# Patient Record
Sex: Female | Born: 1940 | Race: White | Hispanic: No | Marital: Married | Smoking: Never smoker
Health system: Southern US, Community
[De-identification: ages and names within clinical notes are randomized; demographics above are authoritative.]

## PROBLEM LIST (undated history)

## (undated) DIAGNOSIS — J189 Pneumonia, unspecified organism: Secondary | ICD-10-CM

## (undated) DIAGNOSIS — H9190 Unspecified hearing loss, unspecified ear: Secondary | ICD-10-CM

## (undated) DIAGNOSIS — E785 Hyperlipidemia, unspecified: Secondary | ICD-10-CM

## (undated) DIAGNOSIS — T7840XA Allergy, unspecified, initial encounter: Secondary | ICD-10-CM

## (undated) DIAGNOSIS — H3530 Unspecified macular degeneration: Secondary | ICD-10-CM

## (undated) DIAGNOSIS — R109 Unspecified abdominal pain: Secondary | ICD-10-CM

## (undated) DIAGNOSIS — C50919 Malignant neoplasm of unspecified site of unspecified female breast: Secondary | ICD-10-CM

## (undated) DIAGNOSIS — M8580 Other specified disorders of bone density and structure, unspecified site: Secondary | ICD-10-CM

## (undated) DIAGNOSIS — J45909 Unspecified asthma, uncomplicated: Secondary | ICD-10-CM

## (undated) DIAGNOSIS — M1990 Unspecified osteoarthritis, unspecified site: Secondary | ICD-10-CM

## (undated) HISTORY — PX: BREAST SURGERY: SHX581

## (undated) HISTORY — DX: Unspecified macular degeneration: H35.30

## (undated) HISTORY — DX: Unspecified hearing loss, unspecified ear: H91.90

## (undated) HISTORY — PX: MASTECTOMY: SHX3

## (undated) HISTORY — DX: Unspecified abdominal pain: R10.9

## (undated) HISTORY — PX: BREAST ENHANCEMENT SURGERY: SHX7

## (undated) HISTORY — DX: Pneumonia, unspecified organism: J18.9

## (undated) HISTORY — DX: Hyperlipidemia, unspecified: E78.5

## (undated) HISTORY — PX: ABDOMINAL HYSTERECTOMY: SHX81

## (undated) HISTORY — DX: Other specified disorders of bone density and structure, unspecified site: M85.80

## (undated) HISTORY — DX: Malignant neoplasm of unspecified site of unspecified female breast: C50.919

## (undated) HISTORY — DX: Allergy, unspecified, initial encounter: T78.40XA

## (undated) HISTORY — PX: EYE SURGERY: SHX253

## (undated) HISTORY — DX: Unspecified asthma, uncomplicated: J45.909

---

## 1999-01-22 ENCOUNTER — Encounter: Admission: RE | Admit: 1999-01-22 | Discharge: 1999-01-22 | Payer: Self-pay | Admitting: Family Medicine

## 1999-01-22 ENCOUNTER — Encounter: Payer: Self-pay | Admitting: Family Medicine

## 2002-03-12 ENCOUNTER — Encounter: Admission: RE | Admit: 2002-03-12 | Discharge: 2002-03-12 | Payer: Self-pay | Admitting: Family Medicine

## 2002-03-12 ENCOUNTER — Encounter: Payer: Self-pay | Admitting: Family Medicine

## 2003-05-29 ENCOUNTER — Ambulatory Visit (HOSPITAL_COMMUNITY): Admission: RE | Admit: 2003-05-29 | Discharge: 2003-05-29 | Payer: Self-pay | Admitting: Gastroenterology

## 2003-09-01 ENCOUNTER — Encounter: Admission: RE | Admit: 2003-09-01 | Discharge: 2003-09-01 | Payer: Self-pay | Admitting: Family Medicine

## 2004-04-09 ENCOUNTER — Encounter: Admission: RE | Admit: 2004-04-09 | Discharge: 2004-04-09 | Payer: Self-pay | Admitting: Family Medicine

## 2006-05-05 ENCOUNTER — Encounter: Admission: RE | Admit: 2006-05-05 | Discharge: 2006-05-05 | Payer: Self-pay | Admitting: Family Medicine

## 2006-05-26 ENCOUNTER — Encounter: Admission: RE | Admit: 2006-05-26 | Discharge: 2006-05-26 | Payer: Self-pay | Admitting: Family Medicine

## 2007-05-02 ENCOUNTER — Encounter: Admission: RE | Admit: 2007-05-02 | Discharge: 2007-05-02 | Payer: Self-pay | Admitting: Family Medicine

## 2010-02-05 ENCOUNTER — Ambulatory Visit (HOSPITAL_COMMUNITY): Admission: RE | Admit: 2010-02-05 | Discharge: 2010-02-05 | Payer: Self-pay | Admitting: Family Medicine

## 2010-02-22 ENCOUNTER — Encounter: Payer: Self-pay | Admitting: Pulmonary Disease

## 2010-03-10 DIAGNOSIS — G609 Hereditary and idiopathic neuropathy, unspecified: Secondary | ICD-10-CM | POA: Insufficient documentation

## 2010-03-10 DIAGNOSIS — M949 Disorder of cartilage, unspecified: Secondary | ICD-10-CM

## 2010-03-10 DIAGNOSIS — J309 Allergic rhinitis, unspecified: Secondary | ICD-10-CM | POA: Insufficient documentation

## 2010-03-10 DIAGNOSIS — M899 Disorder of bone, unspecified: Secondary | ICD-10-CM | POA: Insufficient documentation

## 2010-03-10 DIAGNOSIS — J4540 Moderate persistent asthma, uncomplicated: Secondary | ICD-10-CM

## 2010-03-10 DIAGNOSIS — H3530 Unspecified macular degeneration: Secondary | ICD-10-CM | POA: Insufficient documentation

## 2010-03-10 DIAGNOSIS — E785 Hyperlipidemia, unspecified: Secondary | ICD-10-CM

## 2010-03-10 DIAGNOSIS — C50919 Malignant neoplasm of unspecified site of unspecified female breast: Secondary | ICD-10-CM | POA: Insufficient documentation

## 2010-03-11 ENCOUNTER — Ambulatory Visit: Payer: Self-pay | Admitting: Pulmonary Disease

## 2010-03-11 DIAGNOSIS — J189 Pneumonia, unspecified organism: Secondary | ICD-10-CM | POA: Insufficient documentation

## 2010-04-02 ENCOUNTER — Encounter: Payer: Self-pay | Admitting: Pulmonary Disease

## 2010-04-02 ENCOUNTER — Encounter
Admission: RE | Admit: 2010-04-02 | Discharge: 2010-04-02 | Payer: Self-pay | Source: Home / Self Care | Attending: Family Medicine | Admitting: Family Medicine

## 2010-04-17 ENCOUNTER — Encounter: Payer: Self-pay | Admitting: Family Medicine

## 2010-04-29 NOTE — Assessment & Plan Note (Signed)
Summary: ASTHMA//TD   Visit Type:  Initial Consult Copy to:  Dr. [name] Primary Provider/Referring Provider:  [name]  CC:  Pulmonary consult....  History of Present Illness: 70 yo female for evaluation of asthma.  She has noticed a problem with a cough since the time she was married.  She was previously told she had the lungs of an old woman.  She had some type of testing and therapy (she does not recall what exactly), and was better for a while.  She was also seen by Dr. Stevphen Rochester for allergies, and was getting allergy shots, but these didn't seem to help.  She has always had trouble with allergies.  She went on a trip to [place], and used nasonex for her sinuses.  This helped quite a bit, and she subsequently stopped allergy shots.  She is no longer seeing Dr. Corinda Gubler.  Her typical pattern is that her allergy symptoms are worse in the Fall.  For the past 10 years she has developed a respiratory infection in the fall.  She has been told she had pneumonia several times.  She was on a trip to the [place] in 2007 and developed a respiratory infection.  She was treated with prednisone and antibiotics, and improved.  She was treated for pneumonia in November 2011.  She was treated with prednisone and antibiotics, and has since improved.  She still has some sore throat, and hasn't fully recovered her energy level.  She will occasional get chest tightness and cough with milky colored sputum.  She denies fever or hemoptysis.  She has not been wheezing much since she took prednisone.  She has been using proair several times per day since she developed pneumonia in November.  She is only using her Qvar once per day.  She uses singulair, astepro, and flonase on a regular basis.  She uses loratadine and nasal irrigation intermittently.  She denies any history of cigarette smoking.  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  She has allergies to cats.  She does not have any  recent animal exposures, or sick exposures.  She has lived in [place] her whole life, and denies recent travel.  No prior history of TB.  She had  pneumonia shot about 5 years ago.  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  Preventive Screening-Counseling & Management  Alcohol-Tobacco     Alcohol drinks/day: 0     Smoking Status: never  Current Medications (verified): 1)  Multivitamins  Tabs (Multiple Vitamin) .Marland Kitchen.. 1 By Mouth Daily 2)  Vitamin D 1000 Unit Tabs (Cholecalciferol) .Marland Kitchen.. 1 By Mouth Daily 3)  Tums E-X 750 750 Mg Chew (Calcium Carbonate Antacid) .Marland Kitchen.. 1 By Mouth Rpn 4)  Fish Oil 1000 Mg Caps (Omega-3 Fatty Acids) .Marland Kitchen.. 1 By Mouth Daily 5)  Ocuvite-Lutein  Caps (Multiple Vitamins-Minerals) .... Daily As Directed 6)  Qvar 80 Mcg/act Aers (Beclomethasone Dipropionate) .Marland Kitchen.. 1 Puff At Bedtime 7)  Singulair 10 Mg Tabs (Montelukast Sodium) .Marland Kitchen.. 1 By Mouth At Bedtime 8)  Fluticasone Propionate 50 Mcg/act Susp (Fluticasone Propionate) .Marland Kitchen.. 1 Spray in Each Nostril At Bedtime 9)  Loratadine 10 Mg Tabs (Loratadine) .Marland Kitchen.. 1 By Mouth Daily 10)  Astepro 0.15 % Soln (Azelastine Hcl) .Marland Kitchen.. 1 Spray in Each Nostril At Bedtime 11)  Mucinex 600 Mg Xr12h-Tab (Guaifenesin) .Marland Kitchen.. 1 By Mouth Two Times A Day 12)  Proair Hfa 108 (90 Base) Mcg/act Aers (Albuterol Sulfate) .Marland Kitchen.. 1 Puff Two Times A Day  Allergies (verified): 1)  ! Sulfa  Past History:  Past Medical History: Asthma Allergic rhinits Osteopenia Breast cancer 1982 Macular degeneration Hyperlipidemia  Past Surgical History: Mastectomy, bilateral C-section x two  TAH with BSO  Family History: Father - CAD Paternal Aunt - Breast Cancer  Social History: Married, two children.  Retired Runner, broadcasting/film/video.  Never smoked.Alcohol drinks/day:  0 Smoking Status:  never  Review of Systems       The patient complains of shortness of breath with activity, shortness of breath at rest, productive cough, indigestion, and sore throat.  The  patient denies non-productive cough, coughing up blood, chest pain, irregular heartbeats, acid heartburn, loss of appetite, weight change, abdominal pain, difficulty swallowing, tooth/dental problems, headaches, nasal congestion/difficulty breathing through nose, sneezing, itching, ear ache, anxiety, depression, hand/feet swelling, joint stiffness or pain, rash, change in color of mucus, and fever.    Vital Signs:  Patient profile:   70 year old female Height:      60 inches (152.40 cm) Weight:      139 pounds (63.18 kg) BMI:     27.24 O2 Sat:      95 % on Room air Temp:     98.0 degrees F (36.67 degrees C) oral Pulse rate:   96 / minute BP sitting:   124 / 74  (left arm) Cuff size:   regular  Vitals Entered By: Michel Bickers CMA (March 11, 2010 9:57 AM)  O2 Flow:  Room air CC: Pulmonary consult... Is Patient Diabetic? No Comments Medications reviewed with patient Michel Bickers [facility]  March 11, 2010 9:58 AM   Physical Exam  General:  normal appearance and healthy appearing.   Eyes:  PERRLA and EOMI, wears glasses Ears:  TMs intact and clear with normal canals Nose:  clear nasal discharge.   Mouth:  MP 3, scalloped tongue Neck:  no JVD.   Chest Wall:  b/l mastectomy Lungs:  clear bilaterally to auscultation and percussion Heart:  regular rate and rhythm, S1, S2 without murmurs, rubs, gallops, or clicks Abdomen:  bowel sounds positive; abdomen soft and non-tender without masses, or organomegaly Msk:  no deformity or scoliosis noted with normal posture Pulses:  pulses normal Extremities:  no clubbing, cyanosis, edema, or deformity noted Neurologic:  normal CN II-XII and strength normal.   Skin:  intact without lesions or rashes Cervical Nodes:  no significant adenopathy Psych:  alert and cooperative; normal mood and affect; normal attention span and concentration   Impression & Recommendations:  Problem # 1:  ASTHMA (ICD-493.90) She has a long standing history of asthma  related to allergies.  She was treated for a recent exacerbation, and has improved.    I have advised her to use Qvar one puff two times a day.  She is to continue her other asthma regimen.  I don't think she needs additional prednisone or antibiotics at this time.  I reviewed for her the difference between her inhalers, and the proper role for each.  Also, now that she has recovered from her acute illness, I have administered flu vaccine today.  Will also need to have pulmonary function testing done at her next visit to establish baseline lung function.  Problem # 2:  ALLERGIC RHINITIS (ICD-477.9) She is stable on her current regimen.  Problem # 3:  PNEUMONIA, ORGANISM UNSPECIFIED (ICD-486)  She was recently treated for pneumonia with antibiotics and prednisone.  She has improved clinically.  She is due to have f/u chest xray with primary care.  I have asked that a copy of her xray for my review.  Medications Added to Medication List This Visit: 1)  Qvar 80 Mcg/act Aers (Beclomethasone dipropionate) .... One puff two times a day 2)  Proair Hfa 108 (90 Base) Mcg/act Aers (Albuterol sulfate) .Marland Kitchen.. 1 puff two times a day 3)  Fluticasone Propionate 50 Mcg/act Susp (Fluticasone propionate) .Marland Kitchen.. 1 spray in each nostril at bedtime 4)  Astepro 0.15 % Soln (Azelastine hcl) .Marland Kitchen.. 1 spray in each nostril at bedtime 5)  Mucinex 600 Mg Xr12h-tab (Guaifenesin) .Marland Kitchen.. 1 by mouth two times a day  Complete Medication List: 1)  Qvar 80 Mcg/act Aers (Beclomethasone dipropionate) .... One puff two times a day 2)  Singulair 10 Mg Tabs (Montelukast sodium) .Marland Kitchen.. 1 by mouth at bedtime 3)  Proair Hfa 108 (90 Base) Mcg/act Aers (Albuterol sulfate) .Marland Kitchen.. 1 puff two times a day 4)  Fluticasone Propionate 50 Mcg/act Susp (Fluticasone propionate) .Marland Kitchen.. 1 spray in each nostril at bedtime 5)  Astepro 0.15 % Soln (Azelastine hcl) .Marland Kitchen.. 1 spray in each nostril at bedtime 6)  Loratadine 10 Mg Tabs (Loratadine) .Marland Kitchen.. 1 by mouth  daily 7)  Multivitamins Tabs (Multiple vitamin) .Marland Kitchen.. 1 by mouth daily 8)  Vitamin D 1000 Unit Tabs (Cholecalciferol) .Marland Kitchen.. 1 by mouth daily 9)  Tums E-x 750 750 Mg Chew (Calcium carbonate antacid) .Marland Kitchen.. 1 by mouth rpn 10)  Fish Oil 1000 Mg Caps (Omega-3 fatty acids) .Marland Kitchen.. 1 by mouth daily 11)  Ocuvite-lutein Caps (Multiple vitamins-minerals) .... Daily as directed 12)  Mucinex 600 Mg Xr12h-tab (Guaifenesin) .Marland Kitchen.. 1 by mouth two times a day  Other Orders: Consultation Level IV (16109) Influenza Vaccine MCR (60454)  Patient Instructions: 1)  Qvar one puff two times a day, and rinse mouth after using 2)  Ask Dr. Clelia Croft to send copy of follow up chest xray 3)  Follow up in 4 months   Immunization History:  Pneumovax Immunization History:    Pneumovax:  historical (03/28/2004)  Immunizations Administered:  Influenza Vaccine # 1:    Vaccine Type: Fluvax MCR    Site: right deltoid    Mfr: GlaxoSmithKline    Dose: 0.5 ml    Route: IM    Given by: Michel Bickers CMA    Exp. Date: 09/25/2010    Lot #: [id number]  Flu Vaccine Consent Questions:    Do you have a history of severe allergic reactions to this vaccine? no    Any prior history of allergic reactions to egg and/or gelatin? no    Do you have a sensitivity to the preservative Thimersol? no    Do you have a past history of Guillan-Barre Syndrome? no    Do you currently have an acute febrile illness? no    Have you ever had a severe reaction to latex? no    Vaccine information given and explained to patient? yes    Are you currently pregnant? no

## 2010-04-29 NOTE — Letter (Signed)
Summary: [name] at [facility] at [facility]   Imported By: Lester [place] 03/25/2010 12:22:01  _____________________________________________________________________  External Attachment:    Type:   Image     Comment:   External Document

## 2010-08-13 NOTE — Op Note (Signed)
NAMEAMAYRANY, CAFARO ANN                             ACCOUNT NO.:  [id number]   MEDICAL RECORD NO.:  [id number]                   PATIENT TYPE:  AMB   LOCATION:  ENDO                                 FACILITY:  [facility]   PHYSICIAN:  Danise Edge, M.D.                DATE OF BIRTH:  01-24-1941   DATE OF PROCEDURE:  05/29/2003  DATE OF DISCHARGE:                                 OPERATIVE REPORT   PROCEDURE:  Screening colonoscopy.   INDICATIONS:  Mrs. Mickayla Trouten is a 70 year old female, born March 10, 1941.  Mrs. Mccaffery is scheduled to undergo her first screening colonoscopy  with polypectomy to prevent colon cancer.   ENDOSCOPIST:  Danise Edge, M.D.   PREMEDICATION:  Versed 6 mg, Demerol 60 mg.   DESCRIPTION OF PROCEDURE:  After obtaining informed consent, Mrs. Landess was  placed in the left lateral decubitus position.  I administered intravenous  Demerol and intravenous Versed to achieve conscious sedation for the  procedure.  The patient's blood pressure, oxygen saturation and cardiac  rhythm were monitored throughout the procedure and documented in the medical  record.   Anal inspection was normal.  Digital rectal exam was normal.  The Olympus  adjustable pediatric  colonoscope was introduced into the rectum and  advanced to the cecum.  Colonic preparation for the exam today was  excellent.  Rectum:  Normal.  Sigmoid colon and descending colon:  Normal.  Splenic flexure:  Normal.  Transverse colon:  Normal.  Hepatic flexure:  Normal.  Ascending colon:  Normal.  Cecum and ileocecal valve:  Normal.   ASSESSMENT:  Normal screening proctocolonoscopy to the cecum.  No endoscopic  evidence for the presence of colorectal neoplasia.                                               Danise Edge, M.D.    MJ/MEDQ  D:  05/29/2003  T:  05/29/2003  Job:  [id number]   cc:   Donia Guiles, M.D.  [address] [place]  [place] [address]  Fax: [phone]

## 2010-10-24 ENCOUNTER — Emergency Department (HOSPITAL_COMMUNITY): Payer: Medicare HMO

## 2010-10-24 ENCOUNTER — Emergency Department (HOSPITAL_COMMUNITY)
Admission: EM | Admit: 2010-10-24 | Discharge: 2010-10-24 | Disposition: A | Discharge status: Home / Self Care | Payer: Medicare HMO | Attending: Emergency Medicine | Admitting: Emergency Medicine

## 2010-10-24 DIAGNOSIS — W172XXA Fall into hole, initial encounter: Secondary | ICD-10-CM | POA: Insufficient documentation

## 2010-10-24 DIAGNOSIS — S82899A Other fracture of unspecified lower leg, initial encounter for closed fracture: Secondary | ICD-10-CM | POA: Insufficient documentation

## 2010-10-24 DIAGNOSIS — S82409A Unspecified fracture of shaft of unspecified fibula, initial encounter for closed fracture: Secondary | ICD-10-CM | POA: Insufficient documentation

## 2011-10-18 ENCOUNTER — Other Ambulatory Visit: Payer: Self-pay | Admitting: Family Medicine

## 2011-10-18 ENCOUNTER — Ambulatory Visit
Admission: RE | Admit: 2011-10-18 | Discharge: 2011-10-18 | Disposition: A | Discharge status: Home / Self Care | Payer: Medicare HMO | Source: Ambulatory Visit | Attending: Family Medicine | Admitting: Family Medicine

## 2011-10-18 DIAGNOSIS — R042 Hemoptysis: Secondary | ICD-10-CM

## 2011-11-14 ENCOUNTER — Other Ambulatory Visit: Payer: Self-pay | Admitting: Family Medicine

## 2011-11-14 DIAGNOSIS — Z1231 Encounter for screening mammogram for malignant neoplasm of breast: Secondary | ICD-10-CM

## 2011-12-06 ENCOUNTER — Ambulatory Visit
Admission: RE | Admit: 2011-12-06 | Discharge: 2011-12-06 | Disposition: A | Discharge status: Home / Self Care | Payer: Medicare HMO | Source: Ambulatory Visit | Attending: Family Medicine | Admitting: Family Medicine

## 2011-12-06 DIAGNOSIS — Z1231 Encounter for screening mammogram for malignant neoplasm of breast: Secondary | ICD-10-CM

## 2011-12-07 ENCOUNTER — Ambulatory Visit (INDEPENDENT_AMBULATORY_CARE_PROVIDER_SITE_OTHER): Payer: Medicare HMO | Admitting: Pulmonary Disease

## 2011-12-07 ENCOUNTER — Encounter: Payer: Self-pay | Admitting: Pulmonary Disease

## 2011-12-07 VITALS — BP 112/70 | HR 95 | Temp 98.0°F | Ht 60.0 in | Wt 140.8 lb

## 2011-12-07 DIAGNOSIS — J45909 Unspecified asthma, uncomplicated: Secondary | ICD-10-CM

## 2011-12-07 DIAGNOSIS — R0600 Dyspnea, unspecified: Secondary | ICD-10-CM

## 2011-12-07 DIAGNOSIS — R0609 Other forms of dyspnea: Secondary | ICD-10-CM

## 2011-12-07 DIAGNOSIS — Z23 Encounter for immunization: Secondary | ICD-10-CM

## 2011-12-07 NOTE — Progress Notes (Signed)
Chief Complaint  Patient presents with  . Follow-up    Pt c/o SOB with exertion and bending over x 2weeks. Pt states that she has gained about 5lbs in the last 3 months. Wants to know if this could have anything to do with her symptoms.   CC: [name]  History of Present Illness: [name] is a 71 y.o. female with asthma.  I last saw her in December 2011.  She has noticed more trouble with her breathing over the past several weeks.  This only happens with exertion.  She also notices a rubber band like feeling in her chest when she gets short of breath.  She has been using proair twice per day, and tried a course of prednisone.  Neither of these made much difference with her breathing.  She continues to use Qvar one puff twice per day, and singulair at night.  She is not having cough, wheeze, sputum, sinus congestion, or sore throat.  She does not feel like her current symptoms fit with her usual pattern for asthma flare.   Past Medical History  Diagnosis Date  . Cancer of breast     33 years ago  . Asthma   . Macular degeneration   . Hearing loss     Past Surgical History  Procedure Date  . Mastectomy     bilateral  . Abdominal hysterectomy   . Cesarean section     x 2  . Breast enhancement surgery     Outpatient Encounter Prescriptions as of 12/07/2011  Medication Sig Dispense Refill  . albuterol (PROVENTIL HFA;VENTOLIN HFA) 108 (90 BASE) MCG/ACT inhaler Inhale 2 puffs into the lungs every 6 (six) hours as needed.      Marland Kitchen azelastine (ASTELIN) 137 MCG/SPRAY nasal spray Place 1 spray into the nose 2 (two) times daily. Use in each nostril as directed      . beclomethasone (QVAR) 80 MCG/ACT inhaler Inhale 2 puffs into the lungs as needed.      . calcium carbonate (TUMS) 500 MG chewable tablet Chew 1 tablet by mouth daily.      . Cholecalciferol (VITAMIN D) 1000 UNITS capsule Take 1,000 Units by mouth daily.      . fexofenadine (ALLEGRA) 180 MG tablet Take 180 mg by mouth  daily.      . fish oil-omega-3 fatty acids 1000 MG capsule Take 1,000 mg by mouth daily.      Marland Kitchen FLUTICASONE PROPIONATE NA Place 2 sprays into the nose daily.      . montelukast (SINGULAIR) 10 MG tablet Take 10 mg by mouth at bedtime.      . Multiple Vitamin (MULTIVITAMIN WITH MINERALS) TABS Take 1 tablet by mouth daily.      . raloxifene (EVISTA) 60 MG tablet Take 60 mg by mouth daily.        Allergies  Allergen Reactions  . Sulfonamide Derivatives     REACTION: rash    Physical Exam:  Filed Vitals:   12/07/11 1512  BP: 112/70  Pulse: 95  Temp: 98 F (36.7 C)  TempSrc: Oral  Height: 5' (1.524 m)  Weight: 140 lb 12.8 oz (63.866 kg)  SpO2: 95%    Current Encounter SPO2  12/07/11 1512 95%  03/11/10 0925 95%     Body mass index is 27.50 kg/(m^2). Wt Readings from Last 2 Encounters:  12/07/11 140 lb 12.8 oz (63.866 kg)  03/11/10 139 lb (63.05 kg)    General - No distress ENT -  TM clear, no sinus tenderness, no oral exudate, no LAN, no thyromegaly Cardiac - s1s2 regular, no murmur, pulses symmetric, no edema Chest - normal respiratory excursion, good air entry, no wheeze/rales/dullness Back - no focal tenderness Abd - soft, non-tender, no organomegaly, + bowel sounds Ext - normal motor strength Neuro - Cranial nerves are normal. PERLA. EOM's intact. Skin - no discernible active dermatitis, erythema, urticaria or inflammatory process. Psych - normal mood, and behavior.  CHEST - 2 VIEW 10/18/11 Comparison: Chest x-ray 04/02/2010.  Findings: Areas of architectural distortion are again noted in the lingula and right middle lobe (similar to prior) with some evidence of chronic volume loss in the right middle lobe and lingula; these findings partially obscure portions of the right the left cardiac border. No acute consolidative airspace disease. No pleural effusions. Pulmonary vasculature and the cardiomediastinal silhouette are within normal limits. Atherosclerotic  calcifications are noted within the arch of the aorta.  IMPRESSION:  1. Chronic scarring and volume loss in the right middle lobe and lingula similar to prior examinations. No definite radiographic evidence of acute cardiopulmonary disease.  2. Atherosclerosis  Original Report Authenticated By: Florencia Reasons, M.D.  Spirometry 12/07/11>>1.31 (71%), FEV1% 66  Exhaled nitric oxide 12/07/11>>8 ppb (normal)  Assessment/Plan:  Kendra Helling, MD [facility] Pager:  [phone] 12/07/2011, 3:31 PM

## 2011-12-07 NOTE — Assessment & Plan Note (Signed)
She has recent onset of exertional dyspnea with chest discomfort.  This is not her stereotypical pattern for asthma exacerbation.  She has not noticed improvement with prednisone or inhaler therapy.  Normal exhaled nitric oxide also speaks against asthma as a cause of her symptoms.  I have advised her to discuss with her PCP about whether she needs additional cardiac evaluation.  She does have family history of CAD.

## 2011-12-07 NOTE — Patient Instructions (Signed)
Flu shot today Speak with Dr. Clelia Croft about whether you need an evaluation of your heart function Follow up in 6 weeks

## 2011-12-07 NOTE — Assessment & Plan Note (Signed)
Continue her current asthma regimen for now.

## 2011-12-16 ENCOUNTER — Encounter: Payer: Self-pay | Admitting: Pulmonary Disease

## 2012-01-19 ENCOUNTER — Encounter (HOSPITAL_COMMUNITY): Payer: Self-pay | Admitting: Pharmacy Technician

## 2012-01-20 ENCOUNTER — Encounter: Payer: Self-pay | Admitting: Cardiology

## 2012-01-20 ENCOUNTER — Other Ambulatory Visit: Payer: Self-pay | Admitting: Cardiology

## 2012-01-20 NOTE — H&P (Signed)
Office Visit     Patient: [name], [name] Provider: Armanda Magic, MD  DOB: Jul 02, 1940   Age: 71 Y   Sex: Female Date: 01/19/2012  Phone: [phone]  Address: [address], [place], [id number]  Pcp: Andria Meuse SHAW    --------------------------------------------------------------------------------  Subjective:    CC:      1. TT/per Dr. Kandee Keen chest tightness and SOB.      HPI:     General:           The patient presents today for evaluation of Chest pain and SOB. She has a family history of CAD. The Chest pain has been occurring for about 2 months and she first noticed it when she was out walking. She also noticed SOB at the same time. She noticed than several times when she would go out to walk. She describes the chest discomfort as a tightness that occurs even at rest. She says that she will sometimes feel weakness and sometimes feel like she is going to pass out. The chest discomfort occurs daily to some degree. She says that last Saturday she was watching TV and around 5PM felt jittery and had problems breathing. She got up and walked around and got dizzy. The jitteriness she describes as an uncomfortable sensation in her chest associated with SOB and occasionally diaphoresis. Her nuclear stress test showed no ischemia with normal LVF. She says that as soon as she gets up in the am she immediately is SOB when she starts to move around. Even taking a shower is difficult due to SOB. Her symptoms improve when she rests..      ROS:      See HPI, A twelve system review was perfomed at today's visit. For pertinent positives and negatives see HPI.     Medical History: Asthma, Osteopenia, Breast CA- R- 1982, allergies - Dr. Corinda Gubler, Macular degeneration, HLD, Hearing loss with aids.      Surgical History: double mastectomy for breast cancer , TAH with BSO , c/s x 2 .      Family History:  Father: deceased 59 yrs first MI at 35 Mother: alive 90s yrs mac degeneration, hearing  Brother 1: alive prostate cancer 1 brother(s) .      Social History:      General:          History of smoking  cigarettes: Never smoked.           no Alcohol.          no Recreational drug use.          no Exercise.          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          Marital Status: married, Renae Fickle.          Children: 2 kids, 2 grandkids.      Medications: Multi For Her 50+ Tablet 1 tablet once a day, Lutein Vision Blend Capsule , Loratadine 10 MG Tablet 1 tablet Once a day, Fluticasone Propionate 50 MCG/ACT Suspension 2 sprays Once a day, Fish Oil 1000 MG Capsule 1 tablet once a day, Vitamin D 1000 UNIT Tablet 1 tablet once a day, Qvar 80 MCG/ACT Aerosol Solution 1 puff Twice a day, ProAir HFA 108 (90 Base) MCG/ACT Aerosol Solution 2 puffs as needed every 4 hrs as needed, Astelin 137 MCG/SPRAY Solution 1 puff in each nostril Twice a day, Tums E-X 750 750 MG Tablet Chewable 1 tablet as needed once a day,  Evista 60 MG Tablet 1 tablet Once a day, Singulair 10 MG Tablet 1 tablet in the evening Once a day, Medication List reviewed and reconciled with the patient     Allergies: Sulfa drugs (for allergy): rash: Allergy, fosamax: GI side effects.      Objective:    Vitals: Wt 141.6, Wt change .4 lb, Ht 61 1/2, BMI 26.32, Pulse sitting 110, BP sitting 130/82.     Examination:     Cardiology, General:         GENERAL APPEARANCE: pleasant, NAD.          HEENT: unremarkable.          CAROTID UPSTROKE: normal, no bruit.          JVD: flat.          HEART SOUNDS: regular, normal S1, S2, no S3 or S4.          MURMUR: absent.          LUNGS: no rales or wheezes.          ABDOMEN: soft, non tender, positive bowel sounds, no masses felt.          EXTREMITIES: no leg edema.          PERIPHERAL PULSES: 2 plus bilateral.            Assessment:    Assessment:  1. Chest pain - 786.50 (Primary)   2. SOB (shortness of breath) - 786.05     Plan:    1. Chest pain        LAB: CBC with  Diff      WBC 11.2 4.0-11.0 - K/ul H       RBC 4.21 4.20-5.40 - M/uL        HGB 12.4 12.0-16.0 - g/dL        HCT 40.9 81.1-91.4 - %        MCH 29.4 27.0-33.0 - pg        MPV 9.6 7.5-10.7 - fL        MCV 93.2 81.0-99.0 - fL         MCHC 31.5 32.0-36.0 - g/dL L       RDW 78.2 95.6-21.3 - %        NRBC# 0.05 -        PLT 316 150-400 - K/uL        NEUT % 54.0 43.3-71.9 - %        NRBC% 0.40 - %        LYMPH% 32.9 16.8-43.5 - %        MONO % 8.8 4.6-12.4 - %        EOS % 3.0 0.0-7.8 - %         BASO % 1.3 0.0-1.0 - % H       NEUT # 6.1 1.9-7.2 - K/uL         LYMPH# 3.70 1.10-2.70 - K/uL H        MONO # 1.0 0.3-0.8 - K/uL H       EOS # 0.3 0.0-0.6 - K/uL         BASO # 0.2 0.0-0.1 - K/uL H              Chrisanna Mishra M 01/19/2012 03:28:37 PM > elevated WBC please forward to primary MD fpr review. Corson,Danielle 01/19/2012 04:52:05 PM > Pt aware VIa VM per DPR. Sending to PCP. SHAW,KIMBERLEE D 01/19/2012 04:57:01 PM > noted  LAB: Basic Metabolic  Stable      GLUCOSE 295 70-99 - mg/dL H       BUN 12 1-88 - mg/dL        CREATININE 4.16 0.60-1.30 - mg/dl        eGFR (NON-AFRICAN AMERICAN) 72 >60 - calc        eGFR (AFRICAN AMERICAN) 87 >60 - calc        SODIUM 140 136-145 - mmol/L        POTASSIUM 3.7 3.5-5.5 - mmol/L        CHLORIDE 104 98-107 - mmol/L        C02 30 22-32 - mmol/L        ANION GAP 10.2 6.0-20.0 - mmol/L        CALCIUM 9.6 8.6-10.3 - mg/dL               Oziah Vitanza M 01/19/2012 04:15:14 PM > nonfasting elevated BS otherwise normal Corson,Danielle 01/19/2012 04:52:05 PM > Pt aware VIa VM per DPR. Sending to PCP.        LAB: PT (Prothrombin Time) (606301)  Normal     Prothrombin Time 10.6 9.1-12.0 - SEC        INR 1.0 0.8-1.2 -               Dorothymae Maciver M 01/20/2012 08:42:09 AM >   Diagnostic Imaging:EKG NSR, Corson,Danielle 01/19/2012 01:15:56 PM > Yovany Clock M 01/19/2012 01:45:24 PM >     Given her ongoing symptoms of chest discomfort and SOB with exertion  despite a normal nuclear stress test - I have recommended that we proceed with cardiac cath , Risks and benefits of cardiac catheterization have been reviewed including risk of stroke, heart attack, death, bleeding, renal impariment and arterial damage. There was ample oppurtuny to answer questions. Alternatives were discussed. Patient understands and wishes to proceed.          Immunizations:       Labs:      Procedure Codes: 60109 EKG I AND R, 85025 ECL CBC PLATELET DIFF, 80048 ECL BMP, 32355 BLOOD COLLECTION ROUTINE VENIPUNCTURE     Preventive:           Follow Up: Cath        Provider: Armanda Magic, MD  Patient: [name], [name]  DOB: [birth year]  Date: 01/19/2012

## 2012-01-23 ENCOUNTER — Ambulatory Visit (INDEPENDENT_AMBULATORY_CARE_PROVIDER_SITE_OTHER): Payer: Medicare HMO | Admitting: Pulmonary Disease

## 2012-01-23 ENCOUNTER — Encounter: Payer: Self-pay | Admitting: Pulmonary Disease

## 2012-01-23 VITALS — BP 138/78 | HR 93 | Temp 97.5°F | Ht 60.0 in | Wt 142.0 lb

## 2012-01-23 DIAGNOSIS — R0609 Other forms of dyspnea: Secondary | ICD-10-CM

## 2012-01-23 DIAGNOSIS — J45909 Unspecified asthma, uncomplicated: Secondary | ICD-10-CM

## 2012-01-23 DIAGNOSIS — R0600 Dyspnea, unspecified: Secondary | ICD-10-CM

## 2012-01-23 DIAGNOSIS — R0989 Other specified symptoms and signs involving the circulatory and respiratory systems: Secondary | ICD-10-CM

## 2012-01-23 NOTE — Assessment & Plan Note (Signed)
Continue her current asthma regimen for now. 

## 2012-01-23 NOTE — Patient Instructions (Signed)
Will get copy of stress test and Echo results from Dr. Norris Cross office Follow up in 2 months

## 2012-01-23 NOTE — Addendum Note (Signed)
Addended by: Armanda Magic on: 01/23/2012 09:21 AM   Modules accepted: Orders

## 2012-01-23 NOTE — Assessment & Plan Note (Addendum)
She has persistent dyspnea with exertion.  I don't think this is from her asthma.  She is scheduled for heart catheterization with cardiology.  If this is inconclusive, then will need additional testing possibly with CT chest and cardiopulmonary stress testing.  Will get copy of her stress test and Echo results.

## 2012-01-23 NOTE — Progress Notes (Signed)
Chief Complaint  Patient presents with  . Follow-up    breathing is worse from last OV, wheezing this AM. pt is scheduled to have heart cath tomorrow morning.    CC: [name]  History of Present Illness: [name] is a 71 y.o. female with asthma.  She continues to get short of breath with exertion.  She had stress test and Echo with Dr. Mayford Knife.  These were inconclusive.  She is scheduled for heart catheterization later this week.  She had an episode of cough and wheeze from her throat this morning.  Prior to this she has not been getting cough, wheeze, or sputum.  She denies sinus congestion or post-nasal drip.  Tests: CXR 10/18/11>>Chronic scarring and volume loss in the right middle lobe and lingula similar to prior examinations Spirometry 12/07/11>>1.31 (71%), FEV1% 66 Exhaled nitric oxide 12/07/11>>8 ppb (normal)  Past Medical History  Diagnosis Date  . Cancer of breast     33 years ago  . Asthma   . Macular degeneration   . Hearing loss   . Osteopenia     Past Surgical History  Procedure Date  . Mastectomy     bilateral  . Abdominal hysterectomy   . Cesarean section     x 2  . Breast enhancement surgery     Outpatient Encounter Prescriptions as of 01/23/2012  Medication Sig Dispense Refill  . albuterol (PROVENTIL HFA;VENTOLIN HFA) 108 (90 BASE) MCG/ACT inhaler Inhale 2 puffs into the lungs every 6 (six) hours as needed. For shortness of breath      . azelastine (ASTELIN) 137 MCG/SPRAY nasal spray Place 1 spray into the nose 2 (two) times daily. Use in each nostril as directed      . beclomethasone (QVAR) 80 MCG/ACT inhaler Inhale 2 puffs into the lungs as needed. For shortness of breath      . calcium carbonate (TUMS) 500 MG chewable tablet Chew 1 tablet by mouth daily.      . Cholecalciferol (VITAMIN D) 1000 UNITS capsule Take 1,000 Units by mouth daily.      . fexofenadine (ALLEGRA) 180 MG tablet Take 180 mg by mouth daily.      . fish oil-omega-3 fatty  acids 1000 MG capsule Take 1,000 mg by mouth daily.      Marland Kitchen FLUTICASONE PROPIONATE NA Place 2 sprays into the nose daily.      . montelukast (SINGULAIR) 10 MG tablet Take 10 mg by mouth at bedtime.      . Multiple Vitamin (MULTIVITAMIN WITH MINERALS) TABS Take 1 tablet by mouth daily.      . raloxifene (EVISTA) 60 MG tablet Take 60 mg by mouth daily.        Allergies  Allergen Reactions  . Fosamax (Alendronate Sodium) Nausea Only  . Sulfonamide Derivatives     REACTION: rash    Physical Exam:  Filed Vitals:   01/23/12 0916  BP: 138/78  Pulse: 93  Temp: 97.5 F (36.4 C)  TempSrc: Oral  Height: 5' (1.524 m)  Weight: 142 lb (64.411 kg)  SpO2: 98%    Current Encounter SPO2  01/23/12 0916 98%  12/07/11 1512 95%  03/11/10 0925 95%    Body mass index is 27.73 kg/(m^2). Wt Readings from Last 2 Encounters:  01/23/12 142 lb (64.411 kg)  12/07/11 140 lb 12.8 oz (63.866 kg)    General - No distress ENT - No sinus tenderness, no oral exudate, no LAN Cardiac - s1s2 regular, no murmur  Chest - normal respiratory excursion, good air entry, no wheeze/rales/dullness Back - no focal tenderness Abd - soft, non-tender Ext - no edema Neuro - normal strength Skin - no rashes Psych - normal mood, and behavior.   Assessment/Plan:  Coralyn Helling, MD [facility] Pager:  [phone] 01/23/2012, 9:22 AM

## 2012-01-24 ENCOUNTER — Ambulatory Visit (HOSPITAL_COMMUNITY)
Admission: RE | Admit: 2012-01-24 | Discharge: 2012-01-24 | Disposition: A | Discharge status: Home / Self Care | Payer: Medicare HMO | Source: Ambulatory Visit | Attending: Cardiology | Admitting: Cardiology

## 2012-01-24 ENCOUNTER — Encounter (HOSPITAL_COMMUNITY)
Admission: RE | Disposition: A | Discharge status: Home / Self Care | Payer: Self-pay | Source: Ambulatory Visit | Attending: Cardiology

## 2012-01-24 DIAGNOSIS — R0600 Dyspnea, unspecified: Secondary | ICD-10-CM

## 2012-01-24 DIAGNOSIS — R0609 Other forms of dyspnea: Secondary | ICD-10-CM | POA: Insufficient documentation

## 2012-01-24 DIAGNOSIS — R0989 Other specified symptoms and signs involving the circulatory and respiratory systems: Secondary | ICD-10-CM | POA: Insufficient documentation

## 2012-01-24 DIAGNOSIS — R0602 Shortness of breath: Secondary | ICD-10-CM | POA: Diagnosis not present

## 2012-01-24 DIAGNOSIS — R0789 Other chest pain: Secondary | ICD-10-CM | POA: Insufficient documentation

## 2012-01-24 DIAGNOSIS — Z8249 Family history of ischemic heart disease and other diseases of the circulatory system: Secondary | ICD-10-CM | POA: Insufficient documentation

## 2012-01-24 DIAGNOSIS — R079 Chest pain, unspecified: Secondary | ICD-10-CM | POA: Diagnosis not present

## 2012-01-24 HISTORY — PX: LEFT HEART CATHETERIZATION WITH CORONARY ANGIOGRAM: SHX5451

## 2012-01-24 SURGERY — LEFT HEART CATHETERIZATION WITH CORONARY ANGIOGRAM
Anesthesia: LOCAL

## 2012-01-24 MED ORDER — SODIUM CHLORIDE 0.9 % IJ SOLN: 3.0000 mL | Freq: Two times a day (BID) | INTRAMUSCULAR | Status: DC

## 2012-01-24 MED ORDER — SODIUM CHLORIDE 0.9 % IJ SOLN: 3.0000 mL | INTRAMUSCULAR | Status: DC | PRN

## 2012-01-24 MED ORDER — SODIUM CHLORIDE 0.9 % IV SOLN: 250.0000 mL | INTRAVENOUS | Status: DC | PRN

## 2012-01-24 MED ORDER — NITROGLYCERIN 0.2 MG/ML ON CALL CATH LAB
INTRAVENOUS | Status: AC
  Filled 2012-01-24: qty 1

## 2012-01-24 MED ORDER — DIAZEPAM 5 MG PO TABS
5.0000 mg | ORAL_TABLET | ORAL | Status: AC
  Administered 2012-01-24: 5 mg via ORAL

## 2012-01-24 MED ORDER — DIAZEPAM 5 MG PO TABS
ORAL_TABLET | ORAL | Status: AC
  Filled 2012-01-24: qty 1

## 2012-01-24 MED ORDER — ASPIRIN 81 MG PO CHEW
324.0000 mg | CHEWABLE_TABLET | ORAL | Status: AC
  Administered 2012-01-24: 324 mg via ORAL

## 2012-01-24 MED ORDER — MIDAZOLAM HCL 2 MG/2ML IJ SOLN
INTRAMUSCULAR | Status: AC
  Filled 2012-01-24: qty 2

## 2012-01-24 MED ORDER — SODIUM CHLORIDE 0.9 % IV SOLN
INTRAVENOUS | Status: DC
  Administered 2012-01-24: 09:00:00 via INTRAVENOUS

## 2012-01-24 MED ORDER — LIDOCAINE HCL (PF) 1 % IJ SOLN
INTRAMUSCULAR | Status: AC
  Filled 2012-01-24: qty 30

## 2012-01-24 MED ORDER — ASPIRIN 81 MG PO CHEW
CHEWABLE_TABLET | ORAL | Status: AC
  Filled 2012-01-24: qty 4

## 2012-01-24 MED ORDER — FENTANYL CITRATE 0.05 MG/ML IJ SOLN
INTRAMUSCULAR | Status: AC
  Filled 2012-01-24: qty 2

## 2012-01-24 NOTE — CV Procedure (Signed)
PROCEDURE:  Left heart catheterization with selective coronary angiography, left ventriculogram.  INDICATIONS:  Dyspnea on exertion and chest tightness  The risks, benefits, and details of the procedure were explained to the patient.  The patient verbalized understanding and wanted to proceed.  Informed written consent was obtained.  PROCEDURE TECHNIQUE:  After Xylocaine anesthesia a 22F sheath was placed in the right femoral artery with a single anterior needle wall stick.   Left coronary angiography was done using a Judkins L4 guide catheter.  Right coronary angiography was done using a Judkins R4 guide catheter.  Left ventriculography was done using a pigtail catheter.    CONTRAST:  Total of 50 cc.  COMPLICATIONS:  None.    HEMODYNAMICS:  Aortic pressure was 137/41mmHg; LV pressure was 139/50mmHg ; LVEDP .  There was no gradient between the left ventricle and aorta.    ANGIOGRAPHIC DATA:   The left main coronary artery is widely patent and bifurcates into an LAD and left circumflex artery.  The left anterior descending artery is calcified but widely patent.  It give rise to a first diagonal which is small in caliber and patent.  The ongoing LAD is patent and gives rise to a second diagonal which is small but patent.    The left circumflex artery is widely patent.  It gives rise to a high OM1 which is patent and birfurcates into 2 daughter branches which are patent.  The left circ is widely patent throughout its course and gives rise to a second moderate sized OM2 branch which is patent.  It then gives rise to an OM3 which is small and patent and an OM4 which is moderate in size and patent.  The ongoing left circumflex is widely patent.    The right coronary artery is widely patent throughout its course with a some luminal irregularities in the proximal portion.  LEFT VENTRICULOGRAM:  Left ventricular angiogram was done in the 30 RAO projection and revealed normal left ventricular wall  motion and systolic function with an estimated ejection fraction of 55%.  LVEDP was 8 mmHg.  IMPRESSIONS:  1. Normal left main coronary artery. 2. Normal left anterior descending artery and its branches. 3. Normal left circumflex artery and its branches. 4. Normal right coronary artery with luminal irregularities 5. Normal left ventricular systolic function.  LVEDP 8 mmHg.  Ejection fraction 55%.  RECOMMENDATION:   1.  D/C home after IVF and bedrest complete 2.  Followup with my NP in 2 weeks for grion check 3.  Followup with primary MD for further workup of SOB.

## 2012-01-24 NOTE — Interval H&P Note (Signed)
History and Physical Interval Note:  01/24/2012 10:39 AM  [name]  has presented today for surgery, with the diagnosis of cp  The various methods of treatment have been discussed with the patient and family. After consideration of risks, benefits and other options for treatment, the patient has consented to  Procedure(s) (LRB) with comments: LEFT HEART CATHETERIZATION WITH CORONARY ANGIOGRAM (N/A) as a surgical intervention .  The patient's history has been reviewed, patient examined, no change in status, stable for surgery.  I have reviewed the patient's chart and labs.  Questions were answered to the patient's satisfaction.     TURNER,TRACI R

## 2012-01-24 NOTE — H&P (View-Only) (Signed)
Office Visit     Patient: [name] [name] [name] Provider: [name], MD  DOB: 03/05/1941   Age: 71 Y   Sex: Female Date: 01/19/2012  Phone: [phone]  Address: [address], [place], [place]  Pcp: [name] [name]    --------------------------------------------------------------------------------  Subjective:    CC:      1. TT/per Dr. Turner-ongoing chest tightness and SOB.      HPI:     General:           The patient presents today for evaluation of Chest pain and SOB. She has a family history of CAD. The Chest pain has been occurring for about [name] months and she first noticed it when she was out walking. She also noticed SOB at the same time. She noticed than several times when she would go out to walk. She describes the chest discomfort as a tightness that occurs even at rest. She says that she will sometimes feel weakness and sometimes feel like she is going to pass out. The chest discomfort occurs daily to some degree. She says that last Saturday she was watching TV and around 5PM felt jittery and had problems breathing. She got up and walked around and got dizzy. The jitteriness she describes as an uncomfortable sensation in her chest associated with SOB and occasionally diaphoresis. Her nuclear stress test showed no ischemia with normal LVF. She says that as soon as she gets up in the am she immediately is SOB when she starts to move around. Even taking a shower is difficult due to SOB. Her symptoms improve when she rests..      ROS:      See HPI, A twelve system review was perfomed at today'[name] visit. For pertinent positives and negatives see HPI.     Medical History: Asthma, Osteopenia, Breast CA- R- 1982, allergies - [name] [name], Macular degeneration, HLD, Hearing loss with aids.      Surgical History: double mastectomy for breast cancer , TAH with BSO , c/[name] [name] .      Family History:  Father: deceased 87 yrs first MI at 60 Mother: alive 90s yrs mac degeneration, hearing  Brother 1: alive prostate cancer 1 brother([name]) .      Social History:      General:          History of smoking  cigarettes: Never smoked.           no Alcohol.          no Recreational drug use.          no Exercise.          Occupation: retired teacher high school history.          Marital Status: married, [name].          Children: [name] kids, [name] grandkids.      Medications: Multi For Her 50+ Tablet 1 tablet once a day, Lutein Vision Blend Capsule , Loratadine 10 MG Tablet 1 tablet Once a day, Fluticasone Propionate 50 MCG/ACT Suspension [name] sprays Once a day, Fish Oil 1000 MG Capsule 1 tablet once a day, Vitamin [name] 1000 UNIT Tablet 1 tablet once a day, Qvar 80 MCG/ACT Aerosol Solution 1 puff Twice a day, ProAir HFA 108 (90 Base) MCG/ACT Aerosol Solution [name] puffs as needed every 4 hrs as needed, Astelin 137 MCG/SPRAY Solution 1 puff in each nostril Twice a day, Tums E-X 750 750 MG Tablet Chewable 1 tablet as needed once a day,   Evista 60 MG Tablet 1 tablet Once a day, Singulair 10 MG Tablet 1 tablet in the evening Once a day, Medication List reviewed and reconciled with the patient     Allergies: Sulfa drugs (for allergy): rash: Allergy, fosamax: GI side effects.      Objective:    Vitals: Wt 141.6, Wt change .4 lb, Ht 61 1/[name], BMI 26.32, Pulse sitting 110, BP sitting 130/82.     Examination:     Cardiology, General:         GENERAL APPEARANCE: pleasant, NAD.          HEENT: unremarkable.          CAROTID UPSTROKE: normal, no bruit.          JVD: flat.          HEART SOUNDS: regular, normal S1, S2, no S3 or S4.          MURMUR: absent.          LUNGS: no rales or wheezes.          ABDOMEN: soft, non tender, positive bowel sounds, no masses felt.          EXTREMITIES: no leg edema.          PERIPHERAL PULSES: [name] plus bilateral.            Assessment:    Assessment:  1. Chest pain - 786.50 (Primary)   [name]. SOB (shortness of breath) - 786.05     Plan:    1. Chest pain        LAB: CBC with  Diff      WBC 11.[name] 4.0-11.0 - K/ul H       RBC 4.21 4.20-5.40 - [name]/uL        HGB 12.4 12.0-16.0 - g/dL        HCT 39.3 37.0-47.0 - %        MCH 29.4 27.0-33.0 - pg        MPV 9.6 7.5-10.7 - fL        MCV 93.[name] 81.0-99.0 - fL         MCHC 31.5 32.0-36.0 - g/dL L       RDW 12.8 11.5-15.5 - %        NRBC# 0.05 -        PLT 316 150-400 - K/uL        NEUT % 54.0 43.3-71.9 - %        NRBC% 0.40 - %        LYMPH% 32.9 16.8-43.5 - %        MONO % 8.8 4.6-12.4 - %        EOS % 3.0 0.0-7.8 - %         BASO % 1.3 0.0-1.0 - % H       NEUT # 6.1 1.9-7.[name] - K/uL         LYMPH# 3.70 1.10-[name].70 - K/uL H        MONO # 1.0 0.3-0.8 - K/uL H       EOS # 0.3 0.0-0.6 - K/uL         BASO # 0.[name] 0.0-0.1 - K/uL H              [name] [name] 01/19/2012 03:28:37 PM > elevated WBC please forward to primary MD fpr review. [name] [name] 01/19/2012 04:52:05 PM > Pt aware VIa VM per DPR. Sending to PCP. [name] [name] 01/19/2012 04:57:01 PM > noted          LAB: Basic Metabolic  Stable      GLUCOSE 126 70-99 - mg/dL H       BUN 12 6-26 - mg/dL        CREATININE 0.79 0.60-1.30 - mg/dl        eGFR (NON-AFRICAN AMERICAN) 72 >60 - calc        eGFR (AFRICAN AMERICAN) 87 >60 - calc        SODIUM 140 136-145 - mmol/L        POTASSIUM 3.7 3.5-5.5 - mmol/L        CHLORIDE 104 98-107 - mmol/L        C02 30 22-32 - mmol/L        ANION GAP 10.[name] 6.0-20.0 - mmol/L        CALCIUM 9.6 8.6-10.3 - mg/dL               [name] [name] 01/19/2012 04:15:14 PM > nonfasting elevated BS otherwise normal [name] [name] 01/19/2012 04:52:05 PM > Pt aware VIa VM per DPR. Sending to PCP.        LAB: PT (Prothrombin Time) (005199)  Normal     Prothrombin Time 10.6 9.1-12.0 - SEC        INR 1.0 0.8-1.[name] -               [name] [name] 01/20/2012 08:42:09 AM >   Diagnostic Imaging:EKG NSR, [name] [name] 01/19/2012 01:15:56 PM > [name] [name] 01/19/2012 01:45:24 PM >     Given her ongoing symptoms of chest discomfort and SOB with exertion  despite a normal nuclear stress test - I have recommended that we proceed with cardiac cath , Risks and benefits of cardiac catheterization have been reviewed including risk of stroke, heart attack, death, bleeding, renal impariment and arterial damage. There was ample oppurtuny to answer questions. Alternatives were discussed. Patient understands and wishes to proceed.          Immunizations:       Labs:      Procedure Codes: 93000 EKG I AND R, 85025 ECL CBC PLATELET DIFF, 80048 ECL BMP, 36415 BLOOD COLLECTION ROUTINE VENIPUNCTURE     Preventive:           Follow Up: Cath        Provider: [name], MD  Patient: [name] [name] [name]  DOB: 06/05/1940  Date: 01/19/2012   

## 2012-04-10 ENCOUNTER — Ambulatory Visit: Payer: Medicare HMO | Admitting: Pulmonary Disease

## 2012-04-19 ENCOUNTER — Ambulatory Visit (INDEPENDENT_AMBULATORY_CARE_PROVIDER_SITE_OTHER): Payer: Medicare HMO | Admitting: Pulmonary Disease

## 2012-04-19 ENCOUNTER — Encounter: Payer: Self-pay | Admitting: Pulmonary Disease

## 2012-04-19 VITALS — BP 140/82 | HR 104 | Temp 97.4°F | Ht 60.0 in | Wt 137.6 lb

## 2012-04-19 DIAGNOSIS — J45990 Exercise induced bronchospasm: Secondary | ICD-10-CM

## 2012-04-19 DIAGNOSIS — J309 Allergic rhinitis, unspecified: Secondary | ICD-10-CM | POA: Insufficient documentation

## 2012-04-19 DIAGNOSIS — R0982 Postnasal drip: Secondary | ICD-10-CM

## 2012-04-19 DIAGNOSIS — J45909 Unspecified asthma, uncomplicated: Secondary | ICD-10-CM

## 2012-04-19 HISTORY — DX: Exercise induced bronchospasm: J45.990

## 2012-04-19 MED ORDER — ALBUTEROL SULFATE HFA 108 (90 BASE) MCG/ACT IN AERS: 2.0000 | INHALATION_SPRAY | Freq: Four times a day (QID) | RESPIRATORY_TRACT | Status: DC | PRN

## 2012-04-19 NOTE — Assessment & Plan Note (Signed)
She is slowly recovering from recent sinus infection with bronchitis.  She is to continue nasal irrigation, allegra, and astelin.

## 2012-04-19 NOTE — Patient Instructions (Signed)
Will arrange for home nebulizer Proair two puffs as needed for cough, wheeze, or chest congestion Follow up in 6 months

## 2012-04-19 NOTE — Assessment & Plan Note (Signed)
She is to continue Qvar, and singulair.  Will change her rescue inhaler from proventil to proair.  Will arrange for home nebulizer.  I don't think she needs additional prednisone or antibiotics at this time.

## 2012-04-19 NOTE — Progress Notes (Signed)
Chief Complaint  Patient presents with  . Follow-up    asthma is worse since cold weather. c/o cough w/ clear phlem, wheezing, blowing green-yellow phlem, nasal congestion, PND, ear pressure. no fever/c/s/nv just finshed abx augmentin and prednisone    CC: [name]  History of Present Illness: [name] is a 72 y.o. female never smoker with asthma.  Her breathing was doing okay until around Christmas time.  She developed a bronchitis.  She was seen by a physician in [place], and given augmentin, prednisone, and nebulizer.  This helped.  She still has sinus congestion with post-nasal drip, and this contributes to her cough.  She has been using proventil once per day, but this does not seem to help.  She had tried other albuterol inhalers before which worked better than proventil.  She also feels nebulized albuterol worked well when she was having more congestion.  Prior to this episode last month she was doing better with her breathing except in cold, dry weather.  She would also noticed problems with chest tightness and cough when she would exercise.  She denies sinus pressure, headache, sore throat, fever, chest pain, or hemoptysis.  Tests: CXR 10/18/11>>Chronic scarring and volume loss in the right middle lobe and lingula similar to prior examinations Spirometry 12/07/11>>1.31 (71%), FEV1% 66 Exhaled nitric oxide 12/07/11>>8 ppb (normal) Lt heart cath 01/24/12 >> Normal coronaries, LVEDP 8 mmHg, EF 55%  Past Medical History  Diagnosis Date  . Cancer of breast     33 years ago  . Asthma   . Macular degeneration   . Hearing loss   . Osteopenia     Past Surgical History  Procedure Date  . Mastectomy     bilateral  . Abdominal hysterectomy   . Cesarean section     x 2  . Breast enhancement surgery     Outpatient Encounter Prescriptions as of 04/19/2012  Medication Sig Dispense Refill  . albuterol (PROVENTIL) (2.5 MG/3ML) 0.083% nebulizer solution Take 2.5 mg by  nebulization every 6 (six) hours as needed.      Marland Kitchen azelastine (ASTELIN) 137 MCG/SPRAY nasal spray Place 1 spray into the nose 2 (two) times daily. Use in each nostril as directed      . beclomethasone (QVAR) 80 MCG/ACT inhaler Inhale 2 puffs into the lungs as needed. For shortness of breath      . calcium carbonate (TUMS) 500 MG chewable tablet Chew 1 tablet by mouth daily.      . Cholecalciferol (VITAMIN D) 1000 UNITS capsule Take 1,000 Units by mouth daily.      . fexofenadine (ALLEGRA) 180 MG tablet Take 180 mg by mouth daily.      . fish oil-omega-3 fatty acids 1000 MG capsule Take 1,000 mg by mouth daily.      Marland Kitchen FLUTICASONE PROPIONATE NA Place 2 sprays into the nose daily.      . montelukast (SINGULAIR) 10 MG tablet Take 10 mg by mouth at bedtime.      . Multiple Vitamin (MULTIVITAMIN WITH MINERALS) TABS Take 1 tablet by mouth daily.      . raloxifene (EVISTA) 60 MG tablet Take 60 mg by mouth daily.      . [DISCONTINUED] albuterol (PROVENTIL HFA;VENTOLIN HFA) 108 (90 BASE) MCG/ACT inhaler Inhale 2 puffs into the lungs every 6 (six) hours as needed. For shortness of breath      . albuterol (PROAIR HFA) 108 (90 BASE) MCG/ACT inhaler Inhale 2 puffs into the lungs every  6 (six) hours as needed for wheezing.  1 Inhaler  3    Allergies  Allergen Reactions  . Fosamax (Alendronate Sodium) Nausea Only  . Sulfonamide Derivatives     REACTION: rash    Physical Exam:  Filed Vitals:   04/19/12 0917  BP: 140/82  Pulse: 104  Temp: 97.4 F (36.3 C)  TempSrc: Oral  Height: 5' (1.524 m)  Weight: 137 lb 9.6 oz (62.415 kg)  SpO2: 98%    Current Encounter SPO2  04/19/12 0917 98%  01/24/12 0844 97%  01/24/12 0844 97%    Body mass index is 26.87 kg/(m^2).   Wt Readings from Last 2 Encounters:  04/19/12 137 lb 9.6 oz (62.415 kg)  01/24/12 140 lb (63.504 kg)    General - No distress ENT - No sinus tenderness, clear nasal discharge, TM clear, no oral exudate, no LAN Cardiac - s1s2  regular, no murmur Chest - normal respiratory excursion, good air entry, no wheeze/rales/dullness Back - no focal tenderness Abd - soft, non-tender Ext - no edema Neuro - normal strength Skin - no rashes Psych - normal mood, and behavior  Assessment/Plan:  Coralyn Helling, MD [facility]/Sleep Pager:  [phone] 04/19/2012, 9:43 AM

## 2012-04-19 NOTE — Assessment & Plan Note (Signed)
Discussed triggers for this, the importance of warm ups before exercise, and pre-treating with her inhaler before exercise.

## 2012-04-26 ENCOUNTER — Telehealth: Payer: Self-pay | Admitting: Pulmonary Disease

## 2012-04-26 MED ORDER — ALBUTEROL SULFATE (2.5 MG/3ML) 0.083% IN NEBU: 2.5000 mg | INHALATION_SOLUTION | Freq: Four times a day (QID) | RESPIRATORY_TRACT | Status: DC | PRN

## 2012-04-26 MED ORDER — AMOXICILLIN-POT CLAVULANATE 875-125 MG PO TABS: 1.0000 | ORAL_TABLET | Freq: Two times a day (BID) | ORAL | Status: DC

## 2012-04-26 NOTE — Telephone Encounter (Signed)
I spoke with pt and is aware. She voiced her understanding and needed nothing further.

## 2012-04-26 NOTE — Telephone Encounter (Signed)
Please inform pt that I have sent script for augmentin.  She is to take one pill twice daily for 7 days.  She is to refill script and take for another week if she is not improved after 1st week of antibiotics.

## 2012-04-26 NOTE — Telephone Encounter (Signed)
I spoke with pt. She stated the day after her OV she started having eye pain, teeth pain, nose was swollen shut, having terrible time sleeping, she is blowing out green/bloddy mucus. The saline nasal spray is not helping. She is also using Astelin/flonase and those are not helping either. She is requesting further recs. Please advise Dr. Craige Cotta thanks  Allergies  Allergen Reactions  . Fosamax (Alendronate Sodium) Nausea Only  . Sulfonamide Derivatives     REACTION: rash

## 2012-10-17 ENCOUNTER — Ambulatory Visit (INDEPENDENT_AMBULATORY_CARE_PROVIDER_SITE_OTHER)
Admission: RE | Admit: 2012-10-17 | Discharge: 2012-10-17 | Disposition: A | Discharge status: Home / Self Care | Payer: Medicare HMO | Source: Ambulatory Visit | Attending: Pulmonary Disease | Admitting: Pulmonary Disease

## 2012-10-17 ENCOUNTER — Ambulatory Visit (INDEPENDENT_AMBULATORY_CARE_PROVIDER_SITE_OTHER): Payer: Medicare HMO | Admitting: Pulmonary Disease

## 2012-10-17 ENCOUNTER — Encounter: Payer: Self-pay | Admitting: Pulmonary Disease

## 2012-10-17 VITALS — BP 110/72 | HR 94 | Temp 98.1°F | Ht 60.0 in | Wt 138.0 lb

## 2012-10-17 DIAGNOSIS — J45909 Unspecified asthma, uncomplicated: Secondary | ICD-10-CM

## 2012-10-17 DIAGNOSIS — R0600 Dyspnea, unspecified: Secondary | ICD-10-CM

## 2012-10-17 DIAGNOSIS — R0609 Other forms of dyspnea: Secondary | ICD-10-CM

## 2012-10-17 DIAGNOSIS — J4540 Moderate persistent asthma, uncomplicated: Secondary | ICD-10-CM

## 2012-10-17 DIAGNOSIS — R0989 Other specified symptoms and signs involving the circulatory and respiratory systems: Secondary | ICD-10-CM

## 2012-10-17 DIAGNOSIS — R0982 Postnasal drip: Secondary | ICD-10-CM

## 2012-10-17 NOTE — Assessment & Plan Note (Signed)
She is to continue astelin, allegra, and flonase.

## 2012-10-17 NOTE — Patient Instructions (Signed)
Chest xray today Will schedule breathing test (PFT) Follow up in 6 weeks

## 2012-10-17 NOTE — Progress Notes (Signed)
Chief Complaint  Patient presents with  . Asthma    Breathing is unchanged. Reports SOB. Denies chest tightness, coughing or wheezing.    CC: [name]  History of Present Illness: [name] is a 72 y.o. female never smoker with asthma.  She continues to have trouble with her breathing.  Spring time was especially difficulty for her.  She feels like she can't get enough air in through her nose.  She needs to take deep breaths to get her air.  She will sometimes feel like she is gasping.  She has trouble carrying anything while going up stairs.  She gets winded if she bends over.   She is not having much cough, wheeze, or sputum.  Her sinuses are not congested.  She denies chest pain, palpitations, or leg swelling.  Tests: CXR 10/18/11>>Chronic scarring and volume loss in the right middle lobe and lingula similar to prior examinations Spirometry 12/07/11>>1.31 (71%), FEV1% 66 Exhaled nitric oxide 12/07/11>>8 ppb (normal) Lt heart cath 01/24/12 >> Normal coronaries, LVEDP 8 mmHg, EF 55%  She  has a past medical history of Cancer of breast; Asthma; Macular degeneration; Hearing loss; and Osteopenia.  She  has past surgical history that includes Mastectomy; Abdominal hysterectomy; Cesarean section; and Breast enhancement surgery.   Outpatient Encounter Prescriptions as of 10/17/2012  Medication Sig Dispense Refill  . albuterol (PROAIR HFA) 108 (90 BASE) MCG/ACT inhaler Inhale 2 puffs into the lungs every 6 (six) hours as needed for wheezing.  1 Inhaler  3  . albuterol (PROVENTIL) (2.5 MG/3ML) 0.083% nebulizer solution Take 3 mLs (2.5 mg total) by nebulization every 6 (six) hours as needed. DX 493.90  360 mL  5  . azelastine (ASTELIN) 137 MCG/SPRAY nasal spray Place 1 spray into the nose 2 (two) times daily. Use in each nostril as directed      . beclomethasone (QVAR) 80 MCG/ACT inhaler Inhale 2 puffs into the lungs as needed. For shortness of breath      . calcium carbonate (TUMS) 500  MG chewable tablet Chew 1 tablet by mouth daily.      . Cholecalciferol (VITAMIN D) 1000 UNITS capsule Take 1,000 Units by mouth daily.      . fexofenadine (ALLEGRA) 180 MG tablet Take 180 mg by mouth daily.      . fish oil-omega-3 fatty acids 1000 MG capsule Take 1,000 mg by mouth daily.      Marland Kitchen FLUTICASONE PROPIONATE NA Place 2 sprays into the nose daily.      . montelukast (SINGULAIR) 10 MG tablet Take 10 mg by mouth at bedtime.      . Multiple Vitamin (MULTIVITAMIN WITH MINERALS) TABS Take 1 tablet by mouth daily.      . raloxifene (EVISTA) 60 MG tablet Take 60 mg by mouth daily.      . [DISCONTINUED] amoxicillin-clavulanate (AUGMENTIN) 875-125 MG per tablet Take 1 tablet by mouth 2 (two) times daily.  14 tablet  1   No facility-administered encounter medications on file as of 10/17/2012.    Allergies  Allergen Reactions  . Fosamax (Alendronate Sodium) Nausea Only  . Sulfonamide Derivatives     REACTION: rash    Physical Exam:  General - No distress ENT - No sinus tenderness, no nasal discharge, scalloped tongue, no oral exudate, no LAN Cardiac - s1s2 regular, no murmur Chest - no wheeze Back - no focal tenderness Abd - soft, non-tender Ext - no edema Neuro - normal strength Skin - no rashes  Psych - normal mood, and behavior  Assessment/Plan:  Coralyn Helling, MD [facility]/Sleep Pager:  [phone] 10/17/2012, 2:18 PM

## 2012-10-17 NOTE — Assessment & Plan Note (Signed)
She is to continue Qvar, singulair, and prn albuterol.

## 2012-10-17 NOTE — Assessment & Plan Note (Signed)
Her current symptoms do not fit with typical asthma symptoms.  Will repeat chest xray and PFT to further assess.

## 2012-11-02 ENCOUNTER — Telehealth: Payer: Self-pay | Admitting: Pulmonary Disease

## 2012-11-02 NOTE — Telephone Encounter (Signed)
lmtcb

## 2012-11-02 NOTE — Telephone Encounter (Signed)
Dg Chest 2 View  10/17/2012   *RADIOLOGY REPORT*  Clinical Data: Asthma.  Progressive dyspnea.  Nonsmoker.  CHEST - 2 VIEW  Comparison: 10/18/2011.  Findings: No significant change in the right middle lobe and lingular scarring.  Thickening of the fissures unchanged.  No infiltrate, congestive heart failure or pneumothorax.  Mildly tortuous aorta.  Heart size within normal limits.  Breast prosthesis in place.  IMPRESSION: No significant change in the right middle lobe and lingular scarring.  Thickening of the fissures unchanged.  No infiltrate, congestive heart failure or pneumothorax.  Mildly tortuous aorta.   Original Report Authenticated By: Lacy Duverney, M.D.   Will have my nurse inform pt that CXR shows stable scarring >> likely from previous lung infections.  No new findings.  No change to current treatment plan.

## 2012-11-05 NOTE — Telephone Encounter (Signed)
atc na

## 2012-11-05 NOTE — Telephone Encounter (Signed)
Patient returning call.

## 2012-11-05 NOTE — Telephone Encounter (Signed)
lmtcb x2 

## 2012-11-07 ENCOUNTER — Encounter: Payer: Self-pay | Admitting: *Deleted

## 2012-11-07 NOTE — Telephone Encounter (Signed)
Pt has not returned my call regarding her results. A letter will be sent to her to contact our office.

## 2012-11-12 ENCOUNTER — Telehealth: Payer: Self-pay | Admitting: Pulmonary Disease

## 2012-11-12 NOTE — Telephone Encounter (Signed)
Will have my nurse inform pt that CXR shows stable scarring >> likely from previous lung infections. No new findings. No change to current treatment plan.  Pt advised. Carron Curie, CMA

## 2012-11-12 NOTE — Telephone Encounter (Signed)
Please call patient at -- [phone]

## 2012-12-05 ENCOUNTER — Encounter: Payer: Self-pay | Admitting: Pulmonary Disease

## 2012-12-05 ENCOUNTER — Ambulatory Visit (INDEPENDENT_AMBULATORY_CARE_PROVIDER_SITE_OTHER): Payer: Medicare HMO | Admitting: Pulmonary Disease

## 2012-12-05 VITALS — BP 112/70 | HR 90 | Ht 59.5 in | Wt 132.0 lb

## 2012-12-05 DIAGNOSIS — J309 Allergic rhinitis, unspecified: Secondary | ICD-10-CM

## 2012-12-05 DIAGNOSIS — J4540 Moderate persistent asthma, uncomplicated: Secondary | ICD-10-CM

## 2012-12-05 DIAGNOSIS — J45909 Unspecified asthma, uncomplicated: Secondary | ICD-10-CM

## 2012-12-05 DIAGNOSIS — R0609 Other forms of dyspnea: Secondary | ICD-10-CM

## 2012-12-05 DIAGNOSIS — R0600 Dyspnea, unspecified: Secondary | ICD-10-CM

## 2012-12-05 DIAGNOSIS — R0681 Apnea, not elsewhere classified: Secondary | ICD-10-CM

## 2012-12-05 LAB — PULMONARY FUNCTION TEST

## 2012-12-05 NOTE — Progress Notes (Signed)
Chief Complaint  Patient presents with  . Shortness of Breath    Breathing has been doing well. Had PFT done. States that she feels better after she was given albuterol.    CC: [name]  History of Present Illness: [name] is a 72 y.o. female never smoker with asthma.  Her breathing has been good over the summer.  She is not getting much wheeze, or cough.  She is not having difficulty with her sinuses.  She had PFT today >> this showed mild obstruction with small airway disease, borderline bronchodilator response, normal lung volumes and normal diffusion.  She has noticed trouble feeling like her breathing gets shallow when she is sitting quiet or sleeping.  Her husband has commented that she stops breathing at times when asleep.  Tests: CXR 10/18/11>>Chronic scarring and volume loss in the right middle lobe and lingula similar to prior examinations Spirometry 12/07/11>>1.31 (71%), FEV1% 66 Exhaled nitric oxide 12/07/11>>8 ppb (normal) Lt heart cath 01/24/12 >> Normal coronaries, LVEDP 8 mmHg, EF 55% PFT 12/05/12 >> FEV1 1.49 (82%), FEV1% 69, FEF 25-75% 1.09 (69%), TLC 4.38 (85%), DLCO 87%, borderline BD response  She  has a past medical history of Cancer of breast; Asthma; Macular degeneration; Hearing loss; and Osteopenia.  She  has past surgical history that includes Mastectomy; Abdominal hysterectomy; Cesarean section; and Breast enhancement surgery.   Outpatient Encounter Prescriptions as of 12/05/2012  Medication Sig Dispense Refill  . albuterol (PROAIR HFA) 108 (90 BASE) MCG/ACT inhaler Inhale 2 puffs into the lungs every 6 (six) hours as needed for wheezing.  1 Inhaler  3  . albuterol (PROVENTIL) (2.5 MG/3ML) 0.083% nebulizer solution Take 3 mLs (2.5 mg total) by nebulization every 6 (six) hours as needed. DX 493.90  360 mL  5  . azelastine (ASTELIN) 137 MCG/SPRAY nasal spray Place 1 spray into the nose 2 (two) times daily. Use in each nostril as directed      .  beclomethasone (QVAR) 80 MCG/ACT inhaler Inhale 2 puffs into the lungs as needed. For shortness of breath      . calcium carbonate (TUMS) 500 MG chewable tablet Chew 1 tablet by mouth daily.      . Cholecalciferol (VITAMIN D) 1000 UNITS capsule Take 1,000 Units by mouth daily.      . fexofenadine (ALLEGRA) 180 MG tablet Take 180 mg by mouth daily.      . fish oil-omega-3 fatty acids 1000 MG capsule Take 1,000 mg by mouth daily.      Marland Kitchen FLUTICASONE PROPIONATE NA Place 2 sprays into the nose daily.      . meloxicam (MOBIC) 15 MG tablet Take 15 mg by mouth daily.      . montelukast (SINGULAIR) 10 MG tablet Take 10 mg by mouth at bedtime.      . Multiple Vitamin (MULTIVITAMIN WITH MINERALS) TABS Take 1 tablet by mouth daily.      . raloxifene (EVISTA) 60 MG tablet Take 60 mg by mouth daily.       No facility-administered encounter medications on file as of 12/05/2012.    Allergies  Allergen Reactions  . Fosamax [Alendronate Sodium] Nausea Only  . Sulfonamide Derivatives     REACTION: rash    Physical Exam:  General - No distress ENT - No sinus tenderness, no nasal discharge, scalloped tongue, no oral exudate, no LAN Cardiac - s1s2 regular, no murmur Chest - no wheeze Back - no focal tenderness Abd - soft, non-tender Ext -  no edema Neuro - normal strength Skin - no rashes Psych - normal mood, and behavior  Assessment/Plan:  Coralyn Helling, MD [facility] Pager:  [phone] 12/05/2012, 11:48 AM

## 2012-12-05 NOTE — Assessment & Plan Note (Signed)
Stable on current regimen   

## 2012-12-05 NOTE — Assessment & Plan Note (Signed)
She has symptoms suggestive of sleep apnea.  She is not convinced she has a sleep problem.  She would like to monitor her sleep pattern before considering additional testing.

## 2012-12-05 NOTE — Patient Instructions (Signed)
Follow up in 6 months 

## 2012-12-05 NOTE — Progress Notes (Signed)
PFT done today. 

## 2013-07-10 ENCOUNTER — Ambulatory Visit: Payer: Self-pay | Admitting: Ophthalmology

## 2013-07-16 ENCOUNTER — Ambulatory Visit: Payer: Self-pay | Admitting: Ophthalmology

## 2013-10-01 ENCOUNTER — Ambulatory Visit: Payer: Self-pay | Admitting: Ophthalmology

## 2014-02-26 ENCOUNTER — Other Ambulatory Visit: Payer: Self-pay | Admitting: Gastroenterology

## 2014-03-06 ENCOUNTER — Encounter (HOSPITAL_COMMUNITY): Payer: Self-pay | Admitting: Cardiology

## 2014-03-28 LAB — HM COLONOSCOPY

## 2014-04-07 ENCOUNTER — Encounter (HOSPITAL_COMMUNITY): Payer: Self-pay | Admitting: *Deleted

## 2014-04-08 ENCOUNTER — Other Ambulatory Visit: Payer: Self-pay | Admitting: Gastroenterology

## 2014-04-16 DIAGNOSIS — Z961 Presence of intraocular lens: Secondary | ICD-10-CM | POA: Diagnosis not present

## 2014-04-19 NOTE — Anesthesia Preprocedure Evaluation (Addendum)
Anesthesia Evaluation  Patient identified by MRN, date of birth, ID band Patient awake    Reviewed: Allergy & Precautions, NPO status , Patient's Chart, lab work & pertinent test results, reviewed documented beta blocker date and time   Airway Mallampati: II       Dental  (+) Teeth Intact, Dental Advisory Given   Pulmonary asthma (uses inhalers) ,  breath sounds clear to auscultation        Cardiovascular  ECHO 2013 EF 71%   Neuro/Psych negative neurological ROS  negative psych ROS   GI/Hepatic negative GI ROS, Neg liver ROS,   Endo/Other  negative endocrine ROS  Renal/GU negative Renal ROS     Musculoskeletal   Abdominal (+) + obese,   Peds  Hematology negative hematology ROS (+)   Anesthesia Other Findings   Reproductive/Obstetrics                            Anesthesia Physical Anesthesia Plan  ASA: II  Anesthesia Plan: MAC   Post-op Pain Management:    Induction: Intravenous  Airway Management Planned: Nasal Cannula  Additional Equipment:   Intra-op Plan:   Post-operative Plan:   Informed Consent: I have reviewed the patients History and Physical, chart, labs and discussed the procedure including the risks, benefits and alternatives for the proposed anesthesia with the patient or authorized representative who has indicated his/her understanding and acceptance.     Plan Discussed with:   Anesthesia Plan Comments:         Anesthesia Quick Evaluation

## 2014-04-21 ENCOUNTER — Encounter (HOSPITAL_COMMUNITY)
Admission: RE | Disposition: A | Discharge status: Home / Self Care | Payer: Medicare HMO | Source: Ambulatory Visit | Attending: Gastroenterology

## 2014-04-21 ENCOUNTER — Encounter (HOSPITAL_COMMUNITY): Payer: Self-pay | Admitting: Anesthesiology

## 2014-04-21 ENCOUNTER — Ambulatory Visit (HOSPITAL_COMMUNITY): Payer: Commercial Managed Care - HMO | Admitting: Anesthesiology

## 2014-04-21 ENCOUNTER — Ambulatory Visit (HOSPITAL_COMMUNITY)
Admission: RE | Admit: 2014-04-21 | Discharge: 2014-04-21 | Disposition: A | Discharge status: Home / Self Care | Payer: Commercial Managed Care - HMO | Source: Ambulatory Visit | Attending: Gastroenterology | Admitting: Gastroenterology

## 2014-04-21 DIAGNOSIS — D123 Benign neoplasm of transverse colon: Secondary | ICD-10-CM | POA: Insufficient documentation

## 2014-04-21 DIAGNOSIS — J45909 Unspecified asthma, uncomplicated: Secondary | ICD-10-CM | POA: Insufficient documentation

## 2014-04-21 DIAGNOSIS — M8580 Other specified disorders of bone density and structure, unspecified site: Secondary | ICD-10-CM | POA: Insufficient documentation

## 2014-04-21 DIAGNOSIS — K635 Polyp of colon: Secondary | ICD-10-CM | POA: Diagnosis not present

## 2014-04-21 DIAGNOSIS — Z1211 Encounter for screening for malignant neoplasm of colon: Secondary | ICD-10-CM | POA: Diagnosis not present

## 2014-04-21 HISTORY — DX: Unspecified osteoarthritis, unspecified site: M19.90

## 2014-04-21 HISTORY — PX: COLONOSCOPY WITH PROPOFOL: SHX5780

## 2014-04-21 SURGERY — COLONOSCOPY WITH PROPOFOL
Anesthesia: Monitor Anesthesia Care

## 2014-04-21 MED ORDER — ONDANSETRON HCL 4 MG/2ML IJ SOLN
INTRAMUSCULAR | Status: DC | PRN
Start: 2014-04-21 — End: 2014-04-21
  Administered 2014-04-21: 4 mg via INTRAVENOUS

## 2014-04-21 MED ORDER — SODIUM CHLORIDE 0.9 % IJ SOLN
INTRAMUSCULAR | Status: AC
  Filled 2014-04-21: qty 10

## 2014-04-21 MED ORDER — ONDANSETRON HCL 4 MG/2ML IJ SOLN
INTRAMUSCULAR | Status: AC
  Filled 2014-04-21: qty 2

## 2014-04-21 MED ORDER — PROPOFOL 10 MG/ML IV BOLUS
INTRAVENOUS | Status: AC
  Filled 2014-04-21: qty 20

## 2014-04-21 MED ORDER — EPHEDRINE SULFATE 50 MG/ML IJ SOLN
INTRAMUSCULAR | Status: AC
  Filled 2014-04-21: qty 1

## 2014-04-21 MED ORDER — MEPERIDINE HCL 100 MG/ML IJ SOLN: 6.2500 mg | INTRAMUSCULAR | Status: DC | PRN

## 2014-04-21 MED ORDER — KETAMINE HCL 10 MG/ML IJ SOLN
INTRAMUSCULAR | Status: DC | PRN
  Administered 2014-04-21: 20 mg via INTRAVENOUS

## 2014-04-21 MED ORDER — LACTATED RINGERS IV SOLN
INTRAVENOUS | Status: DC | PRN
  Administered 2014-04-21: 07:00:00 via INTRAVENOUS

## 2014-04-21 MED ORDER — SODIUM CHLORIDE 0.9 % IV SOLN: INTRAVENOUS | Status: DC

## 2014-04-21 MED ORDER — LIDOCAINE HCL (CARDIAC) 20 MG/ML IV SOLN
INTRAVENOUS | Status: AC
  Filled 2014-04-21: qty 5

## 2014-04-21 MED ORDER — PROPOFOL INFUSION 10 MG/ML OPTIME
INTRAVENOUS | Status: DC | PRN
  Administered 2014-04-21: 150 ug/kg/min via INTRAVENOUS

## 2014-04-21 MED ORDER — KETAMINE HCL 10 MG/ML IJ SOLN
INTRAMUSCULAR | Status: AC
  Filled 2014-04-21: qty 1

## 2014-04-21 MED ORDER — LACTATED RINGERS IV SOLN: INTRAVENOUS | Status: DC

## 2014-04-21 MED ORDER — FENTANYL CITRATE 0.05 MG/ML IJ SOLN: 25.0000 ug | INTRAMUSCULAR | Status: DC | PRN

## 2014-04-21 MED ORDER — PROMETHAZINE HCL 25 MG/ML IJ SOLN: 6.2500 mg | INTRAMUSCULAR | Status: DC | PRN

## 2014-04-21 SURGICAL SUPPLY — 21 items

## 2014-04-21 NOTE — Anesthesia Postprocedure Evaluation (Signed)
  Anesthesia Post-op Note  Patient: [name]  Procedure(s) Performed: Procedure(s): COLONOSCOPY WITH PROPOFOL (N/A)  Patient Location: PACU  Anesthesia Type:MAC  Level of Consciousness: awake, alert  and oriented  Airway and Oxygen Therapy: Patient Spontanous Breathing  Post-op Pain: none  Post-op Assessment: Post-op Vital signs reviewed, Patient's Cardiovascular Status Stable, Respiratory Function Stable, Patent Airway and No signs of Nausea or vomiting  Post-op Vital Signs: Reviewed and stable  Last Vitals:  Filed Vitals:   04/21/14 0807  BP: 115/62  Pulse: 96  Temp: 36.6 C  Resp: 22    Complications: No apparent anesthesia complications

## 2014-04-21 NOTE — H&P (Signed)
  Procedure: Screening colonoscopy.  History: The patient is a 74 year old female born 1940/04/07. She is scheduled to undergo a screening colonoscopy with polypectomy to prevent colon cancer.  Medication allergies: Sulfa drugs cause rash  Past medical history: Breast cancer. Bilateral mastectomy. TH/BSO. Asthma. Osteopenia. Macular degeneration. Hearing loss. Lactose intolerance.  Exam: The patient is alert and lying comfortably on the endoscopy stretcher. Abdomen is soft and nontender to palpation. Reactive exam reveals a regular rhythm. Lungs are clear to auscultation.  Plan: Proceed with screening colonoscopy

## 2014-04-21 NOTE — Transfer of Care (Signed)
Immediate Anesthesia Transfer of Care Note  Patient: [name]  Procedure(s) Performed: Procedure(s): COLONOSCOPY WITH PROPOFOL (N/A)  Patient Location: PACU  Anesthesia Type:MAC  Level of Consciousness: awake, alert  and oriented  Airway & Oxygen Therapy: Patient Spontanous Breathing and Patient connected to face mask oxygen  Post-op Assessment: Report given to PACU RN  Post vital signs: Reviewed and stable  Complications: No apparent anesthesia complications

## 2014-04-21 NOTE — Op Note (Signed)
Procedure: Screening colonoscopy  Endoscopist: Earle Gell  Premedication: Propofol administered by anesthesia  Procedure: The patient was placed in the left lateral decubitus position. Anal inspection and digital rectal exam were normal. The Pentax pediatric colonoscope was introduced into the rectum and advanced to the cecum. A normal-appearing appendiceal orifice was identified. A normal-appearing ileocecal valve was identified. Colonic preparation for the exam today was good. Withdrawal time was 11 minutes  Rectum. Normal. Retroflexed view of the distal rectum normal  Sigmoid colon and descending colon. Normal  Splenic flexure. Normal  Transverse colon. Normal  Hepatic flexure. A 5 mm sessile polyp was removed with the cold snare  Ascending colon. Normal  Cecum and ileocecal valve. Normal  Assessment: A 5 mm sessile polyp was removed from the hepatic flexure with the cold snare. Otherwise normal colonoscopy.

## 2014-04-22 ENCOUNTER — Encounter (HOSPITAL_COMMUNITY): Payer: Self-pay | Admitting: Gastroenterology

## 2014-06-26 DIAGNOSIS — J4520 Mild intermittent asthma, uncomplicated: Secondary | ICD-10-CM | POA: Diagnosis not present

## 2014-06-26 DIAGNOSIS — J309 Allergic rhinitis, unspecified: Secondary | ICD-10-CM | POA: Diagnosis not present

## 2014-06-26 DIAGNOSIS — J329 Chronic sinusitis, unspecified: Secondary | ICD-10-CM | POA: Diagnosis not present

## 2014-06-26 DIAGNOSIS — B349 Viral infection, unspecified: Secondary | ICD-10-CM | POA: Diagnosis not present

## 2014-07-19 NOTE — Op Note (Signed)
PATIENT NAME:  [name], [name] MR#:  [id number] DATE OF BIRTH:  Sep 30, 1940  DATE OF PROCEDURE:  10/01/2013  PREOPERATIVE DIAGNOSIS: Visually significant cataract of the right eye.   POSTOPERATIVE DIAGNOSIS: Visually significant cataract of the right eye.   OPERATIVE PROCEDURE: Cataract extraction by phacoemulsification with implant of intraocular lens to right eye.   SURGEON: Birder Robson, MD.   ANESTHESIA:  1. Managed anesthesia care.  2. Topical tetracaine drops followed by 2% Xylocaine jelly applied in the preoperative holding area.   COMPLICATIONS: None.   TECHNIQUE:  Stop and chop.   DESCRIPTION OF PROCEDURE: The patient was examined and consented in the preoperative holding area where the aforementioned topical anesthesia was applied to the right eye and then brought back to the Operating Room where the right eye was prepped and draped in the usual sterile ophthalmic fashion and a lid speculum was placed. A paracentesis was created with the side port blade and the anterior chamber was filled with viscoelastic. A near clear corneal incision was performed with the steel keratome. A continuous curvilinear capsulorrhexis was performed with a cystotome followed by the capsulorrhexis forceps. Hydrodissection and hydrodelineation were carried out with BSS on a blunt cannula. The lens was removed in a stop and chop technique and the remaining cortical material was removed with the irrigation-aspiration handpiece. The capsular bag was inflated with viscoelastic and the Tecnis ZCB00 20.0-diopter lens, serial number [id number] was placed in the capsular bag without complication. The remaining viscoelastic was removed from the eye with the irrigation-aspiration handpiece. The wounds were hydrated. The anterior chamber was flushed with Miostat and the eye was inflated to physiologic pressure. 0.1 mL of cefuroxime concentration 10 mg/mL was placed in the anterior chamber. The wounds were found to be  water tight. The eye was dressed with Vigamox. The patient was given protective glasses to wear throughout the day and a shield with which to sleep tonight. The patient was also given drops with which to begin a drop regimen today and will follow up with me in one day.    ____________________________ Livingston Diones. Khylee Algeo, MD wlp:dd D: 10/01/2013 22:44:24 ET T: 10/02/2013 01:59:25 ET JOB#: [id number]  cc: Ondra Deboard L. Delpha Perko, MD, <Dictator> Livingston Diones Anaiz Qazi MD ELECTRONICALLY SIGNED 10/23/2013 17:05

## 2014-07-19 NOTE — Op Note (Signed)
PATIENT NAME:  [name], [name] MR#:  [id number] DATE OF BIRTH:  1940-10-21  DATE OF PROCEDURE:  07/16/2013  PREOPERATIVE DIAGNOSIS: Visually significant cataract of the left eye.   POSTOPERATIVE DIAGNOSIS: Visually significant cataract of the left eye.   OPERATIVE PROCEDURE: Cataract extraction by phacoemulsification with implant of intraocular lens to left eye.   SURGEON: Birder Robson, MD.   ANESTHESIA:  1. Managed anesthesia care.  2. Topical tetracaine drops followed by 2% Xylocaine jelly applied in the preoperative holding area.   COMPLICATIONS: None.   TECHNIQUE:  Stop and chop.  DESCRIPTION OF PROCEDURE: The patient was examined and consented in the preoperative holding area where the aforementioned topical anesthesia was applied to the left eye and then brought back to the Operating Room where the left eye was prepped and draped in the usual sterile ophthalmic fashion and a lid speculum was placed. A paracentesis was created with the side port blade and the anterior chamber was filled with viscoelastic. A near clear corneal incision was performed with the steel keratome. A continuous curvilinear capsulorrhexis was performed with a cystotome followed by the capsulorrhexis forceps. Hydrodissection and hydrodelineation were carried out with BSS on a blunt cannula. The lens was removed in a stop and chop technique and the remaining cortical material was removed with the irrigation-aspiration handpiece. The capsular bag was inflated with viscoelastic and the Tecnis ZCB00 19.5-diopter lens, serial number [id number] was placed in the capsular bag without complication. The remaining viscoelastic was removed from the eye with the irrigation-aspiration handpiece. The wounds were hydrated. The anterior chamber was flushed with Miostat and the eye was inflated to physiologic pressure. 0.1 mL of cefuroxime concentration 10 mg/mL was placed in the anterior chamber. The wounds were found to be water  tight. The eye was dressed with Vigamox. The patient was given protective glasses to wear throughout the day and a shield with which to sleep tonight. The patient was also given drops with which to begin a drop regimen today and will follow-up with me in one day.      ____________________________ Livingston Diones. Tashema Tiller, MD wlp:dmm D: 07/16/2013 22:06:07 ET T: 07/16/2013 22:52:05 ET JOB#: [id number]  cc: Alaine Loughney L. Jyssica Rief, MD, <Dictator> Livingston Diones Odis Turck MD ELECTRONICALLY SIGNED 07/17/2013 13:52

## 2014-08-02 ENCOUNTER — Emergency Department (HOSPITAL_COMMUNITY): Payer: Commercial Managed Care - HMO

## 2014-08-02 ENCOUNTER — Encounter (HOSPITAL_COMMUNITY): Payer: Self-pay | Admitting: Emergency Medicine

## 2014-08-02 ENCOUNTER — Other Ambulatory Visit (HOSPITAL_COMMUNITY): Payer: Self-pay

## 2014-08-02 ENCOUNTER — Inpatient Hospital Stay (HOSPITAL_COMMUNITY)
Admission: EM | Admit: 2014-08-02 | Discharge: 2014-08-13 | DRG: 871 | Disposition: A | Discharge status: Home Health | Payer: Commercial Managed Care - HMO | Attending: Internal Medicine | Admitting: Internal Medicine

## 2014-08-02 DIAGNOSIS — E876 Hypokalemia: Secondary | ICD-10-CM | POA: Diagnosis not present

## 2014-08-02 DIAGNOSIS — Z8249 Family history of ischemic heart disease and other diseases of the circulatory system: Secondary | ICD-10-CM

## 2014-08-02 DIAGNOSIS — M8580 Other specified disorders of bone density and structure, unspecified site: Secondary | ICD-10-CM | POA: Diagnosis present

## 2014-08-02 DIAGNOSIS — E874 Mixed disorder of acid-base balance: Secondary | ICD-10-CM | POA: Diagnosis not present

## 2014-08-02 DIAGNOSIS — R1030 Lower abdominal pain, unspecified: Secondary | ICD-10-CM | POA: Diagnosis not present

## 2014-08-02 DIAGNOSIS — R609 Edema, unspecified: Secondary | ICD-10-CM | POA: Diagnosis not present

## 2014-08-02 DIAGNOSIS — J918 Pleural effusion in other conditions classified elsewhere: Secondary | ICD-10-CM | POA: Diagnosis not present

## 2014-08-02 DIAGNOSIS — E875 Hyperkalemia: Secondary | ICD-10-CM | POA: Diagnosis not present

## 2014-08-02 DIAGNOSIS — Z452 Encounter for adjustment and management of vascular access device: Secondary | ICD-10-CM | POA: Diagnosis not present

## 2014-08-02 DIAGNOSIS — T502X5A Adverse effect of carbonic-anhydrase inhibitors, benzothiadiazides and other diuretics, initial encounter: Secondary | ICD-10-CM | POA: Diagnosis present

## 2014-08-02 DIAGNOSIS — Z853 Personal history of malignant neoplasm of breast: Secondary | ICD-10-CM | POA: Diagnosis not present

## 2014-08-02 DIAGNOSIS — E871 Hypo-osmolality and hyponatremia: Secondary | ICD-10-CM | POA: Diagnosis present

## 2014-08-02 DIAGNOSIS — R599 Enlarged lymph nodes, unspecified: Secondary | ICD-10-CM | POA: Diagnosis present

## 2014-08-02 DIAGNOSIS — H9193 Unspecified hearing loss, bilateral: Secondary | ICD-10-CM | POA: Diagnosis present

## 2014-08-02 DIAGNOSIS — E872 Acidosis: Secondary | ICD-10-CM | POA: Diagnosis not present

## 2014-08-02 DIAGNOSIS — J181 Lobar pneumonia, unspecified organism: Secondary | ICD-10-CM | POA: Diagnosis not present

## 2014-08-02 DIAGNOSIS — R091 Pleurisy: Secondary | ICD-10-CM | POA: Diagnosis not present

## 2014-08-02 DIAGNOSIS — Z9889 Other specified postprocedural states: Secondary | ICD-10-CM | POA: Diagnosis not present

## 2014-08-02 DIAGNOSIS — T380X5A Adverse effect of glucocorticoids and synthetic analogues, initial encounter: Secondary | ICD-10-CM | POA: Diagnosis present

## 2014-08-02 DIAGNOSIS — R0609 Other forms of dyspnea: Secondary | ICD-10-CM | POA: Diagnosis not present

## 2014-08-02 DIAGNOSIS — J45909 Unspecified asthma, uncomplicated: Secondary | ICD-10-CM | POA: Diagnosis present

## 2014-08-02 DIAGNOSIS — J189 Pneumonia, unspecified organism: Secondary | ICD-10-CM | POA: Diagnosis not present

## 2014-08-02 DIAGNOSIS — H3530 Unspecified macular degeneration: Secondary | ICD-10-CM | POA: Diagnosis present

## 2014-08-02 DIAGNOSIS — J869 Pyothorax without fistula: Secondary | ICD-10-CM | POA: Diagnosis not present

## 2014-08-02 DIAGNOSIS — Z90710 Acquired absence of both cervix and uterus: Secondary | ICD-10-CM

## 2014-08-02 DIAGNOSIS — I493 Ventricular premature depolarization: Secondary | ICD-10-CM | POA: Diagnosis present

## 2014-08-02 DIAGNOSIS — S27309S Unspecified injury of lung, unspecified, sequela: Secondary | ICD-10-CM | POA: Diagnosis not present

## 2014-08-02 DIAGNOSIS — Z9012 Acquired absence of left breast and nipple: Secondary | ICD-10-CM | POA: Diagnosis present

## 2014-08-02 DIAGNOSIS — R0902 Hypoxemia: Secondary | ICD-10-CM

## 2014-08-02 DIAGNOSIS — R1084 Generalized abdominal pain: Secondary | ICD-10-CM | POA: Diagnosis not present

## 2014-08-02 DIAGNOSIS — G4733 Obstructive sleep apnea (adult) (pediatric): Secondary | ICD-10-CM | POA: Diagnosis present

## 2014-08-02 DIAGNOSIS — S27309A Unspecified injury of lung, unspecified, initial encounter: Secondary | ICD-10-CM | POA: Diagnosis not present

## 2014-08-02 DIAGNOSIS — S27309D Unspecified injury of lung, unspecified, subsequent encounter: Secondary | ICD-10-CM | POA: Diagnosis not present

## 2014-08-02 DIAGNOSIS — A419 Sepsis, unspecified organism: Principal | ICD-10-CM

## 2014-08-02 DIAGNOSIS — J9601 Acute respiratory failure with hypoxia: Secondary | ICD-10-CM | POA: Diagnosis not present

## 2014-08-02 DIAGNOSIS — Z9882 Breast implant status: Secondary | ICD-10-CM

## 2014-08-02 DIAGNOSIS — N179 Acute kidney failure, unspecified: Secondary | ICD-10-CM | POA: Diagnosis not present

## 2014-08-02 DIAGNOSIS — R739 Hyperglycemia, unspecified: Secondary | ICD-10-CM | POA: Diagnosis present

## 2014-08-02 DIAGNOSIS — R1010 Upper abdominal pain, unspecified: Secondary | ICD-10-CM | POA: Diagnosis not present

## 2014-08-02 DIAGNOSIS — R109 Unspecified abdominal pain: Secondary | ICD-10-CM | POA: Insufficient documentation

## 2014-08-02 DIAGNOSIS — R590 Localized enlarged lymph nodes: Secondary | ICD-10-CM | POA: Diagnosis not present

## 2014-08-02 DIAGNOSIS — J9600 Acute respiratory failure, unspecified whether with hypoxia or hypercapnia: Secondary | ICD-10-CM

## 2014-08-02 DIAGNOSIS — I10 Essential (primary) hypertension: Secondary | ICD-10-CM | POA: Diagnosis present

## 2014-08-02 DIAGNOSIS — R4182 Altered mental status, unspecified: Secondary | ICD-10-CM | POA: Diagnosis not present

## 2014-08-02 DIAGNOSIS — D649 Anemia, unspecified: Secondary | ICD-10-CM | POA: Diagnosis present

## 2014-08-02 DIAGNOSIS — J948 Other specified pleural conditions: Secondary | ICD-10-CM | POA: Diagnosis not present

## 2014-08-02 DIAGNOSIS — Z79899 Other long term (current) drug therapy: Secondary | ICD-10-CM

## 2014-08-02 DIAGNOSIS — J984 Other disorders of lung: Secondary | ICD-10-CM | POA: Diagnosis not present

## 2014-08-02 DIAGNOSIS — K769 Liver disease, unspecified: Secondary | ICD-10-CM | POA: Diagnosis not present

## 2014-08-02 DIAGNOSIS — E860 Dehydration: Secondary | ICD-10-CM | POA: Diagnosis present

## 2014-08-02 DIAGNOSIS — R0602 Shortness of breath: Secondary | ICD-10-CM | POA: Diagnosis not present

## 2014-08-02 DIAGNOSIS — K8020 Calculus of gallbladder without cholecystitis without obstruction: Secondary | ICD-10-CM | POA: Diagnosis not present

## 2014-08-02 DIAGNOSIS — J90 Pleural effusion, not elsewhere classified: Secondary | ICD-10-CM | POA: Diagnosis not present

## 2014-08-02 DIAGNOSIS — R509 Fever, unspecified: Secondary | ICD-10-CM | POA: Diagnosis not present

## 2014-08-02 DIAGNOSIS — R197 Diarrhea, unspecified: Secondary | ICD-10-CM | POA: Diagnosis present

## 2014-08-02 LAB — BLOOD GAS, ARTERIAL
Acid-base deficit: 5.1 mmol/L — ABNORMAL HIGH (ref 0.0–2.0)
BICARBONATE: 17.5 meq/L — AB (ref 20.0–24.0)
Drawn by: 307971
FIO2: 0.21 %
O2 SAT: 88.6 %
PCO2 ART: 26.8 mmHg — AB (ref 35.0–45.0)
Patient temperature: 98.6
TCO2: 15.8 mmol/L (ref 0–100)
pH, Arterial: 7.431 (ref 7.350–7.450)
pO2, Arterial: 54 mmHg — ABNORMAL LOW (ref 80.0–100.0)

## 2014-08-02 LAB — COMPREHENSIVE METABOLIC PANEL
ALBUMIN: 3.2 g/dL — AB (ref 3.5–5.0)
ALT: 31 U/L (ref 14–54)
AST: 59 U/L — ABNORMAL HIGH (ref 15–41)
Alkaline Phosphatase: 88 U/L (ref 38–126)
Anion gap: 11 (ref 5–15)
BILIRUBIN TOTAL: 1.9 mg/dL — AB (ref 0.3–1.2)
BUN: 30 mg/dL — AB (ref 6–20)
CALCIUM: 8.7 mg/dL — AB (ref 8.9–10.3)
CHLORIDE: 101 mmol/L (ref 101–111)
CO2: 18 mmol/L — ABNORMAL LOW (ref 22–32)
CREATININE: 1.87 mg/dL — AB (ref 0.44–1.00)
GFR calc Af Amer: 30 mL/min — ABNORMAL LOW (ref >60)
GFR, EST NON AFRICAN AMERICAN: 26 mL/min — AB (ref >60)
Glucose, Bld: 120 mg/dL — ABNORMAL HIGH (ref 70–99)
Potassium: 4.5 mmol/L (ref 3.5–5.1)
Sodium: 130 mmol/L — ABNORMAL LOW (ref 135–145)
Total Protein: 6.6 g/dL (ref 6.5–8.1)

## 2014-08-02 LAB — CBC WITH DIFFERENTIAL/PLATELET
BASOS ABS: 0 10*3/uL (ref 0.0–0.1)
Basophils Absolute: 0 10*3/uL (ref 0.0–0.1)
Basophils Relative: 0 % (ref 0–1)
Basophils Relative: 0 % (ref 0–1)
EOS PCT: 0 % (ref 0–5)
Eosinophils Absolute: 0 10*3/uL (ref 0.0–0.7)
Eosinophils Absolute: 0 10*3/uL (ref 0.0–0.7)
Eosinophils Relative: 0 % (ref 0–5)
HCT: 37.7 % (ref 36.0–46.0)
HEMATOCRIT: 33.3 % — AB (ref 36.0–46.0)
Hemoglobin: 12.6 g/dL (ref 12.0–15.0)
Hemoglobin: 11.1 g/dL — ABNORMAL LOW (ref 12.0–15.0)
Lymphocytes Relative: 2 % — ABNORMAL LOW (ref 12–46)
Lymphocytes Relative: 3 % — ABNORMAL LOW (ref 12–46)
Lymphs Abs: 0.5 10*3/uL — ABNORMAL LOW (ref 0.7–4.0)
Lymphs Abs: 0.4 10*3/uL — ABNORMAL LOW (ref 0.7–4.0)
MCH: 30.4 pg (ref 26.0–34.0)
MCH: 30.4 pg (ref 26.0–34.0)
MCHC: 33.4 g/dL (ref 30.0–36.0)
MCHC: 33.3 g/dL (ref 30.0–36.0)
MCV: 91.1 fL (ref 78.0–100.0)
MCV: 91.2 fL (ref 78.0–100.0)
MONOS PCT: 7 % (ref 3–12)
Monocytes Absolute: 1.5 10*3/uL — ABNORMAL HIGH (ref 0.1–1.0)
Monocytes Absolute: 0.8 10*3/uL (ref 0.1–1.0)
Monocytes Relative: 6 % (ref 3–12)
NEUTROS ABS: 22.1 10*3/uL — AB (ref 1.7–7.7)
NEUTROS ABS: 10.6 10*3/uL — AB (ref 1.7–7.7)
Neutrophils Relative %: 92 % — ABNORMAL HIGH (ref 43–77)
Neutrophils Relative %: 90 % — ABNORMAL HIGH (ref 43–77)
PLATELETS: 292 10*3/uL (ref 150–400)
PLATELETS: 215 10*3/uL (ref 150–400)
RBC: 4.14 MIL/uL (ref 3.87–5.11)
RBC: 3.65 MIL/uL — AB (ref 3.87–5.11)
RDW: 13.8 % (ref 11.5–15.5)
RDW: 13.9 % (ref 11.5–15.5)
WBC: 24.1 10*3/uL — ABNORMAL HIGH (ref 4.0–10.5)
WBC: 11.8 10*3/uL — AB (ref 4.0–10.5)

## 2014-08-02 LAB — URINALYSIS, ROUTINE W REFLEX MICROSCOPIC
Bilirubin Urine: NEGATIVE
Bilirubin Urine: NEGATIVE
GLUCOSE, UA: NEGATIVE mg/dL
Glucose, UA: NEGATIVE mg/dL
HGB URINE DIPSTICK: NEGATIVE
KETONES UR: NEGATIVE mg/dL
Ketones, ur: NEGATIVE mg/dL
LEUKOCYTES UA: NEGATIVE
NITRITE: NEGATIVE
Nitrite: NEGATIVE
PROTEIN: 100 mg/dL — AB
Protein, ur: 30 mg/dL — AB
SPECIFIC GRAVITY, URINE: 1.019 (ref 1.005–1.030)
Specific Gravity, Urine: 1.014 (ref 1.005–1.030)
Urobilinogen, UA: 0.2 mg/dL (ref 0.0–1.0)
Urobilinogen, UA: 0.2 mg/dL (ref 0.0–1.0)
pH: 5 (ref 5.0–8.0)
pH: 5.5 (ref 5.0–8.0)

## 2014-08-02 LAB — I-STAT CG4 LACTIC ACID, ED: Lactic Acid, Venous: 3.65 mmol/L (ref 0.5–2.0)

## 2014-08-02 LAB — CREATININE, SERUM
CREATININE: 1.46 mg/dL — AB (ref 0.44–1.00)
GFR calc non Af Amer: 34 mL/min — ABNORMAL LOW (ref >60)
GFR, EST AFRICAN AMERICAN: 40 mL/min — AB (ref >60)

## 2014-08-02 LAB — PROTIME-INR
INR: 1.28 (ref 0.00–1.49)
PROTHROMBIN TIME: 16.2 s — AB (ref 11.6–15.2)

## 2014-08-02 LAB — URINE MICROSCOPIC-ADD ON

## 2014-08-02 LAB — LACTIC ACID, PLASMA: Lactic Acid, Venous: 3.3 mmol/L (ref 0.5–2.0)

## 2014-08-02 LAB — TSH: TSH: 1.175 u[IU]/mL (ref 0.350–4.500)

## 2014-08-02 LAB — MRSA PCR SCREENING: MRSA BY PCR: NEGATIVE

## 2014-08-02 LAB — MAGNESIUM: Magnesium: 1.9 mg/dL (ref 1.7–2.4)

## 2014-08-02 LAB — LIPASE, BLOOD: Lipase: 13 U/L — ABNORMAL LOW (ref 22–51)

## 2014-08-02 LAB — PHOSPHORUS: PHOSPHORUS: 2.6 mg/dL (ref 2.5–4.6)

## 2014-08-02 LAB — APTT: APTT: 36 s (ref 24–37)

## 2014-08-02 LAB — PROCALCITONIN: Procalcitonin: 82.06 ng/mL

## 2014-08-02 MED ORDER — BUDESONIDE-FORMOTEROL FUMARATE 80-4.5 MCG/ACT IN AERO
2.0000 | INHALATION_SPRAY | Freq: Two times a day (BID) | RESPIRATORY_TRACT | Status: DC
  Administered 2014-08-02 – 2014-08-06 (×9): 2 via RESPIRATORY_TRACT
  Filled 2014-08-02: qty 6.9

## 2014-08-02 MED ORDER — AZITHROMYCIN 500 MG IV SOLR
500.0000 mg | Freq: Once | INTRAVENOUS | Status: AC
  Administered 2014-08-02: 500 mg via INTRAVENOUS
  Filled 2014-08-02: qty 500

## 2014-08-02 MED ORDER — SODIUM CHLORIDE 0.9 % IV BOLUS (SEPSIS)
1000.0000 mL | Freq: Once | INTRAVENOUS | Status: AC
  Administered 2014-08-02: 1000 mL via INTRAVENOUS

## 2014-08-02 MED ORDER — FENTANYL CITRATE (PF) 100 MCG/2ML IJ SOLN
50.0000 ug | Freq: Once | INTRAMUSCULAR | Status: AC
  Administered 2014-08-02: 50 ug via INTRAVENOUS
  Filled 2014-08-02: qty 2

## 2014-08-02 MED ORDER — LEVALBUTEROL HCL 1.25 MG/0.5ML IN NEBU
1.2500 mg | INHALATION_SOLUTION | Freq: Once | RESPIRATORY_TRACT | Status: AC
  Administered 2014-08-02: 1.25 mg via RESPIRATORY_TRACT
  Filled 2014-08-02: qty 0.5

## 2014-08-02 MED ORDER — ONDANSETRON HCL 4 MG/2ML IJ SOLN
4.0000 mg | Freq: Once | INTRAMUSCULAR | Status: AC
  Administered 2014-08-02: 4 mg via INTRAVENOUS
  Filled 2014-08-02: qty 2

## 2014-08-02 MED ORDER — FLUTICASONE PROPIONATE 50 MCG/ACT NA SUSP
1.0000 | Freq: Every day | NASAL | Status: DC
  Administered 2014-08-02 – 2014-08-03 (×2): 2 via NASAL
  Administered 2014-08-04 – 2014-08-05 (×2): 1 via NASAL
  Administered 2014-08-06: 2 via NASAL
  Administered 2014-08-08 – 2014-08-10 (×2): 1 via NASAL
  Administered 2014-08-11: 2 via NASAL
  Administered 2014-08-12: 1 via NASAL
  Filled 2014-08-02: qty 16

## 2014-08-02 MED ORDER — CEFTRIAXONE SODIUM 1 G IJ SOLR
1.0000 g | Freq: Once | INTRAMUSCULAR | Status: AC
  Administered 2014-08-02: 1 g via INTRAVENOUS
  Filled 2014-08-02: qty 10

## 2014-08-02 MED ORDER — METHYLPREDNISOLONE SODIUM SUCC 40 MG IJ SOLR
40.0000 mg | Freq: Two times a day (BID) | INTRAMUSCULAR | Status: DC
  Administered 2014-08-02 – 2014-08-03 (×2): 40 mg via INTRAVENOUS
  Filled 2014-08-02 (×2): qty 1

## 2014-08-02 MED ORDER — AZELASTINE HCL 0.1 % NA SOLN
1.0000 | Freq: Every day | NASAL | Status: DC
  Administered 2014-08-02 – 2014-08-12 (×9): 1 via NASAL
  Filled 2014-08-02: qty 30

## 2014-08-02 MED ORDER — SODIUM CHLORIDE 0.9 % IV SOLN
INTRAVENOUS | Status: DC
  Administered 2014-08-02: 20:00:00 via INTRAVENOUS

## 2014-08-02 MED ORDER — METOPROLOL TARTRATE 1 MG/ML IV SOLN
5.0000 mg | INTRAVENOUS | Status: DC | PRN
  Administered 2014-08-02 – 2014-08-07 (×6): 5 mg via INTRAVENOUS
  Filled 2014-08-02 (×6): qty 5

## 2014-08-02 MED ORDER — IOHEXOL 300 MG/ML  SOLN
50.0000 mL | Freq: Once | INTRAMUSCULAR | Status: DC | PRN
Start: 2014-08-02 — End: 2014-08-02

## 2014-08-02 MED ORDER — GUAIFENESIN ER 600 MG PO TB12
600.0000 mg | ORAL_TABLET | Freq: Two times a day (BID) | ORAL | Status: DC
  Administered 2014-08-02 – 2014-08-06 (×9): 600 mg via ORAL
  Filled 2014-08-02 (×9): qty 1

## 2014-08-02 MED ORDER — ACETAMINOPHEN 325 MG PO TABS
650.0000 mg | ORAL_TABLET | Freq: Four times a day (QID) | ORAL | Status: DC | PRN
  Administered 2014-08-02 – 2014-08-13 (×4): 650 mg via ORAL
  Filled 2014-08-02 (×4): qty 2

## 2014-08-02 MED ORDER — MORPHINE SULFATE 2 MG/ML IJ SOLN
1.0000 mg | INTRAMUSCULAR | Status: DC | PRN
  Administered 2014-08-02: 1 mg via INTRAVENOUS
  Administered 2014-08-04 – 2014-08-05 (×3): 2 mg via INTRAVENOUS
  Filled 2014-08-02 (×4): qty 1

## 2014-08-02 MED ORDER — AZITHROMYCIN 500 MG IV SOLR
500.0000 mg | INTRAVENOUS | Status: AC
  Administered 2014-08-03 – 2014-08-08 (×6): 500 mg via INTRAVENOUS
  Filled 2014-08-02 (×6): qty 500

## 2014-08-02 MED ORDER — ONDANSETRON HCL 4 MG/2ML IJ SOLN: 4.0000 mg | Freq: Four times a day (QID) | INTRAMUSCULAR | Status: DC | PRN

## 2014-08-02 MED ORDER — ONDANSETRON HCL 4 MG PO TABS: 4.0000 mg | ORAL_TABLET | Freq: Four times a day (QID) | ORAL | Status: DC | PRN

## 2014-08-02 MED ORDER — ENOXAPARIN SODIUM 30 MG/0.3ML ~~LOC~~ SOLN
30.0000 mg | SUBCUTANEOUS | Status: DC
  Administered 2014-08-02: 30 mg via SUBCUTANEOUS
  Filled 2014-08-02: qty 0.3

## 2014-08-02 MED ORDER — SODIUM CHLORIDE 0.9 % IV BOLUS (SEPSIS)
500.0000 mL | Freq: Once | INTRAVENOUS | Status: AC
Start: 2014-08-02 — End: 2014-08-03
  Administered 2014-08-02: 500 mL via INTRAVENOUS

## 2014-08-02 MED ORDER — LEVALBUTEROL HCL 0.63 MG/3ML IN NEBU
0.6300 mg | INHALATION_SOLUTION | Freq: Four times a day (QID) | RESPIRATORY_TRACT | Status: DC | PRN
  Administered 2014-08-05: 0.63 mg via RESPIRATORY_TRACT
  Filled 2014-08-02: qty 3

## 2014-08-02 MED ORDER — DEXTROSE 5 % IV SOLN
1.0000 g | INTRAVENOUS | Status: AC
  Administered 2014-08-03 – 2014-08-08 (×6): 1 g via INTRAVENOUS
  Filled 2014-08-02 (×6): qty 10

## 2014-08-02 MED ORDER — LEVALBUTEROL HCL 0.63 MG/3ML IN NEBU
0.6300 mg | INHALATION_SOLUTION | Freq: Four times a day (QID) | RESPIRATORY_TRACT | Status: DC
  Administered 2014-08-02 – 2014-08-03 (×5): 0.63 mg via RESPIRATORY_TRACT
  Filled 2014-08-02 (×4): qty 3

## 2014-08-02 MED ORDER — GUAIFENESIN-DM 100-10 MG/5ML PO SYRP: 5.0000 mL | ORAL_SOLUTION | ORAL | Status: DC | PRN

## 2014-08-02 MED ORDER — RALOXIFENE HCL 60 MG PO TABS
60.0000 mg | ORAL_TABLET | Freq: Every day | ORAL | Status: DC
  Administered 2014-08-03 – 2014-08-13 (×11): 60 mg via ORAL
  Filled 2014-08-02 (×14): qty 1

## 2014-08-02 NOTE — H&P (Signed)
[facility] History and Physical  [name] GBT:[id number] DOB: [birth year] DOA: 08/02/2014  Referring physician: Noemi Chapel PCP: Mayra Neer, MD   Chief Complaint: dyspnea   HPI:  Patient is 74 year old female with history of asthma and hypertension, presented to [facility] emergency department with 3 days duration of progressively worsening dyspnea, initially started with exertion and has progressed to dyspnea at rest over the past 24 hours. This has been associated with subjective fevers and chills, productive cough of clear to yellow sputum, chest tightness that occurs with coughing spells. Patient also reports malaise and poor oral intake, watery diarrhea also several days in duration. She denies any specific alleviating or aggravating factors. She also denies recent sick contacts or exposures. She denies other abdominal or urinary concerns, no specific neurological symptoms other than generalized weakness.  In emergency department, patient noted to be in mild distress due to dyspnea. Vital signs notable for T 102.9 F, heart rate 140, RR 33, blood pressure 92/52, oxygen saturation 85% on room air. Blood work notable for WBC 20 4K, Cr 1.87. CT abd/pelvis concerning for developing right middle and lower lobe pneumonia. TRH asked to admit to step down unit for evaluation and management of presumptive sepsis sepsis secondary to pneumonia.  Assessment and Plan: Active Problems: Sepsis secondary to pneumonia, right middle and lower lobe, unknown pathogen - Sepsis criteria met on admission with vital signs noted above - Admit to step down unit for closer observation - Obtain blood cultures, sputum culture, urine Legionella and strep pneumo, lactic acid) calcitonin level - Placed on Zithromax and Rocephin IV for now and readjust medical regimen is clinically indicated - Provide supportive care with oxygen via [place], placed on Solu-Medrol 40 mg twice a day due to wheezing, provide  bronchodilators scheduled and as needed Acute respiratory failure with hypoxia - Secondary to pneumonia as outlined above - Keep on oxygen via [place] for now  Tachycardia - Sinus rhythm, likely reactive secondary to sepsis as noted above - Provide metoprolol 5 mg IV every 4 hours as needed for heart rate > 115 - Monitor on telemetry Diarrhea - unclear etiology - will obtain C. Diff and stool panel - keep on enteric precaution for now  Acute renal failure - Prerenal in etiology from sepsis - Provide IV fluids and repeat BMP in the morning Metabolic acidosis - Also secondary to sepsis, IV fluids as noted above and repeat BMP in the morning Hyponatremia - Mild, IV fluids will be provided and will repeat electrolyte panel in the morning Several hepatic lesions - Incidental finding on CT abdomen and pelvis - Recommendation is to obtain outpatient MR of the liver with contrast for further evaluation to differentiate benign from malignant etiologies Central mesenteric adenopathy identified on CT abdomen and pelvis - Appears to be reactive mesenteric adenitis, lymphoma cannot be entirely excluded based on CT abdomen and pelvis - Will need close monitoring in an outpatient setting DVT prophylaxis - Lovenox SQ  Radiological Exams on Admission: Ct Abd Pelvis Wo Contrast  08/02/2014   Right middle and lower lobe pneumonia with small right pleural effusion. 2. At least 3 nonspecific hepatic lesions. Consider elective outpatient MR liver with contrast for further characterization, to differentiate benign from malignant etiologies. 3. 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 Reactive mesenteric adenitis and lymphoma would be the primary considerations. 4. Cholelithiasis  CXR  08/02/2014    RIGHT middle lobe consolidation consistent with pneumonia, with probable mild additional infiltrate throughout remainder of RIGHT lung.  Code Status: Full Family Communication: Pt  and husband updated at bedside Disposition Plan: Admit for further evaluation, patient is from home and will be going home once medically stable  [facility] [phone]   Review of Systems:  Constitutional: Negative for diaphoresis.  HENT: Negative for hearing loss, ear pain, nosebleeds, congestion, sore throat, neck pain, tinnitus and ear discharge.   Eyes: Negative for blurred vision, double vision, photophobia, pain, discharge and redness.  Respiratory: Negative for stridor.   Cardiovascular: Negative for  claudication and leg swelling.  Gastrointestinal: Negative for nausea, vomiting and abdominal pain.  Genitourinary: Negative for dysuria, urgency, frequency, hematuria and flank pain.  Musculoskeletal: Negative for myalgias, back pain, joint pain and falls.  Skin: Negative for itching and rash.  Neurological: Negative for dizziness Endo/Heme/Allergies: Negative for environmental allergies and polydipsia. Does not bruise/bleed easily.  Psychiatric/Behavioral: Negative for suicidal ideas. The patient is not nervous/anxious.      Past Medical History  Diagnosis Date  . Asthma   . Macular degeneration   . Hearing loss     both ears, wwears hearing aides  . Osteopenia   . Arthritis   . Cancer of breast     33 years ago, left,     Past Surgical History  Procedure Laterality Date  . Abdominal hysterectomy    . Cesarean section      x 2  . Breast enhancement surgery    . Left heart catheterization with coronary angiogram N/A 01/24/2012    Procedure: LEFT HEART CATHETERIZATION WITH CORONARY ANGIOGRAM;  Surgeon: Sueanne Margarita, MD;  Location: [facility];  Service: Cardiovascular;  Laterality: N/A;  . Mastectomy  33 yrs ago    bilateral  . Eye surgery Bilateral march 2015 and june 2015    lens for cataracts  . Colonoscopy with propofol N/A 04/21/2014    Procedure: COLONOSCOPY WITH PROPOFOL;  Surgeon: Garlan Fair, MD;  Location: [facility];  Service: Endoscopy;   Laterality: N/A;    Social History:  reports that she has never smoked. She has never used smokeless tobacco. She reports that she drinks alcohol. She reports that she does not use illicit drugs.  Allergies  Allergen Reactions  . Fosamax [Alendronate Sodium] Nausea Only  . Sulfonamide Derivatives Rash    Family History  Problem Relation Age of Onset  . Heart attack Father     Medication Sig  albuterol (PROAIR HFA) 108 (90 BASE) MCG/ACT inhaler Inhale 2 puffs into the lungs every 6 (six) hours as needed for wheezing.  azelastine (ASTELIN) 137 MCG/SPRAY nasal spray Place 1 spray into the nose at bedtime. Use in each nostril as directed  budesonide-formoterol (SYMBICORT) 80-4.5 MCG/ACT inhaler Inhale 2 puffs into the lungs 2 (two) times daily.  calcium carbonate (TUMS) 500 MG chewable tablet Chew 1 tablet by mouth at bedtime.   Cholecalciferol (VITAMIN D) 1000 UNITS capsule Take 1,000 Units by mouth daily with breakfast.   fexofenadine (ALLEGRA) 180 MG tablet Take 180 mg by mouth daily with breakfast.   fluticasone (FLONASE) 50 MCG/ACT nasal spray Place 1-2 sprays into both nostrils at bedtime.   meloxicam (MOBIC) 15 MG tablet Take 15 mg by mouth daily.  Multiple Vitamins-Minerals (MULTI FOR HER 50+) TABS Take 1 tablet by mouth daily.  Multiple Vitamins-Minerals (PRESERVISION/LUTEIN) CAPS Take 1 capsule by mouth 2 (two) times daily.  Omega-3 Fatty Acids (FISH OIL) 1200 MG CAPS Take 1,200 mg by mouth at bedtime.  raloxifene (EVISTA) 60 MG tablet Take 60  mg by mouth daily with breakfast.   albuterol (PROVENTIL) (2.5 MG/3ML) 0.083% nebulizer solution Take 3 mLs (2.5 mg total) by nebulization every 6 (six) hours as needed. DX 493.90 Patient not taking: Reported on 04/07/2014    Physical Exam: Filed Vitals:   08/02/14 1356 08/02/14 1400 08/02/14 1404 08/02/14 1416  BP: 108/46 125/46    Pulse: 114 123 119 120  Temp:      TempSrc:      Resp:      SpO2:  92% 94% 93%    Physical Exam   Constitutional: Appears well-developed and well-nourished. In mild distress due to dyspnea  HENT: Normocephalic. External right and left ear normal. Oropharynx is clear and moist.  Eyes: Conjunctivae and EOM are normal. PERRLA, no scleral icterus.  Neck: Normal ROM. Neck supple. No JVD. No tracheal deviation. No thyromegaly.  CVS: Regular rhythm, tachycardic, S1/S2 +, no murmurs, no gallops, no carotid bruit.  Pulmonary: Course breath sounds bilaterally with exp wheezing, rhonchi worse on the right side Abdominal: Soft. BS +,  no distension, tenderness, rebound or guarding.  Musculoskeletal: Normal range of motion. No edema and no tenderness.  Lymphadenopathy: No lymphadenopathy noted, cervical, inguinal. Neuro: Alert. Normal reflexes, muscle tone coordination. No cranial nerve deficit. Skin: Skin is warm and dry. No rash noted. Not diaphoretic. No erythema. No pallor.  Psychiatric: Normal mood and affect.   Labs on Admission:  Basic Metabolic Panel:  Recent Labs Lab 08/02/14 1131  NA 130*  K 4.5  CL 101  CO2 18*  GLUCOSE 120*  BUN 30*  CREATININE 1.87*  CALCIUM 8.7*   Liver Function Tests:  Recent Labs Lab 08/02/14 1131  AST 59*  ALT 31  ALKPHOS 88  BILITOT 1.9*  PROT 6.6  ALBUMIN 3.2*    Recent Labs Lab 08/02/14 1131  LIPASE 13*   CBC:  Recent Labs Lab 08/02/14 1131  WBC 24.1*  NEUTROABS 22.1*  HGB 12.6  HCT 37.7  MCV 91.1  PLT 292    EKG: pending    If 7PM-7AM, please contact night-coverage [email or web address] Password [facility] 08/02/2014, 3:36 PM

## 2014-08-02 NOTE — ED Notes (Signed)
Pt. Unable to urinate at this time. Will collect urine specimen. Nurse aware.

## 2014-08-02 NOTE — ED Notes (Signed)
Tech at bedside collecting labs and cultures so that abx can be hung

## 2014-08-02 NOTE — ED Notes (Signed)
Pt placed on monitor by tech per protocol

## 2014-08-02 NOTE — ED Provider Notes (Signed)
CSN: [id number]     Arrival date & time 08/02/14  1036 History   First MD Initiated Contact with Patient 08/02/14 1053     Chief Complaint  Patient presents with  . Diarrhea  . Abdominal Pain     (Consider location/radiation/quality/duration/timing/severity/associated sxs/prior Treatment) HPI Comments: The patient is a 74 year old female, she has a history of arthritis and a remote history of breast cancer who has had a prior hysterectomy and cesarean sections who presents with a complaint of diarrhea, abdominal discomfort and some mental status changes. According to the husband she has not been answering questions clearly, has been slow to answer and appears slightly confused. She reports this is the third day of diffuse watery diarrhea, multiple episodes, his heart he had 3 episodes today. This is nonbloody, is associated with tenderness in the lower abdomen and multiple episodes of nausea with poor intake. She has had very little fluids and feels very dehydrated. She was initially seen at the [facility] walk-in clinic and was transferred to the emergency department for further evaluations.   Patient is a 74 y.o. female presenting with diarrhea and abdominal pain. The history is provided by the patient and the spouse.  Diarrhea Associated symptoms: abdominal pain   Abdominal Pain Associated symptoms: diarrhea     Past Medical History  Diagnosis Date  . Asthma   . Macular degeneration   . Hearing loss     both ears, wwears hearing aides  . Osteopenia   . Arthritis   . Cancer of breast     33 years ago, left,    Past Surgical History  Procedure Laterality Date  . Abdominal hysterectomy    . Cesarean section      x 2  . Breast enhancement surgery    . Left heart catheterization with coronary angiogram N/A 01/24/2012    Procedure: LEFT HEART CATHETERIZATION WITH CORONARY ANGIOGRAM;  Surgeon: Sueanne Margarita, MD;  Location: [facility] CATH [facility];  Service: Cardiovascular;  Laterality: N/A;  .  Mastectomy  33 yrs ago    bilateral  . Eye surgery Bilateral march 2015 and june 2015    lens for cataracts  . Colonoscopy with propofol N/A 04/21/2014    Procedure: COLONOSCOPY WITH PROPOFOL;  Surgeon: Garlan Fair, MD;  Location: [facility];  Service: Endoscopy;  Laterality: N/A;   Family History  Problem Relation Age of Onset  . Heart attack Father    History  Substance Use Topics  . Smoking status: Never Smoker   . Smokeless tobacco: Never Used  . Alcohol Use: Yes     Comment: glass wine 1x month   OB History    No data available     Review of Systems  Gastrointestinal: Positive for abdominal pain and diarrhea.  All other systems reviewed and are negative.     Allergies  Fosamax and Sulfonamide derivatives  Home Medications   Prior to Admission medications   Medication Sig Start Date End Date Taking? Authorizing Provider  albuterol (PROAIR HFA) 108 (90 BASE) MCG/ACT inhaler Inhale 2 puffs into the lungs every 6 (six) hours as needed for wheezing. 04/19/12  Yes [name], MD  azelastine (ASTELIN) 137 MCG/SPRAY nasal spray Place 1 spray into the nose at bedtime. Use in each nostril as directed   Yes Historical Provider, MD  budesonide-formoterol (SYMBICORT) 80-4.5 MCG/ACT inhaler Inhale 2 puffs into the lungs 2 (two) times daily.   Yes Historical Provider, MD  calcium carbonate (TUMS) 500 MG chewable tablet Chew  1 tablet by mouth at bedtime.    Yes Historical Provider, MD  Cholecalciferol (VITAMIN D) 1000 UNITS capsule Take 1,000 Units by mouth daily with breakfast.    Yes Historical Provider, MD  fexofenadine (ALLEGRA) 180 MG tablet Take 180 mg by mouth daily with breakfast.    Yes Historical Provider, MD  fluticasone (FLONASE) 50 MCG/ACT nasal spray Place 1-2 sprays into both nostrils at bedtime.    Yes Historical Provider, MD  meloxicam (MOBIC) 15 MG tablet Take 15 mg by mouth daily.   Yes Historical Provider, MD  Multiple Vitamins-Minerals (MULTI FOR HER 50+)  TABS Take 1 tablet by mouth daily.   Yes Historical Provider, MD  Multiple Vitamins-Minerals (PRESERVISION/LUTEIN) CAPS Take 1 capsule by mouth 2 (two) times daily.   Yes Historical Provider, MD  Omega-3 Fatty Acids (FISH OIL) 1200 MG CAPS Take 1,200 mg by mouth at bedtime.   Yes Historical Provider, MD  raloxifene (EVISTA) 60 MG tablet Take 60 mg by mouth daily with breakfast.    Yes Historical Provider, MD  albuterol (PROVENTIL) (2.5 MG/3ML) 0.083% nebulizer solution Take 3 mLs (2.5 mg total) by nebulization every 6 (six) hours as needed. DX 493.90 Patient not taking: Reported on 04/07/2014 04/26/12   [name], MD   BP 125/46 mmHg  Pulse 119  Temp(Src) 98.9 F (37.2 C) (Oral)  Resp 16  SpO2 94% Physical Exam  Constitutional: She appears well-developed and well-nourished. No distress.  HENT:  Head: Normocephalic and atraumatic.  Mouth/Throat: Oropharynx is clear and moist. No oropharyngeal exudate.  Eyes: Conjunctivae and EOM are normal. Pupils are equal, round, and reactive to light. Right eye exhibits no discharge. Left eye exhibits no discharge. No scleral icterus.  Neck: Normal range of motion. Neck supple. No JVD present. No thyromegaly present.  Cardiovascular: Regular rhythm, normal heart sounds and intact distal pulses.  Exam reveals no gallop and no friction rub.   No murmur heard. Mild sinus tachycardia  Pulmonary/Chest: Effort normal and breath sounds normal. No respiratory distress. She has no wheezes. She has no rales.  Abdominal: Soft. Bowel sounds are normal. She exhibits no distension and no mass. There is tenderness (minimal tenderness to palpation in the lower abdomen).  Musculoskeletal: Normal range of motion. She exhibits no edema or tenderness.  Lymphadenopathy:    She has no cervical adenopathy.  Neurological: She is alert. Coordination normal.  Normal strength in all 4 extremities, normal sensation in all 4 extremities, cranial nerves III through XII are normal,  speech is clear, she is slow to answer questions but gives appropriate answers  Skin: Skin is warm and dry. No rash noted. No erythema.  Psychiatric: She has a normal mood and affect. Her behavior is normal.  Nursing note and vitals reviewed.   ED Course  Procedures (including critical care time) Labs Review Labs Reviewed  CBC WITH DIFFERENTIAL/PLATELET - Abnormal; Notable for the following:    WBC 24.1 (*)    Neutrophils Relative % 92 (*)    Neutro Abs 22.1 (*)    Lymphocytes Relative 2 (*)    Lymphs Abs 0.5 (*)    Monocytes Absolute 1.5 (*)    All other components within normal limits  COMPREHENSIVE METABOLIC PANEL - Abnormal; Notable for the following:    Sodium 130 (*)    CO2 18 (*)    Glucose, Bld 120 (*)    BUN 30 (*)    Creatinine, Ser 1.87 (*)    Calcium 8.7 (*)    Albumin  3.2 (*)    AST 59 (*)    Total Bilirubin 1.9 (*)    GFR calc non Af Amer 26 (*)    GFR calc Af Amer 30 (*)    All other components within normal limits  URINALYSIS, ROUTINE W REFLEX MICROSCOPIC - Abnormal; Notable for the following:    APPearance TURBID (*)    Hgb urine dipstick SMALL (*)    Protein, ur 30 (*)    All other components within normal limits  LIPASE, BLOOD - Abnormal; Notable for the following:    Lipase 13 (*)    All other components within normal limits  URINE MICROSCOPIC-ADD ON - Abnormal; Notable for the following:    Bacteria, UA FEW (*)    Casts HYALINE CASTS (*)    All other components within normal limits  CLOSTRIDIUM DIFFICILE BY PCR  CULTURE, BLOOD (ROUTINE X 2)  CULTURE, BLOOD (ROUTINE X 2)  I-STAT CG4 LACTIC ACID, ED    Imaging Review Ct Abdomen Pelvis Wo Contrast  08/02/2014   CLINICAL DATA:  diarrhea and rt sided abd pain since Friday. States that on Thursday, she slept for 24 hrs and did not eat anything  EXAM: CT ABDOMEN AND PELVIS WITHOUT CONTRAST  TECHNIQUE: Multidetector CT imaging of the abdomen and pelvis was performed following the standard protocol  without IV contrast.  COMPARISON:  Radiograph from earlier the same day, and earlier studies  FINDINGS: Airspace consolidation with air bronchograms in the visualized portions of right middle lobe. Some patchy airspace opacities in the anterior and posterior basal segments right lower lobe. Small right pleural effusion. Bilateral breast prostheses are partially visualized. Patchy coronary calcifications.  Mild diffuse fatty infiltration of the liver. There is an 11 mm low-attenuation subcapsular hepatic segment 6 lesion, similar 15 mm segment 4A lesion, and a subcapsular 16 mm segment 2 lesion showing peripheral calcifications. Multiple partially calcified gallstones in the nondilated gallbladder. Unremarkable kidneys, adrenal glands, pancreas, and spleen. Unenhanced CT was performed per clinician order. Lack of IV contrast limits sensitivity and specificity, especially for evaluation of abdominal/pelvic solid viscera.  An increased number of central mesenteric lymph nodes, none greater than 1 cm short axis diameter. Mild inflammatory/edematous changes centrally in the surrounding mesentery. No retroperitoneal or pelvic adenopathy.  Stomach, small bowel, and colon are nondilated. Urinary bladder is nondistended. Previous hysterectomy. No ascites. No free air. Minimal spondylitic changes in the lower lumbar spine.  IMPRESSION: 1. Right middle and lower lobe pneumonia with small right pleural effusion. 2. At least 3 nonspecific hepatic lesions. Consider elective outpatient MR liver with contrast for further characterization, to differentiate benign from malignant etiologies. 3. 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 Reactive mesenteric adenitis and lymphoma would be the primary considerations. 4. Cholelithiasis   Electronically Signed   By: Lucrezia Europe M.D.   On: 08/02/2014 14:52   Dg Chest Port 1 View  08/02/2014   CLINICAL DATA:  Shortness of breath and fever since Thursday,  RIGHT axillary pain, pain shooting through RIGHT shoulder, history asthma and LEFT breast cancer  EXAM: PORTABLE CHEST - 1 VIEW  COMPARISON:  Portable exam 1420 hr compared to 10/17/2012  FINDINGS: Normal heart size, mediastinal contours and pulmonary vascularity for technique.  Atherosclerotic calcification aortic arch.  New RIGHT middle lobe consolidation consistent with pneumonia.  Probable additional infiltrate in RIGHT upper and RIGHT lower lobes.  LEFT lung grossly clear.  Central peribronchial thickening present.  No gross pleural effusion or pneumothorax.  Bones demineralized.  IMPRESSION: RIGHT middle lobe consolidation consistent with pneumonia, with probable mild additional infiltrate throughout remainder of RIGHT lung.  Followup PA and lateral chest X-ray is recommended in 3-4 weeks following trial of antibiotic therapy to ensure resolution and exclude underlying malignancy.   Electronically Signed   By: Lavonia Dana M.D.   On: 08/02/2014 14:32     MDM   Final diagnoses:  Abdominal pain  SOB (shortness of breath)  Sepsis, due to unspecified organism  CAP (community acquired pneumonia)    The patient definitely has an abnormal process going on causing her to have generalized weakness, diffuse watery diarrhea, suspect dehydration, possibly hypokalemic, labs ordered, fluids ordered, reevaluate.  On repeat evaluation the patient has become more tachypneic, speaking in shortened sentences and persistently tachycardic. Her hypotension has resolved with fluids, oxygen remains in the low to mid 90% on 2 L. Lung exam has not changed, but due to the increased tachypnea x-ray was ordered. Portable x-ray shows right-sided infiltrates in the right middle and right lower lobe, CT scan shows that the patient indeed does have right middle and right lower lobe pneumonias, mesenteric adenitis also present. Liver lesions which can be worked up at a later time.  The patient has taken tachycardia, hypoxia,  significant leukocytosis over 24,000 and sepsis a likely possibility of the source of the patient's infection. She will need to be treated for community-acquired pneumonia and sepsis, 30 mL/kg of IV fluids have been given, lactic acid ordered, blood cultures ordered, antibiotics ordered, the patient will be admitted to a stepdown unit. She is critically ill with sepsis.  CRITICAL CARE Performed by: Johnna Acosta Total critical care time: 35 Critical care time was exclusive of separately billable procedures and treating other patients. Critical care was necessary to treat or prevent imminent or life-threatening deterioration. Critical care was time spent personally by me on the following activities: development of treatment plan with patient and/or surrogate as well as nursing, discussions with consultants, evaluation of patient's response to treatment, examination of patient, obtaining history from patient or surrogate, ordering and performing treatments and interventions, ordering and review of laboratory studies, ordering and review of radiographic studies, pulse oximetry and re-evaluation of patient's condition.  Meds given in ED:  Medications  iohexol (OMNIPAQUE) 300 MG/ML solution 50 mL (not administered)  cefTRIAXone (ROCEPHIN) 1 g in dextrose 5 % 50 mL IVPB (not administered)  azithromycin (ZITHROMAX) 500 mg in dextrose 5 % 250 mL IVPB (not administered)  sodium chloride 0.9 % bolus 1,000 mL (not administered)  sodium chloride 0.9 % bolus 1,000 mL (0 mLs Intravenous Stopped 08/02/14 1341)  ondansetron (ZOFRAN) injection 4 mg (4 mg Intravenous Given 08/02/14 1132)  fentaNYL (SUBLIMAZE) injection 50 mcg (50 mcg Intravenous Given 08/02/14 1351)    New Prescriptions   No medications on file       Noemi Chapel, MD 08/02/14 1513

## 2014-08-02 NOTE — ED Notes (Signed)
Pt c/o diarrhea and rt sided abd pain since Friday.  States that on Thursday, she slept for 24 hrs and did not eat anything.  Hx of breast cancer (30 years ago).

## 2014-08-02 NOTE — ED Notes (Signed)
MD Regenia Skeeter notified about elevated lactic

## 2014-08-02 NOTE — Progress Notes (Signed)
CRITICAL VALUE ALERT  Critical value received:  Lactic acid  Date of notification:  08/02/2014  Time of notification:  2135 pm  Critical value read back: yes  Nurse who received alert:  Dyann Ruddle, RN  MD notified (1st page):  [facility], NP  Time of first page:  2244 pm  MD notified (2nd page):  Time of second page:  Responding MD:  [facility], NP  Time MD responded:  1975 pm

## 2014-08-02 NOTE — ED Notes (Signed)
Nurse currently starting IV 

## 2014-08-02 NOTE — ED Notes (Signed)
Upon ambulation pt reports an increase in R side pain. Dr Sabra Heck made aware. Pt O2 also noted to be 91% on RA. Pt reports a hx of asthma.

## 2014-08-02 NOTE — ED Notes (Signed)
MD at bedside. Garwin Brothers

## 2014-08-03 ENCOUNTER — Encounter (HOSPITAL_COMMUNITY): Payer: Self-pay | Admitting: *Deleted

## 2014-08-03 ENCOUNTER — Inpatient Hospital Stay (HOSPITAL_COMMUNITY): Payer: Commercial Managed Care - HMO

## 2014-08-03 DIAGNOSIS — J189 Pneumonia, unspecified organism: Secondary | ICD-10-CM

## 2014-08-03 LAB — HIV ANTIBODY (ROUTINE TESTING W REFLEX): HIV Screen 4th Generation wRfx: NONREACTIVE

## 2014-08-03 LAB — URINE CULTURE
CULTURE: NO GROWTH
Colony Count: NO GROWTH

## 2014-08-03 LAB — BASIC METABOLIC PANEL
Anion gap: 13 (ref 5–15)
BUN: 33 mg/dL — AB (ref 6–20)
CHLORIDE: 108 mmol/L (ref 101–111)
CO2: 14 mmol/L — ABNORMAL LOW (ref 22–32)
Calcium: 7.9 mg/dL — ABNORMAL LOW (ref 8.9–10.3)
Creatinine, Ser: 1.11 mg/dL — ABNORMAL HIGH (ref 0.44–1.00)
GFR calc Af Amer: 56 mL/min — ABNORMAL LOW (ref >60)
GFR calc non Af Amer: 48 mL/min — ABNORMAL LOW (ref >60)
GLUCOSE: 113 mg/dL — AB (ref 70–99)
POTASSIUM: 3.6 mmol/L (ref 3.5–5.1)
Sodium: 135 mmol/L (ref 135–145)

## 2014-08-03 LAB — CBC
HCT: 37.9 % (ref 36.0–46.0)
HEMOGLOBIN: 12.2 g/dL (ref 12.0–15.0)
MCH: 29.8 pg (ref 26.0–34.0)
MCHC: 32.2 g/dL (ref 30.0–36.0)
MCV: 92.4 fL (ref 78.0–100.0)
Platelets: 229 10*3/uL (ref 150–400)
RBC: 4.1 MIL/uL (ref 3.87–5.11)
RDW: 14.1 % (ref 11.5–15.5)
WBC: 16.9 10*3/uL — AB (ref 4.0–10.5)

## 2014-08-03 LAB — LACTIC ACID, PLASMA: LACTIC ACID, VENOUS: 2.3 mmol/L — AB (ref 0.5–2.0)

## 2014-08-03 LAB — STREP PNEUMONIAE URINARY ANTIGEN: STREP PNEUMO URINARY ANTIGEN: NEGATIVE

## 2014-08-03 LAB — TROPONIN I: Troponin I: <0.03 ng/mL (ref <0.031)

## 2014-08-03 LAB — CLOSTRIDIUM DIFFICILE BY PCR: CDIFFPCR: NEGATIVE

## 2014-08-03 MED ORDER — PREDNISONE 20 MG PO TABS
50.0000 mg | ORAL_TABLET | Freq: Every day | ORAL | Status: DC
  Administered 2014-08-03 – 2014-08-05 (×3): 50 mg via ORAL
  Filled 2014-08-03 (×2): qty 1
  Filled 2014-08-03 (×3): qty 2
  Filled 2014-08-03: qty 1

## 2014-08-03 MED ORDER — CETYLPYRIDINIUM CHLORIDE 0.05 % MT LIQD
7.0000 mL | Freq: Two times a day (BID) | OROMUCOSAL | Status: DC
  Administered 2014-08-03 – 2014-08-06 (×8): 7 mL via OROMUCOSAL

## 2014-08-03 MED ORDER — SODIUM CHLORIDE 0.9 % IV SOLN
INTRAVENOUS | Status: DC
  Administered 2014-08-06 – 2014-08-08 (×2): via INTRAVENOUS

## 2014-08-03 MED ORDER — ENOXAPARIN SODIUM 40 MG/0.4ML ~~LOC~~ SOLN
40.0000 mg | SUBCUTANEOUS | Status: DC
  Administered 2014-08-03 – 2014-08-06 (×4): 40 mg via SUBCUTANEOUS
  Filled 2014-08-03 (×4): qty 0.4

## 2014-08-03 MED ORDER — DEXTROSE 5 % IV SOLN
INTRAVENOUS | Status: DC
  Administered 2014-08-03 – 2014-08-05 (×3): via INTRAVENOUS
  Filled 2014-08-03 (×6): qty 150

## 2014-08-03 NOTE — Progress Notes (Signed)
Patient ID: [name], female   DOB: 10/31/1940, 74 y.o.   MRN: [id number]  [facility] PROGRESS NOTE  Ludwika Rodd ZDG:[id number] DOB: 1940/10/14 DOA: 08/02/2014 PCP: Mayra Neer, MD   Brief narrative:    Patient is 74 year old female with history of asthma and hypertension, presented to [facility] emergency department with 3 days duration of progressively worsening dyspnea, initially started with exertion and has progressed to dyspnea at rest over the past 24 hours. This has been associated with subjective fevers and chills, productive cough of clear to yellow sputum, chest tightness that occurs with coughing spells. Patient also reports malaise and poor oral intake, watery diarrhea also several days in duration. She denies any specific alleviating or aggravating factors. She also denies recent sick contacts or exposures. She denies other abdominal or urinary concerns, no specific neurological symptoms other than generalized weakness.  In emergency department, patient noted to be in mild distress due to dyspnea. Vital signs notable for T 102.9 F, heart rate 140, RR 33, blood pressure 92/52, oxygen saturation 85% on room air. Blood work notable for WBC 24K, Cr 1.87. CT abd/pelvis concerning for developing right middle and lower lobe pneumonia. TRH asked to admit to step down unit for evaluation and management of presumptive sepsis sepsis secondary to pneumonia.   Assessment/Plan:    Active Problems: Sepsis secondary to pneumonia, right middle and lower lobe, unknown pathogen - Sepsis criteria met on admission with vital signs noted above, in addition to lactic acid 3.5, procalcitonin 86 - pt was started on Zithromax and Rocephin in ED and we have continued same ABX on admission  - pt looks better this AM but HR still in 120's with RR 25, oxygen saturation 92% on 2 L [place] - WBC trending down form 324 K --> 17 K this AM  - may need to broaden the ABX coverage to vanc and zosyn if pt  starts to clinically deteriorate - keep in SDU for now - repeat lactic acid today, follow up on blood and sputum cultures, strep pneumo and urine legionella still pending  - UA with trace leukocytes, need to follow up on urine culture as well - keep on oxygen via [place] for now, since no wheezing this AM, change solumedrol to prednisone with planned tapering - continue bronchodilators scheduled and as needed  Acute respiratory failure with hypoxia - Secondary to pneumonia as outlined above in addition to small right pleural effusion  - continue Zithromax and rocephin day #2, but may need to broaden coverage depending on clinical progression  - start Prednisone today 50 mg tablet and taper down by 10 mg daily until completed  - 2 D ECHO requested  - close monitoring of weights status as weight is up from 141 --> 146 lbs this AM - lower the rate of IVF infusion from 75 --> 50 cc /hr, stop IVF once pt has better oral intake  - Keep on oxygen via [place]  Tachycardia with intermittent chest tightness  - Sinus rhythm, likely reactive secondary to sepsis as noted above - continue to provide metoprolol 5 mg IV every 4 hours as needed for heart rate > 115 - will ask for one set of troponin - 12 lead EHF 5/7 with sinus tachycardia and scattered PVC which are likely due to sepsis in the setting of PNA  - keep on telemetry monitor for now  Diarrhea - unclear etiology - C. Diff and stool panel still pending  - keep on enteric  precaution for now  Acute renal failure - Prerenal in etiology from sepsis - Provided IV fluids, Cr trending down from 1.87 --> 1.46 --> 1.11 - continue IVF and repeat BMP in AM Metabolic acidosis - Also secondary to sepsis, CO2 down to 14 this AM - will add bicarb to IVF and repeat BMP in AM Hyponatremia - responded to IVF, now WNL this AM Several hepatic lesions - Incidental finding on CT abdomen and pelvis - Recommendation is to obtain outpatient MR of the liver with contrast  for further evaluation to differentiate benign from malignant etiologies Central mesenteric adenopathy identified on CT abdomen and pelvis - Appears to be reactive mesenteric adenitis, lymphoma cannot be entirely excluded based on CT abdomen and pelvis - Will need close monitoring in an outpatient setting DVT prophylaxis - Lovenox SQ  Code Status: Full.  Family Communication:  plan of care discussed with the patient and daughter at bedside  Disposition Plan: Barriers to d/c --> still requiring IVF and IV ABX, stool panel and C. Diff pending along with blood, urine, sputum cultures. Pt is still fairly weak and needs continued monitoring in SDU for at least another 24 hours.   IV access:  Peripheral IV  Procedures and diagnostic studies:     Ct Abd Pelvis Wo Contrast 08/02/2014 Right middle and lower lobe pneumonia with small right pleural effusion. 2. At least 3 nonspecific hepatic lesions. Consider elective outpatient MR liver with contrast for further characterization, to differentiate benign from malignant etiologies. 3. 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 Reactive mesenteric adenitis and lymphoma would be the primary considerations. 4. Cholelithiasis  CXR 08/02/2014 RIGHT middle lobe consolidation consistent with pneumonia, with probable mild additional infiltrate throughout remainder of RIGHT lung.   Medical Consultants:  None   Other Consultants:  None   IAnti-Infectives:   Zithromax 5/7 --> Rocephin 5/7 -->  Faye Ramsay, MD  [facility] Pager [phone]  If 7PM-7AM, please contact night-coverage [email or web address] Password [email or web address] 08/03/2014, 11:18 AM   LOS: 1 day   HPI/Subjective: No events overnight. Still with watery diarrhea and exertional dyspnea, cough.   Objective: Filed Vitals:   08/03/14 0700 08/03/14 0726 08/03/14 0800 08/03/14 1000  BP: 101/56  112/41 101/52  Pulse: 117  123 120  Temp:   97.4 F (36.3 C)   TempSrc:    Oral   Resp: _0 Height:      Weight:      SpO2: 93% 92% 91% 93%    Intake/Output Summary (Last 24 hours) at 08/03/14 1118 Last data filed at 08/03/14 0800  Gross per 24 hour  Intake 2089.17 ml  Output    450 ml  Net 1639.17 ml    Exam:   General:  Pt is alert, follows commands appropriately, not in acute distress  Cardiovascular: Regular rhythm, tachycardic, S1/S2, no rubs, no gallops  Respiratory: Course breath sounds bilaterally with no wheezing, diminished breath sounds at bases  Abdomen: Soft, non tender, non distended, bowel sounds present, no guarding  Extremities: pulses DP and PT palpable bilaterally  Neuro: Grossly nonfocal  Data Reviewed: Basic Metabolic Panel:  Recent Labs Lab 08/02/14 1131 08/02/14 1628 08/02/14 2030 08/03/14 0350  NA 130*  --   --  135  K 4.5  --   --  3.6  CL 101  --   --  108  CO2 18*  --   --  14*  GLUCOSE 120*  --   --  113*  BUN  30*  --   --  33*  CREATININE 1.87*  --  1.46* 1.11*  CALCIUM 8.7*  --   --  7.9*  MG  --  1.9  --   --   PHOS  --  2.6  --   --    Liver Function Tests:  Recent Labs Lab 08/02/14 1131  AST 59*  ALT 31  ALKPHOS 88  BILITOT 1.9*  PROT 6.6  ALBUMIN 3.2*    Recent Labs Lab 08/02/14 1131  LIPASE 13*   CBC:  Recent Labs Lab 08/02/14 1131 08/02/14 2030 08/03/14 0350  WBC 24.1* 11.8* 16.9*  NEUTROABS 22.1* 10.6*  --   HGB 12.6 11.1* 12.2  HCT 37.7 33.3* 37.9  MCV 91.1 91.2 92.4  PLT 292 215 229     Recent Results (from the past 240 hour(s))  MRSA PCR Screening     Status: None   Collection Time: 08/02/14  7:00 PM  Result Value Ref Range Status   MRSA by PCR NEGATIVE NEGATIVE Final    Comment:        The GeneXpert MRSA Assay (FDA approved for NASAL specimens only), is one component of a comprehensive MRSA colonization surveillance program. It is not intended to diagnose MRSA infection nor to guide or monitor treatment for MRSA infections.      Scheduled  Meds: . antiseptic oral rinse  7 mL Mouth Rinse BID  . azelastine  1 spray Each Nare QHS  . azithromycin  500 mg Intravenous Q24H  . budesonide-formoterol  2 puff Inhalation BID  . cefTRIAXone (ROCEPHIN)  IV  1 g Intravenous Q24H  . enoxaparin (LOVENOX) injection  40 mg Subcutaneous Q24H  . fluticasone  1-2 spray Each Nare QHS  . guaiFENesin  600 mg Oral BID  . levalbuterol  0.63 mg Nebulization Q6H  . methylPREDNISolone (SOLU-MEDROL) injection  40 mg Intravenous Q12H  . raloxifene  60 mg Oral Q breakfast   Continuous Infusions: . sodium chloride 50 mL/hr at 08/02/14 2013

## 2014-08-03 NOTE — Progress Notes (Signed)
Pt  Just became confused and pulled off all her electrodes stating that the "technician" forgot take them when she finished her test. Pt re-oriented easily and placed back on monitor. Shelbie Franken, Beverly Gust, RN

## 2014-08-03 NOTE — Progress Notes (Signed)
Pt remains SOB at rest with increased HR with any exertion into 120-130 range, she desats into upper 80's while using the BSC. on [place] 2 LPM. Adiva Boettner, Beverly Gust, RN

## 2014-08-03 NOTE — Progress Notes (Signed)
  Echocardiogram 2D Echocardiogram has been performed.  Lysle Rubens 08/03/2014, 12:52 PM

## 2014-08-04 DIAGNOSIS — E871 Hypo-osmolality and hyponatremia: Secondary | ICD-10-CM

## 2014-08-04 LAB — CBC
HCT: 32 % — ABNORMAL LOW (ref 36.0–46.0)
HEMOGLOBIN: 10.6 g/dL — AB (ref 12.0–15.0)
MCH: 29.9 pg (ref 26.0–34.0)
MCHC: 33.1 g/dL (ref 30.0–36.0)
MCV: 90.4 fL (ref 78.0–100.0)
Platelets: 264 10*3/uL (ref 150–400)
RBC: 3.54 MIL/uL — ABNORMAL LOW (ref 3.87–5.11)
RDW: 14.2 % (ref 11.5–15.5)
WBC: 24.9 10*3/uL — ABNORMAL HIGH (ref 4.0–10.5)

## 2014-08-04 LAB — LEGIONELLA ANTIGEN, URINE

## 2014-08-04 LAB — BASIC METABOLIC PANEL
Anion gap: 7 (ref 5–15)
BUN: 16 mg/dL (ref 6–20)
CALCIUM: 8.6 mg/dL — AB (ref 8.9–10.3)
CHLORIDE: 109 mmol/L (ref 101–111)
CO2: 24 mmol/L (ref 22–32)
Creatinine, Ser: 0.52 mg/dL (ref 0.44–1.00)
GFR calc Af Amer: >60 mL/min (ref >60)
GFR calc non Af Amer: >60 mL/min (ref >60)
Glucose, Bld: 158 mg/dL — ABNORMAL HIGH (ref 70–99)
Potassium: 2.7 mmol/L — CL (ref 3.5–5.1)
SODIUM: 140 mmol/L (ref 135–145)

## 2014-08-04 LAB — MAGNESIUM: Magnesium: 2.3 mg/dL (ref 1.7–2.4)

## 2014-08-04 LAB — LACTIC ACID, PLASMA: Lactic Acid, Venous: 1.9 mmol/L (ref 0.5–2.0)

## 2014-08-04 MED ORDER — POTASSIUM CHLORIDE 10 MEQ/100ML IV SOLN
10.0000 meq | INTRAVENOUS | Status: AC
  Administered 2014-08-04 (×4): 10 meq via INTRAVENOUS
  Filled 2014-08-04 (×4): qty 100

## 2014-08-04 MED ORDER — LEVALBUTEROL HCL 0.63 MG/3ML IN NEBU
0.6300 mg | INHALATION_SOLUTION | Freq: Three times a day (TID) | RESPIRATORY_TRACT | Status: DC
  Administered 2014-08-04 (×3): 0.63 mg via RESPIRATORY_TRACT
  Filled 2014-08-04 (×3): qty 3

## 2014-08-04 MED ORDER — FUROSEMIDE 10 MG/ML IJ SOLN
40.0000 mg | Freq: Once | INTRAMUSCULAR | Status: AC
  Administered 2014-08-04: 40 mg via INTRAVENOUS
  Filled 2014-08-04: qty 4

## 2014-08-04 MED ORDER — ALBUTEROL (5 MG/ML) CONTINUOUS INHALATION SOLN
10.0000 mg/h | INHALATION_SOLUTION | RESPIRATORY_TRACT | Status: DC
  Administered 2014-08-04: 10 mg/h via RESPIRATORY_TRACT
  Filled 2014-08-04: qty 20

## 2014-08-04 NOTE — Evaluation (Signed)
Clinical/Bedside Swallow Evaluation Patient Details  Name: [name] MRN: [id number] Date of Birth: September 10, 1940  Today's Date: 08/04/2014 Time: SLP Start Time (ACUTE ONLY): 53 SLP Stop Time (ACUTE ONLY): 1254 SLP Time Calculation (min) (ACUTE ONLY): 34 min  Past Medical History:  Past Medical History  Diagnosis Date  . Asthma   . Macular degeneration   . Hearing loss     both ears, wwears hearing aides  . Osteopenia   . Arthritis   . 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 Abdominal hysterectomy    . Cesarean section      x 2  . Breast enhancement surgery    . Left heart catheterization with coronary angiogram N/A 01/24/2012    Procedure: LEFT HEART CATHETERIZATION WITH CORONARY ANGIOGRAM;  Surgeon: Sueanne Margarita, MD;  Location: [place] CATH [facility];  Service: Cardiovascular;  Laterality: N/A;  . Mastectomy  33 yrs ago    bilateral  . Eye surgery Bilateral march 2015 and june 2015    lens for cataracts  . Colonoscopy with propofol N/A 04/21/2014    Procedure: COLONOSCOPY WITH PROPOFOL;  Surgeon: Garlan Fair, MD;  Location: [facility] ENDOSCOPY;  Service: Endoscopy;  Laterality: N/A;   HPI:  74 yo female adm to [facility] 07/30/14 with 3 day h/o worsening dyspnea.  Pt found to have Sepsis secondary to pneumonia, right middle and lower lobe, unknown pathogen,diarrhea, Tachycardia with intermittent chest tightness , hepatic lesions, acute renal failure, Central mesenteric adenopathy identified on CT abdomen and pelvis.  Swallow evaluation ordered.     Assessment / Plan / Recommendation Clinical Impression  Pt with negative CN exam and without overt clinical indications of airway comrpomise with intake.  She demonstrated chronic throat clearing at baseline and states she is "allergic to life" indicating chronicity of throat clearing.  Swallow was timely with baseline voice throughout (appears mildly hoarse but pt  reports normal) and exhalation noted after swallow.    Pt takes her medicine with crackers at home to aid clearance.  Suspect may be indicative of mild esophageal deficits.   Pt provided with tips to mitigate aspiration risk due to her asthma/respiratory condition possibly allowing episodic aspiration.  Reviewed precautions/compensations orally and provided in writing.  No further SLP warranted as all education completed.      Aspiration Risk  Mild    Diet Recommendation Age appropriate regular solids;Thin   Medication Administration: Whole meds with liquid Compensations:  (drink  liquids t/o meal, esophageal/asp precautions, rest if short of breath)    Other  Recommendations Oral Care Recommendations: Patient independent with oral care   Follow Up Recommendations       Frequency and Duration        Pertinent Vitals/Pain Afebrile, decreased    Swallow Study Prior Functional Status   pt reports eating fast at home but denies dysphagia symptoms    General Date of Onset: 08/04/14 Other Pertinent Information: 74 yo female adm to [facility] 07/30/14 with 3 day h/o worsening dyspnea.  Pt found to have Sepsis secondary to pneumonia, right middle and lower lobe, unknown pathogen,diarrhea, Tachycardia with intermittent chest tightness , hepatic lesions, acute renal failure, Central mesenteric adenopathy identified on CT abdomen and pelvis.  Swallow evaluation ordered.   Type of Study: Bedside swallow evaluation Diet Prior to this Study: Regular;Thin liquids Temperature Spikes Noted: No Respiratory Status: Supplemental O2 delivered via (  comment) History of Recent Intubation: No Behavior/Cognition: Alert;Cooperative;Pleasant mood Oral Cavity - Dentition: Adequate natural dentition/normal for age Self-Feeding Abilities: Able to feed self Patient Positioning: Upright in bed Baseline Vocal Quality: Hoarse Volitional Cough: Strong Volitional Swallow: Able to elicit    Oral/Motor/Sensory Function  Overall Oral Motor/Sensory Function: Appears within functional limits for tasks assessed   Ice Chips Ice chips: Not tested   Thin Liquid Thin Liquid: Within functional limits Presentation: Self Fed;Cup;Straw    Nectar Thick Nectar Thick Liquid: Not tested   Honey Thick Honey Thick Liquid: Not tested   Puree Puree: Within functional limits Presentation: Spoon;Self Fed   Solid   GO    Solid: Within functional limits Presentation: [place], [place] [facility] SLP [phone]

## 2014-08-04 NOTE — Progress Notes (Signed)
Patient ID: [name], female   DOB: 03-29-1940, 74 y.o.   MRN: [id number]  [facility] PROGRESS NOTE  [name] OFB:[id number] DOB: February 12, 1941 DOA: 08/02/2014 PCP: Mayra Neer, MD   Brief narrative:    Patient is 74 year old female with history of asthma and hypertension, presented to [facility] emergency department with 3 days duration of progressively worsening dyspnea, initially started with exertion and has progressed to dyspnea at rest over the past 24 hours. This has been associated with subjective fevers and chills, productive cough of clear to yellow sputum, chest tightness that occurs with coughing spells. Patient also reports malaise and poor oral intake, watery diarrhea also several days in duration. She denies any specific alleviating or aggravating factors. She also denies recent sick contacts or exposures. She denies other abdominal or urinary concerns, no specific neurological symptoms other than generalized weakness.  In emergency department, patient noted to be in mild distress due to dyspnea. Vital signs notable for T 102.9 F, heart rate 140, RR 33, blood pressure 92/52, oxygen saturation 85% on room air. Blood work notable for WBC 24K, Cr 1.87. CT abd/pelvis concerning for developing right middle and lower lobe pneumonia. TRH asked to admit to step down unit for evaluation and management of presumptive sepsis sepsis secondary to pneumonia.   Assessment/Plan:    Active Problems: Sepsis secondary to pneumonia, right middle and lower lobe, unknown pathogen - Sepsis criteria met on admission with vital signs noted above, in addition to lactic acid 3.5, procalcitonin 86 - pt was started on Zithromax and Rocephin in ED and we have continued same ABX on admission  - WBC at 24 on last check - may need to broaden the ABX coverage to vanc and zosyn if pt starts to clinically deteriorate - keep in SDU for now - repeat lactic acid today, follow up on blood and sputum  cultures negative (sputum culture needs to be obtained), strep pneumo and urine legionella negative - UA with trace leukocytes, need to follow up on urine culture as well - keep on oxygen via [place] for now, since no wheezing this AM, change solumedrol to prednisone with planned tapering - continue bronchodilators scheduled and as needed   Acute respiratory failure with hypoxia - Secondary to pneumonia as outlined above in addition to small right pleural effusion  - continue Zithromax and rocephin day #3, but may need to broaden coverage depending on clinical progression  - 2 D ECHO requested and reporting normal EF - Keep on oxygen via [place]   Tachycardia with intermittent chest tightness  - Sinus rhythm, likely reactive secondary to sepsis as noted above - continue to provide metoprolol 5 mg IV every 4 hours as needed for heart rate > 115 - Last troponin negative. No chest pain reported. - 12 lead EHF 5/7 with sinus tachycardia and scattered PVC which are likely due to sepsis in the setting of PNA  - keep on telemetry monitor for now   Diarrhea - unclear etiology - C. Diff and stool panel still pending  - keep on enteric precaution for now   Acute renal failure - Prerenal in etiology from sepsis - Provided IV fluids, Cr trending down from 1.87 --> 1.46 --> 1.11 - continue IVF and repeat BMP in AM  Metabolic acidosis - resolved  Hypokalemia - Potassium replacement IV x 4 runs - reassess next am.  Hyponatremia - responded to IVF, now WNL  Several hepatic lesions - Incidental finding on CT abdomen and  pelvis - Recommendation is to obtain outpatient MR of the liver with contrast for further evaluation to differentiate benign from malignant etiologies  Central mesenteric adenopathy identified on CT abdomen and pelvis - Appears to be reactive mesenteric adenitis, lymphoma cannot be entirely excluded based on CT abdomen and pelvis - Will need close monitoring in an outpatient  setting  DVT prophylaxis - Lovenox SQ  Code Status: Full.  Family Communication:  plan of care discussed with the patient and daughter at bedside  Disposition Plan: Barriers to d/c --> still requiring IVF and IV ABX, stool panel and C. Diff pending along with blood, urine, sputum cultures. Pt is still fairly weak and needs continued monitoring in SDU for at least another 24 hours.   IV access:  Peripheral IV  Procedures and diagnostic studies:     Ct Abd Pelvis Wo Contrast 08/02/2014 Right middle and lower lobe pneumonia with small right pleural effusion. 2. At least 3 nonspecific hepatic lesions. Consider elective outpatient MR liver with contrast for further characterization, to differentiate benign from malignant etiologies. 3. 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 Reactive mesenteric adenitis and lymphoma would be the primary considerations. 4. Cholelithiasis  CXR 08/02/2014 RIGHT middle lobe consolidation consistent with pneumonia, with probable mild additional infiltrate throughout remainder of RIGHT lung.   Medical Consultants:  None   Other Consultants:  None   IAnti-Infectives:   Zithromax 5/7 --> Rocephin 5/7 -->  Velvet Bathe, MD  [facility] Pager [phone]  If 7PM-7AM, please contact night-coverage [email or web address] Password [email or web address] 08/04/2014, 3:56 PM   LOS: 2 days   HPI/Subjective: No events overnight. Reports breathing better today.  Objective: Filed Vitals:   08/04/14 1200 08/04/14 1300 08/04/14 1400 08/04/14 1441  BP:  104/46 136/60   Pulse:  115 112   Temp: 97.7 F (36.5 C)     TempSrc: Oral     Resp:  32 24   Height:      Weight:      SpO2:  92% 89% 89%    Intake/Output Summary (Last 24 hours) at 08/04/14 1556 Last data filed at 08/04/14 1200  Gross per 24 hour  Intake   1610 ml  Output   1850 ml  Net   -240 ml    Exam:   General:  Pt is alert, follows commands appropriately, not in acute  distress  Cardiovascular: Regular rhythm, tachycardic, S1/S2, no rubs, no gallops  Respiratory: NO wheezes, speaking in full sentences, rhales  Abdomen: Soft, non tender, non distended, bowel sounds present, no guarding  Extremities: no cyanosis  Neuro: Grossly nonfocal  Data Reviewed: Basic Metabolic Panel:  Recent Labs Lab 08/02/14 1131 08/02/14 1628 08/02/14 2030 08/03/14 0350 08/04/14 0350  NA 130*  --   --  135 140  K 4.5  --   --  3.6 2.7*  CL 101  --   --  108 109  CO2 18*  --   --  14* 24  GLUCOSE 120*  --   --  113* 158*  BUN 30*  --   --  33* 16  CREATININE 1.87*  --  1.46* 1.11* 0.52  CALCIUM 8.7*  --   --  7.9* 8.6*  MG  --  1.9  --   --  2.3  PHOS  --  2.6  --   --   --    Liver Function Tests:  Recent Labs Lab 08/02/14 1131  AST 59*  ALT 31  ALKPHOS 88  BILITOT  1.9*  PROT 6.6  ALBUMIN 3.2*    Recent Labs Lab 08/02/14 1131  LIPASE 13*   CBC:  Recent Labs Lab 08/02/14 1131 08/02/14 2030 08/03/14 0350 08/04/14 0350  WBC 24.1* 11.8* 16.9* 24.9*  NEUTROABS 22.1* 10.6*  --   --   HGB 12.6 11.1* 12.2 10.6*  HCT 37.7 33.3* 37.9 32.0*  MCV 91.1 91.2 92.4 90.4  PLT 292 215 229 264     Recent Results (from the past 240 hour(s))  Blood culture (routine x 2)     Status: None (Preliminary result)   Collection Time: 08/02/14  3:32 PM  Result Value Ref Range Status   Specimen Description BLOOD RIGHT ANTECUBITAL  Final   Special Requests BOTTLES DRAWN AEROBIC AND ANAEROBIC 4 CC EACH  Final   Culture   Final           BLOOD CULTURE RECEIVED NO GROWTH TO DATE CULTURE WILL BE HELD FOR 5 DAYS BEFORE ISSUING A FINAL NEGATIVE REPORT Performed at [facility]    Report Status PENDING  Incomplete  Blood culture (routine x 2)     Status: None (Preliminary result)   Collection Time: 08/02/14  3:45 PM  Result Value Ref Range Status   Specimen Description BLOOD RIGHT ANTECUBITAL  Final   Special Requests BOTTLES DRAWN AEROBIC AND ANAEROBIC 5  CC EACH  Final   Culture   Final           BLOOD CULTURE RECEIVED NO GROWTH TO DATE CULTURE WILL BE HELD FOR 5 DAYS BEFORE ISSUING A FINAL NEGATIVE REPORT Performed at [facility]    Report Status PENDING  Incomplete  MRSA PCR Screening     Status: None   Collection Time: 08/02/14  7:00 PM  Result Value Ref Range Status   MRSA by PCR NEGATIVE NEGATIVE Final    Comment:        The GeneXpert MRSA Assay (FDA approved for NASAL specimens only), is one component of a comprehensive MRSA colonization surveillance program. It is not intended to diagnose MRSA infection nor to guide or monitor treatment for MRSA infections.   Urine culture     Status: None   Collection Time: 08/02/14  8:38 PM  Result Value Ref Range Status   Specimen Description URINE, RANDOM  Final   Special Requests NONE  Final   Colony Count NO GROWTH Performed at [facility]   Final   Culture NO GROWTH Performed at [facility]   Final   Report Status 08/03/2014 FINAL  Final  Clostridium Difficile by PCR     Status: None   Collection Time: 08/03/14 10:13 AM  Result Value Ref Range Status   C difficile by pcr NEGATIVE NEGATIVE Final     Scheduled Meds: . antiseptic oral rinse  7 mL Mouth Rinse BID  . azelastine  1 spray Each Nare QHS  . azithromycin  500 mg Intravenous Q24H  . budesonide-formoterol  2 puff Inhalation BID  . cefTRIAXone (ROCEPHIN)  IV  1 g Intravenous Q24H  . enoxaparin (LOVENOX) injection  40 mg Subcutaneous Q24H  . fluticasone  1-2 spray Each Nare QHS  . guaiFENesin  600 mg Oral BID  . levalbuterol  0.63 mg Nebulization TID  . predniSONE  50 mg Oral Q breakfast  . raloxifene  60 mg Oral Q breakfast   Continuous Infusions: . sodium chloride    .  sodium bicarbonate  infusion 1000 mL 50 mL/hr at 08/04/14 [phone]

## 2014-08-04 NOTE — Progress Notes (Signed)
CRITICAL VALUE ALERT  Critical value received: Potassium 2.7  Date of notification:  08/04/2014  Time of notification:  8416  Critical value read back:Yes.    Nurse who received alert:  Javier Glazier  MD notified (1st page):  Fredirick Maudlin  Time of first page:  [phone] MD notified (2nd page):  Time of second page:  Responding MD:  Fredirick Maudlin   Time MD responded:  [phone]

## 2014-08-04 NOTE — Progress Notes (Signed)
Date:  Aug 04, 2014 U.R. performed for needs and level of care. Will continue to follow for Case Management needs.  Dalores Weger, RN, BSN, CCM   [phone] 

## 2014-08-04 NOTE — Progress Notes (Signed)
PT Cancellation Note  Patient Details Name: [name] MRN: [id number] DOB: [birth year]   Cancelled Treatment:    Reason Eval/Treat Not Completed:  (patient had visitors just entered room. RN reports that patient has been up to [facility]. will check back in AM.\)   [name] 08/04/2014, 5:19 PM [name] PT [phone]

## 2014-08-05 ENCOUNTER — Inpatient Hospital Stay (HOSPITAL_COMMUNITY): Payer: Commercial Managed Care - HMO

## 2014-08-05 LAB — COMPREHENSIVE METABOLIC PANEL
ALT: 44 U/L (ref 14–54)
AST: 57 U/L — ABNORMAL HIGH (ref 15–41)
Albumin: 2.2 g/dL — ABNORMAL LOW (ref 3.5–5.0)
Alkaline Phosphatase: 116 U/L (ref 38–126)
Anion gap: 6 (ref 5–15)
BUN: 13 mg/dL (ref 6–20)
CALCIUM: 8.5 mg/dL — AB (ref 8.9–10.3)
CO2: 35 mmol/L — ABNORMAL HIGH (ref 22–32)
CREATININE: 0.53 mg/dL (ref 0.44–1.00)
Chloride: 103 mmol/L (ref 101–111)
Glucose, Bld: 142 mg/dL — ABNORMAL HIGH (ref 70–99)
Potassium: 2.8 mmol/L — ABNORMAL LOW (ref 3.5–5.1)
Sodium: 144 mmol/L (ref 135–145)
Total Bilirubin: 0.6 mg/dL (ref 0.3–1.2)
Total Protein: 5.4 g/dL — ABNORMAL LOW (ref 6.5–8.1)

## 2014-08-05 LAB — BLOOD GAS, ARTERIAL
Acid-Base Excess: 12.8 mmol/L — ABNORMAL HIGH (ref 0.0–2.0)
Bicarbonate: 37.5 mEq/L — ABNORMAL HIGH (ref 20.0–24.0)
Drawn by: 31814
FIO2: 1 %
O2 Saturation: 87 %
Patient temperature: 98.2
TCO2: 33.2 mmol/L (ref 0–100)
pCO2 arterial: 46.9 mmHg — ABNORMAL HIGH (ref 35.0–45.0)
pH, Arterial: 7.513 — ABNORMAL HIGH (ref 7.350–7.450)
pO2, Arterial: 48.3 mmHg — ABNORMAL LOW (ref 80.0–100.0)

## 2014-08-05 LAB — GI PATHOGEN PANEL BY PCR, STOOL
C difficile toxin A/B: NOT DETECTED
Campylobacter by PCR: NOT DETECTED
Cryptosporidium by PCR: NOT DETECTED
E COLI (ETEC) LT/ST: NOT DETECTED
E coli (STEC): NOT DETECTED
E coli 0157 by PCR: NOT DETECTED
G LAMBLIA BY PCR: NOT DETECTED
NOROVIRUS G1/G2: NOT DETECTED
Rotavirus A by PCR: NOT DETECTED
SHIGELLA BY PCR: NOT DETECTED
Salmonella by PCR: NOT DETECTED

## 2014-08-05 LAB — CBC
HEMATOCRIT: 32.1 % — AB (ref 36.0–46.0)
Hemoglobin: 10.5 g/dL — ABNORMAL LOW (ref 12.0–15.0)
MCH: 29.7 pg (ref 26.0–34.0)
MCHC: 32.7 g/dL (ref 30.0–36.0)
MCV: 90.9 fL (ref 78.0–100.0)
Platelets: 271 10*3/uL (ref 150–400)
RBC: 3.53 MIL/uL — AB (ref 3.87–5.11)
RDW: 14.4 % (ref 11.5–15.5)
WBC: 41.3 10*3/uL — ABNORMAL HIGH (ref 4.0–10.5)

## 2014-08-05 LAB — POTASSIUM: Potassium: 4 mmol/L (ref 3.5–5.1)

## 2014-08-05 LAB — BRAIN NATRIURETIC PEPTIDE: B Natriuretic Peptide: 519.1 pg/mL — ABNORMAL HIGH (ref 0.0–100.0)

## 2014-08-05 MED ORDER — LEVALBUTEROL HCL 1.25 MG/0.5ML IN NEBU
INHALATION_SOLUTION | RESPIRATORY_TRACT | Status: AC
  Administered 2014-08-05: 1.25 mg via RESPIRATORY_TRACT
  Filled 2014-08-05: qty 0.5

## 2014-08-05 MED ORDER — IPRATROPIUM-ALBUTEROL 0.5-2.5 (3) MG/3ML IN SOLN
3.0000 mL | RESPIRATORY_TRACT | Status: DC | PRN
  Filled 2014-08-05: qty 3

## 2014-08-05 MED ORDER — POTASSIUM CHLORIDE CRYS ER 20 MEQ PO TBCR: 30.0000 meq | EXTENDED_RELEASE_TABLET | Freq: Once | ORAL | Status: DC

## 2014-08-05 MED ORDER — LEVALBUTEROL HCL 1.25 MG/0.5ML IN NEBU
1.2500 mg | INHALATION_SOLUTION | Freq: Four times a day (QID) | RESPIRATORY_TRACT | Status: DC
  Administered 2014-08-05 – 2014-08-06 (×6): 1.25 mg via RESPIRATORY_TRACT
  Filled 2014-08-05 (×6): qty 0.5

## 2014-08-05 MED ORDER — POTASSIUM CHLORIDE 20 MEQ/15ML (10%) PO SOLN
40.0000 meq | Freq: Once | ORAL | Status: AC
  Administered 2014-08-05: 40 meq via ORAL
  Filled 2014-08-05: qty 30

## 2014-08-05 MED ORDER — MORPHINE SULFATE 2 MG/ML IJ SOLN
1.0000 mg | INTRAMUSCULAR | Status: DC | PRN
  Administered 2014-08-05 – 2014-08-12 (×13): 1 mg via INTRAVENOUS
  Filled 2014-08-05 (×12): qty 1

## 2014-08-05 MED ORDER — VANCOMYCIN HCL 500 MG IV SOLR
500.0000 mg | Freq: Two times a day (BID) | INTRAVENOUS | Status: DC
  Administered 2014-08-05 – 2014-08-07 (×6): 500 mg via INTRAVENOUS
  Filled 2014-08-05 (×8): qty 500

## 2014-08-05 MED ORDER — METHYLPREDNISOLONE SODIUM SUCC 125 MG IJ SOLR
80.0000 mg | Freq: Once | INTRAMUSCULAR | Status: AC
  Administered 2014-08-05: 80 mg via INTRAVENOUS

## 2014-08-05 MED ORDER — POTASSIUM CHLORIDE CRYS ER 20 MEQ PO TBCR: 40.0000 meq | EXTENDED_RELEASE_TABLET | Freq: Once | ORAL | Status: DC

## 2014-08-05 MED ORDER — HYDRALAZINE HCL 20 MG/ML IJ SOLN
5.0000 mg | Freq: Four times a day (QID) | INTRAMUSCULAR | Status: DC | PRN
  Administered 2014-08-05: 5 mg via INTRAVENOUS
  Filled 2014-08-05: qty 1

## 2014-08-05 MED ORDER — TIOTROPIUM BROMIDE MONOHYDRATE 18 MCG IN CAPS
18.0000 ug | ORAL_CAPSULE | Freq: Every day | RESPIRATORY_TRACT | Status: DC
  Administered 2014-08-05 – 2014-08-06 (×2): 18 ug via RESPIRATORY_TRACT
  Filled 2014-08-05: qty 5

## 2014-08-05 MED ORDER — METHYLPREDNISOLONE SODIUM SUCC 40 MG IJ SOLR
40.0000 mg | Freq: Two times a day (BID) | INTRAMUSCULAR | Status: DC
  Administered 2014-08-05 – 2014-08-11 (×12): 40 mg via INTRAVENOUS
  Filled 2014-08-05 (×13): qty 1

## 2014-08-05 MED ORDER — LEVALBUTEROL HCL 1.25 MG/0.5ML IN NEBU
1.2500 mg | INHALATION_SOLUTION | RESPIRATORY_TRACT | Status: DC | PRN
  Administered 2014-08-05: 1.25 mg via RESPIRATORY_TRACT

## 2014-08-05 MED ORDER — FUROSEMIDE 10 MG/ML IJ SOLN
40.0000 mg | Freq: Once | INTRAMUSCULAR | Status: AC
  Administered 2014-08-05: 40 mg via INTRAVENOUS
  Filled 2014-08-05: qty 4

## 2014-08-05 MED ORDER — IPRATROPIUM-ALBUTEROL 0.5-2.5 (3) MG/3ML IN SOLN: 3.0000 mL | RESPIRATORY_TRACT | Status: DC

## 2014-08-05 MED ORDER — LORAZEPAM 2 MG/ML IJ SOLN
0.5000 mg | Freq: Once | INTRAMUSCULAR | Status: DC
  Filled 2014-08-05: qty 1

## 2014-08-05 NOTE — Progress Notes (Signed)
Patient ID: [name], female   DOB: Jun 04, 1940, 74 y.o.   MRN: [id number]  [facility] PROGRESS NOTE  Shannell Mikkelsen GQQ:[id number] DOB: [birth year] DOA: 08/02/2014 PCP: Mayra Neer, MD   Brief narrative:    Patient is 74 year old female with history of asthma and hypertension, presented to [facility] emergency department with 3 days duration of progressively worsening dyspnea, initially started with exertion and has progressed to dyspnea at rest over the past 24 hours. This has been associated with subjective fevers and chills, productive cough of clear to yellow sputum, chest tightness that occurs with coughing spells. Patient also reports malaise and poor oral intake, watery diarrhea also several days in duration. She denies any specific alleviating or aggravating factors. She also denies recent sick contacts or exposures. She denies other abdominal or urinary concerns, no specific neurological symptoms other than generalized weakness.  In emergency department, patient noted to be in mild distress due to dyspnea. Vital signs notable for T 102.9 F, heart rate 140, RR 33, blood pressure 92/52, oxygen saturation 85% on room air. Blood work notable for WBC 24K, Cr 1.87. CT abd/pelvis concerning for developing right middle and lower lobe pneumonia. TR[facility] asked to admit to step down unit for evaluation and management of presumptive sepsis sepsis secondary to pneumonia.   Assessment/Plan:    Active Problems: Sepsis secondary to pneumonia, right middle and lower lobe, unknown pathogen - Sepsis criteria met on admission with vital signs noted above, in addition to lactic acid 3.5, procalcitonin 86 - pt was started on Zithromax and Rocephin in ED, patient had increase in WBC level as such will add Vancomycin - keep in SDU for now - sputum culture needs to be obtained, strep pneumo and urine legionella negative - UA with trace leukocytes, need to follow up on urine culture as well  Acute  respiratory failure with hypoxia - In context of patient with history of asthma presenting with pneumonia. - Will change bronchodilator to xopenex, discontinue atrovent and placed on spiriva, unable to administer magnesium given that patient's magnesium level is at the hight normal range. - Patient with wheezes bilaterally at this juncture suspect asthma is also playing a role in hypoxia. As such will place on IV Solu-Medrol - Hypoxia also improved after Lasix administration with high urine outputs. Patient has history of diastolic dysfunction grade 1  Tachycardia with intermittent chest tightness  - In context of patient receiving albuterol periodically. - continue to provide metoprolol 5 mg IV every 4 hours as needed for heart rate > 115 - Last troponin negative. No chest pain reported. - 12 lead EHF 5/7 with sinus tachycardia and scattered PVC which are likely due to sepsis in the setting of PNA  - keep on telemetry monitor for now   Diarrhea - unclear etiology - C. Diff and stool panel still pending  - keep on enteric precaution for now   Acute renal failure - Prerenal in etiology from sepsis - Resolved and within normal limits on last check - Saline lock  Metabolic acidosis - resolved  Hypokalemia - Has been replaced orally - reassess next am.  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  Family Communication:  plan of care discussed with the patient and daughter at bedside   Disposition Plan: Barriers to d/c --> still requiring IVF and IV ABX, stool panel and C. Diff pending along with blood, urine, sputum cultures. Pt is still fairly weak and needs continued monitoring in SDU for at least another 24 hours.   IV access:  Peripheral IV  Procedures and diagnostic studies:     Ct Abd Pelvis Wo Contrast 08/02/2014 Right middle and lower lobe pneumonia with small right pleural effusion. 2. At least 3 nonspecific hepatic lesions. Consider elective outpatient MR liver with contrast for further characterization, to differentiate benign from malignant etiologies. 3. 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 Reactive mesenteric adenitis and lymphoma would be the primary considerations. 4. Cholelithiasis  CXR 08/02/2014 RIGHT middle lobe consolidation consistent with pneumonia, with probable mild additional infiltrate throughout remainder of RIGHT lung.   Medical Consultants:  None   Other Consultants:  None   IAnti-Infectives:   Zithromax 5/7 --> Rocephin 5/7 --> Vancomycin 5/10  Velvet Bathe, MD  [facility] Pager [phone]  If 7PM-7AM, please contact night-coverage [email or web address] Password [email or web address] 08/05/2014, 12:07 PM   LOS: 3 days   HPI/Subjective: Patient has no new complaints. No acute issues reported overnight  Objective: Filed Vitals:   08/05/14 0958 08/05/14 1000 08/05/14 1100 08/05/14 1200  BP:  150/82 155/76 172/84  Pulse:  120 116 113  Temp:      TempSrc:      Resp:  24 30 34  Height:      Weight:      SpO2: 88% 90% 90% 90%    Intake/Output Summary (Last 24 hours) at 08/05/14 1207 Last data filed at 08/05/14 1100  Gross per 24 hour  Intake 2795.83 ml  Output    825 ml  Net 1970.83 ml    Exam:   General:  Pt is alert, follows commands appropriately, not in acute distress  Cardiovascular: Regular rhythm, tachycardic, S1/S2, no rubs, no gallops  Respiratory: NO wheezes, speaking in full sentences,  rhales  Abdomen: Soft, non tender, non distended, bowel sounds present, no guarding  Extremities: no cyanosis or clubbing  Neuro: Grossly nonfocal  Data Reviewed: Basic Metabolic Panel:  Recent Labs Lab 08/02/14 1131 08/02/14 1628 08/02/14 2030 08/03/14 0350 08/04/14 0350 08/05/14 0346  NA 130*  --   --  135 140 144  K 4.5  --   --  3.6 2.7* 2.8*  CL 101  --   --  108 109 103  CO2 18*  --   --  14* 24 35*  GLUCOSE 120*  --   --  113* 158* 142*  BUN 30*  --   --  33* 16 13  CREATININE 1.87*  --  1.46* 1.11* 0.52 0.53  CALCIUM 8.7*  --   --  7.9* 8.6* 8.5*  MG  --  1.9  --   --  2.3  --   PHOS  --  2.6  --   --   --   --    Liver Function Tests:  Recent Labs Lab 08/02/14 1131 08/05/14 0346  AST 59* 57*  ALT 31 44  ALKPHOS 88 116  BILITOT 1.9* 0.6  PROT 6.6 5.4*  ALBUMIN 3.2* 2.2*    Recent Labs Lab 08/02/14 1131  LIPASE 13*  CBC:  Recent Labs Lab 08/02/14 1131 08/02/14 2030 08/03/14 0350 08/04/14 0350 08/05/14 0017  WBC 24.1* 11.8* 16.9* 24.9* 41.3*  NEUTROABS 22.1* 10.6*  --   --   --   HGB 12.6 11.1* 12.2 10.6* 10.5*  HCT 37.7 33.3* 37.9 32.0* 32.1*  MCV 91.1 91.2 92.4 90.4 90.9  PLT 292 215 229 264 271     Recent Results (from the past 240 hour(s))  Blood culture (routine x 2)     Status: None (Preliminary result)   Collection Time: 08/02/14  3:32 PM  Result Value Ref Range Status   Specimen Description BLOOD RIGHT ANTECUBITAL  Final   Special Requests BOTTLES DRAWN AEROBIC AND ANAEROBIC 4 CC EACH  Final   Culture   Final           BLOOD CULTURE RECEIVED NO GROWTH TO DATE CULTURE WILL BE HELD FOR 5 DAYS BEFORE ISSUING A FINAL NEGATIVE REPORT Performed at [facility]    Report Status PENDING  Incomplete  Blood culture (routine x 2)     Status: None (Preliminary result)   Collection Time: 08/02/14  3:45 PM  Result Value Ref Range Status   Specimen Description BLOOD RIGHT ANTECUBITAL  Final   Special Requests BOTTLES DRAWN  AEROBIC AND ANAEROBIC 5 CC EACH  Final   Culture   Final           BLOOD CULTURE RECEIVED NO GROWTH TO DATE CULTURE WILL BE HELD FOR 5 DAYS BEFORE ISSUING A FINAL NEGATIVE REPORT Performed at [facility]    Report Status PENDING  Incomplete  MRSA PCR Screening     Status: None   Collection Time: 08/02/14  7:00 PM  Result Value Ref Range Status   MRSA by PCR NEGATIVE NEGATIVE Final    Comment:        The GeneXpert MRSA Assay (FDA approved for NASAL specimens only), is one component of a comprehensive MRSA colonization surveillance program. It is not intended to diagnose MRSA infection nor to guide or monitor treatment for MRSA infections.   Urine culture     Status: None   Collection Time: 08/02/14  8:38 PM  Result Value Ref Range Status   Specimen Description URINE, RANDOM  Final   Special Requests NONE  Final   Colony Count NO GROWTH Performed at [facility]   Final   Culture NO GROWTH Performed at [facility]   Final   Report Status 08/03/2014 FINAL  Final  Clostridium Difficile by PCR     Status: None   Collection Time: 08/03/14 10:13 AM  Result Value Ref Range Status   C difficile by pcr NEGATIVE NEGATIVE Final     Scheduled Meds: . antiseptic oral rinse  7 mL Mouth Rinse BID  . azelastine  1 spray Each Nare QHS  . azithromycin  500 mg Intravenous Q24H  . budesonide-formoterol  2 puff Inhalation BID  . cefTRIAXone (ROCEPHIN)  IV  1 g Intravenous Q24H  . enoxaparin (LOVENOX) injection  40 mg Subcutaneous Q24H  . fluticasone  1-2 spray Each Nare QHS  . guaiFENesin  600 mg Oral BID  . levalbuterol  1.25 mg Nebulization 4 times per day  . methylPREDNISolone (SOLU-MEDROL) injection  40 mg Intravenous Q12H  . raloxifene  60 mg Oral Q breakfast  . tiotropium  18 mcg Inhalation Daily  . vancomycin  500 mg Intravenous Q12H   Continuous Infusions: . sodium chloride    . albuterol 10  mg/hr (08/04/14 2143)  .  sodium bicarbonate  infusion  1000 mL 50 mL/hr at 08/05/14 0800

## 2014-08-05 NOTE — Progress Notes (Signed)
MD is aware of pt situation. Continuing to has increased WOB with use of accessory muscles however O2 sat stable at 96% on NRB, HR low 100's, RR 22, and BP stable. MD plans to continue to monitor.No further orders given at this time.

## 2014-08-05 NOTE — Progress Notes (Signed)
PT Cancellation Note  Patient Details Name: [name] MRN: [id number] DOB: 26-Jul-1940   Cancelled Treatment:    Reason Eval/Treat Not Completed: Medical issues which prohibited therapy, respiratory distress overnight and now on NRB. Will check back this PM .   [name] 08/05/2014, 9:56 AM [name] PT [phone]

## 2014-08-05 NOTE — Progress Notes (Signed)
Upon shift assessment pt having increased WOB, dyspnea at rest, with use of accessory muscles and bilateral wheezing. Pt o2 saturations dropping to 86. Pt put on venturi mask, then NRB after sats continued to drop. After a couple minutes on NRB pt 02 sats stabilized at 95%.  MD notified of change in pt condition with orders for 1hr neb treatment. Metoprolol given prophylactically for HR and neb treatment given. After neb treatment pt put back on NRB. Although pt appeared to improve for a short amount of time, pt continued to wheeze bilaterally. New onset of crackles heard throughout the right lung with some fine crackles on the left side as well. MD notified again of lack of pt improvement. MD ordered foley and 40mg  of lasix.

## 2014-08-05 NOTE — Progress Notes (Signed)
ANTIBIOTIC CONSULT NOTE - INITIAL  Pharmacy Consult for vancomycin Indication: pneumonia  Allergies  Allergen Reactions  . Fosamax [Alendronate Sodium] Nausea Only  . Sulfonamide Derivatives Rash    Patient Measurements: Height: 4\' 11"  (149.9 cm) Weight: 142 lb 13.7 oz (64.8 kg) IBW/kg (Calculated) : 43.2   Vital Signs: Temp: 98.4 F (36.9 C) (05/10 0801) Temp Source: Oral (05/10 0801) BP: 167/74 mmHg (05/10 0900) Pulse Rate: 102 (05/10 0900) Intake/Output from previous day: 05/09 0701 - 05/10 0700 In: 2685.8 [I.V.:1135.8; IV Piggyback:550] Out: 1175 [ZOXWR:6045] Intake/Output from this shift: Total I/O In: 160 [P.O.:60; I.V.:100] Out: 125 [Urine:125]  Labs:  Recent Labs  08/03/14 0350 08/04/14 0350 08/05/14 0017 08/05/14 0346  WBC 16.9* 24.9* 41.3*  --   HGB 12.2 10.6* 10.5*  --   PLT 229 264 271  --   CREATININE 1.11* 0.52  --  0.53   Estimated Creatinine Clearance: 51.2 mL/min (by C-G formula based on Cr of 0.53). No results for input(s): VANCOTROUGH, VANCOPEAK, VANCORANDOM, GENTTROUGH, GENTPEAK, GENTRANDOM, TOBRATROUGH, TOBRAPEAK, TOBRARND, AMIKACINPEAK, AMIKACINTROU, AMIKACIN in the last 72 hours.   Microbiology: Recent Results (from the past 720 hour(s))  Blood culture (routine x 2)     Status: None (Preliminary result)   Collection Time: 08/02/14  3:32 PM  Result Value Ref Range Status   Specimen Description BLOOD RIGHT ANTECUBITAL  Final   Special Requests BOTTLES DRAWN AEROBIC AND ANAEROBIC 4 CC EACH  Final   Culture   Final           BLOOD CULTURE RECEIVED NO GROWTH TO DATE CULTURE WILL BE HELD FOR 5 DAYS BEFORE ISSUING A FINAL NEGATIVE REPORT Performed at [facility]    Report Status PENDING  Incomplete  Blood culture (routine x 2)     Status: None (Preliminary result)   Collection Time: 08/02/14  3:45 PM  Result Value Ref Range Status   Specimen Description BLOOD RIGHT ANTECUBITAL  Final   Special Requests BOTTLES DRAWN AEROBIC AND  ANAEROBIC 5 CC EACH  Final   Culture   Final           BLOOD CULTURE RECEIVED NO GROWTH TO DATE CULTURE WILL BE HELD FOR 5 DAYS BEFORE ISSUING A FINAL NEGATIVE REPORT Performed at [facility]    Report Status PENDING  Incomplete  MRSA PCR Screening     Status: None   Collection Time: 08/02/14  7:00 PM  Result Value Ref Range Status   MRSA by PCR NEGATIVE NEGATIVE Final    Comment:        The GeneXpert MRSA Assay (FDA approved for NASAL specimens only), is one component of a comprehensive MRSA colonization surveillance program. It is not intended to diagnose MRSA infection nor to guide or monitor treatment for MRSA infections.   Urine culture     Status: None   Collection Time: 08/02/14  8:38 PM  Result Value Ref Range Status   Specimen Description URINE, RANDOM  Final   Special Requests NONE  Final   Colony Count NO GROWTH Performed at [facility]   Final   Culture NO GROWTH Performed at [facility]   Final   Report Status 08/03/2014 FINAL  Final  Clostridium Difficile by PCR     Status: None   Collection Time: 08/03/14 10:13 AM  Result Value Ref Range Status   C difficile by pcr NEGATIVE NEGATIVE Final    Medical History: Past Medical History  Diagnosis Date  . Asthma   .  Macular degeneration   . Hearing loss     both ears, wwears hearing aides  . Osteopenia   . Arthritis   . Cancer of breast     33 years ago, left,    Assessment: Patient's a 74 y.o F with hx asthma and HTN who presented to the ED on 5/7 with c/o SOB.  Ceftriaxone and azithromycin were started on admission for suspected PNA.  She continues to have increased SOB this morning and wbc is elevated at 41.3.  To add vancomycin for broad coverage.  5/7 CTX/azithro>> 5/14 5/10 vanc>>  5/7 bcx x2: ngtd 5/7 ucx: neg FINAL 5/8 cdiff PCR ( -)  5/9 LA 1.9   Goal of Therapy:  Vancomycin trough level 15-20 mcg/ml  Plan:  - vancomycin 500mg  IV q12h - f/u cultures and  clinical status  Jersie Beel P 08/05/2014,10:11 AM

## 2014-08-05 NOTE — Progress Notes (Signed)
Shift event note:  Notified by RN at approx 2045 regarding her concern for pt's increased WOB and increased 02 demand. Sats dropped to mid 80's on Jet, pt transitioned to VM then NRB and sats up to 96% but WOB remained increased and BBS w/ diffuse wheezes and crackles. Saw pt at bedside. Noted w/ increase WOB (RR-30's) w/ some use of accessory muscles w/o overt resp distress. Pt still able to converse and smiles at intervals. BBS w/ diffuse exp wheezes and crackles R>L. Continuous albuterol neb given and RN reported some improvement in wheezing and slight improvement in WOB w/ persistent crackles R>L. Lasix 40 mg given IV and foley placed. At this time pt has diuresed >500cc and is sleeping w/ much improved WOB and minimal crackles over (R) lobe and LLL. Continues to desat to mid to high 80's w/o NRB.  Assessment/Plan: 1. Acute respiratory failure w/ hypoxia: In setting of RML and RLL PNA in pt w/ h/o asthma. No known h/o CHF.  Will add BNP to AM labs. Diffuse wheezing improved w/ continuous neb but mild wheezes persist. WOB and crackles improved after IV Lasix. 02 demand remains high. Continue NRB, IV Solu-Medrol and  PRN Xopenex nebs. Pt currently on Zithromax and Rocephin. Continue to monitor closely on SDU.   Jeryl Columbia, NP-C [facility] Pager [phone]

## 2014-08-06 ENCOUNTER — Inpatient Hospital Stay (HOSPITAL_COMMUNITY): Payer: Commercial Managed Care - HMO

## 2014-08-06 DIAGNOSIS — R590 Localized enlarged lymph nodes: Secondary | ICD-10-CM | POA: Diagnosis present

## 2014-08-06 DIAGNOSIS — S27309A Unspecified injury of lung, unspecified, initial encounter: Secondary | ICD-10-CM

## 2014-08-06 DIAGNOSIS — D72829 Elevated white blood cell count, unspecified: Secondary | ICD-10-CM

## 2014-08-06 DIAGNOSIS — J9601 Acute respiratory failure with hypoxia: Secondary | ICD-10-CM

## 2014-08-06 HISTORY — DX: Localized enlarged lymph nodes: R59.0

## 2014-08-06 LAB — BLOOD GAS, ARTERIAL
Acid-Base Excess: 14.4 mmol/L — ABNORMAL HIGH (ref 0.0–2.0)
Acid-Base Excess: 14 mmol/L — ABNORMAL HIGH (ref 0.0–2.0)
Acid-Base Excess: 14.2 mmol/L — ABNORMAL HIGH (ref 0.0–2.0)
Bicarbonate: 39.6 mEq/L — ABNORMAL HIGH (ref 20.0–24.0)
Bicarbonate: 38.9 mEq/L — ABNORMAL HIGH (ref 20.0–24.0)
Bicarbonate: 37.9 mEq/L — ABNORMAL HIGH (ref 20.0–24.0)
Delivery systems: POSITIVE
Drawn by: 31814
Drawn by: 307971
Drawn by: 308601
Expiratory PAP: 8
Expiratory PAP: 8
FIO2: 1 %
FIO2: 1 %
FIO2: 0.75 %
Inspiratory PAP: 12
Inspiratory PAP: 14
Mode: POSITIVE
Mode: POSITIVE
O2 Saturation: 95.5 %
O2 Saturation: 94.6 %
O2 Saturation: 94.8 %
PEEP: 8 cmH2O
Patient temperature: 98.1
Patient temperature: 98.6
Patient temperature: 98.6
Pressure support: 4 cmH2O
RATE: 8 resp/min
RATE: 8 resp/min
TCO2: 35.4 mmol/L (ref 0–100)
TCO2: 34 mmol/L (ref 0–100)
TCO2: 33.2 mmol/L (ref 0–100)
pCO2 arterial: 49.8 mmHg — ABNORMAL HIGH (ref 35.0–45.0)
pCO2 arterial: 47.7 mmHg — ABNORMAL HIGH (ref 35.0–45.0)
pCO2 arterial: 39.8 mmHg (ref 35.0–45.0)
pH, Arterial: 7.51 — ABNORMAL HIGH (ref 7.350–7.450)
pH, Arterial: 7.522 — ABNORMAL HIGH (ref 7.350–7.450)
pH, Arterial: 7.585 — ABNORMAL HIGH (ref 7.350–7.450)
pO2, Arterial: 73.8 mmHg — ABNORMAL LOW (ref 80.0–100.0)
pO2, Arterial: 68.6 mmHg — ABNORMAL LOW (ref 80.0–100.0)
pO2, Arterial: 67.4 mmHg — ABNORMAL LOW (ref 80.0–100.0)

## 2014-08-06 LAB — CBC
HEMATOCRIT: 34.1 % — AB (ref 36.0–46.0)
Hemoglobin: 10.7 g/dL — ABNORMAL LOW (ref 12.0–15.0)
MCH: 29.2 pg (ref 26.0–34.0)
MCHC: 31.4 g/dL (ref 30.0–36.0)
MCV: 93.2 fL (ref 78.0–100.0)
Platelets: 299 10*3/uL (ref 150–400)
RBC: 3.66 MIL/uL — ABNORMAL LOW (ref 3.87–5.11)
RDW: 14.8 % (ref 11.5–15.5)
WBC: 39.2 10*3/uL — ABNORMAL HIGH (ref 4.0–10.5)

## 2014-08-06 LAB — BASIC METABOLIC PANEL
Anion gap: 13 (ref 5–15)
BUN: 17 mg/dL (ref 6–20)
CHLORIDE: 96 mmol/L — AB (ref 101–111)
CO2: 38 mmol/L — AB (ref 22–32)
CREATININE: 0.43 mg/dL — AB (ref 0.44–1.00)
Calcium: 8.4 mg/dL — ABNORMAL LOW (ref 8.9–10.3)
GFR calc Af Amer: >60 mL/min (ref >60)
GFR calc non Af Amer: >60 mL/min (ref >60)
Glucose, Bld: 159 mg/dL — ABNORMAL HIGH (ref 70–99)
Potassium: 3.7 mmol/L (ref 3.5–5.1)
Sodium: 147 mmol/L — ABNORMAL HIGH (ref 135–145)

## 2014-08-06 MED ORDER — CETYLPYRIDINIUM CHLORIDE 0.05 % MT LIQD
7.0000 mL | Freq: Two times a day (BID) | OROMUCOSAL | Status: DC
  Administered 2014-08-06 – 2014-08-13 (×12): 7 mL via OROMUCOSAL

## 2014-08-06 MED ORDER — IPRATROPIUM-ALBUTEROL 0.5-2.5 (3) MG/3ML IN SOLN
3.0000 mL | Freq: Four times a day (QID) | RESPIRATORY_TRACT | Status: DC
  Administered 2014-08-07 – 2014-08-13 (×27): 3 mL via RESPIRATORY_TRACT
  Filled 2014-08-06 (×27): qty 3

## 2014-08-06 MED ORDER — BUDESONIDE 0.25 MG/2ML IN SUSP
0.2500 mg | Freq: Four times a day (QID) | RESPIRATORY_TRACT | Status: DC
  Administered 2014-08-07 (×2): 0.25 mg via RESPIRATORY_TRACT
  Filled 2014-08-06 (×2): qty 2

## 2014-08-06 MED ORDER — LOPERAMIDE HCL 2 MG PO CAPS: 2.0000 mg | ORAL_CAPSULE | ORAL | Status: DC | PRN

## 2014-08-06 MED ORDER — CHLORHEXIDINE GLUCONATE 0.12 % MT SOLN
15.0000 mL | Freq: Two times a day (BID) | OROMUCOSAL | Status: DC
  Administered 2014-08-06 – 2014-08-09 (×7): 15 mL via OROMUCOSAL
  Filled 2014-08-06 (×7): qty 15

## 2014-08-06 MED ORDER — FUROSEMIDE 10 MG/ML IJ SOLN
40.0000 mg | Freq: Once | INTRAMUSCULAR | Status: AC
  Administered 2014-08-06: 40 mg via INTRAVENOUS
  Filled 2014-08-06: qty 4

## 2014-08-06 MED ORDER — ALBUTEROL SULFATE (2.5 MG/3ML) 0.083% IN NEBU: 2.5000 mg | INHALATION_SOLUTION | RESPIRATORY_TRACT | Status: DC | PRN

## 2014-08-06 MED ORDER — FUROSEMIDE 10 MG/ML IJ SOLN
80.0000 mg | Freq: Three times a day (TID) | INTRAMUSCULAR | Status: DC
  Administered 2014-08-06: 80 mg via INTRAVENOUS
  Filled 2014-08-06: qty 8

## 2014-08-06 NOTE — Progress Notes (Signed)
[facility] Progress Note Patient Name: [name] DOB: 01-03-1941 MRN: [id number]   Date of Service  08/06/2014  HPI/Events of Note  Asked to camera by APP - Paull Hoffmanh. D/w case with him. Sounds like ALI with 100% face mask need. WOB - mild to mod distress  112 MAP  eICU Interventions  BiPAP Aggressive lasix Intubate if fails     Intervention Category Major Interventions: Respiratory failure - evaluation and management  Donyel Castagnola 08/06/2014, 11:24 PM

## 2014-08-06 NOTE — Progress Notes (Signed)
PT Cancellation Note  Patient Details Name: [name] MRN: [id number] DOB: December 31, 1940   Cancelled Treatment:     PT deferred this date at request of RN 2* pt's SOB at rest.  Will follow.   [name] 08/06/2014, 10:30 AM

## 2014-08-06 NOTE — Progress Notes (Signed)
Enteric precautions d'cd; stool viral panel and c-diff negative. Radie Berges, Beverly Gust, RN

## 2014-08-06 NOTE — Plan of Care (Signed)
Problem: Phase I Progression Outcomes Goal: Dyspnea controlled at rest Outcome: Not Progressing SOB at rest; desats into upper 80s with any exertion Goal: Progress activity as tolerated unless otherwise ordered Outcome: Progressing OOB to chair Goal: Tolerating diet Outcome: Not Progressing Poor appetite

## 2014-08-06 NOTE — Progress Notes (Signed)
PROGRESS NOTE  [name] NGE:[id number] DOB: [birth year] DOA: 08/02/2014 PCP: Mayra Neer, MD  HPI/Recap of past 24 hours: Patient is a 74 year old female past history of asthma and hypertension admitted on 5/7 with several days of progressively worsening shortness of breath along with productive cough with yellowish sputum and subjective fevers and chills.  Patient started on IV fluids and antibiotics, her white blood cell count has progressively worsened.  reported history of diarrhea before coming in, however C. difficile cultures have remained negative. Overnight, more tachypneic and requiring additional oxygenation. Chest x-ray is no large amount of pleural effusion and progressive airspace disease. Given Lasix and diuresed over a liter and a short amount of time. Patient today. Still feels short of breath although she is alert and oriented and otherwise with no complaints.   Assessment/Plan: Principal Problem:  Sepsis due to community-acquired pneumonia involving right middle and lower lobes causing Acute respiratory failure with hypoxia: Patient met criteria for sepsis based on tachycardia, tachypnea, hypotension, oxygen desaturation and elevated lactic acid level with pulmonary source. Has responded well to IV antibiotics, however leukocytosis remaining persistent and now respiratory function worsening. Some of this may be from volume overload and she is responding well to Lasix. We'll continue Lasix, however she remains tachypneic with poor respiratory function, have spoken to pulmonary will plan for intubation  Active Problems:   Sepsis due to pneumonia   Abdominal lymphadenopathy  diarrhea: Unclear etiology. C. difficile and stool panel negative. Resolved.  Code Status: full code  Family Communication: daughter at the bedside  Disposition Plan: upgraded to ICU status   Consultants:  Pulmonary  Procedures:  None  Antibiotics:  IV Rocephin 5/7-present  IV Zithromax  5/7-present  IV vancomycin 5/10-present   Objective: BP 153/82 mmHg  Pulse 111  Temp(Src) 97.7 F (36.5 C) (Oral)  Resp 30  Ht _0  (1.499 m)  Wt 66.8 kg (147 lb 4.3 oz)  BMI 29.73 kg/m2  SpO2 91%  Intake/Output Summary (Last 24 hours) at 08/06/14 1457 Last data filed at 08/06/14 1400  Gross per 24 hour  Intake 1562.17 ml  Output   2270 ml  Net -707.83 ml   Filed Weights   08/04/14 0456 08/05/14 0500 08/06/14 0450  Weight: 66.4 kg (146 lb 6.2 oz) 64.8 kg (142 lb 13.7 oz) 66.8 kg (147 lb 4.3 oz)    Exam:   General:  alert and oriented 3, no acute distress, although tachypnea  Cardiovascular: regular rhythm, tachycardic  Respiratory: tachypneic, decreased breath sounds bibasilar  Abdomen: soft, nontender, nondistended, positive bowel sounds  Musculoskeletal: no clubbing or cyanosis or edema   Data Reviewed: Basic Metabolic Panel:  Recent Labs Lab 08/02/14 1131 08/02/14 1628 08/02/14 2030 08/03/14 0350 08/04/14 0350 08/05/14 0346 08/05/14 1242 08/06/14 0358  NA 130*  --   --  135 140 144  --  147*  K 4.5  --   --  3.6 2.7* 2.8* 4.0 3.7  CL 101  --   --  108 109 103  --  96*  CO2 18*  --   --  14* 24 35*  --  38*  GLUCOSE 120*  --   --  113* 158* 142*  --  159*  BUN 30*  --   --  33* 16 13  --  17  CREATININE 1.87*  --  1.46* 1.11* 0.52 0.53  --  0.43*  CALCIUM 8.7*  --   --  7.9* 8.6* 8.5*  --  8.4*  MG  --  1.9  --   --  2.3  --   --   --   PHOS  --  2.6  --   --   --   --   --   --    Liver Function Tests:  Recent Labs Lab 08/02/14 1131 08/05/14 0346  AST 59* 57*  ALT 31 44  ALKPHOS 88 116  BILITOT 1.9* 0.6  PROT 6.6 5.4*  ALBUMIN 3.2* 2.2*    Recent Labs Lab 08/02/14 1131  LIPASE 13*   No results for input(s): AMMONIA in the last 168 hours. CBC:  Recent Labs Lab 08/02/14 1131 08/02/14 2030 08/03/14 0350 08/04/14 0350 08/05/14 0017 08/06/14 0358  WBC 24.1* 11.8* 16.9* 24.9* 41.3* 39.2*  NEUTROABS 22.1* 10.6*  --   --    --   --   HGB 12.6 11.1* 12.2 10.6* 10.5* 10.7*  HCT 37.7 33.3* 37.9 32.0* 32.1* 34.1*  MCV 91.1 91.2 92.4 90.4 90.9 93.2  PLT 292 215 229 264 271 299   Cardiac Enzymes:    Recent Labs Lab 08/03/14 1349  TROPONINI <0.03   BNP (last 3 results)  Recent Labs  08/05/14 0007  BNP 519.1*    ProBNP (last 3 results) No results for input(s): PROBNP in the last 8760 hours.  CBG: No results for input(s): GLUCAP in the last 168 hours.  Recent Results (from the past 240 hour(s))  Blood culture (routine x 2)     Status: None (Preliminary result)   Collection Time: 08/02/14  3:32 PM  Result Value Ref Range Status   Specimen Description BLOOD RIGHT ANTECUBITAL  Final   Special Requests BOTTLES DRAWN AEROBIC AND ANAEROBIC 4 CC EACH  Final   Culture   Final           BLOOD CULTURE RECEIVED NO GROWTH TO DATE CULTURE WILL BE HELD FOR 5 DAYS BEFORE ISSUING A FINAL NEGATIVE REPORT Performed at [facility]    Report Status PENDING  Incomplete  Blood culture (routine x 2)     Status: None (Preliminary result)   Collection Time: 08/02/14  3:45 PM  Result Value Ref Range Status   Specimen Description BLOOD RIGHT ANTECUBITAL  Final   Special Requests BOTTLES DRAWN AEROBIC AND ANAEROBIC 5 CC EACH  Final   Culture   Final           BLOOD CULTURE RECEIVED NO GROWTH TO DATE CULTURE WILL BE HELD FOR 5 DAYS BEFORE ISSUING A FINAL NEGATIVE REPORT Performed at [facility]    Report Status PENDING  Incomplete  MRSA PCR Screening     Status: None   Collection Time: 08/02/14  7:00 PM  Result Value Ref Range Status   MRSA by PCR NEGATIVE NEGATIVE Final    Comment:        The GeneXpert MRSA Assay (FDA approved for NASAL specimens only), is one component of a comprehensive MRSA colonization surveillance program. It is not intended to diagnose MRSA infection nor to guide or monitor treatment for MRSA infections.   Urine culture     Status: None   Collection Time: 08/02/14   8:38 PM  Result Value Ref Range Status   Specimen Description URINE, RANDOM  Final   Special Requests NONE  Final   Colony Count NO GROWTH Performed at [facility]   Final   Culture NO GROWTH Performed at [facility]   Final   Report Status 08/03/2014 FINAL  Final  Clostridium Difficile by PCR     Status: None   Collection Time: 08/03/14 10:13 AM  Result Value Ref Range Status   C difficile by pcr NEGATIVE NEGATIVE Final     Studies: Dg Chest Port 1 View  08/05/2014   CLINICAL DATA:  74 year old female with shortness breath. History of asthma and breast cancer. Subsequent encounter.  EXAM: PORTABLE CHEST - 1 VIEW  COMPARISON:  08/02/2014  FINDINGS: Significant progression of asymmetric airspace disease. This may represent multi focal infectious infiltrates with pulmonary edema. Right-sided pleural effusion suspected. Follow-up until clearance recommended to exclude underlying malignancy.  No gross pneumothorax.  Calcified aorta.  Cardiomegaly.  IMPRESSION: Significant progression of asymmetric airspace disease. This may represent multi focal infectious infiltrates with pulmonary edema. Right-sided pleural effusion suspected. Follow-up until clearance recommended to exclude underlying malignancy.  Calcified slightly tortuous aorta.  Cardiomegaly.   Electronically Signed   By: Genia Del M.D.   On: 08/05/2014 07:58    Scheduled Meds: . antiseptic oral rinse  7 mL Mouth Rinse BID  . antiseptic oral rinse  7 mL Mouth Rinse q12n4p  . azelastine  1 spray Each Nare QHS  . azithromycin  500 mg Intravenous Q24H  . budesonide-formoterol  2 puff Inhalation BID  . cefTRIAXone (ROCEPHIN)  IV  1 g Intravenous Q24H  . chlorhexidine  15 mL Mouth Rinse BID  . enoxaparin (LOVENOX) injection  40 mg Subcutaneous Q24H  . fluticasone  1-2 spray Each Nare QHS  . furosemide  40 mg Intravenous Once  . guaiFENesin  600 mg Oral BID  . levalbuterol  1.25 mg Nebulization 4 times per day  .  LORazepam  0.5 mg Intravenous Once  . methylPREDNISolone (SOLU-MEDROL) injection  40 mg Intravenous Q12H  . raloxifene  60 mg Oral Q breakfast  . tiotropium  18 mcg Inhalation Daily  . vancomycin  500 mg Intravenous Q12H    Continuous Infusions: . sodium chloride 10 mL/hr at 08/06/14 0447  . albuterol 10 mg/hr (08/04/14 2143)     Time spent: 40 minutes  [facility] Pager [phone]. If 7PM-7AM, please contact night-coverage at [email or web address], password [facility] 08/06/2014, 2:57 PM  LOS: 4 days

## 2014-08-06 NOTE — Consult Note (Signed)
Name: [name] MRN: [id number] DOB: 08-27-1940    ADMISSION DATE:  08/02/2014 CONSULTATION DATE:  08/06/2014  REFERRING MD :  Dr. Maryland Pink  CHIEF COMPLAINT:  SOB  BRIEF PATIENT DESCRIPTION: 74 year old female admitted 5/7 with sepsis and hypoxic respiratory failure both secondary to PNA. She was started on empiric ABX for CAP, but did not improve as anticipated. She had progressive hypoxia and SOB eventually requiring BiPAP 5/10. 5/11 hypoxia remained and PCCM was consulted.   SIGNIFICANT EVENTS  5/7 admitted for CAP 5/10 hypoxemia worsened BiPAP started 5/11 persistent hypoxemia, PCCM consulted  STUDIES:  -CXR 10/18/11>>Chronic scarring and volume loss in the right middle lobe and lingula similar to prior examinations -Exhaled nitric oxide 12/07/11>>8 ppb (normal) -Lt heart cath 01/24/12 >> Normal coronaries, LVEDP 8 mmHg, EF 55% -PFT 12/05/12 >> FEV1 1.49 (82%), FEV1% 69, FEF 25-75% 1.09 (69%), TLC 4.38 (85%), DLCO 87%, borderline BD response -5/7 CT abd > Right middle and lower lobe pneumonia with small right pleural effusion. At least 3 nonspecific hepatic lesions. 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 Cholelithiasis -5/8 Echo > LVEF 50-55%, mild MR  HISTORY OF PRESENT ILLNESS:  74 year old female with PMH as below, which is significant for Asthma, (has seen [facility], also concern for OSA), HTN, and Breast Ca. She was followed in the pulmonary office by [facility] for dyspnea most recently in 2014.Marland Kitchen  PFT showed mild obstruction with small airway disease, borderline bronchodilator response, normal lung volumes and normal diffusion. This was thought to be secondary to asthma. 08/02/14 she presented to [facility] ED complaining of 3 day history of progressive dyspnea, fevers, and cough productive of yellow sputum. Also complained of several days watery diarrhea. In ED she was febrile with respiratory distress and was hypoxemic. She had leukocytosis (20), and elevated  lactic with AKI. CXR showed multifocal pna and she was admitted to the hospitalist team and treated for sepsis/CAP/hypoxemice respiratory failure with IVF, ABX, and supplemental O2. Dypsnea and hypoxemia persisted throughout admission and CXR had little improvement. 5/10 She had increased WOB requiring BiPAP. She was able to come off of this 5/11 AM. Pulmonary consult called 5/11.   PAST MEDICAL HISTORY :   has a past medical history of Asthma; Macular degeneration; Hearing loss; Osteopenia; Arthritis; and Cancer of breast.  has past surgical history that includes Abdominal hysterectomy; Cesarean section; Breast enhancement surgery; left heart catheterization with coronary angiogram (N/A, 01/24/2012); Mastectomy (33 yrs ago); Eye surgery (Bilateral, march 2015 and june 2015); and Colonoscopy with propofol (N/A, 04/21/2014). Prior to Admission medications   Medication Sig Start Date End Date Taking? Authorizing Provider  albuterol (PROAIR HFA) 108 (90 BASE) MCG/ACT inhaler Inhale 2 puffs into the lungs every 6 (six) hours as needed for wheezing. 04/19/12  Yes [name], MD  azelastine (ASTELIN) 137 MCG/SPRAY nasal spray Place 1 spray into the nose at bedtime. Use in each nostril as directed   Yes Historical Provider, MD  budesonide-formoterol (SYMBICORT) 80-4.5 MCG/ACT inhaler Inhale 2 puffs into the lungs 2 (two) times daily.   Yes Historical Provider, MD  calcium carbonate (TUMS) 500 MG chewable tablet Chew 1 tablet by mouth at bedtime.    Yes Historical Provider, MD  Cholecalciferol (VITAMIN D) 1000 UNITS capsule Take 1,000 Units by mouth daily with breakfast.    Yes Historical Provider, MD  fexofenadine (ALLEGRA) 180 MG tablet Take 180 mg by mouth daily with breakfast.    Yes Historical Provider, MD  fluticasone (FLONASE) 50  MCG/ACT nasal spray Place 1-2 sprays into both nostrils at bedtime.    Yes Historical Provider, MD  meloxicam (MOBIC) 15 MG tablet Take 15 mg by mouth daily.   Yes Historical  Provider, MD  Multiple Vitamins-Minerals (MULTI FOR HER 50+) TABS Take 1 tablet by mouth daily.   Yes Historical Provider, MD  Multiple Vitamins-Minerals (PRESERVISION/LUTEIN) CAPS Take 1 capsule by mouth 2 (two) times daily.   Yes Historical Provider, MD  Omega-3 Fatty Acids (FISH OIL) 1200 MG CAPS Take 1,200 mg by mouth at bedtime.   Yes Historical Provider, MD  raloxifene (EVISTA) 60 MG tablet Take 60 mg by mouth daily with breakfast.    Yes Historical Provider, MD  albuterol (PROVENTIL) (2.5 MG/3ML) 0.083% nebulizer solution Take 3 mLs (2.5 mg total) by nebulization every 6 (six) hours as needed. DX 493.90 Patient not taking: Reported on 04/07/2014 04/26/12   [name], MD   Allergies  Allergen Reactions  . Fosamax [Alendronate Sodium] Nausea Only  . Sulfonamide Derivatives Rash    FAMILY HISTORY:  family history includes Heart attack in her father. SOCIAL HISTORY:  reports that she has never smoked. She has never used smokeless tobacco. She reports that she drinks alcohol. She reports that she does not use illicit drugs.  REVIEW OF SYSTEMS:   Bolds are positive  Constitutional: weight loss, gain, night sweats, Fevers, chills, fatigue .  HEENT: headaches, Sore throat, sneezing, nasal congestion, post nasal drip, Difficulty swallowing, Tooth/dental problems, visual complaints visual changes, ear ache CV:  chest pain, radiates: ,Orthopnea, PND, swelling in lower extremities, dizziness, palpitations, syncope.  GI  heartburn, indigestion, abdominal pain, nausea, vomiting, diarrhea, change in bowel habits, loss of appetite, bloody stools.  Resp: cough, productive: , hemoptysis, dyspnea, chest pain, pleuritic.  Skin: rash or itching or icterus GU: dysuria, change in color of urine, urgency or frequency. flank pain, hematuria  MS: joint pain or swelling. decreased range of motion  Psych: change in mood or affect. depression or anxiety.  Neuro: difficulty with speech, weakness, numbness,  ataxia    SUBJECTIVE:   VITAL SIGNS: Temp:  [97.5 F (36.4 C)-98.6 F (37 C)] 98.6 F (37 C) (05/11 1934) Pulse Rate:  [98-122] 113 (05/11 1900) Resp:  [18-38] 29 (05/11 1900) BP: (120-179)/(66-97) 154/90 mmHg (05/11 1900) SpO2:  [88 %-98 %] 93 % (05/11 2039) FiO2 (%):  [100 %] 100 % (05/11 1445) Weight:  [66.8 kg (147 lb 4.3 oz)] 66.8 kg (147 lb 4.3 oz) (05/11 0450)  PHYSICAL EXAMINATION: General:  Elderly female on BiPAP in NAD Neuro:  Alert, oriented x 3, mild confusion (recent morphine administration). Follow commands. HEENT:  [place]/AT, no JVD, PERRL Cardiovascular:  Tachy, regular Lungs:  Coarse bilateral breath sounds R>L no wheeze Abdomen:  Soft, non-tender, non-distended Musculoskeletal:  No acute deformity or ROM limitaiton.  Skin:  Grossly intact   Recent Labs Lab 08/04/14 0350 08/05/14 0346 08/05/14 1242 08/06/14 0358  NA 140 144  --  147*  K 2.7* 2.8* 4.0 3.7  CL 109 103  --  96*  CO2 24 35*  --  38*  BUN 16 13  --  17  CREATININE 0.52 0.53  --  0.43*  GLUCOSE 158* 142*  --  159*    Recent Labs Lab 08/04/14 0350 08/05/14 0017 08/06/14 0358  HGB 10.6* 10.5* 10.7*  HCT 32.0* 32.1* 34.1*  WBC 24.9* 41.3* 39.2*  PLT 264 271 299   Dg Chest Port 1 View  08/06/2014   CLINICAL DATA:  74 year old female with a history of shortness of breath  EXAM: PORTABLE CHEST - 1 VIEW  COMPARISON:  Chest x-ray 08/05/2014, 08/02/2014, 10/17/2012. Abdominal CT 08/02/2014  FINDINGS: Cardiomediastinal silhouette likely unchanged, partially obscured by overlying lung/ pleural disease.  Atherosclerotic calcifications.  Persisting bilateral, right greater than left airspace disease. No pneumothorax.  Likely right-sided pleural effusion.  Surgical changes of breast prosthesis.  No displaced fracture.  IMPRESSION: Persisting bilateral airspace disease, worst on the right.  Right-sided pleural effusion.  Atherosclerosis.  Signed,  Dulcy Fanny. Earleen Newport, DO  [facility]  [facility]   Electronically Signed   By: Corrie Mckusick D.O.   On: 08/06/2014 13:10   Dg Chest Port 1 View  08/05/2014   CLINICAL DATA:  74 year old female with shortness breath. History of asthma and breast cancer. Subsequent encounter.  EXAM: PORTABLE CHEST - 1 VIEW  COMPARISON:  08/02/2014  FINDINGS: Significant progression of asymmetric airspace disease. This may represent multi focal infectious infiltrates with pulmonary edema. Right-sided pleural effusion suspected. Follow-up until clearance recommended to exclude underlying malignancy.  No gross pneumothorax.  Calcified aorta.  Cardiomegaly.  IMPRESSION: Significant progression of asymmetric airspace disease. This may represent multi focal infectious infiltrates with pulmonary edema. Right-sided pleural effusion suspected. Follow-up until clearance recommended to exclude underlying malignancy.  Calcified slightly tortuous aorta.  Cardiomegaly.   Electronically Signed   By: Genia Del M.D.   On: 08/05/2014 07:58    ASSESSMENT / PLAN:  Acute hypoxemic respiratory failure ALI Diffuse pulmonary infiltrates of uncertain etiology, CAP, atypical PNA, BOOP? Small R effusion ? OSA - Supplemental O2 to maintain SpO2 > 90% - Trend SpO2, no more ABG for now - BiPAP PRN for increased WOB or hypoxemia - Low threshold to intubate if worsens - Aggressive diuresis, keep negative balance - Continue systemic steroids - Continue empiric antibiotics - PCT alghorithm - Follow CXR - May need bronchoscopy pending CT scan  Asthma without acute exacerbation - Change home BDs to nebulized - Scheduled budesonide - PRN albuterol  Metabolic Alkalosis, diarrhea vs prolonged bicarb gtt - HCO3 gtt d/c'd 5/11 - PRN imodium - C-dif negative  Rest Per Primary  Georgann Housekeeper, AGACNP-BC [facility] Pager [phone] or [phone]  08/06/2014 10:52 PM

## 2014-08-06 NOTE — Progress Notes (Signed)
NP notified of pts Bicarb level of 39.6. Pt currently on Bicarb Drip at 50 ml/hr. Drip Dc'd.

## 2014-08-06 NOTE — Progress Notes (Signed)
Date: Aug 06, 2014 Chart reviewed for concurrent status and case management needs. Rhonda Davis, RN, BSN, CCM   [phone] 

## 2014-08-06 NOTE — Progress Notes (Addendum)
Shift event:  Notified by RN at approx 2030 regarding pt's persistent WOB and hypoxia. Pt now on NRB at 100% and sats 83-88% w/ RR- 28-38 . BP-157/70, P-123 and pt remains afebrile. Pt continues to be tachypneic w/ use of accessory muscles. Pt has remained tachypneic through-out the day on NRB at 100% but has managed to keep her 02 sats >90-92%. BBS w/ mild crackles R>L and expiratory wheezes. At bedside pt is awake and alert and oriented x 4 but admits she is "tired". Discussed w/ pt and daughter Mickel Baas) at bedside regarding need for ABG and possible BiPAP. Both verbalize understanding and are in agreement w/ BiPAP. ABG reveals respiratory alkalosis w/ moderate hypoxia (pH-7.51, pC02-46.9, p02-48.3 and bicarb of 37.5.) despite maximum 02 supplementation via NRB (all day). Repeat CXR this am revealed significant progression of asymmetric airspace disease. Which was felt to represent multi focal infectious infiltrates with pulmonary edema.  Assessment/Plan: 1. Acute respiratory failure w/ hypoxia: In setting of pt w/ worsening RML/RLL PNA and h/o asthma. Will place pt on BiPAP and repeat ABG after 3-4 hours. Will continue IV Zithromax, Rocephin and Vancomycin. Continue IV Solu-Medrol and scheduled/PRN Xopenex nebs. 2. Tachycardia: Associated w/ acute illness and tachypnea. Admission EKG w/o acute changes. Trop neg. Pt denies CP. Anticipate improvement w/ improved WOB.   If ABG not improved or pt continues to deteriorate will consult CCM for input. Will continue to monitor closely in SDU.  Jeryl Columbia, NP-C [facility] Pager [phone]

## 2014-08-07 DIAGNOSIS — J189 Pneumonia, unspecified organism: Secondary | ICD-10-CM | POA: Diagnosis present

## 2014-08-07 DIAGNOSIS — S27309A Unspecified injury of lung, unspecified, initial encounter: Secondary | ICD-10-CM | POA: Diagnosis present

## 2014-08-07 LAB — BASIC METABOLIC PANEL
Anion gap: 12 (ref 5–15)
BUN: 19 mg/dL (ref 6–20)
CALCIUM: 8.2 mg/dL — AB (ref 8.9–10.3)
CHLORIDE: 92 mmol/L — AB (ref 101–111)
CO2: 38 mmol/L — ABNORMAL HIGH (ref 22–32)
CREATININE: 0.46 mg/dL (ref 0.44–1.00)
GFR calc non Af Amer: >60 mL/min (ref >60)
Glucose, Bld: 140 mg/dL — ABNORMAL HIGH (ref 65–99)
Potassium: 3.2 mmol/L — ABNORMAL LOW (ref 3.5–5.1)
Sodium: 142 mmol/L (ref 135–145)

## 2014-08-07 LAB — GLUCOSE, CAPILLARY: Glucose-Capillary: 145 mg/dL — ABNORMAL HIGH (ref 65–99)

## 2014-08-07 LAB — BLOOD GAS, ARTERIAL
ACID-BASE EXCESS: 12 mmol/L — AB (ref 0.0–2.0)
BICARBONATE: 34.9 meq/L — AB (ref 20.0–24.0)
Drawn by: 331471
FIO2: 1 %
O2 Saturation: 96.8 %
Patient temperature: 98.6
TCO2: 30.5 mmol/L (ref 0–100)
pCO2 arterial: 36.6 mmHg (ref 35.0–45.0)
pH, Arterial: 7.586 — ABNORMAL HIGH (ref 7.350–7.450)
pO2, Arterial: 82.4 mmHg (ref 80.0–100.0)

## 2014-08-07 LAB — CBC
HEMATOCRIT: 36.9 % (ref 36.0–46.0)
Hemoglobin: 11.5 g/dL — ABNORMAL LOW (ref 12.0–15.0)
MCH: 29.1 pg (ref 26.0–34.0)
MCHC: 31.2 g/dL (ref 30.0–36.0)
MCV: 93.4 fL (ref 78.0–100.0)
PLATELETS: 367 10*3/uL (ref 150–400)
RBC: 3.95 MIL/uL (ref 3.87–5.11)
RDW: 14.8 % (ref 11.5–15.5)
WBC: 43.1 10*3/uL — ABNORMAL HIGH (ref 4.0–10.5)

## 2014-08-07 LAB — PROCALCITONIN
Procalcitonin: 3.3 ng/mL
Procalcitonin: 3.04 ng/mL

## 2014-08-07 MED ORDER — POTASSIUM CHLORIDE 10 MEQ/100ML IV SOLN
10.0000 meq | INTRAVENOUS | Status: AC
  Administered 2014-08-07 (×3): 10 meq via INTRAVENOUS
  Filled 2014-08-07 (×3): qty 100

## 2014-08-07 MED ORDER — ENOXAPARIN SODIUM 40 MG/0.4ML ~~LOC~~ SOLN
40.0000 mg | SUBCUTANEOUS | Status: DC
  Administered 2014-08-07 – 2014-08-13 (×7): 40 mg via SUBCUTANEOUS
  Filled 2014-08-07 (×7): qty 0.4

## 2014-08-07 MED ORDER — FUROSEMIDE 10 MG/ML IJ SOLN
40.0000 mg | Freq: Once | INTRAMUSCULAR | Status: AC
  Administered 2014-08-07: 40 mg via INTRAVENOUS
  Filled 2014-08-07: qty 4

## 2014-08-07 MED ORDER — BUDESONIDE 0.25 MG/2ML IN SUSP
0.5000 mg | Freq: Two times a day (BID) | RESPIRATORY_TRACT | Status: DC
  Administered 2014-08-07 – 2014-08-09 (×5): 0.5 mg via RESPIRATORY_TRACT
  Filled 2014-08-07 (×6): qty 4

## 2014-08-07 NOTE — Progress Notes (Signed)
PT Cancellation Note  Patient Details Name: [name] MRN: [id number] DOB: [birth year]   Cancelled Treatment:    Reason Eval/Treat Not Completed: Medical issues which prohibited therapy Pt on NRB and to switch back to BiPAP.  Not yet ready for PT at this time.   Felice Deem,KATHrine E 08/07/2014, 2:12 PM Carmelia Bake, PT, DPT 08/07/2014 Pager: [phone]

## 2014-08-07 NOTE — Progress Notes (Signed)
PROGRESS NOTE  [name] YYQ:[id number] DOB: Jun 08, 1940 DOA: 08/02/2014 PCP: Mayra Neer, MD  HPI/Recap of past 24 hours: Patient is a 74 year old female past history of asthma and hypertension admitted on 5/7 with several days of progressively worsening shortness of breath along with productive cough with yellowish sputum and subjective fevers and chills.  Patient started on IV fluids and antibiotics, her white blood cell count has progressively worsened.  reported history of diarrhea before coming in, however C. difficile cultures have remained negative. Overnight, more tachypneic and requiring additional oxygenation. Chest x-ray is no large amount of pleural effusion and progressive airspace disease.   Pulmonary consulted.  Started on IV Lasix and has diuresed some, however she still quite tachypneic, tachycardic requiring large amount of oxygenation. Patient today feels about the same. Still very short of breath  Assessment/Plan: Principal Problem:  Sepsis due to community-acquired pneumonia involving right middle and lower lobes causing Acute respiratory failure with hypoxia: Patient met criteria for sepsis based on tachycardia, tachypnea, hypotension, oxygen desaturation and elevated lactic acid level with pulmonary source. Has responded well to IV antibiotics, however leukocytosis remaining persistent and now respiratory function worsening. Overall respiratory status has not improved much despite Lasix. May end up getting intubated later today Active Problems:    Abdominal lymphadenopathy: Mesenteric adenopathy seen on abdominal CT. Lymphoma versus infectious consideration. Recommend CT scan follow-up in a month.   diarrhea: Unclear etiology. C. difficile and stool panel negative. Resolved.  Code Status: full code  Family Communication: daughter at the bedside  Disposition Plan: upgraded to ICU status   Consultants:  Pulmonary  Procedures:  None  Antibiotics:  IV  Rocephin 5/7-present  IV Zithromax 5/7-present  IV vancomycin 5/10-present   Objective: BP 154/90 mmHg  Pulse 107  Temp(Src) 98.4 F (36.9 C) (Axillary)  Resp 31  Ht $R'4\' 11"'pZ$  (1.499 m)  Wt 66.8 kg (147 lb 4.3 oz)  BMI 29.73 kg/m2  SpO2 92%  Intake/Output Summary (Last 24 hours) at 08/07/14 1330 Last data filed at 08/07/14 1200  Gross per 24 hour  Intake   1500 ml  Output   4645 ml  Net  -3145 ml   Filed Weights   08/04/14 0456 08/05/14 0500 08/06/14 0450  Weight: 66.4 kg (146 lb 6.2 oz) 64.8 kg (142 lb 13.7 oz) 66.8 kg (147 lb 4.3 oz)    Exam:   General:  alert and oriented 3, mild respiratory distress  Cardiovascular: regular rhythm, tachycardic  Respiratory: Decreased breath sounds bibasilar, prolonged expiratory phase bilaterally  Abdomen: soft, nontender, nondistended, positive bowel sounds  Musculoskeletal: no clubbing or cyanosis or edema   Data Reviewed: Basic Metabolic Panel:  Recent Labs Lab 08/02/14 1628  08/03/14 0350 08/04/14 0350 08/05/14 0346 08/05/14 1242 08/06/14 0358 08/07/14 0335  NA  --   --  135 140 144  --  147* 142  K  --   --  3.6 2.7* 2.8* 4.0 3.7 3.2*  CL  --   --  108 109 103  --  96* 92*  CO2  --   --  14* 24 35*  --  38* 38*  GLUCOSE  --   --  113* 158* 142*  --  159* 140*  BUN  --   --  33* 16 13  --  17 19  CREATININE  --   < > 1.11* 0.52 0.53  --  0.43* 0.46  CALCIUM  --   --  7.9* 8.6* 8.5*  --  8.4* 8.2*  MG 1.9  --   --  2.3  --   --   --   --   PHOS 2.6  --   --   --   --   --   --   --   < > = values in this interval not displayed. Liver Function Tests:  Recent Labs Lab 08/02/14 1131 08/05/14 0346  AST 59* 57*  ALT 31 44  ALKPHOS 88 116  BILITOT 1.9* 0.6  PROT 6.6 5.4*  ALBUMIN 3.2* 2.2*    Recent Labs Lab 08/02/14 1131  LIPASE 13*   No results for input(s): AMMONIA in the last 168 hours. CBC:  Recent Labs Lab 08/02/14 1131 08/02/14 2030 08/03/14 0350 08/04/14 0350 08/05/14 0017  08/06/14 0358 08/07/14 0335  WBC 24.1* 11.8* 16.9* 24.9* 41.3* 39.2* 43.1*  NEUTROABS 22.1* 10.6*  --   --   --   --   --   HGB 12.6 11.1* 12.2 10.6* 10.5* 10.7* 11.5*  HCT 37.7 33.3* 37.9 32.0* 32.1* 34.1* 36.9  MCV 91.1 91.2 92.4 90.4 90.9 93.2 93.4  PLT 292 215 229 264 271 299 367   Cardiac Enzymes:    Recent Labs Lab 08/03/14 1349  TROPONINI <0.03   BNP (last 3 results)  Recent Labs  08/05/14 0007  BNP 519.1*    ProBNP (last 3 results) No results for input(s): PROBNP in the last 8760 hours.  CBG:  Recent Labs Lab 08/07/14 1211  GLUCAP 145*    Recent Results (from the past 240 hour(s))  Blood culture (routine x 2)     Status: None (Preliminary result)   Collection Time: 08/02/14  3:32 PM  Result Value Ref Range Status   Specimen Description BLOOD RIGHT ANTECUBITAL  Final   Special Requests BOTTLES DRAWN AEROBIC AND ANAEROBIC 4 CC EACH  Final   Culture   Final           BLOOD CULTURE RECEIVED NO GROWTH TO DATE CULTURE WILL BE HELD FOR 5 DAYS BEFORE ISSUING A FINAL NEGATIVE REPORT Performed at [facility]    Report Status PENDING  Incomplete  Blood culture (routine x 2)     Status: None (Preliminary result)   Collection Time: 08/02/14  3:45 PM  Result Value Ref Range Status   Specimen Description BLOOD RIGHT ANTECUBITAL  Final   Special Requests BOTTLES DRAWN AEROBIC AND ANAEROBIC 5 CC EACH  Final   Culture   Final           BLOOD CULTURE RECEIVED NO GROWTH TO DATE CULTURE WILL BE HELD FOR 5 DAYS BEFORE ISSUING A FINAL NEGATIVE REPORT Performed at [facility]    Report Status PENDING  Incomplete  MRSA PCR Screening     Status: None   Collection Time: 08/02/14  7:00 PM  Result Value Ref Range Status   MRSA by PCR NEGATIVE NEGATIVE Final    Comment:        The GeneXpert MRSA Assay (FDA approved for NASAL specimens only), is one component of a comprehensive MRSA colonization surveillance program. It is not intended to diagnose  MRSA infection nor to guide or monitor treatment for MRSA infections.   Urine culture     Status: None   Collection Time: 08/02/14  8:38 PM  Result Value Ref Range Status   Specimen Description URINE, RANDOM  Final   Special Requests NONE  Final   Colony Count NO GROWTH Performed at [facility]  Final   Culture NO GROWTH Performed at [facility]   Final   Report Status 08/03/2014 FINAL  Final  Clostridium Difficile by PCR     Status: None   Collection Time: 08/03/14 10:13 AM  Result Value Ref Range Status   C difficile by pcr NEGATIVE NEGATIVE Final     Studies: Dg Chest Port 1 View  08/06/2014   CLINICAL DATA:  74 year old female with a history of shortness of breath  EXAM: PORTABLE CHEST - 1 VIEW  COMPARISON:  Chest x-ray 08/05/2014, 08/02/2014, 10/17/2012. Abdominal CT 08/02/2014  FINDINGS: Cardiomediastinal silhouette likely unchanged, partially obscured by overlying lung/ pleural disease.  Atherosclerotic calcifications.  Persisting bilateral, right greater than left airspace disease. No pneumothorax.  Likely right-sided pleural effusion.  Surgical changes of breast prosthesis.  No displaced fracture.  IMPRESSION: Persisting bilateral airspace disease, worst on the right.  Right-sided pleural effusion.  Atherosclerosis.  Signed,  Dulcy Fanny. Earleen Newport, DO  [facility]  [facility]   Electronically Signed   By: Corrie Mckusick D.O.   On: 08/06/2014 13:10    Scheduled Meds: . antiseptic oral rinse  7 mL Mouth Rinse q12n4p  . azelastine  1 spray Each Nare QHS  . azithromycin  500 mg Intravenous Q24H  . budesonide  0.5 mg Nebulization Q12H  . cefTRIAXone (ROCEPHIN)  IV  1 g Intravenous Q24H  . chlorhexidine  15 mL Mouth Rinse BID  . enoxaparin (LOVENOX) injection  40 mg Subcutaneous Q24H  . fluticasone  1-2 spray Each Nare QHS  . ipratropium-albuterol  3 mL Nebulization Q6H  . LORazepam  0.5 mg Intravenous Once  .  methylPREDNISolone (SOLU-MEDROL) injection  40 mg Intravenous Q12H  . raloxifene  60 mg Oral Q breakfast  . vancomycin  500 mg Intravenous Q12H    Continuous Infusions: . sodium chloride 10 mL/hr at 08/06/14 0447     Time spent: 25 minutes  [facility] Pager [phone]. If 7PM-7AM, please contact night-coverage at [email or web address], password [facility] 08/07/2014, 1:30 PM  LOS: 5 days

## 2014-08-07 NOTE — Progress Notes (Signed)
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 Day MD will be notified.

## 2014-08-07 NOTE — Care Management Note (Signed)
Case Management Note  Patient Details  Name: Valrie Jia MRN: [id number] Date of Birth: 1940/10/26  Subjective/Objective:                    Action/Plan:   Expected Discharge Date:           [id number]       Expected Discharge Plan:  Home/Self Care  In-House Referral:  NA  Discharge planning Services  CM Consult  Post Acute Care Choice:  NA Choice offered to:  NA  DME Arranged:    DME Agency:     HH Arranged:    HH Agency:     Status of Service:  In process, will continue to follow  Medicare Important Message Given:    Date Medicare IM Given:    Medicare IM give by:    Date Additional Medicare IM Given:    Additional Medicare Important Message give by:     If discussed at [place] of Stay Meetings, dates discussed:    Additional Comments:  Date:  Aug 07, 2014 U.R. performed for needs and level of care. Will continue to follow for Case Management needs.  Velva Harman, RN, BSN, CCM   [phone] Leeroy Cha, RN 08/07/2014, 9:52 AM

## 2014-08-07 NOTE — Progress Notes (Signed)
Pt off BIPAP at this time and on 100% NRB. Pt doing well rt will continue to monitor.

## 2014-08-07 NOTE — Progress Notes (Signed)
Name: [name] MRN: [id number] DOB: 1941-03-14    ADMISSION DATE:  08/02/2014 CONSULTATION DATE:  08/06/2014  REFERRING MD :  Dr. Maryland Pink  CHIEF COMPLAINT:  SOB  BRIEF PATIENT DESCRIPTION:  74 yo female with cough, wheeze, fever (Tm 101F), and respiratory failure from PNA.  Followed previously by Dr. Halford Chessman for Asthma.  SIGNIFICANT EVENTS  5/07 admitted for CAP 5/10 hypoxemia worsened BiPAP started 5/11 persistent hypoxemia, PCCM consulted  STUDIES:  12/05/12 PFT > FEV1 1.49 (82%), FEV1% 69, FEF 25-75% 1.09 (69%), TLC 4.38 (85%), DLCO 87%, borderline BD response 08/02/14 CT abd > Right middle and lower lobe pneumonia with small right pleural effusion. At least 3 nonspecific hepatic lesions. 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 Cholelithiasis 08/03/14 Echo > LVEF 50-55%, mild MR  SUBJECTIVE:  She feels breathing is improving.  She still has cough, but not as much sputum.  Denies sinus congestion or sore throat.  Burning in Lt arm from IV KCL.  VITAL SIGNS: Temp:  [97.5 F (36.4 C)-98.7 F (37.1 C)] 98.7 F (37.1 C) (05/12 0737) Pulse Rate:  [102-122] 107 (05/12 0600) Resp:  [18-38] 31 (05/12 0600) BP: (127-179)/(65-97) 159/75 mmHg (05/12 0600) SpO2:  [88 %-96 %] 91 % (05/12 0744) FiO2 (%):  [80 %-100 %] 100 % (05/12 0744)  PHYSICAL EXAMINATION: General: pleasant, mild increase WOB, speaks in full sentences Neuro: Alert, normal strength, moves all extremities HEENT: Pupils reactive Cardiovascular: regular, tachycardic Lungs: b/l faint crackles, no wheeze Abdomen:  Soft, non-tender, non-distended Musculoskeletal: No edema Skin: no rashes   CMP Latest Ref Rng 08/07/2014 08/06/2014 08/05/2014  Glucose 65 - 99 mg/dL 140(H) 159(H) -  BUN 6 - 20 mg/dL 19 17 -  Creatinine 0.44 - 1.00 mg/dL 0.46 0.43(L) -  Sodium 135 - 145 mmol/L 142 147(H) -  Potassium 3.5 - 5.1 mmol/L 3.2(L) 3.7 4.0  Chloride 101 - 111 mmol/L 92(L) 96(L) -  CO2 22 -  32 mmol/L 38(H) 38(H) -  Calcium 8.9 - 10.3 mg/dL 8.2(L) 8.4(L) -  Total Protein 6.5 - 8.1 g/dL - - -  Total Bilirubin 0.3 - 1.2 mg/dL - - -  Alkaline Phos 38 - 126 U/L - - -  AST 15 - 41 U/L - - -  ALT 14 - 54 U/L - - -    CBC Latest Ref Rng 08/07/2014 08/06/2014 08/05/2014  WBC 4.0 - 10.5 K/uL 43.1(H) 39.2(H) 41.3(H)  Hemoglobin 12.0 - 15.0 g/dL 11.5(L) 10.7(L) 10.5(L)  Hematocrit 36.0 - 46.0 % 36.9 34.1(L) 32.1(L)  Platelets 150 - 400 K/uL 367 299 271    Dg Chest Port 1 View  08/06/2014   CLINICAL DATA:  74 year old female with a history of shortness of breath  EXAM: PORTABLE CHEST - 1 VIEW  COMPARISON:  Chest x-ray 08/05/2014, 08/02/2014, 10/17/2012. Abdominal CT 08/02/2014  FINDINGS: Cardiomediastinal silhouette likely unchanged, partially obscured by overlying lung/ pleural disease.  Atherosclerotic calcifications.  Persisting bilateral, right greater than left airspace disease. No pneumothorax.  Likely right-sided pleural effusion.  Surgical changes of breast prosthesis.  No displaced fracture.  IMPRESSION: Persisting bilateral airspace disease, worst on the right.  Right-sided pleural effusion.  Atherosclerosis.  Signed,  Dulcy Fanny. Earleen Newport, DO  [facility]  [facility]   Electronically Signed   By: Corrie Mckusick D.O.   On: 08/06/2014 13:10     ASSESSMENT / PLAN:  PULMONARY A: Acute hypoxic respiratory failure 2nd to CAP. Hx of Asthma. Allergic rhinitis. P: Oxygen  to keep SpO2 > 92% PRN BiPAP >> monitor need for intubation F/u CXR Continue solumedrol 40 mg bid Pulmicort, duoneb Scheduled BDs Negative fluid balance as tolerated Continue astelin, flonase  CARDIAC A: Sepsis 2nd to PNA. P: Monitor hemodynamics  RENAL A: AKI >> resolved. Non anion gap metabolic acidosis >> resolved. Lactic acidosis >> resolved. Metabolic alkalosis 2nd to HCO3 administration >> d/c'ed 5/11. Hyperkalemia. P: Monitor renal fx, urine  outpt Replace electrolytes as needed  GASTROENTEROLOGY A: Nutrition. P: Regular diet  HEMATOLOGY A: Leukocytosis. Anemia of critical illness. Hx of breast cancer. P: F/u CBC Continue evista Lovenox for DVT prevention  INFECTION A: CAP. P: Day 6 of Abx, currently on vancomycin, rocephin, zithromax  Blood 5/07 >> negative C diff PCR 5/08 >> negative  ENDOCRINE A: Steroid induced hyperglycemia. P: Monitor blood sugar on BMET  NEUROLOGY A: No acute issues. P: Monitor mental status  CC time 35 minutes.  [name], MD [facility] Pulmonary/Critical Care 08/07/2014, 8:29 AM Pager:  [phone] After 3pm call: [phone]

## 2014-08-07 NOTE — Progress Notes (Signed)
Pt requesting to come off BIPAP per RN.  Pt placed on 100% NRB mask, Pt tolerating well at this time.  RT to monitor and assess as needed.

## 2014-08-07 NOTE — Progress Notes (Signed)
Rt placed pt on BIPAP due to low sats. 

## 2014-08-07 NOTE — Progress Notes (Signed)
Pt was took off BIPAP and placed on 100% NRB per pt request.

## 2014-08-07 NOTE — Progress Notes (Signed)
Comments:  Pt asking about plan for intubation. Reviewed MD note. Spoke w/ Dr Corinna Lines w/ [facility]. Will send CCM NP to assess pt and determine need for intubation vs continued plan of BiPAP. ABG reveals ph-7.58, pC02-39.8, p02-67.4 and bicarb of 37.9. Pt has remained somewhat tachypneic on partial NRB today RR- 28-32 w/ sats of 90-93%. Pt states she was originally agreeable to intubation but would much prefer BiPAP w/ medication to help her relax.  Assessment/Plan: 1. Acute respiratory failure w/ hypoxia: In setting of PNA in pt w/ h/o asthma. Will place pt on BiPAP for now and continue current plan pending evaluation by CCM.   Kendra Columbia, NP-C [facility] Pager [phone]

## 2014-08-07 NOTE — Progress Notes (Signed)
Rt placed pt back on BIPAP due to increase WOB.

## 2014-08-08 ENCOUNTER — Inpatient Hospital Stay (HOSPITAL_COMMUNITY): Payer: Commercial Managed Care - HMO

## 2014-08-08 DIAGNOSIS — J948 Other specified pleural conditions: Secondary | ICD-10-CM

## 2014-08-08 DIAGNOSIS — J90 Pleural effusion, not elsewhere classified: Secondary | ICD-10-CM | POA: Diagnosis present

## 2014-08-08 LAB — BASIC METABOLIC PANEL
ANION GAP: 10 (ref 5–15)
BUN: 24 mg/dL — AB (ref 6–20)
CALCIUM: 8.3 mg/dL — AB (ref 8.9–10.3)
CO2: 35 mmol/L — AB (ref 22–32)
Chloride: 97 mmol/L — ABNORMAL LOW (ref 101–111)
Creatinine, Ser: 0.48 mg/dL (ref 0.44–1.00)
GFR calc Af Amer: >60 mL/min (ref >60)
GFR calc non Af Amer: >60 mL/min (ref >60)
Glucose, Bld: 128 mg/dL — ABNORMAL HIGH (ref 65–99)
POTASSIUM: 3.8 mmol/L (ref 3.5–5.1)
SODIUM: 142 mmol/L (ref 135–145)

## 2014-08-08 LAB — BODY FLUID CELL COUNT WITH DIFFERENTIAL
LYMPHS FL: 4 %
Monocyte-Macrophage-Serous Fluid: 10 % — ABNORMAL LOW (ref 50–90)
Neutrophil Count, Fluid: 86 % — ABNORMAL HIGH (ref 0–25)
Total Nucleated Cell Count, Fluid: 16640 cu mm — ABNORMAL HIGH (ref 0–1000)

## 2014-08-08 LAB — BLOOD GAS, ARTERIAL
ACID-BASE EXCESS: 8.9 mmol/L — AB (ref 0.0–2.0)
BICARBONATE: 32 meq/L — AB (ref 20.0–24.0)
FIO2: 1 %
O2 Saturation: 94 %
PCO2 ART: 37.8 mmHg (ref 35.0–45.0)
PO2 ART: 66.2 mmHg — AB (ref 80.0–100.0)
Patient temperature: 98.6
TCO2: 28.3 mmol/L (ref 0–100)
pH, Arterial: 7.537 — ABNORMAL HIGH (ref 7.350–7.450)

## 2014-08-08 LAB — CBC
HEMATOCRIT: 34.4 % — AB (ref 36.0–46.0)
HEMOGLOBIN: 11.1 g/dL — AB (ref 12.0–15.0)
MCH: 30.1 pg (ref 26.0–34.0)
MCHC: 32.3 g/dL (ref 30.0–36.0)
MCV: 93.2 fL (ref 78.0–100.0)
PLATELETS: 356 10*3/uL (ref 150–400)
RBC: 3.69 MIL/uL — ABNORMAL LOW (ref 3.87–5.11)
RDW: 15 % (ref 11.5–15.5)
WBC: 40.2 10*3/uL — ABNORMAL HIGH (ref 4.0–10.5)

## 2014-08-08 LAB — PROCALCITONIN: PROCALCITONIN: 1.56 ng/mL

## 2014-08-08 LAB — MAGNESIUM: Magnesium: 2.2 mg/dL (ref 1.7–2.4)

## 2014-08-08 LAB — LACTATE DEHYDROGENASE, PLEURAL OR PERITONEAL FLUID: LD, Fluid: 6800 U/L — ABNORMAL HIGH (ref 3–23)

## 2014-08-08 LAB — PROTEIN, BODY FLUID: Total protein, fluid: <3 g/dL

## 2014-08-08 LAB — GLUCOSE, SEROUS FLUID: GLUCOSE FL: 59 mg/dL

## 2014-08-08 LAB — VANCOMYCIN, TROUGH: Vancomycin Tr: 7 ug/mL — ABNORMAL LOW (ref 10.0–20.0)

## 2014-08-08 MED ORDER — KETOROLAC TROMETHAMINE 30 MG/ML IJ SOLN
30.0000 mg | Freq: Once | INTRAMUSCULAR | Status: AC
  Administered 2014-08-08: 30 mg via INTRAVENOUS
  Filled 2014-08-08: qty 1

## 2014-08-08 MED ORDER — SODIUM CHLORIDE 0.9 % IJ SOLN
10.0000 mL | Freq: Two times a day (BID) | INTRAMUSCULAR | Status: DC
Start: 2014-08-08 — End: 2014-08-11
  Administered 2014-08-08 – 2014-08-10 (×3): 10 mL

## 2014-08-08 MED ORDER — VANCOMYCIN HCL IN DEXTROSE 1-5 GM/200ML-% IV SOLN
1000.0000 mg | Freq: Two times a day (BID) | INTRAVENOUS | Status: DC
  Administered 2014-08-08 – 2014-08-11 (×6): 1000 mg via INTRAVENOUS
  Filled 2014-08-08 (×6): qty 200

## 2014-08-08 MED ORDER — FUROSEMIDE 10 MG/ML IJ SOLN
40.0000 mg | Freq: Two times a day (BID) | INTRAMUSCULAR | Status: AC
Start: 2014-08-08 — End: 2014-08-08
  Administered 2014-08-08 (×2): 40 mg via INTRAVENOUS
  Filled 2014-08-08 (×2): qty 4

## 2014-08-08 MED ORDER — SODIUM CHLORIDE 0.9 % IJ SOLN
10.0000 mL | INTRAMUSCULAR | Status: DC | PRN
  Administered 2014-08-12: 10 mL
  Administered 2014-08-13: 20 mL
  Filled 2014-08-08 (×2): qty 40

## 2014-08-08 MED ORDER — POTASSIUM CHLORIDE 20 MEQ/15ML (10%) PO SOLN
40.0000 meq | Freq: Two times a day (BID) | ORAL | Status: AC
  Administered 2014-08-08 (×2): 40 meq via ORAL
  Filled 2014-08-08 (×2): qty 30

## 2014-08-08 NOTE — Progress Notes (Signed)
Name: [name] MRN: [id number] DOB: Nov 30, 1940    ADMISSION DATE:  08/02/2014 CONSULTATION DATE:  08/06/2014  REFERRING MD :  Dr. Maryland Pink  CHIEF COMPLAINT:  SOB  BRIEF PATIENT DESCRIPTION: 74 year old female admitted 5/7 with sepsis and hypoxic respiratory failure (PO2 54 mmHg on room air w/ sats 88%) both secondary to PNA. She was started on empiric ABX for CAP, but did not improve as anticipated. She had progressive hypoxia and SOB eventually requiring BiPAP 5/10. 5/11 hypoxia remained and PCCM was consulted.   SIGNIFICANT EVENTS  5/7 admitted for CAP 5/10 hypoxemia worsened: FIO2 29mmHg on 100% NRB.  BiPAP started 5/11 persistent hypoxemia, PCCM consulted  STUDIES:  -CXR 10/18/11>>Chronic scarring and volume loss in the right middle lobe and lingula similar to prior examinations -Exhaled nitric oxide 12/07/11>>8 ppb (normal) -Lt heart cath 01/24/12 >> Normal coronaries, LVEDP 8 mmHg, EF 55% -PFT 12/05/12 >> FEV1 1.49 (82%), FEV1% 69, FEF 25-75% 1.09 (69%), TLC 4.38 (85%), DLCO 87%, borderline BD response -5/7 CT abd > Right middle and lower lobe pneumonia with small right pleural effusion. At least 3 nonspecific hepatic lesions. 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 Cholelithiasis -5/8 Echo > LVEF 50-55%, mild MR  VITAL SIGNS: Temp:  [98 F (36.7 C)-100.9 F (38.3 C)] 98 F (36.7 C) (05/13 0800) Pulse Rate:  [95-114] 108 (05/13 0005) Resp:  [20-39] 20 (05/13 0900) BP: (106-179)/(51-94) 133/75 mmHg (05/13 0900) SpO2:  [91 %-97 %] 93 % (05/13 0900) FiO2 (%):  [65 %-100 %] 100 % (05/13 0821)  Intake/Output Summary (Last 24 hours) at 08/08/14 0918 Last data filed at 08/08/14 0900  Gross per 24 hour  Intake    950 ml  Output   2040 ml  Net  -1090 ml    PHYSICAL EXAMINATION: General:  Elderly female on 100% NRB Neuro:  Alert, oriented x 3. HEENT:  [place]/AT, no JVD, PERRL Cardiovascular:  Tachy, regular Lungs:  Coarse bilateral  breath sounds R>L no wheeze, decreased on right  Abdomen:  Soft, non-tender, non-distended Musculoskeletal:  No acute deformity or ROM limitaiton.  Skin:  Grossly intact   CMP Latest Ref Rng 08/08/2014 08/07/2014 08/06/2014  Glucose 65 - 99 mg/dL 128(H) 140(H) 159(H)  BUN 6 - 20 mg/dL 24(H) 19 17  Creatinine 0.44 - 1.00 mg/dL 0.48 0.46 0.43(L)  Sodium 135 - 145 mmol/L 142 142 147(H)  Potassium 3.5 - 5.1 mmol/L 3.8 3.2(L) 3.7  Chloride 101 - 111 mmol/L 97(L) 92(L) 96(L)  CO2 22 - 32 mmol/L 35(H) 38(H) 38(H)  Calcium 8.9 - 10.3 mg/dL 8.3(L) 8.2(L) 8.4(L)  Total Protein 6.5 - 8.1 g/dL - - -  Total Bilirubin 0.3 - 1.2 mg/dL - - -  Alkaline Phos 38 - 126 U/L - - -  AST 15 - 41 U/L - - -  ALT 14 - 54 U/L - - -    CBC Latest Ref Rng 08/08/2014 08/07/2014 08/06/2014  WBC 4.0 - 10.5 K/uL 40.2(H) 43.1(H) 39.2(H)  Hemoglobin 12.0 - 15.0 g/dL 11.1(L) 11.5(L) 10.7(L)  Hematocrit 36.0 - 46.0 % 34.4(L) 36.9 34.1(L)  Platelets 150 - 400 K/uL 356 367 299    ABG    Component Value Date/Time   PHART 7.537* 08/08/2014 0857   PCO2ART 37.8 08/08/2014 0857   PO2ART 66.2* 08/08/2014 0857   HCO3 32.0* 08/08/2014 0857   TCO2 28.3 08/08/2014 0857   ACIDBASEDEF 5.1* 08/02/2014 1700   O2SAT 94.0 08/08/2014 0857    ABG  Recent Labs  Lab 08/02/14 1628 08/02/14 1820 08/02/14 2030 08/02/14 2302  08/04/14 0350 08/05/14 0017 08/06/14 0358 08/06/14 2337 08/07/14 0335 08/08/14 0330  PROCALCITON 82.06  --   --   --   --   --   --   --  3.30 3.04 1.56  WBC  --   --  11.8*  --   < > 24.9* 41.3* 39.2*  --  43.1* 40.2*  LATICACIDVEN  --  3.65* 3.3* 2.3*  --  1.9  --   --   --   --   --   < > = values in this interval not displayed.   Dg Chest Port 1 View  08/08/2014   CLINICAL DATA:  Pneumonia.  EXAM: PORTABLE CHEST - 1 VIEW  COMPARISON:  08/06/2014.  FINDINGS: Mediastinum and hilar structures normal. Heart size stable. Pulmonary vascularity normal . Diffuse bilateral airspace disease noted. Stable  right pleural effusion. No pneumothorax. No acute bony abnormality.  IMPRESSION: 1. Diffuse bilateral airspace disease, no interim change . 2. Persistent small right pleural effusion.   Electronically Signed   By: Marcello Moores  Register   On: 08/08/2014 07:04   Dg Chest Port 1 View  08/06/2014   CLINICAL DATA:  74 year old female with a history of shortness of breath  EXAM: PORTABLE CHEST - 1 VIEW  COMPARISON:  Chest x-ray 08/05/2014, 08/02/2014, 10/17/2012. Abdominal CT 08/02/2014  FINDINGS: Cardiomediastinal silhouette likely unchanged, partially obscured by overlying lung/ pleural disease.  Atherosclerotic calcifications.  Persisting bilateral, right greater than left airspace disease. No pneumothorax.  Likely right-sided pleural effusion.  Surgical changes of breast prosthesis.  No displaced fracture.  IMPRESSION: Persisting bilateral airspace disease, worst on the right.  Right-sided pleural effusion.  Atherosclerosis.  Signed,  Dulcy Fanny. Earleen Newport, DO  [facility]  [facility]   Electronically Signed   By: Corrie Mckusick D.O.   On: 08/06/2014 13:10   R>L airspace disease w/ large right effusion   ASSESSMENT / PLAN:  Acute hypoxemic respiratory failure Diffuse pulmonary infiltrates of uncertain etiology, CAP, atypical PNA, ARDS BOOP large R effusion ? OSA - Supplemental O2 to maintain SpO2 > 90% - Trend SpO2, no more ABG for now - change to high flow O2 - Aggressive diuresis, keep negative balance - Continue systemic steroids - Continue empiric antibiotics - PCT alghorithm - therapeutic and diagnostic thoracentesis today  - Follow CXR   Asthma without acute exacerbation - Change home BDs to nebulized - Scheduled budesonide - PRN albuterol  Metabolic Alkalosis, diarrhea vs prolonged bicarb gtt c diff neg  - PRN imodium   08/08/2014 9:13 AM  Reviewed above, examined.  She still c/o wheezing and Rt sided pleuritic chest pain.  Still requiring  increased supplemental oxygen.  Heart rate regular and tachycardic, b/l expiratory wheeze with decreased BS Rt base, abd soft, no edema.  Labs, CXR from 5/13 reviewed.  She has progressive Rt pleural effusion >> will proceed with thoracentesis.  Procedure explained >> risks detailed as bleeding, pneumothorax, and infection.  She is agreeable to proceed.  Continue BDs, solumedrol, Abx.  F/u CXR, CBC, BMET.  Updated pt's husband at bedside.  [name], MD [facility] Pulmonary/Critical Care 08/08/2014, 10:50 AM Pager:  [phone] After 3pm call: [phone]

## 2014-08-08 NOTE — Progress Notes (Signed)
ANTIBIOTIC CONSULT NOTE - FOLLOW UP  Pharmacy Consult for Vancomycin Indication: pneumonia  Allergies  Allergen Reactions  . Fosamax [Alendronate Sodium] Nausea Only  . Sulfonamide Derivatives Rash    Patient Measurements: Height: 4\' 11"  (149.9 cm) Weight: 147 lb 4.3 oz (66.8 kg) IBW/kg (Calculated) : 43.2  Vital Signs: Temp: 98 F (36.7 C) (05/13 1200) Temp Source: Oral (05/13 1200) BP: 114/51 mmHg (05/13 1400) Intake/Output from previous day: 05/12 0701 - 05/13 0700 In: 1260 [P.O.:440; I.V.:220; IV Piggyback:600] Out: 2165 [Urine:2165] Intake/Output from this shift: Total I/O In: 310 [P.O.:240; I.V.:70] Out: 1000 [Urine:1000]  Labs:  Recent Labs  08/06/14 0358 08/07/14 0335 08/08/14 0330  WBC 39.2* 43.1* 40.2*  HGB 10.7* 11.5* 11.1*  PLT 299 367 356  CREATININE 0.43* 0.46 0.48   Estimated Creatinine Clearance: 52 mL/min (by C-G formula based on Cr of 0.48).  Recent Labs  08/08/14 1145  [place] 7*     Microbiology: Recent Results (from the past 720 hour(s))  Blood culture (routine x 2)     Status: None (Preliminary result)   Collection Time: 08/02/14  3:32 PM  Result Value Ref Range Status   Specimen Description BLOOD RIGHT ANTECUBITAL  Final   Special Requests BOTTLES DRAWN AEROBIC AND ANAEROBIC 4 CC EACH  Final   Culture   Final           BLOOD CULTURE RECEIVED NO GROWTH TO DATE CULTURE WILL BE HELD FOR 5 DAYS BEFORE ISSUING A FINAL NEGATIVE REPORT Performed at [facility]    Report Status PENDING  Incomplete  Blood culture (routine x 2)     Status: None (Preliminary result)   Collection Time: 08/02/14  3:45 PM  Result Value Ref Range Status   Specimen Description BLOOD RIGHT ANTECUBITAL  Final   Special Requests BOTTLES DRAWN AEROBIC AND ANAEROBIC 5 CC EACH  Final   Culture   Final           BLOOD CULTURE RECEIVED NO GROWTH TO DATE CULTURE WILL BE HELD FOR 5 DAYS BEFORE ISSUING A FINAL NEGATIVE REPORT Performed at [facility]    Report Status PENDING  Incomplete  MRSA PCR Screening     Status: None   Collection Time: 08/02/14  7:00 PM  Result Value Ref Range Status   MRSA by PCR NEGATIVE NEGATIVE Final    Comment:        The GeneXpert MRSA Assay (FDA approved for NASAL specimens only), is one component of a comprehensive MRSA colonization surveillance program. It is not intended to diagnose MRSA infection nor to guide or monitor treatment for MRSA infections.   Urine culture     Status: None   Collection Time: 08/02/14  8:38 PM  Result Value Ref Range Status   Specimen Description URINE, RANDOM  Final   Special Requests NONE  Final   Colony Count NO GROWTH Performed at [facility]   Final   Culture NO GROWTH Performed at [facility]   Final   Report Status 08/03/2014 FINAL  Final  Clostridium Difficile by PCR     Status: None   Collection Time: 08/03/14 10:13 AM  Result Value Ref Range Status   C difficile by pcr NEGATIVE NEGATIVE Final    Anti-infectives    Start     Dose/Rate Route Frequency Ordered Stop   08/05/14 1100  vancomycin (VANCOCIN) 500 mg in sodium chloride 0.9 % 100 mL IVPB     500 mg 100 mL/hr  over 60 Minutes Intravenous Every 12 hours 08/05/14 1018     08/03/14 1700  azithromycin (ZITHROMAX) 500 mg in dextrose 5 % 250 mL IVPB     500 mg 250 mL/hr over 60 Minutes Intravenous Every 24 hours 08/02/14 2001 08/09/14 1659   08/03/14 1600  cefTRIAXone (ROCEPHIN) 1 g in dextrose 5 % 50 mL IVPB     1 g 100 mL/hr over 30 Minutes Intravenous Every 24 hours 08/02/14 2001 08/09/14 1559   08/02/14 1500  cefTRIAXone (ROCEPHIN) 1 g in dextrose 5 % 50 mL IVPB     1 g 100 mL/hr over 30 Minutes Intravenous  Once 08/02/14 1445 08/02/14 1703   08/02/14 1445  azithromycin (ZITHROMAX) 500 mg in dextrose 5 % 250 mL IVPB     500 mg 250 mL/hr over 60 Minutes Intravenous  Once 08/02/14 1445 08/02/14 1803     Assessment 74 y.o F with hx asthma and HTN who presented to  the ED on 5/7 with c/o SOB.  Ceftriaxone and azithromycin were started on admission for suspected PNA.  She continued to have increased SOB; added vancomycin for broad coverage.  5/7 CTX/azithro>> 5/14 5/10 vanc >>  Temp: 100.9 yesterday, currently afebrile WBC: markedly elevated (solumedrol 40 q12) Renal: SCr stable; CrCl 52 CG CXR with stable airspace disease concerning for PNA, edema  5/7 bcx x2: ngtd 5/7 ucx: neg FINAL 5/8 cdiff PCR ( -)  Drug levels 5/13: Vancomycin trough 7 mcg/ml on 500 mg q12 (drawn 1 hr late)   Goal of Therapy:   Vancomycin trough level 15-20 mcg/ml   Eradication of infection  Appropriate antibiotic dosing for indication and renal function  Plan:  Day 7 of antibiotics (Day 4 vancomycin)  Increase vancomycin to 1g IV q12 hr.  SCr is stable, trough is likely underestimate as drawn 1 hr late  Follow clinical course, renal function, culture results as available  Follow for de-escalation of antibiotics and LOT   Reuel Boom, PharmD Pager: [phone] 08/08/2014, 3:31 PM

## 2014-08-08 NOTE — Evaluation (Signed)
Physical Therapy Evaluation Patient Details Name: [name] MRN: [id number] DOB: September 27, 1940 Today's Date: 08/08/2014   History of Present Illness  Pt admitted 5/7 with progressive SOB  Clinical Impression  Pt admitted with dx of resp fail and presenting with functional mobility limitations 2* generalized weakness, ltd endurance and SOB with exertion.  Pt hopes to progress to dc home with assist of spouse and could benefit from follow up HHPT dependent on acute stay progress.    Follow Up Recommendations Home health PT    Equipment Recommendations  None recommended by PT    Recommendations for Other Services OT consult     Precautions / Restrictions Precautions Precautions: Fall Restrictions Weight Bearing Restrictions: No      Mobility  Bed Mobility Overal bed mobility: Needs Assistance Bed Mobility: Supine to Sit;Sit to Supine     Supine to sit: Min assist;Mod assist Sit to supine: Min assist;Mod assist   General bed mobility comments: cues for roll and sequencing.  Pt utilizing rails to self assist  Transfers Overall transfer level: Needs assistance Equipment used: Rolling walker (2 wheeled) Transfers: Sit to/from Stand Sit to Stand: Min assist;+2 physical assistance;+2 safety/equipment         General transfer comment: cues for transition position and use of UEs to self assist  Ambulation/Gait Ambulation/Gait assistance: Min assist;+2 physical assistance;+2 safety/equipment Ambulation Distance (Feet): 3 Feet Assistive device: Rolling walker (2 wheeled) Gait Pattern/deviations: Step-to pattern;Decreased step length - right;Decreased step length - left;Shuffle     General Gait Details: Pt stepped in place at side of bed and side stepped up to top of bed with RW and assist x 2 for equipment and pt saftey  Stairs            Wheelchair Mobility    Modified Rankin (Stroke Patients Only)       Balance                                              Pertinent Vitals/Pain Pain Assessment: No/denies pain    Home Living Family/patient expects to be discharged to:: Private residence Living Arrangements: Spouse/significant other Available Help at Discharge: Family Type of Home: House Home Access: Stairs to enter Entrance Stairs-Rails: None Technical brewer of Steps: 2 Home Layout: Two level Home Equipment: None      Prior Function Level of Independence: Independent               Hand Dominance   Dominant Hand: Right    Extremity/Trunk Assessment   Upper Extremity Assessment: Generalized weakness           Lower Extremity Assessment: Generalized weakness      Cervical / Trunk Assessment: Normal  Communication   Communication: No difficulties  Cognition Arousal/Alertness: Awake/alert Behavior During Therapy: WFL for tasks assessed/performed Overall Cognitive Status: Within Functional Limits for tasks assessed                      General Comments      Exercises General Exercises - Lower Extremity Ankle Circles/Pumps: AROM;Both;15 reps;Supine Quad Sets: AROM;Both;10 reps;Supine      Assessment/Plan    PT Assessment Patient needs continued PT services  PT Diagnosis Difficulty walking   PT Problem List Decreased strength;Decreased range of motion;Decreased activity tolerance;Decreased balance;Decreased mobility;Decreased knowledge of use of DME  PT Treatment Interventions  DME instruction;Gait training;Stair training;Functional mobility training;Therapeutic activities;Therapeutic exercise;Patient/family education   PT Goals (Current goals can be found in the Care Plan section) Acute Rehab PT Goals Patient Stated Goal: Resume previous lifestyle with decreased pain PT Goal Formulation: With patient Time For Goal Achievement: 08/22/14 Potential to Achieve Goals: Good    Frequency Min 3X/week   Barriers to discharge        Co-evaluation               End  of Session Equipment Utilized During Treatment: Gait belt;Oxygen Activity Tolerance: Patient limited by fatigue Patient left: in bed;with call bell/phone within reach;with family/visitor present Nurse Communication: Mobility status         Time: 5364-6803 PT Time Calculation (min) (ACUTE ONLY): 23 min   Charges:   PT Evaluation $Initial PT Evaluation Tier I: 1 Procedure PT Treatments $Therapeutic Activity: 8-22 mins   PT G Codes:        Alyn Jurney 08-26-2014, 2:29 PM

## 2014-08-08 NOTE — Progress Notes (Signed)
Date: Aug 08, 2014 Chart reviewed for concurrent status and case management needs. Iv albuterol infusion, using bipap and nrb mask due to resp failure. High risk of intubation Velva Harman, RN, BSN, [place]   [phone]

## 2014-08-08 NOTE — Progress Notes (Signed)
Peripherally Inserted Central Catheter/Midline Placement  The IV Nurse has discussed with the patient and/or persons authorized to consent for the patient, the purpose of this procedure and the potential benefits and risks involved with this procedure.  The benefits include less needle sticks, lab draws from the catheter and patient may be discharged home with the catheter.  Risks include, but not limited to, infection, bleeding, blood clot (thrombus formation), and puncture of an artery; nerve damage and irregular heat beat.  Alternatives to this procedure were also discussed.  PICC/Midline Placement Documentation  PICC / Midline Double Lumen 29/02/11 PICC Right Basilic 37 cm 1 cm (Active)  Exposed Catheter (cm) 1 cm 08/08/2014  4:36 PM  Dressing Change Due 08/15/14 08/08/2014  4:36 PM       Zeno Hickel, Maricela Bo 08/08/2014, 4:37 PM

## 2014-08-08 NOTE — Progress Notes (Signed)
Pt was started on heated high flow [place] at this time per MD order.

## 2014-08-08 NOTE — Procedures (Signed)
Thoracentesis Procedure Note  Pre-operative Diagnosis: Pneumonia with Rt pleural effusion  Post-operative Diagnosis: same  Indications: Rt pleural effusion  Procedure Details  Consent: Informed consent was obtained. Risks of the procedure were discussed including: infection, bleeding, pain, pneumothorax.  Under sterile conditions the patient was positioned. Betadine solution and sterile drapes were utilized.  1% plain lidocaine was used to anesthetize the Rt 7th rib space. Fluid was obtained without any difficulties and minimal blood loss.  A dressing was applied to the wound and wound care instructions were provided.   Performed with ultrasound guidance.  Findings 1100 ml of cloudy pleural fluid was obtained.  Complications:  None; patient tolerated the procedure well.          Condition: stable  Plan Post-procedure CXR pending Will send fluid for glucose, protein, LDH, cell count, culture, and cytology  [name], MD [place] 08/08/2014, 11:10 AM Pager:  [phone] After 3pm call: [phone]

## 2014-08-08 NOTE — Progress Notes (Signed)
PROGRESS NOTE  [name] UJW:[id number] DOB: 01-08-1941 DOA: 08/02/2014 PCP: Mayra Neer, MD  HPI/Recap of past 24 hours: Patient is a 74 year old female past history of asthma and hypertension admitted on 5/7 with several days of progressively worsening shortness of breath along with productive cough with yellowish sputum and subjective fevers and chills.  Patient started on IV fluids and antibiotics, her white blood cell count has progressively worsened.  reported history of diarrhea before coming in, however C. difficile cultures have remained negative. Overnight, more tachypneic and requiring additional oxygenation. Chest x-ray is no large amount of pleural effusion and progressive airspace disease. Pulmonary consulted.  Started on IV Lasix and has diuresed some, however she still quite tachypneic, tachycardic requiring large amount of oxygenation.   She complains of shortness of breath and fatigue. With little improvement in her oxygenation and chest x-ray, pulmonary took patient for thoracentesis done 5/13. Almost 1 L of purulent fluid removed.      Assessment/Plan: Principal Problem:  Sepsis due to community-acquired pneumonia involving right middle and lower lobes (possible empyema ) causing Acute respiratory failure with hypoxia: Patient met criteria for sepsis based on tachycardia, tachypnea, hypotension, oxygen desaturation and elevated lactic acid level with pulmonary source. Has responded well to IV antibiotics, however leukocytosis remaining persistent and now respiratory function worsening. hopefully withThoracentesis and potential empyema drained, she will now continue to improve     Abdominal lymphadenopathy: Mesenteric adenopathy seen on abdominal CT. Lymphoma versus infectious consideration. Recommend CT scan follow-up in a month.   diarrhea: Unclear etiology. C. difficile and stool panel negative. Resolved.  Code Status: full code  Family communication: Husband at  the bedside  Disposition: upgraded to ICU status   Consultants:  Pulmonary  Procedures:  None  Antibiotics:  IV Rocephin 5/7-present  IV Zithromax 5/7-present  IV vancomycin 5/10-present   Objective: BP 109/50 mmHg  Pulse 108  Temp(Src) 98 F (36.7 C) (Oral)  Resp 29  Ht _0  (1.499 m)  Wt 66.8 kg (147 lb 4.3 oz)  BMI 29.73 kg/m2  SpO2 92%  Intake/Output Summary (Last 24 hours) at 08/08/14 1221 Last data filed at 08/08/14 1100  Gross per 24 hour  Intake    740 ml  Output   1540 ml  Net   -800 ml   Filed Weights   08/04/14 0456 08/05/14 0500 08/06/14 0450  Weight: 66.4 kg (146 lb 6.2 oz) 64.8 kg (142 lb 13.7 oz) 66.8 kg (147 lb 4.3 oz)    Exam:   General:  alert and oriented 3, continued respiratory distress   Cardiovascular: regular rhythm, tachycardic  Respiratory: Decreased breath sounds bibasilar, prolonged expiratory phase bilaterally  Abdomen: soft, nontender, nondistended, positive bowel sounds  Musculoskeletal: no clubbing or cyanosis or edema   Data Reviewed: Basic Metabolic Panel:  Recent Labs Lab 08/02/14 1628  08/04/14 0350 08/05/14 0346 08/05/14 1242 08/06/14 0358 08/07/14 0335 08/08/14 0330  NA  --   < > 140 144  --  147* 142 142  K  --   < > 2.7* 2.8* 4.0 3.7 3.2* 3.8  CL  --   < > 109 103  --  96* 92* 97*  CO2  --   < > 24 35*  --  38* 38* 35*  GLUCOSE  --   < > 158* 142*  --  159* 140* 128*  BUN  --   < > 16 13  --  17 19 24*  CREATININE  --   < >  0.52 0.53  --  0.43* 0.46 0.48  CALCIUM  --   < > 8.6* 8.5*  --  8.4* 8.2* 8.3*  MG 1.9  --  2.3  --   --   --   --  2.2  PHOS 2.6  --   --   --   --   --   --   --   < > = values in this interval not displayed. Liver Function Tests:  Recent Labs Lab 08/02/14 1131 08/05/14 0346  AST 59* 57*  ALT 31 44  ALKPHOS 88 116  BILITOT 1.9* 0.6  PROT 6.6 5.4*  ALBUMIN 3.2* 2.2*    Recent Labs Lab 08/02/14 1131  LIPASE 13*   No results for input(s): AMMONIA in the  last 168 hours. CBC:  Recent Labs Lab 08/02/14 1131 08/02/14 2030  08/04/14 0350 08/05/14 0017 08/06/14 0358 08/07/14 0335 08/08/14 0330  WBC 24.1* 11.8*  < > 24.9* 41.3* 39.2* 43.1* 40.2*  NEUTROABS 22.1* 10.6*  --   --   --   --   --   --   HGB 12.6 11.1*  < > 10.6* 10.5* 10.7* 11.5* 11.1*  HCT 37.7 33.3*  < > 32.0* 32.1* 34.1* 36.9 34.4*  MCV 91.1 91.2  < > 90.4 90.9 93.2 93.4 93.2  PLT 292 215  < > 264 271 299 367 356  < > = values in this interval not displayed. Cardiac Enzymes:    Recent Labs Lab 08/03/14 1349  TROPONINI <0.03   BNP (last 3 results)  Recent Labs  08/05/14 0007  BNP 519.1*    ProBNP (last 3 results) No results for input(s): PROBNP in the last 8760 hours.  CBG:  Recent Labs Lab 08/07/14 1211  GLUCAP 145*    Recent Results (from the past 240 hour(s))  Blood culture (routine x 2)     Status: None (Preliminary result)   Collection Time: 08/02/14  3:32 PM  Result Value Ref Range Status   Specimen Description BLOOD RIGHT ANTECUBITAL  Final   Special Requests BOTTLES DRAWN AEROBIC AND ANAEROBIC 4 CC EACH  Final   Culture   Final           BLOOD CULTURE RECEIVED NO GROWTH TO DATE CULTURE WILL BE HELD FOR 5 DAYS BEFORE ISSUING A FINAL NEGATIVE REPORT Performed at [facility]    Report Status PENDING  Incomplete  Blood culture (routine x 2)     Status: None (Preliminary result)   Collection Time: 08/02/14  3:45 PM  Result Value Ref Range Status   Specimen Description BLOOD RIGHT ANTECUBITAL  Final   Special Requests BOTTLES DRAWN AEROBIC AND ANAEROBIC 5 CC EACH  Final   Culture   Final           BLOOD CULTURE RECEIVED NO GROWTH TO DATE CULTURE WILL BE HELD FOR 5 DAYS BEFORE ISSUING A FINAL NEGATIVE REPORT Performed at [facility]    Report Status PENDING  Incomplete  MRSA PCR Screening     Status: None   Collection Time: 08/02/14  7:00 PM  Result Value Ref Range Status   MRSA by PCR NEGATIVE NEGATIVE Final    Comment:         The GeneXpert MRSA Assay (FDA approved for NASAL specimens only), is one component of a comprehensive MRSA colonization surveillance program. It is not intended to diagnose MRSA infection nor to guide or monitor treatment for MRSA infections.   Urine culture  Status: None   Collection Time: 08/02/14  8:38 PM  Result Value Ref Range Status   Specimen Description URINE, RANDOM  Final   Special Requests NONE  Final   Colony Count NO GROWTH Performed at [facility]   Final   Culture NO GROWTH Performed at [facility]   Final   Report Status 08/03/2014 FINAL  Final  Clostridium Difficile by PCR     Status: None   Collection Time: 08/03/14 10:13 AM  Result Value Ref Range Status   C difficile by pcr NEGATIVE NEGATIVE Final     Studies: No results found.  Scheduled Meds: . antiseptic oral rinse  7 mL Mouth Rinse q12n4p  . azelastine  1 spray Each Nare QHS  . azithromycin  500 mg Intravenous Q24H  . budesonide  0.5 mg Nebulization Q12H  . cefTRIAXone (ROCEPHIN)  IV  1 g Intravenous Q24H  . chlorhexidine  15 mL Mouth Rinse BID  . enoxaparin (LOVENOX) injection  40 mg Subcutaneous Q24H  . fluticasone  1-2 spray Each Nare QHS  . furosemide  40 mg Intravenous Q12H  . ipratropium-albuterol  3 mL Nebulization Q6H  . ketorolac  30 mg Intravenous Once  . LORazepam  0.5 mg Intravenous Once  . methylPREDNISolone (SOLU-MEDROL) injection  40 mg Intravenous Q12H  . potassium chloride  40 mEq Oral Q12H  . raloxifene  60 mg Oral Q breakfast  . vancomycin  500 mg Intravenous Q12H    Continuous Infusions: . sodium chloride 10 mL/hr at 08/08/14 1100     Time spent: 25 minutes  [place] [facility] Pager [phone]. If 7PM-7AM, please contact night-coverage at [email or web address], password [facility] 08/08/2014, 12:21 PM  LOS: 6 days

## 2014-08-08 NOTE — Progress Notes (Signed)
Rt did pt neb tx inline with BIPAP and then took pt off  per pt request and placed on 100% NRB.

## 2014-08-09 ENCOUNTER — Encounter (HOSPITAL_COMMUNITY): Payer: Self-pay | Admitting: Radiology

## 2014-08-09 ENCOUNTER — Inpatient Hospital Stay (HOSPITAL_COMMUNITY): Payer: Commercial Managed Care - HMO

## 2014-08-09 DIAGNOSIS — J869 Pyothorax without fistula: Secondary | ICD-10-CM

## 2014-08-09 DIAGNOSIS — S27309D Unspecified injury of lung, unspecified, subsequent encounter: Secondary | ICD-10-CM

## 2014-08-09 LAB — CBC
HCT: 32.8 % — ABNORMAL LOW (ref 36.0–46.0)
Hemoglobin: 10.4 g/dL — ABNORMAL LOW (ref 12.0–15.0)
MCH: 29.9 pg (ref 26.0–34.0)
MCHC: 31.7 g/dL (ref 30.0–36.0)
MCV: 94.3 fL (ref 78.0–100.0)
Platelets: 333 10*3/uL (ref 150–400)
RBC: 3.48 MIL/uL — AB (ref 3.87–5.11)
RDW: 15 % (ref 11.5–15.5)
WBC: 33.5 10*3/uL — ABNORMAL HIGH (ref 4.0–10.5)

## 2014-08-09 LAB — CULTURE, BLOOD (ROUTINE X 2)
CULTURE: NO GROWTH
Culture: NO GROWTH

## 2014-08-09 LAB — COMPREHENSIVE METABOLIC PANEL
ALT: 38 U/L (ref 14–54)
AST: 37 U/L (ref 15–41)
Albumin: 2.2 g/dL — ABNORMAL LOW (ref 3.5–5.0)
Alkaline Phosphatase: 87 U/L (ref 38–126)
Anion gap: 9 (ref 5–15)
BUN: 30 mg/dL — ABNORMAL HIGH (ref 6–20)
CALCIUM: 8.3 mg/dL — AB (ref 8.9–10.3)
CHLORIDE: 100 mmol/L — AB (ref 101–111)
CO2: 32 mmol/L (ref 22–32)
CREATININE: 0.52 mg/dL (ref 0.44–1.00)
GFR calc Af Amer: >60 mL/min (ref >60)
GFR calc non Af Amer: >60 mL/min (ref >60)
Glucose, Bld: 124 mg/dL — ABNORMAL HIGH (ref 65–99)
Potassium: 4.2 mmol/L (ref 3.5–5.1)
Sodium: 141 mmol/L (ref 135–145)
Total Bilirubin: 0.6 mg/dL (ref 0.3–1.2)
Total Protein: 6 g/dL — ABNORMAL LOW (ref 6.5–8.1)

## 2014-08-09 NOTE — Progress Notes (Signed)
PROGRESS NOTE  [name] EPP:[id number] DOB: 1940-12-16 DOA: 08/02/2014 PCP: Mayra Neer, MD  HPI/Recap of past 24 hours: Patient is a 74 year old female past history of asthma and hypertension admitted on 5/7 with several days of progressively worsening shortness of breath along with productive cough with yellowish sputum and subjective fevers and chills.  Patient started on IV fluids and antibiotics, her white blood cell count has progressively worsened.  reported history of diarrhea before coming in, however C. difficile cultures have remained negative. Overnight, more tachypneic and requiring additional oxygenation. Chest x-ray is no large amount of pleural effusion and progressive airspace disease. Pulmonary consulted.  Started on IV Lasix and has diuresed some, however she still quite tachypneic, tachycardic requiring large amount of oxygenation.   She complains of shortness of breath and fatigue. With little improvement in her oxygenation and chest x-ray, pulmonary took patient for thoracentesis done 5/13. Almost 1 L of purulent fluid removed.  Patient today much improved. Sitting upright in chair, still requiring high flow oxygen, although she feels much more comfortable. No complaints. No chest pain.      Assessment/Plan: Principal Problem:  Sepsis due to community-acquired pneumonia involving right middle and lower lobes (possible empyema ) causing Acute respiratory failure with hypoxia: Patient met criteria for sepsis based on tachycardia, tachypnea, hypotension, oxygen desaturation and elevated lactic acid level with pulmonary source. Has responded well to IV antibiotics, leukocytosis while still elevated, starting to trend down.  Respiratory function continues to improve.? Worsening on chest x-ray today. We'll try to get either CT or two-view chest x-ray in the next 24 hours. Have encouraged aggressive inspirometer use. Spoke with pulmonary. Likely transfer to floor in the next  24 hours    Abdominal lymphadenopathy: Mesenteric adenopathy seen on abdominal CT. Lymphoma versus infectious consideration. Recommend CT scan follow-up in a month.   diarrhea: Unclear etiology. C. difficile and stool panel negative. Resolved.  Code Status: full code  Family communication: Cousin at the bedside  Disposition: Likely transfer to floor in the next 24 hours   Consultants:  Pulmonary  Procedures:  None  Antibiotics:  IV Rocephin 5/7-present  IV Zithromax 5/7-present  IV vancomycin 5/10-present   Objective: BP 117/35 mmHg  Pulse 86  Temp(Src) 98.3 F (36.8 C) (Oral)  Resp 23  Ht '4\' 11"'  (1.499 m)  Wt 62 kg (136 lb 11 oz)  BMI 27.59 kg/m2  SpO2 93%  Intake/Output Summary (Last 24 hours) at 08/09/14 1221 Last data filed at 08/09/14 1200  Gross per 24 hour  Intake   1530 ml  Output   1380 ml  Net    150 ml   Filed Weights   08/05/14 0500 08/06/14 0450 08/09/14 0600  Weight: 64.8 kg (142 lb 13.7 oz) 66.8 kg (147 lb 4.3 oz) 62 kg (136 lb 11 oz)    Exam:   General:  alert and oriented 3, no acute distress  Cardiovascular: regular rhythm  Respiratory: Decreased breath sounds bibasilar, prolonged expiratory phase bilaterally, improved  Abdomen: soft, nontender, nondistended, positive bowel sounds  Musculoskeletal: no clubbing or cyanosis or edema   Data Reviewed: Basic Metabolic Panel:  Recent Labs Lab 08/02/14 1628  08/04/14 0350 08/05/14 0346 08/05/14 1242 08/06/14 0358 08/07/14 0335 08/08/14 0330 08/09/14 0430  NA  --   < > 140 144  --  147* 142 142 141  K  --   < > 2.7* 2.8* 4.0 3.7 3.2* 3.8 4.2  CL  --   < >  109 103  --  96* 92* 97* 100*  CO2  --   < > 24 35*  --  38* 38* 35* 32  GLUCOSE  --   < > 158* 142*  --  159* 140* 128* 124*  BUN  --   < > 16 13  --  17 19 24* 30*  CREATININE  --   < > 0.52 0.53  --  0.43* 0.46 0.48 0.52  CALCIUM  --   < > 8.6* 8.5*  --  8.4* 8.2* 8.3* 8.3*  MG 1.9  --  2.3  --   --   --   --  2.2   --   PHOS 2.6  --   --   --   --   --   --   --   --   < > = values in this interval not displayed. Liver Function Tests:  Recent Labs Lab 08/05/14 0346 08/09/14 0430  AST 57* 37  ALT 44 38  ALKPHOS 116 87  BILITOT 0.6 0.6  PROT 5.4* 6.0*  ALBUMIN 2.2* 2.2*   No results for input(s): LIPASE, AMYLASE in the last 168 hours. No results for input(s): AMMONIA in the last 168 hours. CBC:  Recent Labs Lab 08/02/14 2030  08/05/14 0017 08/06/14 0358 08/07/14 0335 08/08/14 0330 08/09/14 0430  WBC 11.8*  < > 41.3* 39.2* 43.1* 40.2* 33.5*  NEUTROABS 10.6*  --   --   --   --   --   --   HGB 11.1*  < > 10.5* 10.7* 11.5* 11.1* 10.4*  HCT 33.3*  < > 32.1* 34.1* 36.9 34.4* 32.8*  MCV 91.2  < > 90.9 93.2 93.4 93.2 94.3  PLT 215  < > 271 299 367 356 333  < > = values in this interval not displayed. Cardiac Enzymes:    Recent Labs Lab 08/03/14 1349  TROPONINI <0.03   BNP (last 3 results)  Recent Labs  08/05/14 0007  BNP 519.1*    ProBNP (last 3 results) No results for input(s): PROBNP in the last 8760 hours.  CBG:  Recent Labs Lab 08/07/14 1211  GLUCAP 145*    Recent Results (from the past 240 hour(s))  Blood culture (routine x 2)     Status: None   Collection Time: 08/02/14  3:32 PM  Result Value Ref Range Status   Specimen Description BLOOD RIGHT ANTECUBITAL  Final   Special Requests BOTTLES DRAWN AEROBIC AND ANAEROBIC 4 CC EACH  Final   Culture   Final    NO GROWTH 5 DAYS Performed at [facility]    Report Status 08/09/2014 FINAL  Final  Blood culture (routine x 2)     Status: None   Collection Time: 08/02/14  3:45 PM  Result Value Ref Range Status   Specimen Description BLOOD RIGHT ANTECUBITAL  Final   Special Requests BOTTLES DRAWN AEROBIC AND ANAEROBIC 5 CC EACH  Final   Culture   Final    NO GROWTH 5 DAYS Performed at [facility]    Report Status 08/09/2014 FINAL  Final  MRSA PCR Screening     Status: None   Collection Time:  08/02/14  7:00 PM  Result Value Ref Range Status   MRSA by PCR NEGATIVE NEGATIVE Final    Comment:        The GeneXpert MRSA Assay (FDA approved for NASAL specimens only), is one component of a comprehensive MRSA colonization surveillance program. It  is not intended to diagnose MRSA infection nor to guide or monitor treatment for MRSA infections.   Urine culture     Status: None   Collection Time: 08/02/14  8:38 PM  Result Value Ref Range Status   Specimen Description URINE, RANDOM  Final   Special Requests NONE  Final   Colony Count NO GROWTH Performed at [facility]   Final   Culture NO GROWTH Performed at [facility]   Final   Report Status 08/03/2014 FINAL  Final  Clostridium Difficile by PCR     Status: None   Collection Time: 08/03/14 10:13 AM  Result Value Ref Range Status   C difficile by pcr NEGATIVE NEGATIVE Final  Body fluid culture     Status: None (Preliminary result)   Collection Time: 08/08/14 11:12 AM  Result Value Ref Range Status   Specimen Description PLEURAL  Final   Special Requests NONE  Final   Gram Stain   Final    MODERATE WBC PRESENT, PREDOMINANTLY PMN NO ORGANISMS SEEN Performed at [facility]    Culture PENDING  Incomplete   Report Status PENDING  Incomplete     Studies: Dg Chest Port 1 View  08/08/2014   CLINICAL DATA:  Right-sided thoracentesis.  EXAM: PORTABLE CHEST - 1 VIEW  COMPARISON:  One-view chest 08/08/2014.  FINDINGS: The heart size normal. Bilateral interstitial and airspace disease is similar to the prior exam. There is a significant reduction in the right pleural effusion. No pneumothorax is present. The visualized soft tissues and bony thorax are otherwise unremarkable.  IMPRESSION: 1. Interval decrease in right pleural effusion without evidence for pneumothorax. 2. Stable bilateral interstitial airspace disease concerning for pneumonia and edema.   Electronically Signed   By: San Morelle M.D.    On: 08/08/2014 12:01   Dg Chest Port 1 View  08/08/2014   CLINICAL DATA:  Pneumonia.  EXAM: PORTABLE CHEST - 1 VIEW  COMPARISON:  08/06/2014.  FINDINGS: Mediastinum and hilar structures normal. Heart size stable. Pulmonary vascularity normal . Diffuse bilateral airspace disease noted. Stable right pleural effusion. No pneumothorax. No acute bony abnormality.  IMPRESSION: 1. Diffuse bilateral airspace disease, no interim change . 2. Persistent small right pleural effusion.   Electronically Signed   By: Marcello Moores  Register   On: 08/08/2014 07:04    Scheduled Meds: . antiseptic oral rinse  7 mL Mouth Rinse q12n4p  . azelastine  1 spray Each Nare QHS  . budesonide  0.5 mg Nebulization Q12H  . chlorhexidine  15 mL Mouth Rinse BID  . enoxaparin (LOVENOX) injection  40 mg Subcutaneous Q24H  . fluticasone  1-2 spray Each Nare QHS  . ipratropium-albuterol  3 mL Nebulization Q6H  . LORazepam  0.5 mg Intravenous Once  . methylPREDNISolone (SOLU-MEDROL) injection  40 mg Intravenous Q12H  . raloxifene  60 mg Oral Q breakfast  . sodium chloride  10-40 mL Intracatheter Q12H  . vancomycin  1,000 mg Intravenous Q12H    Continuous Infusions: . sodium chloride 10 mL/hr at 08/08/14 1100     Time spent: 25 minutes  [facility] Pager [phone]. If 7PM-7AM, please contact night-coverage at [email or web address], password [facility] 08/09/2014, 12:21 PM  LOS: 7 days

## 2014-08-09 NOTE — Progress Notes (Signed)
Physical Therapy Treatment Patient Details Name: [name] MRN: [id number] DOB: 10/21/1940 Today's Date: 2014-08-27    History of Present Illness Pt admitted 5/7 with progressive SOB    PT Comments    Improvement in activity tolerance this am but continues to fatigue easily.  Follow Up Recommendations  Home health PT     Equipment Recommendations  None recommended by PT    Recommendations for Other Services OT consult     Precautions / Restrictions Precautions Precautions: Fall Precaution Comments: Currrently on high flow O2 limiting distance Restrictions Weight Bearing Restrictions: No    Mobility  Bed Mobility Overal bed mobility: Needs Assistance Bed Mobility: Sit to Supine       Sit to supine: Min assist;Mod assist   General bed mobility comments: cues for roll and sequencing.   Transfers Overall transfer level: Needs assistance Equipment used: Rolling walker (2 wheeled) Transfers: Sit to/from Stand Sit to Stand: Min assist;+2 physical assistance;+2 safety/equipment         General transfer comment: cues for transition position and use of UEs to self assist  Ambulation/Gait Ambulation/Gait assistance: Min assist;+2 safety/equipment Ambulation Distance (Feet): 10 Feet (twice) Assistive device: Rolling walker (2 wheeled) Gait Pattern/deviations: Step-to pattern;Step-through pattern;Decreased step length - right;Decreased step length - left;Shuffle;Trunk flexed Gait velocity: decr   General Gait Details: cues for pace, posture and position from [employer]            Wheelchair Mobility    Modified Rankin (Stroke Patients Only)       Balance                                    Cognition Arousal/Alertness: Awake/alert Behavior During Therapy: WFL for tasks assessed/performed Overall Cognitive Status: Within Functional Limits for tasks assessed                      Exercises General Exercises - Lower  Extremity Ankle Circles/Pumps: AROM;Both;15 reps;Supine Quad Sets: AROM;Both;10 reps;Supine    General Comments        Pertinent Vitals/Pain Pain Assessment: No/denies pain    Home Living                      Prior Function            PT Goals (current goals can now be found in the care plan section) Acute Rehab PT Goals Patient Stated Goal: Resume previous lifestyle with decreased pain PT Goal Formulation: With patient Time For Goal Achievement: 08/22/14 Potential to Achieve Goals: Good Progress towards PT goals: Progressing toward goals    Frequency  Min 3X/week    PT Plan Current plan remains appropriate    Co-evaluation             End of Session Equipment Utilized During Treatment: Gait belt;Oxygen Activity Tolerance: Patient limited by fatigue Patient left: in bed;with call bell/phone within reach     Time: 1045-1103 PT Time Calculation (min) (ACUTE ONLY): 18 min  Charges:  $Gait Training: 8-22 mins                    G Codes:      [name] 08/27/14, 12:26 PM

## 2014-08-09 NOTE — Progress Notes (Addendum)
Name: Fredrick Dray MRN: [id number] DOB: 1940-09-13    ADMISSION DATE:  08/02/2014 CONSULTATION DATE:  08/06/2014  REFERRING MD :  Dr. Maryland Pink  CHIEF COMPLAINT:  SOB  BRIEF PATIENT DESCRIPTION: 74 year old female admitted 5/7 with sepsis and hypoxic respiratory failure (PO2 54 mmHg on room air w/ sats 88%) both secondary to PNA. She was started on empiric ABX for CAP, but did not improve as anticipated. She had progressive hypoxia and SOB eventually requiring BiPAP 5/10. 5/11 hypoxia remained and PCCM was consulted.   SIGNIFICANT EVENTS /studies -CXR 10/18/11>>Chronic scarring and volume loss in the right middle lobe and lingula similar to prior examinations -Exhaled nitric oxide 12/07/11>>8 ppb (normal) -Lt heart cath 01/24/12 >> Normal coronaries, LVEDP 8 mmHg, EF 55% -PFT 12/05/12 >> FEV1 1.49 (82%), FEV1% 69, FEF 25-75% 1.09 (69%), TLC 4.38 (85%), DLCO 87%, borderline BD response ..................................... 5/7 admitted for CAP 5/10 hypoxemia worsened: FIO2 60mmHg on 100% NRB.  BiPAP started 5/11 persistent hypoxemia, PCCM consulted -5/7 CT abd > Right middle and lower lobe pneumonia with small right pleural effusion. At least 3 nonspecific hepatic lesions. 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 Cholelithiasis -5/8 Echo > LVEF 50-55%, mild MR 08/08/14: 1100 mL [place] thora on right side    SUBJECTIVE/OVERNIGHT/INTERVAL HX 08/09/2014: RN report improved from face mask to 15L O2. Walked with PT. Looking better. THora rsult reviewed  Right now  frank exudate with LDH 6800 with 16K wbc and 86% PMN    VITAL SIGNS: Temp:  [98.2 F (36.8 C)-98.9 F (37.2 C)] 98.3 F (36.8 C) (05/14 0800) Pulse Rate:  [86-95] 86 (05/14 0241) Resp:  [19-35] 23 (05/14 1200) BP: (96-134)/(35-81) 117/35 mmHg (05/14 1200) SpO2:  [90 %-97 %] 93 % (05/14 1200) FiO2 (%):  [50 %-60 %] 50 % (05/14 1200) Weight:  [62 kg (136 lb 11 oz)] 62 kg (136 lb 11 oz) (05/14  0600)  Intake/Output Summary (Last 24 hours) at 08/09/14 1219 Last data filed at 08/09/14 1200  Gross per 24 hour  Intake   1530 ml  Output   1380 ml  Net    150 ml    PHYSICAL EXAMINATION: General:  Elderly female on 15% NRB Neuro:  Alert, oriented x 3. Moves all 4s. Speech normal HEENT:  [place]/AT, no JVD, PERRL , O2 on. Neck supple Cardiovascular:  Tachy, regular Lungs:  Coarse bilateral breath sounds Equal air entry almist. No increased work of breathing Abdomen:  Soft, non-tender, non-distended Musculoskeletal:  No acute deformity or ROM limitaiton.  Skin:  Grossly intact   PULMONARY  Recent Labs Lab 08/06/14 0245 08/06/14 0738 08/06/14 2212 08/07/14 0856 08/08/14 0857  PHART 7.510* 7.522* 7.585* 7.586* 7.537*  PCO2ART 49.8* 47.7* 39.8 36.6 37.8  PO2ART 73.8* 68.6* 67.4* 82.4 66.2*  HCO3 39.6* 38.9* 37.9* 34.9* 32.0*  TCO2 35.4 34.0 33.2 30.5 28.3  O2SAT 95.5 94.6 94.8 96.8 94.0   CBC  Recent Labs Lab 08/07/14 0335 08/08/14 0330 08/09/14 0430  HGB 11.5* 11.1* 10.4*  HCT 36.9 34.4* 32.8*  WBC 43.1* 40.2* 33.5*  PLT 367 356 333    COAGULATION  Recent Labs Lab 08/02/14 1628  INR 1.28    CARDIAC   Recent Labs Lab 08/03/14 1349  TROPONINI <0.03   No results for input(s): PROBNP in the last 168 hours.   CHEMISTRY  Recent Labs Lab 08/02/14 1628  08/04/14 0350 08/05/14 0346  08/06/14 0358 08/07/14 0335 08/08/14 0330 08/09/14 0430  NA  --   < >  140 144  --  147* 142 142 141  K  --   < > 2.7* 2.8*  < > 3.7 3.2* 3.8 4.2  CL  --   < > 109 103  --  96* 92* 97* 100*  CO2  --   < > 24 35*  --  38* 38* 35* 32  GLUCOSE  --   < > 158* 142*  --  159* 140* 128* 124*  BUN  --   < > 16 13  --  17 19 24* 30*  CREATININE  --   < > 0.52 0.53  --  0.43* 0.46 0.48 0.52  CALCIUM  --   < > 8.6* 8.5*  --  8.4* 8.2* 8.3* 8.3*  MG 1.9  --  2.3  --   --   --   --  2.2  --   PHOS 2.6  --   --   --   --   --   --   --   --   < > = values in this interval not  displayed. Estimated Creatinine Clearance: 50.1 mL/min (by C-G formula based on Cr of 0.52).   LIVER  Recent Labs Lab 08/02/14 1628 08/05/14 0346 08/09/14 0430  AST  --  57* 37  ALT  --  44 38  ALKPHOS  --  116 87  BILITOT  --  0.6 0.6  PROT  --  5.4* 6.0*  ALBUMIN  --  2.2* 2.2*  INR 1.28  --   --      INFECTIOUS  Recent Labs Lab 08/02/14 2030 08/02/14 2302 08/04/14 0350 08/06/14 2337 08/07/14 0335 08/08/14 0330  LATICACIDVEN 3.3* 2.3* 1.9  --   --   --   PROCALCITON  --   --   --  3.30 3.04 1.56     ENDOCRINE CBG (last 3)   Recent Labs  08/07/14 1211  GLUCAP 145*         IMAGING x48h  - one more images have been personally visualized and highlighted  Dg Chest Port 1 View  08/09/2014   CLINICAL DATA:  Pneumonia  EXAM: PORTABLE CHEST - 1 VIEW  COMPARISON:  Yesterday  FINDINGS: Right PICC placed. Tip is at the cavoatrial junction. Low lung volumes. Hazy bilateral airspace disease increased. Upper normal heart size. No pneumothorax.  IMPRESSION: Right PICC placed.  Tip is at the cavoatrial junction.  Worsening bilateral airspace disease.   Electronically Signed   By: Marybelle Killings M.D.   On: 08/09/2014 08:13   Dg Chest Port 1 View  08/08/2014   CLINICAL DATA:  Right-sided thoracentesis.  EXAM: PORTABLE CHEST - 1 VIEW  COMPARISON:  One-view chest 08/08/2014.  FINDINGS: The heart size normal. Bilateral interstitial and airspace disease is similar to the prior exam. There is a significant reduction in the right pleural effusion. No pneumothorax is present. The visualized soft tissues and bony thorax are otherwise unremarkable.  IMPRESSION: 1. Interval decrease in right pleural effusion without evidence for pneumothorax. 2. Stable bilateral interstitial airspace disease concerning for pneumonia and edema.   Electronically Signed   By: San Morelle M.D.   On: 08/08/2014 12:01   Dg Chest Port 1 View  08/08/2014   CLINICAL DATA:  Pneumonia.  EXAM: PORTABLE  CHEST - 1 VIEW  COMPARISON:  08/06/2014.  FINDINGS: Mediastinum and hilar structures normal. Heart size stable. Pulmonary vascularity normal . Diffuse bilateral airspace disease noted. Stable right pleural effusion. No pneumothorax. No  acute bony abnormality.  IMPRESSION: 1. Diffuse bilateral airspace disease, no interim change . 2. Persistent small right pleural effusion.   Electronically Signed   By: Marcello Moores  Register   On: 08/08/2014 07:04         ASSESSMENT / PLAN:   #Acute hypoxemic respiratory failure with ALI/CAP presentation on 08/02/2014 with   # diagnosis of Rt effusion/empyema 08/08/14 - following failure to improve (s/p 1.1L thora with exudative effusion with LDH 6800)  - currently improved from 100% face mask to 15L O2 which is not good and  CXR worse   - concern for residual empyema 08/09/2014    PLAN - get CT chest wo contrast  - depending on finding might need  Tube thoracostomy v VATS for residual empyema if present (patient counseled) - Supplemental O2 to maintain SpO2 > 90% ;  high flow O2 - Aggressive diuresis, keep negative balance - Continue systemic steroids - Continue empiric antibiotics -    Asthma without acute exacerbation -  BDs nebulized - Scheduled budesonide - PRN albuterol     .Dr. Brand Males, M.D., [facility].C.P Pulmonary and Critical Care Medicine Staff Physician [facility] Pager: [phone], If no answer or between  15:00h - 7:00h: call [phone]  08/09/2014 12:38 PM

## 2014-08-10 ENCOUNTER — Inpatient Hospital Stay (HOSPITAL_COMMUNITY): Payer: Commercial Managed Care - HMO

## 2014-08-10 DIAGNOSIS — R609 Edema, unspecified: Secondary | ICD-10-CM

## 2014-08-10 LAB — BASIC METABOLIC PANEL
Anion gap: 9 (ref 5–15)
BUN: 26 mg/dL — ABNORMAL HIGH (ref 6–20)
CALCIUM: 8.5 mg/dL — AB (ref 8.9–10.3)
CHLORIDE: 104 mmol/L (ref 101–111)
CO2: 27 mmol/L (ref 22–32)
CREATININE: 0.55 mg/dL (ref 0.44–1.00)
GFR calc Af Amer: >60 mL/min (ref >60)
GFR calc non Af Amer: >60 mL/min (ref >60)
Glucose, Bld: 150 mg/dL — ABNORMAL HIGH (ref 65–99)
Potassium: 4.3 mmol/L (ref 3.5–5.1)
SODIUM: 140 mmol/L (ref 135–145)

## 2014-08-10 LAB — CBC
HCT: 34.3 % — ABNORMAL LOW (ref 36.0–46.0)
Hemoglobin: 10.7 g/dL — ABNORMAL LOW (ref 12.0–15.0)
MCH: 29.2 pg (ref 26.0–34.0)
MCHC: 31.2 g/dL (ref 30.0–36.0)
MCV: 93.7 fL (ref 78.0–100.0)
Platelets: 387 10*3/uL (ref 150–400)
RBC: 3.66 MIL/uL — ABNORMAL LOW (ref 3.87–5.11)
RDW: 14.7 % (ref 11.5–15.5)
WBC: 35.1 10*3/uL — ABNORMAL HIGH (ref 4.0–10.5)

## 2014-08-10 LAB — MAGNESIUM: MAGNESIUM: 2.2 mg/dL (ref 1.7–2.4)

## 2014-08-10 LAB — PHOSPHORUS: PHOSPHORUS: 4 mg/dL (ref 2.5–4.6)

## 2014-08-10 LAB — VANCOMYCIN, TROUGH: VANCOMYCIN TR: 15 ug/mL (ref 10.0–20.0)

## 2014-08-10 MED ORDER — FUROSEMIDE 10 MG/ML IJ SOLN
20.0000 mg | Freq: Once | INTRAMUSCULAR | Status: AC
  Administered 2014-08-10: 20 mg via INTRAVENOUS
  Filled 2014-08-10: qty 2

## 2014-08-10 MED ORDER — PIPERACILLIN-TAZOBACTAM 3.375 G IVPB 30 MIN
3.3750 g | Freq: Once | INTRAVENOUS | Status: AC
  Administered 2014-08-10: 3.375 g via INTRAVENOUS
  Filled 2014-08-10: qty 50

## 2014-08-10 MED ORDER — BUDESONIDE 0.25 MG/2ML IN SUSP: 0.2500 mg | Freq: Two times a day (BID) | RESPIRATORY_TRACT | Status: DC

## 2014-08-10 MED ORDER — PIPERACILLIN-TAZOBACTAM 3.375 G IVPB
3.3750 g | Freq: Three times a day (TID) | INTRAVENOUS | Status: DC
  Administered 2014-08-10 – 2014-08-11 (×2): 3.375 g via INTRAVENOUS
  Filled 2014-08-10 (×2): qty 50

## 2014-08-10 MED ORDER — BUDESONIDE 0.5 MG/2ML IN SUSP
0.5000 mg | Freq: Two times a day (BID) | RESPIRATORY_TRACT | Status: DC
  Administered 2014-08-10 – 2014-08-13 (×7): 0.5 mg via RESPIRATORY_TRACT
  Filled 2014-08-10 (×6): qty 2

## 2014-08-10 NOTE — Progress Notes (Signed)
Pt given scheduled breathing treatment.  HR87, rr25, spo2 96% on 2lnc.  No respiratory distress/increased wob noted.  Bipap/hfnc not indicated at this time.  RT will continue to monitor and assess pt.

## 2014-08-10 NOTE — Progress Notes (Signed)
Physical Therapy Treatment Patient Details Name: [name] MRN: [id number] DOB: 02-Mar-1941 Today's Date: 27-Aug-2014    History of Present Illness Pt admitted 5/7 with progressive SOB    PT Comments    Pt progressing well with mobility this am - ambulated short distance in hall.  Follow Up Recommendations  Home health PT     Equipment Recommendations  None recommended by PT    Recommendations for Other Services OT consult     Precautions / Restrictions Precautions Precautions: Fall Restrictions Weight Bearing Restrictions: No    Mobility  Bed Mobility Overal bed mobility: Needs Assistance Bed Mobility: Supine to Sit;Sit to Supine     Supine to sit: Min assist Sit to supine: Min assist   General bed mobility comments: min assist to bring trunk upright and with LEs on return to bed  Transfers Overall transfer level: Needs assistance Equipment used: Rolling walker (2 wheeled) Transfers: Sit to/from Stand Sit to Stand: Min assist;+2 physical assistance;+2 safety/equipment         General transfer comment: cues for transition position and use of UEs to self assist  Ambulation/Gait Ambulation/Gait assistance: Min assist;+2 safety/equipment Ambulation Distance (Feet): 44 Feet Assistive device: Rolling walker (2 wheeled) Gait Pattern/deviations: Step-through pattern;Decreased step length - right;Decreased step length - left;Shuffle;Trunk flexed Gait velocity: decr   General Gait Details: cues for pace, posture and position from RW; multiple short standing rests to complete task   Stairs            Wheelchair Mobility    Modified Rankin (Stroke Patients Only)       Balance                                    Cognition Arousal/Alertness: Awake/alert Behavior During Therapy: WFL for tasks assessed/performed Overall Cognitive Status: Within Functional Limits for tasks assessed                      Exercises General  Exercises - Lower Extremity Ankle Circles/Pumps: AROM;Both;15 reps;Supine Quad Sets: AROM;Both;10 reps;Supine Gluteal Sets: AROM;Both;10 reps Heel Slides: Other (comment);Strengthening;AROM;10 reps;Both;Supine    General Comments        Pertinent Vitals/Pain Pain Assessment: No/denies pain    Home Living                      Prior Function            PT Goals (current goals can now be found in the care plan section) Acute Rehab PT Goals Patient Stated Goal: Resume previous lifestyle with decreased pain PT Goal Formulation: With patient Time For Goal Achievement: 08/22/14 Potential to Achieve Goals: Good Progress towards PT goals: Progressing toward goals    Frequency  Min 3X/week    PT Plan Current plan remains appropriate    Co-evaluation             End of Session Equipment Utilized During Treatment: Gait belt;Oxygen Activity Tolerance: Patient limited by fatigue;Patient tolerated treatment well Patient left: in bed;with call bell/phone within reach     Time: 1213-1239 PT Time Calculation (min) (ACUTE ONLY): 26 min  Charges:  $Gait Training: 8-22 mins $Therapeutic Exercise: 8-22 mins                    G Codes:      Silver Parkey 08-27-2014, 1:21 PM

## 2014-08-10 NOTE — Progress Notes (Signed)
ANTIBIOTIC CONSULT NOTE - FOLLOW UP  Pharmacy Consult for Vancomycin, Zosyn Indication: pneumonia  Allergies  Allergen Reactions  . Fosamax [Alendronate Sodium] Nausea Only  . Sulfonamide Derivatives Rash    Patient Measurements: Height: 4\' 11"  (149.9 cm) Weight: 132 lb 15 oz (60.3 kg) IBW/kg (Calculated) : 43.2  Vital Signs: Temp: 98.4 F (36.9 C) (05/15 1200) Temp Source: Axillary (05/15 1200) BP: 115/45 mmHg (05/15 1200) Pulse Rate: 89 (05/15 0114) Intake/Output from previous day: 05/14 0701 - 05/15 0700 In: 1060 [P.O.:440; I.V.:220; IV Piggyback:400] Out: 960 [Urine:960] Intake/Output from this shift: Total I/O In: 170 [P.O.:120; I.V.:50] Out: 900 [Urine:900]  Labs:  Recent Labs  08/08/14 0330 08/09/14 0430 08/10/14 0905  WBC 40.2* 33.5* 35.1*  HGB 11.1* 10.4* 10.7*  PLT 356 333 387  CREATININE 0.48 0.52 0.55   Estimated Creatinine Clearance: 49.4 mL/min (by C-G formula based on Cr of 0.55).  Recent Labs  08/08/14 1145  [place] 7*     Microbiology: Recent Results (from the past 720 hour(s))  Blood culture (routine x 2)     Status: None   Collection Time: 08/02/14  3:32 PM  Result Value Ref Range Status   Specimen Description BLOOD RIGHT ANTECUBITAL  Final   Special Requests BOTTLES DRAWN AEROBIC AND ANAEROBIC 4 CC EACH  Final   Culture   Final    NO GROWTH 5 DAYS Performed at [facility]    Report Status 08/09/2014 FINAL  Final  Blood culture (routine x 2)     Status: None   Collection Time: 08/02/14  3:45 PM  Result Value Ref Range Status   Specimen Description BLOOD RIGHT ANTECUBITAL  Final   Special Requests BOTTLES DRAWN AEROBIC AND ANAEROBIC 5 CC EACH  Final   Culture   Final    NO GROWTH 5 DAYS Performed at [facility]    Report Status 08/09/2014 FINAL  Final  MRSA PCR Screening     Status: None   Collection Time: 08/02/14  7:00 PM  Result Value Ref Range Status   MRSA by PCR NEGATIVE NEGATIVE Final   Comment:        The GeneXpert MRSA Assay (FDA approved for NASAL specimens only), is one component of a comprehensive MRSA colonization surveillance program. It is not intended to diagnose MRSA infection nor to guide or monitor treatment for MRSA infections.   Urine culture     Status: None   Collection Time: 08/02/14  8:38 PM  Result Value Ref Range Status   Specimen Description URINE, RANDOM  Final   Special Requests NONE  Final   Colony Count NO GROWTH Performed at [facility]   Final   Culture NO GROWTH Performed at [facility]   Final   Report Status 08/03/2014 FINAL  Final  Clostridium Difficile by PCR     Status: None   Collection Time: 08/03/14 10:13 AM  Result Value Ref Range Status   C difficile by pcr NEGATIVE NEGATIVE Final  Body fluid culture     Status: None (Preliminary result)   Collection Time: 08/08/14 11:12 AM  Result Value Ref Range Status   Specimen Description PLEURAL  Final   Special Requests NONE  Final   Gram Stain   Final    MODERATE WBC PRESENT, PREDOMINANTLY PMN NO ORGANISMS SEEN Performed at [facility]    Culture   Final    NO GROWTH 1 DAY Performed at [facility]    Report Status  PENDING  Incomplete    Anti-infectives    Start     Dose/Rate Route Frequency Ordered Stop   08/08/14 1700  vancomycin (VANCOCIN) IVPB 1000 mg/200 mL premix     1,000 mg 200 mL/hr over 60 Minutes Intravenous Every 12 hours 08/08/14 1618     08/05/14 1100  vancomycin (VANCOCIN) 500 mg in sodium chloride 0.9 % 100 mL IVPB  Status:  Discontinued     500 mg 100 mL/hr over 60 Minutes Intravenous Every 12 hours 08/05/14 1018 08/08/14 1618   08/03/14 1700  azithromycin (ZITHROMAX) 500 mg in dextrose 5 % 250 mL IVPB     500 mg 250 mL/hr over 60 Minutes Intravenous Every 24 hours 08/02/14 2001 08/08/14 1800   08/03/14 1600  cefTRIAXone (ROCEPHIN) 1 g in dextrose 5 % 50 mL IVPB     1 g 100 mL/hr over 30 Minutes Intravenous Every  24 hours 08/02/14 2001 08/08/14 1700   08/02/14 1500  cefTRIAXone (ROCEPHIN) 1 g in dextrose 5 % 50 mL IVPB     1 g 100 mL/hr over 30 Minutes Intravenous  Once 08/02/14 1445 08/02/14 1703   08/02/14 1445  azithromycin (ZITHROMAX) 500 mg in dextrose 5 % 250 mL IVPB     500 mg 250 mL/hr over 60 Minutes Intravenous  Once 08/02/14 1445 08/02/14 1803     Assessment 74 y.o F with hx asthma and HTN who presented to the ED on 5/7 with c/o SOB.  Ceftriaxone and azithromycin were started on admission for suspected PNA.  She continued to have increased SOB; added vancomycin for broad coverage.  With worsening SOB and CXR, thoracentesis done 5/13 which removed 1.1L purulent fluid, with some improvement.  However, worsened again on 5/15.  Completed 7 days of ceftriaxone/azithromycin, will add Zosyn to vancomycin for CAP with possible empyema.  5/7 CTX/azithro>> 5/14 5/10 vanc >> 5/15 Zosyn >>  Temp: currently afebrile WBC: markedly elevated (solumedrol 40 q12), stable in low 30s Renal: SCr stable; CrCl 50 CG CXR with stable airspace disease concerning for PNA, edema PCT trending down (5/13); 1.56 LA resolved 5/9  5/7 bcx x2: NGF 5/7 ucx: NGF 5/8 cdiff PCR ( -) 5/13 thoracentesis Cx: IP  Drug levels 5/13: Vancomycin trough 7 mcg/ml on 500 mg q12 (drawn 1 hr late)   Goal of Therapy:   Vancomycin trough level 15-20 mcg/ml   Eradication of infection  Appropriate antibiotic dosing for indication and renal function  Plan:  Day 9 of antibiotics (Day 6 vancomycin, day 1 Zosyn)  Continue vancomycin 1g IV q12 hr  Will check vancomycin trough before 5th dose with current regimen  Zosyn 3.375 g IV now over 30 min, then q8 hr by 4-hr infusion  Follow clinical course, renal function, culture results as available  Follow for de-escalation of antibiotics and LOT   Reuel Boom, PharmD Pager: [phone] 08/10/2014, 12:49 PM

## 2014-08-10 NOTE — Progress Notes (Signed)
PHARMACIST - PHYSICIAN COMMUNICATION\ CONCERNING:  Vancomycin  73 yoF on Vancomycin and Zosyn on antibiotics for PNA.  Please see consult note written earlier today by Reuel Boom, PharmD, for more details.    Vancomycin trough tonight is therapeutic at 15 mcg/ml on 1g q12h. (goal 15-20)   RECOMMENDATION: Continue vancomycin at current dose.  Follow renal function, cultures, clinical course.   Ralene Bathe, PharmD, BCPS 08/10/2014, 4:33 PM  Pager: [phone]  :

## 2014-08-10 NOTE — Progress Notes (Addendum)
PROGRESS NOTE  [name] ERX:[id number] DOB: Mar 03, 1941 DOA: 08/02/2014 PCP: Mayra Neer, MD  HPI/Recap of past 24 hours: Patient is a 74 year old female past history of asthma and hypertension admitted on 5/7 with several days of progressively worsening shortness of breath along with productive cough with yellowish sputum and subjective fevers and chills.  Patient started on IV fluids and antibiotics, her white blood cell count has progressively worsened.  reported history of diarrhea before coming in, however C. difficile cultures have remained negative. Overnight, more tachypneic and requiring additional oxygenation. Chest x-ray is no large amount of pleural effusion and progressive airspace disease. Pulmonary consulted.  Started on IV Lasix and has diuresed some, however she still quite tachypneic, tachycardic requiring large amount of oxygenation.   She complains of shortness of breath and fatigue. With little improvement in her oxygenation and chest x-ray, pulmonary took patient for thoracentesis done 5/13. Almost 1 L of purulent fluid removed.  Patient much improved by 5/14, although by morning of 5/15 she started requiring more oxygen. She does complain of some right sided chest pain below the right axilla with deep inspiration although she can chronically feel this. She continues to using inspirometer and is currently up to 750 mL     Assessment/Plan: Principal Problem:  Sepsis due to community-acquired pneumonia involving right middle and lower lobes (possible empyema ) causing Acute respiratory failure with hypoxia: Patient met criteria for sepsis based on tachycardia, tachypnea, hypotension, oxygen desaturation and elevated lactic acid level with pulmonary source. Has responded well to IV antibiotics, leukocytosis initially improving, now starting to trend back up. Agree with pulmonary that CT of chest done 5/14 mild in comparison to clinical presentation of hypoxia. Lower from a  Dopplers unrevealing. We'll add Zosyn    Abdominal lymphadenopathy: Mesenteric adenopathy seen on abdominal CT. Lymphoma versus infectious consideration. Recommend CT scan follow-up in a month.   diarrhea: Unclear etiology. C. difficile and stool panel negative. Resolved.  Code Status: full code  Family communication: Spoke with family yesterday  Disposition: Continue in ICU until breathing much improved   Consultants:  Pulmonary  Procedures:  None  Antibiotics:  IV Rocephin 5/7-5/14  IV Zithromax 5/7-5/14  IV vancomycin 5/10-present  IV Zosyn 5/15-present   Objective: BP 120/43 mmHg  Pulse 89  Temp(Src) 98 F (36.7 C) (Oral)  Resp 38  Ht '4\' 11"'  (1.499 m)  Wt 60.3 kg (132 lb 15 oz)  BMI 26.84 kg/m2  SpO2 91%  Intake/Output Summary (Last 24 hours) at 08/10/14 1124 Last data filed at 08/10/14 1100  Gross per 24 hour  Intake    980 ml  Output   1680 ml  Net   -700 ml   Filed Weights   08/06/14 0450 08/09/14 0600 08/10/14 0400  Weight: 66.8 kg (147 lb 4.3 oz) 62 kg (136 lb 11 oz) 60.3 kg (132 lb 15 oz)    Exam:   General:  alert and oriented 3, no acute distress  Cardiovascular: regular rhythm  Respiratory: Decreased breath sounds bibasilar, prolonged expiratory phase bilaterally, improved  Abdomen: soft, nontender, nondistended, positive bowel sounds  Musculoskeletal: no clubbing or cyanosis or edema   Data Reviewed: Basic Metabolic Panel:  Recent Labs Lab 08/04/14 0350  08/06/14 0358 08/07/14 0335 08/08/14 0330 08/09/14 0430 08/10/14 0440 08/10/14 0905  NA 140  < > 147* 142 142 141  --  140  K 2.7*  < > 3.7 3.2* 3.8 4.2  --  4.3  CL 109  < >  96* 92* 97* 100*  --  104  CO2 24  < > 38* 38* 35* 32  --  27  GLUCOSE 158*  < > 159* 140* 128* 124*  --  150*  BUN 16  < > 17 19 24* 30*  --  26*  CREATININE 0.52  < > 0.43* 0.46 0.48 0.52  --  0.55  CALCIUM 8.6*  < > 8.4* 8.2* 8.3* 8.3*  --  8.5*  MG 2.3  --   --   --  2.2  --  2.2  --     PHOS  --   --   --   --   --   --  4.0  --   < > = values in this interval not displayed. Liver Function Tests:  Recent Labs Lab 08/05/14 0346 08/09/14 0430  AST 57* 37  ALT 44 38  ALKPHOS 116 87  BILITOT 0.6 0.6  PROT 5.4* 6.0*  ALBUMIN 2.2* 2.2*   No results for input(s): LIPASE, AMYLASE in the last 168 hours. No results for input(s): AMMONIA in the last 168 hours. CBC:  Recent Labs Lab 08/06/14 0358 08/07/14 0335 08/08/14 0330 08/09/14 0430 08/10/14 0905  WBC 39.2* 43.1* 40.2* 33.5* 35.1*  HGB 10.7* 11.5* 11.1* 10.4* 10.7*  HCT 34.1* 36.9 34.4* 32.8* 34.3*  MCV 93.2 93.4 93.2 94.3 93.7  PLT 299 367 356 333 387   Cardiac Enzymes:    Recent Labs Lab 08/03/14 1349  TROPONINI <0.03   BNP (last 3 results)  Recent Labs  08/05/14 0007  BNP 519.1*    ProBNP (last 3 results) No results for input(s): PROBNP in the last 8760 hours.  CBG:  Recent Labs Lab 08/07/14 1211  GLUCAP 145*    Recent Results (from the past 240 hour(s))  Blood culture (routine x 2)     Status: None   Collection Time: 08/02/14  3:32 PM  Result Value Ref Range Status   Specimen Description BLOOD RIGHT ANTECUBITAL  Final   Special Requests BOTTLES DRAWN AEROBIC AND ANAEROBIC 4 CC EACH  Final   Culture   Final    NO GROWTH 5 DAYS Performed at [facility]    Report Status 08/09/2014 FINAL  Final  Blood culture (routine x 2)     Status: None   Collection Time: 08/02/14  3:45 PM  Result Value Ref Range Status   Specimen Description BLOOD RIGHT ANTECUBITAL  Final   Special Requests BOTTLES DRAWN AEROBIC AND ANAEROBIC 5 CC EACH  Final   Culture   Final    NO GROWTH 5 DAYS Performed at [facility]    Report Status 08/09/2014 FINAL  Final  MRSA PCR Screening     Status: None   Collection Time: 08/02/14  7:00 PM  Result Value Ref Range Status   MRSA by PCR NEGATIVE NEGATIVE Final    Comment:        The GeneXpert MRSA Assay (FDA approved for NASAL  specimens only), is one component of a comprehensive MRSA colonization surveillance program. It is not intended to diagnose MRSA infection nor to guide or monitor treatment for MRSA infections.   Urine culture     Status: None   Collection Time: 08/02/14  8:38 PM  Result Value Ref Range Status   Specimen Description URINE, RANDOM  Final   Special Requests NONE  Final   Colony Count NO GROWTH Performed at [facility]   Final   Culture NO  GROWTH Performed at [facility]   Final   Report Status 08/03/2014 FINAL  Final  Clostridium Difficile by PCR     Status: None   Collection Time: 08/03/14 10:13 AM  Result Value Ref Range Status   C difficile by pcr NEGATIVE NEGATIVE Final  Body fluid culture     Status: None (Preliminary result)   Collection Time: 08/08/14 11:12 AM  Result Value Ref Range Status   Specimen Description PLEURAL  Final   Special Requests NONE  Final   Gram Stain   Final    MODERATE WBC PRESENT, PREDOMINANTLY PMN NO ORGANISMS SEEN Performed at [facility]    Culture   Final    NO GROWTH 1 DAY Performed at [facility]    Report Status PENDING  Incomplete     Studies: Ct Chest Wo Contrast  08/09/2014   CLINICAL DATA:  Progressive worsening shortness of breath. Productive cough with subjective fever and chills. Elevated white blood cell count.  EXAM: CT CHEST WITHOUT CONTRAST  TECHNIQUE: Multidetector CT imaging of the chest was performed following the standard protocol without IV contrast.  COMPARISON:  Chest radiograph 08/09/2014 CT abdomen 08/02/2014, CT chest 05/02/2007  FINDINGS: Mediastinum/Nodes: No axillary supraclavicular lymphadenopathy. Right PICC line noted. No mediastinal or hilar lymphadenopathy. Trace pericardial effusion. Esophagus is normal.  Lungs/Pleura: There is some improvement in the dense right lower lobe consolidation seen on radiograph CT of 08/02/2014. There is persistent consolidation in the right  lower lobe anteriorly. There is a small focus of thin walled cavitation anterior right lower lobe measuring 3 cm (image 37, series 9) which is increased from 2 cm on prior. There is a small right pleural effusion. There is interlobular septal thickening in both lungs. There is patchy ground-glass opacities in both lungs.  Upper abdomen: Diffuse low-attenuation within liver. There is a calcified lesion left hepatic lobe unchanged in size from 2009. The peripheral calcification is noted.  Musculoskeletal: Bilateral breast prosthetic. No aggressive osseous lesion.  IMPRESSION: 1. Improvement in dense consolidation in the right lower lobe compared to CT of 08/02/2014 is consistent with improving pneumonia. 2. There is a new thin-walled cavitary lesion in the anterior right lower lobe adjacent to the consolidation which is increased in size from prior and is likely post infectious. No clear evidence of superinfection of this thin-walled cavitary lesion. 3. Scattered ground-glass opacities and interlobular septal is increased from 08/01/2004 and likely represents mild pulmonary edema and interstitial edema. Multifocal infection is felt less likely. 4. Hepatic steatosis.   Electronically Signed   By: Suzy Bouchard M.D.   On: 08/09/2014 14:31   Dg Chest Port 1 View  08/09/2014   CLINICAL DATA:  Pneumonia  EXAM: PORTABLE CHEST - 1 VIEW  COMPARISON:  Yesterday  FINDINGS: Right PICC placed. Tip is at the cavoatrial junction. Low lung volumes. Hazy bilateral airspace disease increased. Upper normal heart size. No pneumothorax.  IMPRESSION: Right PICC placed.  Tip is at the cavoatrial junction.  Worsening bilateral airspace disease.   Electronically Signed   By: Marybelle Killings M.D.   On: 08/09/2014 08:13    Scheduled Meds: . antiseptic oral rinse  7 mL Mouth Rinse q12n4p  . azelastine  1 spray Each Nare QHS  . budesonide (PULMICORT) nebulizer solution  0.5 mg Nebulization BID  . enoxaparin (LOVENOX) injection  40 mg  Subcutaneous Q24H  . fluticasone  1-2 spray Each Nare QHS  . ipratropium-albuterol  3 mL Nebulization Q6H  .  LORazepam  0.5 mg Intravenous Once  . methylPREDNISolone (SOLU-MEDROL) injection  40 mg Intravenous Q12H  . raloxifene  60 mg Oral Q breakfast  . sodium chloride  10-40 mL Intracatheter Q12H  . vancomycin  1,000 mg Intravenous Q12H    Continuous Infusions: . sodium chloride 10 mL/hr at 08/08/14 1100     Time spent: 25 minutes  [facility] Pager [phone]. If 7PM-7AM, please contact night-coverage at [email or web address], password [facility] 08/10/2014, 11:24 AM  LOS: 8 days

## 2014-08-10 NOTE — Progress Notes (Signed)
Update  D/w RN  - duplex prelim negative for DVT - RN Says titrating o2 down  Plan  - will hold off cta for pe  - if gets worse then get cta    Dr. Brand Males, M.D., [facility].C.P Pulmonary and Critical Care Medicine Staff Physician [facility] Pager: [phone], If no answer or between  15:00h - 7:00h: call [phone]  08/10/2014 12:22 PM

## 2014-08-10 NOTE — Progress Notes (Signed)
Name: [name] MRN: [id number] DOB: 1941-03-21    ADMISSION DATE:  08/02/2014 CONSULTATION DATE:  08/06/2014  REFERRING MD :  Dr. Maryland Pink  CHIEF COMPLAINT:  SOB  BRIEF PATIENT DESCRIPTION: 74 year old female admitted 5/7 with sepsis and hypoxic respiratory failure (PO2 54 mmHg on room air w/ sats 88%) both secondary to PNA. She was started on empiric ABX for CAP, but did not improve as anticipated. She had progressive hypoxia and SOB eventually requiring BiPAP 5/10. 5/11 hypoxia remained and PCCM was consulted.   SIGNIFICANT EVENTS /studies -CXR 10/18/11>>Chronic scarring and volume loss in the right middle lobe and lingula similar to prior examinations -Exhaled nitric oxide 12/07/11>>8 ppb (normal) -Lt heart cath 01/24/12 >> Normal coronaries, LVEDP 8 mmHg, EF 55% -PFT 12/05/12 >> FEV1 1.49 (82%), FEV1% 69, FEF 25-75% 1.09 (69%), TLC 4.38 (85%), DLCO 87%, borderline BD response ..................................... 5/7 admitted for CAP 5/10 hypoxemia worsened: FIO2 57mmHg on 100% NRB.  BiPAP started 5/11 persistent hypoxemia, PCCM consulted -5/7 CT abd > Right middle and lower lobe pneumonia with small right pleural effusion. At least 3 nonspecific hepatic lesions. 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 Cholelithiasis -5/8 Echo > LVEF 50-55%, mild MR 08/08/14: 1100 mL [place] thora on right side 08/09/2014: RN report improved from face mask to 15L O2. Walked with PT. Looking better. THora rsult reviewed  Right now  frank exudate with LDH 6800 with 16K wbc and 86% PMN    SUBJECTIVE/OVERNIGHT/INTERVAL HX 08/10/2014 : RN gave hx: reports that patient weaned down to 30% fio2 end of day shift yeaterday but this morning up at 50% face mask and now down to 40% on high flow [place]. Reports of worsening cough. CT  Yesterday does not have residual pleural effusion but for a small sliver/ I/O shows negative balance cumulative 2.6L since 08/04/14. Patient admits [place] 9h  [place] but  with multiple stops ; 4d prior to admission    VITAL SIGNS: Temp:  [97.7 F (36.5 C)-98.7 F (37.1 C)] 98 F (36.7 C) (05/15 0750) Pulse Rate:  [87-89] 89 (05/15 0114) Resp:  [19-33] 33 (05/15 0900) BP: (109-137)/(35-66) 111/47 mmHg (05/15 0900) SpO2:  [88 %-98 %] 95 % (05/15 0900) FiO2 (%):  [30 %-50 %] 40 % (05/15 0856) Weight:  [60.3 kg (132 lb 15 oz)] 60.3 kg (132 lb 15 oz) (05/15 0400)  Intake/Output Summary (Last 24 hours) at 08/10/14 1033 Last data filed at 08/10/14 1000  Gross per 24 hour  Intake    980 ml  Output    880 ml  Net    100 ml    PHYSICAL EXAMINATION: General:  Elderly female on 40% high flow o2 Neuro:  Alert, oriented x 3. Moves all 4s. Speech normal HEENT:  [place]/AT, no JVD, PERRL , O2 on. Neck supple Cardiovascular:  Tachy, regular Lungs:  Coarse bilateral breath sounds Equal air entry almist. No increased work of breathing Abdomen:  Soft, non-tender, non-distended Musculoskeletal:  No acute deformity or ROM limitaiton.  Skin:  Grossly intact   PULMONARY  Recent Labs Lab 08/06/14 0245 08/06/14 0738 08/06/14 2212 08/07/14 0856 08/08/14 0857  PHART 7.510* 7.522* 7.585* 7.586* 7.537*  PCO2ART 49.8* 47.7* 39.8 36.6 37.8  PO2ART 73.8* 68.6* 67.4* 82.4 66.2*  HCO3 39.6* 38.9* 37.9* 34.9* 32.0*  TCO2 35.4 34.0 33.2 30.5 28.3  O2SAT 95.5 94.6 94.8 96.8 94.0   CBC  Recent Labs Lab 08/08/14 0330 08/09/14 0430 08/10/14 0905  HGB 11.1* 10.4* 10.7*  HCT 34.4* 32.8* 34.3*  WBC 40.2* 33.5* 35.1*  PLT 356 333 387    COAGULATION No results for input(s): INR in the last 168 hours.  CARDIAC    Recent Labs Lab 08/03/14 1349  TROPONINI <0.03   No results for input(s): PROBNP in the last 168 hours.   CHEMISTRY  Recent Labs Lab 08/04/14 0350  08/06/14 0358 08/07/14 0335 08/08/14 0330 08/09/14 0430 08/10/14 0440 08/10/14 0905  NA 140  < > 147* 142 142 141  --  140  K 2.7*  < > 3.7 3.2* 3.8 4.2  --   4.3  CL 109  < > 96* 92* 97* 100*  --  104  CO2 24  < > 38* 38* 35* 32  --  27  GLUCOSE 158*  < > 159* 140* 128* 124*  --  150*  BUN 16  < > 17 19 24* 30*  --  26*  CREATININE 0.52  < > 0.43* 0.46 0.48 0.52  --  0.55  CALCIUM 8.6*  < > 8.4* 8.2* 8.3* 8.3*  --  8.5*  MG 2.3  --   --   --  2.2  --  2.2  --   PHOS  --   --   --   --   --   --  4.0  --   < > = values in this interval not displayed. Estimated Creatinine Clearance: 49.4 mL/min (by C-G formula based on Cr of 0.55).   LIVER  Recent Labs Lab 08/05/14 0346 08/09/14 0430  AST 57* 37  ALT 44 38  ALKPHOS 116 87  BILITOT 0.6 0.6  PROT 5.4* 6.0*  ALBUMIN 2.2* 2.2*     INFECTIOUS  Recent Labs Lab 08/04/14 0350 08/06/14 2337 08/07/14 0335 08/08/14 0330  LATICACIDVEN 1.9  --   --   --   PROCALCITON  --  3.30 3.04 1.56     ENDOCRINE CBG (last 3)   Recent Labs  08/07/14 1211  GLUCAP 145*         IMAGING x48h  - one more images have been personally visualized and highlighted  Ct Chest Wo Contrast  08/09/2014   CLINICAL DATA:  Progressive worsening shortness of breath. Productive cough with subjective fever and chills. Elevated white blood cell count.  EXAM: CT CHEST WITHOUT CONTRAST  TECHNIQUE: Multidetector CT imaging of the chest was performed following the standard protocol without IV contrast.  COMPARISON:  Chest radiograph 08/09/2014 CT abdomen 08/02/2014, CT chest 05/02/2007  FINDINGS: Mediastinum/Nodes: No axillary supraclavicular lymphadenopathy. Right PICC line noted. No mediastinal or hilar lymphadenopathy. Trace pericardial effusion. Esophagus is normal.  Lungs/Pleura: There is some improvement in the dense right lower lobe consolidation seen on radiograph CT of 08/02/2014. There is persistent consolidation in the right lower lobe anteriorly. There is a small focus of thin walled cavitation anterior right lower lobe measuring 3 cm (image 37, series 9) which is increased from 2 cm on prior. There is a  small right pleural effusion. There is interlobular septal thickening in both lungs. There is patchy ground-glass opacities in both lungs.  Upper abdomen: Diffuse low-attenuation within liver. There is a calcified lesion left hepatic lobe unchanged in size from 2009. The peripheral calcification is noted.  Musculoskeletal: Bilateral breast prosthetic. No aggressive osseous lesion.  IMPRESSION: 1. Improvement in dense consolidation in the right lower lobe compared to CT of 08/02/2014 is consistent with improving pneumonia. 2. There is a new thin-walled  cavitary lesion in the anterior right lower lobe adjacent to the consolidation which is increased in size from prior and is likely post infectious. No clear evidence of superinfection of this thin-walled cavitary lesion. 3. Scattered ground-glass opacities and interlobular septal is increased from 08/01/2004 and likely represents mild pulmonary edema and interstitial edema. Multifocal infection is felt less likely. 4. Hepatic steatosis.   Electronically Signed   By: Suzy Bouchard M.D.   On: 08/09/2014 14:31   Dg Chest Port 1 View  08/09/2014   CLINICAL DATA:  Pneumonia  EXAM: PORTABLE CHEST - 1 VIEW  COMPARISON:  Yesterday  FINDINGS: Right PICC placed. Tip is at the cavoatrial junction. Low lung volumes. Hazy bilateral airspace disease increased. Upper normal heart size. No pneumothorax.  IMPRESSION: Right PICC placed.  Tip is at the cavoatrial junction.  Worsening bilateral airspace disease.   Electronically Signed   By: Marybelle Killings M.D.   On: 08/09/2014 08:13   Dg Chest Port 1 View  08/08/2014   CLINICAL DATA:  Right-sided thoracentesis.  EXAM: PORTABLE CHEST - 1 VIEW  COMPARISON:  One-view chest 08/08/2014.  FINDINGS: The heart size normal. Bilateral interstitial and airspace disease is similar to the prior exam. There is a significant reduction in the right pleural effusion. No pneumothorax is present. The visualized soft tissues and bony thorax are  otherwise unremarkable.  IMPRESSION: 1. Interval decrease in right pleural effusion without evidence for pneumothorax. 2. Stable bilateral interstitial airspace disease concerning for pneumonia and edema.   Electronically Signed   By: San Morelle M.D.   On: 08/08/2014 12:01         ASSESSMENT / PLAN:   #Acute hypoxemic respiratory failure with ALI/CAP presentation on 08/02/2014 with   # diagnosis of Rt effusion/empyema 08/08/14 - following failure to improve (s/p 1.1L thora with exudative effusion with LDH 6800)  - CT 08/09/14 with neearly resolved empyema. Some ALI with one cavity  - deggree of CT underwhelming for degree of hypoxemia  On 08/10/2014 and she seems well diuresed    PLAN - get duplex LE - for resp failure, rule out DVT. Will see if we can get this done today Might need CTA if she is not improving - Supplemental O2 to maintain SpO2 > 90% ;  high flow O2 - Aggressive diuresis, keep negative balance - Continue systemic steroids - Continue empiric antibiotics -    Asthma without acute exacerbation -  BDs nebulized - Scheduled budesonide - PRN albuterol  Patient and brother explained and are understanding and verbalized agreement    .Dr. Brand Males, M.D., [facility].C.P Pulmonary and Critical Care Medicine Staff Physician [facility] Pager: [phone], If no answer or between  15:00h - 7:00h: call [phone]  08/10/2014 10:33 AM

## 2014-08-10 NOTE — Progress Notes (Signed)
*  PRELIMINARY RESULTS* Vascular Ultrasound Lower extremity venous duplex has been completed.  Preliminary findings: negative for DVT  Landry Mellow, RDMS, RVT  08/10/2014, 11:12 AM

## 2014-08-11 DIAGNOSIS — R590 Localized enlarged lymph nodes: Secondary | ICD-10-CM

## 2014-08-11 DIAGNOSIS — J9600 Acute respiratory failure, unspecified whether with hypoxia or hypercapnia: Secondary | ICD-10-CM | POA: Diagnosis present

## 2014-08-11 DIAGNOSIS — R1010 Upper abdominal pain, unspecified: Secondary | ICD-10-CM

## 2014-08-11 LAB — CBC
HCT: 31.5 % — ABNORMAL LOW (ref 36.0–46.0)
Hemoglobin: 10.1 g/dL — ABNORMAL LOW (ref 12.0–15.0)
MCH: 30 pg (ref 26.0–34.0)
MCHC: 32.1 g/dL (ref 30.0–36.0)
MCV: 93.5 fL (ref 78.0–100.0)
PLATELETS: 362 10*3/uL (ref 150–400)
RBC: 3.37 MIL/uL — AB (ref 3.87–5.11)
RDW: 14.5 % (ref 11.5–15.5)
WBC: 28.5 10*3/uL — AB (ref 4.0–10.5)

## 2014-08-11 LAB — BASIC METABOLIC PANEL
ANION GAP: 9 (ref 5–15)
BUN: 27 mg/dL — AB (ref 6–20)
CO2: 26 mmol/L (ref 22–32)
Calcium: 8.3 mg/dL — ABNORMAL LOW (ref 8.9–10.3)
Chloride: 105 mmol/L (ref 101–111)
Creatinine, Ser: 0.61 mg/dL (ref 0.44–1.00)
GFR calc non Af Amer: >60 mL/min (ref >60)
Glucose, Bld: 120 mg/dL — ABNORMAL HIGH (ref 65–99)
Potassium: 4.1 mmol/L (ref 3.5–5.1)
Sodium: 140 mmol/L (ref 135–145)

## 2014-08-11 LAB — BODY FLUID CULTURE: CULTURE: NO GROWTH

## 2014-08-11 LAB — PHOSPHORUS: Phosphorus: 4.5 mg/dL (ref 2.5–4.6)

## 2014-08-11 LAB — MAGNESIUM: MAGNESIUM: 2.3 mg/dL (ref 1.7–2.4)

## 2014-08-11 MED ORDER — PREDNISONE 20 MG PO TABS
40.0000 mg | ORAL_TABLET | Freq: Every day | ORAL | Status: DC
  Administered 2014-08-11 – 2014-08-13 (×3): 40 mg via ORAL
  Filled 2014-08-11 (×3): qty 2

## 2014-08-11 MED ORDER — AMOXICILLIN-POT CLAVULANATE ER 1000-62.5 MG PO TB12
2.0000 | ORAL_TABLET | Freq: Two times a day (BID) | ORAL | Status: DC
  Administered 2014-08-11 – 2014-08-13 (×5): 2 via ORAL
  Filled 2014-08-11 (×6): qty 2

## 2014-08-11 MED ORDER — FUROSEMIDE 40 MG PO TABS
40.0000 mg | ORAL_TABLET | Freq: Every day | ORAL | Status: DC
  Administered 2014-08-11 – 2014-08-13 (×3): 40 mg via ORAL
  Filled 2014-08-11 (×3): qty 1

## 2014-08-11 NOTE — Progress Notes (Signed)
Date:  Aug 11, 2014 U.R. performed for needs and level of care. Will continue to follow for Case Management needs.  Velva Harman, RN, BSN, [place]   [phone]

## 2014-08-11 NOTE — Progress Notes (Signed)
PROGRESS NOTE  [name] QQI:[id number] DOB: 01-08-1941 DOA: 08/02/2014 PCP: Mayra Neer, MD  HPI/Recap of past 24 hours: Patient is a 74 year old female past history of asthma and hypertension admitted on 5/7 with several days of progressively worsening shortness of breath along with productive cough with yellowish sputum and subjective fevers and chills.  Patient started on IV fluids and antibiotics, her white blood cell count has progressively worsened.  reported history of diarrhea before coming in, however C. difficile cultures have remained negative. Overnight, more tachypneic and requiring additional oxygenation. Chest x-ray is no large amount of pleural effusion and progressive airspace disease. Pulmonary consulted.  Started on IV Lasix and has diuresed some, however she still quite tachypneic, tachycardic requiring large amount of oxygenation.   She complains of shortness of breath and fatigue. With little improvement in her oxygenation and chest x-ray, pulmonary took patient for thoracentesis done 5/13. Almost 1 L of purulent fluid removed.  Patient much improved by 5/14, although by morning of 5/15 she started requiring more oxygen. Given a dose of Lasix and had significant output and since then, breathing much improved. Patient comfortably at 3 L nasal cannula. White blood cell count down. Patient self with no complaints.     Assessment/Plan: Principal Problem:  Sepsis due to community-acquired pneumonia involving right middle and lower lobes (possible empyema ) causing Acute respiratory failure with hypoxia: Patient met criteria for sepsis based on tachycardia, tachypnea, hypotension, oxygen desaturation and elevated lactic acid level with pulmonary source. Has responded well to IV antibiotics, leukocytosis initially improving, now starting to trend back up. Agree with pulmonary that CT of chest done 5/14 mild in comparison to clinical presentation of hypoxia. Lower from a Dopplers  unrevealing. Added Zosyn and with Lasix for volume overload, patient has significantly improved.  Critical care following results of empyema. Given cavitary lesion, antibiotics changed to Augmentin and likely will need for extended period of time. Infectious disease to follow-up.    Abdominal lymphadenopathy: Mesenteric adenopathy seen on abdominal CT. Lymphoma versus infectious consideration. Recommend CT scan follow-up in a month.   diarrhea: Unclear etiology. C. difficile and stool panel negative. Resolved.  Code Status: full code  Family communication: Spoke with family at the bedside  Disposition: Transfer to floor   Consultants:  Pulmonary  Procedures:  None  Antibiotics:  IV Rocephin 5/7-5/14  IV Zithromax 5/7-5/14  IV vancomycin 5/10-5/16  IV Zosyn 5/15-5/16  By mouth ampicillin 5/16-present   Objective: BP 111/50 mmHg  Pulse 90  Temp(Src) 97.7 F (36.5 C) (Oral)  Resp 18  Ht _0  (1.499 m)  Wt 60.3 kg (132 lb 15 oz)  BMI 26.84 kg/m2  SpO2 95%  Intake/Output Summary (Last 24 hours) at 08/11/14 1227 Last data filed at 08/11/14 0900  Gross per 24 hour  Intake   1145 ml  Output   2205 ml  Net  -1060 ml   Filed Weights   08/06/14 0450 08/09/14 0600 08/10/14 0400  Weight: 66.8 kg (147 lb 4.3 oz) 62 kg (136 lb 11 oz) 60.3 kg (132 lb 15 oz)    Exam:   General:  alert and oriented 3, no acute distress  Cardiovascular: regular rhythm  Respiratory: Decreased breath sounds bibasilar, prolonged expiratory phase bilaterally, improved  Abdomen: soft, nontender, nondistended, positive bowel sounds  Musculoskeletal: no clubbing or cyanosis or edema   Data Reviewed: Basic Metabolic Panel:  Recent Labs Lab 08/07/14 0335 08/08/14 0330 08/09/14 0430 08/10/14 0440 08/10/14 0905 08/11/14 0423  NA 142 142 141  --  140 140  K 3.2* 3.8 4.2  --  4.3 4.1  CL 92* 97* 100*  --  104 105  CO2 38* 35* 32  --  27 26  GLUCOSE 140* 128* 124*  --  150* 120*   BUN 19 24* 30*  --  26* 27*  CREATININE 0.46 0.48 0.52  --  0.55 0.61  CALCIUM 8.2* 8.3* 8.3*  --  8.5* 8.3*  MG  --  2.2  --  2.2  --  2.3  PHOS  --   --   --  4.0  --  4.5   Liver Function Tests:  Recent Labs Lab 08/05/14 0346 08/09/14 0430  AST 57* 37  ALT 44 38  ALKPHOS 116 87  BILITOT 0.6 0.6  PROT 5.4* 6.0*  ALBUMIN 2.2* 2.2*   No results for input(s): LIPASE, AMYLASE in the last 168 hours. No results for input(s): AMMONIA in the last 168 hours. CBC:  Recent Labs Lab 08/07/14 0335 08/08/14 0330 08/09/14 0430 08/10/14 0905 08/11/14 0423  WBC 43.1* 40.2* 33.5* 35.1* 28.5*  HGB 11.5* 11.1* 10.4* 10.7* 10.1*  HCT 36.9 34.4* 32.8* 34.3* 31.5*  MCV 93.4 93.2 94.3 93.7 93.5  PLT 367 356 333 387 362   Cardiac Enzymes:   No results for input(s): CKTOTAL, CKMB, CKMBINDEX, TROPONINI in the last 168 hours. BNP (last 3 results)  Recent Labs  08/05/14 0007  BNP 519.1*    ProBNP (last 3 results) No results for input(s): PROBNP in the last 8760 hours.  CBG:  Recent Labs Lab 08/07/14 1211  GLUCAP 145*    Recent Results (from the past 240 hour(s))  Blood culture (routine x 2)     Status: None   Collection Time: 08/02/14  3:32 PM  Result Value Ref Range Status   Specimen Description BLOOD RIGHT ANTECUBITAL  Final   Special Requests BOTTLES DRAWN AEROBIC AND ANAEROBIC 4 CC EACH  Final   Culture   Final    NO GROWTH 5 DAYS Performed at [facility]    Report Status 08/09/2014 FINAL  Final  Blood culture (routine x 2)     Status: None   Collection Time: 08/02/14  3:45 PM  Result Value Ref Range Status   Specimen Description BLOOD RIGHT ANTECUBITAL  Final   Special Requests BOTTLES DRAWN AEROBIC AND ANAEROBIC 5 CC EACH  Final   Culture   Final    NO GROWTH 5 DAYS Performed at [facility]    Report Status 08/09/2014 FINAL  Final  MRSA PCR Screening     Status: None   Collection Time: 08/02/14  7:00 PM  Result Value Ref Range Status    MRSA by PCR NEGATIVE NEGATIVE Final    Comment:        The GeneXpert MRSA Assay (FDA approved for NASAL specimens only), is one component of a comprehensive MRSA colonization surveillance program. It is not intended to diagnose MRSA infection nor to guide or monitor treatment for MRSA infections.   Urine culture     Status: None   Collection Time: 08/02/14  8:38 PM  Result Value Ref Range Status   Specimen Description URINE, RANDOM  Final   Special Requests NONE  Final   Colony Count NO GROWTH Performed at [facility]   Final   Culture NO GROWTH Performed at [facility]   Final   Report Status 08/03/2014 FINAL  Final  Clostridium Difficile  by PCR     Status: None   Collection Time: 08/03/14 10:13 AM  Result Value Ref Range Status   C difficile by pcr NEGATIVE NEGATIVE Final  Body fluid culture     Status: None   Collection Time: 08/08/14 11:12 AM  Result Value Ref Range Status   Specimen Description PLEURAL  Final   Special Requests NONE  Final   Gram Stain   Final    MODERATE WBC PRESENT, PREDOMINANTLY PMN NO ORGANISMS SEEN Performed at [facility]    Culture   Final    NO GROWTH 3 DAYS Performed at [facility]    Report Status 08/11/2014 FINAL  Final     Studies: No results found.  Scheduled Meds: . amoxicillin-clavulanate  2 tablet Oral Q12H  . antiseptic oral rinse  7 mL Mouth Rinse q12n4p  . azelastine  1 spray Each Nare QHS  . budesonide (PULMICORT) nebulizer solution  0.5 mg Nebulization BID  . enoxaparin (LOVENOX) injection  40 mg Subcutaneous Q24H  . fluticasone  1-2 spray Each Nare QHS  . furosemide  40 mg Oral Daily  . ipratropium-albuterol  3 mL Nebulization Q6H  . LORazepam  0.5 mg Intravenous Once  . predniSONE  40 mg Oral Q breakfast  . raloxifene  60 mg Oral Q breakfast    Continuous Infusions: . sodium chloride 10 mL/hr at 08/08/14 1100     Time spent: 25 minutes  [place]  [facility] Pager [phone]. If 7PM-7AM, please contact night-coverage at [email or web address], password [facility] 08/11/2014, 12:27 PM  LOS: 9 days

## 2014-08-11 NOTE — Progress Notes (Signed)
Name: [name] MRN: [id number] DOB: Feb 25, 1941    ADMISSION DATE:  08/02/2014 CONSULTATION DATE:  08/06/2014  REFERRING MD :  Dr. [place] Pink  CHIEF COMPLAINT:  SOB  BRIEF PATIENT DESCRIPTION: 74 year old female admitted 5/7 with sepsis and hypoxic respiratory failure (PO2 54 mmHg on room air w/ sats 88%) both secondary to PNA. She was started on empiric ABX for CAP, but did not improve as anticipated. She had progressive hypoxia and SOB eventually requiring BiPAP 5/10. 5/11 hypoxia remained and PCCM was consulted.   SIGNIFICANT EVENTS /studies -CXR 10/18/11>>Chronic scarring and volume loss in the right middle lobe and lingula similar to prior examinations -Exhaled nitric oxide 12/07/11>>8 ppb (normal) -Lt heart cath 01/24/12 >> Normal coronaries, LVEDP 8 mmHg, EF 55% -PFT 12/05/12 >> FEV1 1.49 (82%), FEV1% 69, FEF 25-75% 1.09 (69%), TLC 4.38 (85%), DLCO 87%, borderline BD response ..................................... 5/7 admitted for CAP 5/10 hypoxemia worsened: FIO2 62mmHg on 100% NRB.  BiPAP started 5/11 persistent hypoxemia, PCCM consulted -5/7 CT abd > Right middle and lower lobe pneumonia with small right pleural effusion. At least 3 nonspecific hepatic lesions. 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 Cholelithiasis -5/8 Echo > LVEF 50-55%, mild MR 08/08/14: 1100 mL [place] thora on right side 08/09/2014: RN report improved from face mask to 15L O2. Walked with PT. Looking better. THora rsult reviewed  Right now  frank exudate with LDH 6800 with 16K wbc and 86% PMN  5/15 increased WOB, better after lasix 5/16: looks fantastic, converted to oral abx, pred, working on evaluating home O2 needs.   VITAL SIGNS: Temp:  [97.8 F (36.6 C)-98.4 F (36.9 C)] 98.2 F (36.8 C) (05/16 0725) Resp:  [19-38] 24 (05/16 0800) BP: (107-143)/(39-93) 111/93 mmHg (05/16 0800) SpO2:  [91 %-97 %] 93 % (05/16 0800) FiO2 (%):  [30 %-40 %] 30 % (05/15  1200)  Intake/Output Summary (Last 24 hours) at 08/11/14 0844 Last data filed at 08/11/14 0524  Gross per 24 hour  Intake   1085 ml  Output   2630 ml  Net  -1545 ml    PHYSICAL EXAMINATION: General:  Elderly female on 2 liters w/out distress Neuro:  Alert, oriented x 3. Moves all 4s. Speech normal HEENT:  [place]/AT, no JVD, PERRL , O2 on. Neck supple Cardiovascular:  Tachy, regular Lungs:  Basilar rales, w/out accessory muscle use  Abdomen:  Soft, non-tender, non-distended Musculoskeletal:  No acute deformity or ROM limitaiton.  Skin:  Grossly intact   PULMONARY  Recent Labs Lab 08/06/14 0245 08/06/14 0738 08/06/14 2212 08/07/14 0856 08/08/14 0857  PHART 7.510* 7.522* 7.585* 7.586* 7.537*  PCO2ART 49.8* 47.7* 39.8 36.6 37.8  PO2ART 73.8* 68.6* 67.4* 82.4 66.2*  HCO3 39.6* 38.9* 37.9* 34.9* 32.0*  TCO2 35.4 34.0 33.2 30.5 28.3  O2SAT 95.5 94.6 94.8 96.8 94.0   CBC  Recent Labs Lab 08/09/14 0430 08/10/14 0905 08/11/14 0423  HGB 10.4* 10.7* 10.1*  HCT 32.8* 34.3* 31.5*  WBC 33.5* 35.1* 28.5*  PLT 333 387 362    COAGULATION No results for input(s): INR in the last 168 hours.  CARDIAC   No results for input(s): TROPONINI in the last 168 hours. No results for input(s): PROBNP in the last 168 hours. CHEMISTRY  Recent Labs Lab 08/07/14 0335 08/08/14 0330 08/09/14 0430 08/10/14 0440 08/10/14 0905 08/11/14 0423  NA 142 142 141  --  140 140  K 3.2* 3.8 4.2  --  4.3 4.1  CL 92* 97* 100*  --  104 105  CO2 38* 35* 32  --  27 26  GLUCOSE 140* 128* 124*  --  150* 120*  BUN 19 24* 30*  --  26* 27*  CREATININE 0.46 0.48 0.52  --  0.55 0.61  CALCIUM 8.2* 8.3* 8.3*  --  8.5* 8.3*  MG  --  2.2  --  2.2  --  2.3  PHOS  --   --   --  4.0  --  4.5   Estimated Creatinine Clearance: 49.4 mL/min (by C-G formula based on Cr of 0.61).  LIVER  Recent Labs Lab 08/05/14 0346 08/09/14 0430  AST 57* 37  ALT 44 38  ALKPHOS 116 87  BILITOT 0.6 0.6  PROT 5.4* 6.0*   ALBUMIN 2.2* 2.2*   INFECTIOUS  Recent Labs Lab 08/06/14 2337 08/07/14 0335 08/08/14 0330  PROCALCITON 3.30 3.04 1.56    ENDOCRINE CBG (last 3)  No results for input(s): GLUCAP in the last 72 hours.   IMAGING [phone]  - one more images have been personally visualized and highlighted  Ct Chest Wo Contrast  08/09/2014   CLINICAL DATA:  Progressive worsening shortness of breath. Productive cough with subjective fever and chills. Elevated white blood cell count.  EXAM: CT CHEST WITHOUT CONTRAST  TECHNIQUE: Multidetector CT imaging of the chest was performed following the standard protocol without IV contrast.  COMPARISON:  Chest radiograph 08/09/2014 CT abdomen 08/02/2014, CT chest 05/02/2007  FINDINGS: Mediastinum/Nodes: No axillary supraclavicular lymphadenopathy. Right PICC line noted. No mediastinal or hilar lymphadenopathy. Trace pericardial effusion. Esophagus is normal.  Lungs/Pleura: There is some improvement in the dense right lower lobe consolidation seen on radiograph CT of 08/02/2014. There is persistent consolidation in the right lower lobe anteriorly. There is a small focus of thin walled cavitation anterior right lower lobe measuring 3 cm (image 37, series 9) which is increased from 2 cm on prior. There is a small right pleural effusion. There is interlobular septal thickening in both lungs. There is patchy ground-glass opacities in both lungs.  Upper abdomen: Diffuse low-attenuation within liver. There is a calcified lesion left hepatic lobe unchanged in size from 2009. The peripheral calcification is noted.  Musculoskeletal: Bilateral breast prosthetic. No aggressive osseous lesion.  IMPRESSION: 1. Improvement in dense consolidation in the right lower lobe compared to CT of 08/02/2014 is consistent with improving pneumonia. 2. There is a new thin-walled cavitary lesion in the anterior right lower lobe adjacent to the consolidation which is increased in size from prior and is likely  post infectious. No clear evidence of superinfection of this thin-walled cavitary lesion. 3. Scattered ground-glass opacities and interlobular septal is increased from 08/01/2004 and likely represents mild pulmonary edema and interstitial edema. Multifocal infection is felt less likely. 4. Hepatic steatosis.   Electronically Signed   By: Suzy Bouchard M.D.   On: 08/09/2014 14:31   No new film 5/16  ASSESSMENT / PLAN:   Acute hypoxemic respiratory failure with ALI/CAP(NOS) presentation on 08/02/2014 with  diagnosis of Rt effusion/empyema 08/08/14 - following failure to improve (s/p 1.1L thora with exudative effusion with LDH 6800)  - CT 08/09/14 with nearly resolved empyema. Some ALI with one cavity  - degree of CT underwhelming for degree of hypoxemia    -Tolerated diuresis well. Looks remarkably better c/w last exam. -[place] negative -Off high flow   PLAN - Supplemental O2 to maintain SpO2 > 90%; walking oximetry daily as we work towards d/c to eval O2 needs at home  -  lasix x one more day for neg fluid balance  - Continue systemic steroids-->begin taper (goal to off over 7-10d) - Continue empiric antibiotics; change to Augmentin (DC vanc and zosyn). Given cavitary changes on CT scan think she will need extended course of therapy. She is currently on day 9 of therapy. We will extend this to complete at least total 21 days given cavitary changes on CT scan    Asthma without acute exacerbation -  BDs nebulized - Scheduled budesonide - PRN albuterol  Looks great. Ready to leave ICU  Erick Colace ACNP-BC [place] Pager # [phone] OR # [phone] if no answer   STAFF NOTE: I, Merrie Roof, MD FACP have personally reviewed patient's available data, including medical history, events of note, physical examination and test results as part of my evaluation. I have discussed with resident/NP and other care providers such as pharmacist, RN and RRT. In addition, I  personally evaluated patient and elicited key findings of: Clinically improved significantly, nothing has grown from pleural fluid,  CT chest with abscess, no staph has grown in pleural fluid, need close observation of pcxr, fever curve and clinical status, low threshold to place a chest  Tube if declines as stated, will call ID, has been changed to Augmentin, however  I have concerns that we need 21 days ABX and IV? For course given lung abscess and fluid analysis, will call ID, WBC is down, no fevers , Echo also without veg, if declines will need tee  Lavon Paganini. Titus Mould, MD, [place] Pgr: [facility] 08/11/2014 10:11 AM

## 2014-08-11 NOTE — Care Management Note (Signed)
Case Management Note  Patient Details  Name: Torina Ey MRN: [id number] Date of Birth: 11-Jul-1940  Subjective/Objective:       Patient is a 74 year old female past history of asthma and hypertension admitted on 5/7 with several days of progressively worsening shortness of breath along with productive cough with yellowish sputum and subjective fevers and chills. Patient started on IV fluids and antibiotics, her white blood cell count has progressively worsened. reported history of diarrhea before coming in, however C. difficile cultures have remained negative. Overnight, more tachypneic and requiring additional oxygenation. Chest x-ray is no large amount of pleural effusion and progressive airspace disease. Pulmonary consulted. Started on IV Lasix and has diuresed some, however she still quite tachypneic, tachycardic requiring large amount of oxygenation.               Action/Plan: tbd  Expected Discharge Date:       [id number]           Expected Discharge Plan:  Home/Self Care  In-House Referral:  NA  Discharge planning Services  CM Consult  Post Acute Care Choice:  NA Choice offered to:  NA  DME Arranged:    DME Agency:     HH Arranged:    HH Agency:     Status of Service:  In process, will continue to follow  Medicare Important Message Given:    Date Medicare IM Given:    Medicare IM give by:    Date Additional Medicare IM Given:    Additional Medicare Important Message give by:     If discussed at [place] of Stay Meetings, dates discussed:    Additional Comments:  Leeroy Cha, RN 08/11/2014, 8:54 AM

## 2014-08-11 NOTE — Progress Notes (Signed)
Physical Therapy Treatment Patient Details Name: [name] MRN: [id number] DOB: October 14, 1940 Today's Date: 08/11/2014    History of Present Illness Pt admitted 5/7 with progressive SOB    PT Comments    Patient improving in mobility. Ambulated on 3 l, sats 88-96% during activity. HR 99-115.  Follow Up Recommendations  Home health PT;Supervision/Assistance - 24 hour     Equipment Recommendations  None recommended by PT    Recommendations for Other Services       Precautions / Restrictions Precautions Precautions: Fall Precaution Comments: O2, [place] Restrictions Weight Bearing Restrictions: No    Mobility  Bed Mobility                  Transfers   Equipment used: Rolling walker (2 wheeled) Transfers: Sit to/from Stand Sit to Stand: Min assist         General transfer comment: cues for transition position and use of UEs to self assist  Ambulation/Gait Ambulation/Gait assistance: Min assist;+2 safety/equipment Ambulation Distance (Feet): 40 Feet (then 80') Assistive device: Rolling walker (2 wheeled) Gait Pattern/deviations: Step-through pattern Gait velocity: very slow, guarded   General Gait Details: cues for pace, posture and position from RW; multiple short standing rests to complete task   Stairs            Wheelchair Mobility    Modified Rankin (Stroke Patients Only)       Balance Overall balance assessment: Needs assistance         Standing balance support: During functional activity;Bilateral upper extremity supported Standing balance-Leahy Scale: Fair                      Cognition Arousal/Alertness: Awake/alert                          Exercises      General Comments        Pertinent Vitals/Pain Pain Assessment: 0-10 Pain Score: 4  Pain Location: abdomen Pain Descriptors / Indicators: Discomfort    Home Living                      Prior Function            PT Goals (current  goals can now be found in the care plan section) Progress towards PT goals: Progressing toward goals    Frequency  Min 3X/week    PT Plan Current plan remains appropriate    Co-evaluation             End of Session Equipment Utilized During Treatment: Gait belt;Oxygen Activity Tolerance: Patient tolerated treatment well Patient left: in chair;with family/visitor present;with nursing/sitter in room     Time: 0931-0948 PT Time Calculation (min) (ACUTE ONLY): 17 min  Charges:  $Gait Training: 8-22 mins                    G Codes:      [name] 08/11/2014, 11:44 AM [name] PT [phone]

## 2014-08-11 NOTE — Progress Notes (Signed)
Patient assessed at 1112. I am in agreement with the previous assessment on today. Joycelyn Schmid, RN

## 2014-08-12 LAB — CBC
HEMATOCRIT: 32.7 % — AB (ref 36.0–46.0)
Hemoglobin: 10.4 g/dL — ABNORMAL LOW (ref 12.0–15.0)
MCH: 30 pg (ref 26.0–34.0)
MCHC: 31.8 g/dL (ref 30.0–36.0)
MCV: 94.2 fL (ref 78.0–100.0)
PLATELETS: 408 10*3/uL — AB (ref 150–400)
RBC: 3.47 MIL/uL — AB (ref 3.87–5.11)
RDW: 14.9 % (ref 11.5–15.5)
WBC: 31.3 10*3/uL — AB (ref 4.0–10.5)

## 2014-08-12 LAB — BASIC METABOLIC PANEL
ANION GAP: 10 (ref 5–15)
BUN: 24 mg/dL — ABNORMAL HIGH (ref 6–20)
CO2: 25 mmol/L (ref 22–32)
Calcium: 8.4 mg/dL — ABNORMAL LOW (ref 8.9–10.3)
Chloride: 104 mmol/L (ref 101–111)
Creatinine, Ser: 0.64 mg/dL (ref 0.44–1.00)
Glucose, Bld: 124 mg/dL — ABNORMAL HIGH (ref 65–99)
POTASSIUM: 3.4 mmol/L — AB (ref 3.5–5.1)
Sodium: 139 mmol/L (ref 135–145)

## 2014-08-12 LAB — PHOSPHORUS: Phosphorus: 3.2 mg/dL (ref 2.5–4.6)

## 2014-08-12 LAB — MAGNESIUM: MAGNESIUM: 2.2 mg/dL (ref 1.7–2.4)

## 2014-08-12 MED ORDER — OXYCODONE HCL 5 MG PO TABS
5.0000 mg | ORAL_TABLET | ORAL | Status: DC | PRN
  Administered 2014-08-12 – 2014-08-13 (×3): 5 mg via ORAL
  Filled 2014-08-12 (×3): qty 1

## 2014-08-12 NOTE — Progress Notes (Signed)
 PROGRESS NOTE  [name] MRN:[id number] DOB: 08/02/1940 DOA: 08/02/2014 PCP: SHAW,KIMBERLEE, MD  HPI/Recap of past 24 hours: Patient is a 73-year-old female past history of asthma and hypertension admitted on 5/7 with several days of progressively worsening shortness of breath along with productive cough with yellowish sputum and subjective fevers and chills.  Admitted for sepsis secondary to community-acquired pneumonia. Although some mild initial improvement, despite IV fluids and antibiotics, patient's white blood cell count progressively worsened and patient became more tachypneic and requiring additional oxygenation. Follow-up chest x-ray noted large pleural effusion and progressive airspace disease. Pulmonary consulted and patient underwent thoracentesis done 5/13 moving 1 L of purulent fluid. Since then, patient has had continued improvement able to wean down to currently 3 L of oxygen. Follow-up CT noted some cavitation.   Today, patient doing overall well. Some pain with deep inspiration at the right base of site of cavitary pneumonia. Otherwise, no pain or other complaints.     Assessment/Plan: Principal Problem:  Sepsis due to community-acquired pneumonia involving right middle and lower lobes with empyema and cavitation causing Acute respiratory failure with hypoxia: Patient met criteria for sepsis based on tachycardia, tachypnea, hypotension, oxygen desaturation and elevated lactic acid level with pulmonary source. Now resolved. Given cavitary lesion, antibiotics changed to Augmentin and likely will need for a month.  Noted mild increase in white blood cell count today. Taper down steroids.    Abdominal lymphadenopathy: Mesenteric adenopathy seen on abdominal CT. Lymphoma versus infectious consideration. Recommend CT scan follow-up in a month.   diarrhea: Unclear etiology. C. difficile and stool panel negative. Resolved.  Code Status: full code  Family communication: Husband at  the bedside  Disposition: Home with home health, anticipate discharge in next 24-48 hours. Need to see further improvement in white blood cell count   Consultants:  Pulmonary  Procedures:  None  Antibiotics:  IV Rocephin 5/7-5/14  IV Zithromax 5/7-5/14  IV vancomycin 5/10-5/16  IV Zosyn 5/15-5/16  By mouth ampicillin 5/16-present   Objective: BP 115/58 mmHg  Pulse 90  Temp(Src) 98.1 F (36.7 C) (Oral)  Resp 18  Ht 4' 11" (1.499 m)  Wt 59.6 kg (131 lb 6.3 oz)  BMI 26.52 kg/m2  SpO2 96%  Intake/Output Summary (Last 24 hours) at 08/12/14 1345 Last data filed at 08/12/14 0749  Gross per 24 hour  Intake    480 ml  Output    950 ml  Net   -470 ml   Filed Weights   08/09/14 0600 08/10/14 0400 08/12/14 0500  Weight: 62 kg (136 lb 11 oz) 60.3 kg (132 lb 15 oz) 59.6 kg (131 lb 6.3 oz)    Exam:   General:  alert and oriented 3, no acute distress  Cardiovascular: regular rhythm  Respiratory: Decreased breath sounds with crackles at right base,  Abdomen: soft, nontender, nondistended, positive bowel sounds  Musculoskeletal: no clubbing or cyanosis or edema   Data Reviewed: Basic Metabolic Panel:  Recent Labs Lab 08/08/14 0330 08/09/14 0430 08/10/14 0440 08/10/14 0905 08/11/14 0423 08/12/14 0400  NA 142 141  --  140 140 139  K 3.8 4.2  --  4.3 4.1 3.4*  CL 97* 100*  --  104 105 104  CO2 35* 32  --  27 26 25  GLUCOSE 128* 124*  --  150* 120* 124*  BUN 24* 30*  --  26* 27* 24*  CREATININE 0.48 0.52  --  0.55 0.61 0.64  CALCIUM 8.3* 8.3*  --    8.5* 8.3* 8.4*  MG 2.2  --  2.2  --  2.3 2.2  PHOS  --   --  4.0  --  4.5 3.2   Liver Function Tests:  Recent Labs Lab 08/09/14 0430  AST 37  ALT 38  ALKPHOS 87  BILITOT 0.6  PROT 6.0*  ALBUMIN 2.2*   No results for input(s): LIPASE, AMYLASE in the last 168 hours. No results for input(s): AMMONIA in the last 168 hours. CBC:  Recent Labs Lab 08/08/14 0330 08/09/14 0430 08/10/14 0905  08/11/14 0423 08/12/14 0400  WBC 40.2* 33.5* 35.1* 28.5* 31.3*  HGB 11.1* 10.4* 10.7* 10.1* 10.4*  HCT 34.4* 32.8* 34.3* 31.5* 32.7*  MCV 93.2 94.3 93.7 93.5 94.2  PLT 356 333 387 362 408*   Cardiac Enzymes:   No results for input(s): CKTOTAL, CKMB, CKMBINDEX, TROPONINI in the last 168 hours. BNP (last 3 results)  Recent Labs  08/05/14 0007  BNP 519.1*    ProBNP (last 3 results) No results for input(s): PROBNP in the last 8760 hours.  CBG:  Recent Labs Lab 08/07/14 1211  GLUCAP 145*    Recent Results (from the past 240 hour(s))  Blood culture (routine x 2)     Status: None   Collection Time: 08/02/14  3:32 PM  Result Value Ref Range Status   Specimen Description BLOOD RIGHT ANTECUBITAL  Final   Special Requests BOTTLES DRAWN AEROBIC AND ANAEROBIC 4 CC EACH  Final   Culture   Final    NO GROWTH 5 DAYS Performed at [facility]    Report Status 08/09/2014 FINAL  Final  Blood culture (routine x 2)     Status: None   Collection Time: 08/02/14  3:45 PM  Result Value Ref Range Status   Specimen Description BLOOD RIGHT ANTECUBITAL  Final   Special Requests BOTTLES DRAWN AEROBIC AND ANAEROBIC 5 CC EACH  Final   Culture   Final    NO GROWTH 5 DAYS Performed at [facility]    Report Status 08/09/2014 FINAL  Final  MRSA PCR Screening     Status: None   Collection Time: 08/02/14  7:00 PM  Result Value Ref Range Status   MRSA by PCR NEGATIVE NEGATIVE Final    Comment:        The GeneXpert MRSA Assay (FDA approved for NASAL specimens only), is one component of a comprehensive MRSA colonization surveillance program. It is not intended to diagnose MRSA infection nor to guide or monitor treatment for MRSA infections.   Urine culture     Status: None   Collection Time: 08/02/14  8:38 PM  Result Value Ref Range Status   Specimen Description URINE, RANDOM  Final   Special Requests NONE  Final   Colony Count NO GROWTH Performed at [facility]   Final   Culture NO GROWTH Performed at [facility]   Final   Report Status 08/03/2014 FINAL  Final  Clostridium Difficile by PCR     Status: None   Collection Time: 08/03/14 10:13 AM  Result Value Ref Range Status   C difficile by pcr NEGATIVE NEGATIVE Final  Body fluid culture     Status: None   Collection Time: 08/08/14 11:12 AM  Result Value Ref Range Status   Specimen Description PLEURAL  Final   Special Requests NONE  Final   Gram Stain   Final    MODERATE WBC PRESENT, PREDOMINANTLY PMN NO ORGANISMS SEEN Performed at [facility]    Culture   Final    NO GROWTH 3 DAYS Performed at [facility]    Report Status 08/11/2014 FINAL  Final     Studies: No results found.  Scheduled Meds: . amoxicillin-clavulanate  2 tablet Oral Q12H  . antiseptic oral rinse  7 mL Mouth Rinse q12n4p  . azelastine  1 spray Each Nare QHS  . budesonide (PULMICORT) nebulizer solution  0.5 mg Nebulization BID  . enoxaparin (LOVENOX) injection  40 mg Subcutaneous Q24H  . fluticasone  1-2 spray Each Nare QHS  . furosemide  40 mg Oral Daily  . ipratropium-albuterol  3 mL Nebulization Q6H  . LORazepam  0.5 mg Intravenous Once  . predniSONE  40 mg Oral Q breakfast  . raloxifene  60 mg Oral Q breakfast    Continuous Infusions: . sodium chloride 10 mL/hr at 08/08/14 1100     Time spent: 25 minutes  KRISHNAN,SENDIL K  [facility] Pager [phone]. If 7PM-7AM, please contact night-coverage at [email or web address], password [email or web address] 08/12/2014, 1:45 PM  LOS: 10 days              

## 2014-08-13 DIAGNOSIS — S27309S Unspecified injury of lung, unspecified, sequela: Secondary | ICD-10-CM

## 2014-08-13 DIAGNOSIS — E876 Hypokalemia: Secondary | ICD-10-CM

## 2014-08-13 LAB — CBC
HCT: 32.9 % — ABNORMAL LOW (ref 36.0–46.0)
HEMOGLOBIN: 10.3 g/dL — AB (ref 12.0–15.0)
MCH: 29.3 pg (ref 26.0–34.0)
MCHC: 31.3 g/dL (ref 30.0–36.0)
MCV: 93.7 fL (ref 78.0–100.0)
PLATELETS: 418 10*3/uL — AB (ref 150–400)
RBC: 3.51 MIL/uL — ABNORMAL LOW (ref 3.87–5.11)
RDW: 14.3 % (ref 11.5–15.5)
WBC: 31.5 10*3/uL — ABNORMAL HIGH (ref 4.0–10.5)

## 2014-08-13 LAB — BASIC METABOLIC PANEL
Anion gap: 7 (ref 5–15)
BUN: 21 mg/dL — ABNORMAL HIGH (ref 6–20)
CALCIUM: 8.7 mg/dL — AB (ref 8.9–10.3)
CO2: 26 mmol/L (ref 22–32)
CREATININE: 0.57 mg/dL (ref 0.44–1.00)
Chloride: 105 mmol/L (ref 101–111)
GFR calc Af Amer: >60 mL/min (ref >60)
GFR calc non Af Amer: >60 mL/min (ref >60)
Glucose, Bld: 93 mg/dL (ref 65–99)
Potassium: 3.4 mmol/L — ABNORMAL LOW (ref 3.5–5.1)
Sodium: 138 mmol/L (ref 135–145)

## 2014-08-13 LAB — MAGNESIUM: MAGNESIUM: 2.2 mg/dL (ref 1.7–2.4)

## 2014-08-13 LAB — PHOSPHORUS: Phosphorus: 3.2 mg/dL (ref 2.5–4.6)

## 2014-08-13 MED ORDER — POTASSIUM CHLORIDE CRYS ER 20 MEQ PO TBCR
20.0000 meq | EXTENDED_RELEASE_TABLET | Freq: Two times a day (BID) | ORAL | Status: DC
  Administered 2014-08-13: 20 meq via ORAL
  Filled 2014-08-13: qty 1

## 2014-08-13 MED ORDER — PREDNISONE 20 MG PO TABS: 40.0000 mg | ORAL_TABLET | Freq: Every day | ORAL | Status: DC

## 2014-08-13 MED ORDER — AMOXICILLIN-POT CLAVULANATE ER 1000-62.5 MG PO TB12: 2.0000 | ORAL_TABLET | Freq: Two times a day (BID) | ORAL | Status: DC

## 2014-08-13 MED ORDER — GUAIFENESIN-DM 100-10 MG/5ML PO SYRP: 5.0000 mL | ORAL_SOLUTION | ORAL | Status: DC | PRN

## 2014-08-13 NOTE — Discharge Summary (Signed)
Physician Discharge Summary  [name] DVV:[id number] DOB: 05-May-1940 DOA: 08/02/2014  PCP: Mayra Neer, MD  Admit date: 08/02/2014 Discharge date: 08/13/2014   Recommendations for Outpatient Follow-Up:   1. Patient is being discharged home with home health PT/RN. 2. LACE score = 5, corresponding to a 5.1 % of re-admission within 30 days.    3. Please check CBC 1-2 weeks post discharge to ensure improvement in persistently elevated WBC. 4. Patient will need outpatient follow-up of abdominal lymphadenopathy with repeat CT scan recommended in one month.  Discharge Diagnosis:   Principal Problem:    Sepsis due to community acquired cavitary pneumonia/empyema Active Problems:    Abdominal lymphadenopathy    Acute respiratory failure with hypoxia    Acute lung injury    Pleural effusion, right    Acute respiratory failure    Hypokalemia   Discharge disposition:  Home.    Discharge Condition: Improved.  Diet recommendation: Low sodium, heart healthy.    History of Present Illness:   [name] is an 74 y.o. female with a PMH of asthma and hypertension admitted on 08/02/14 with several days of progressively worsening shortness of breath along with productive cough with yellowish sputum and subjective fevers and chills. She has been treated for sepsis secondary to community-acquired pneumonia. Although some mild initial improvement, despite IV fluids and antibiotics, patient's white blood cell count progressively worsened and patient became more tachypneic and requiring additional oxygenation. Follow-up chest x-ray noted large pleural effusion and progressive airspace disease. Pulmonary consulted and patient underwent thoracentesis done 08/08/14, removing 1 L of purulent fluid. Since then, patient has had continued improvement. Follow-up CT noted some cavitation.    Hospital Course by Problem:   Principal Problem:  Sepsis due to community-acquired cavitary pneumonia  and empyema - Chest x-ray done on admission showed a right middle lobe pneumonia. Initially treated with 7 days of Rocephin/azithromycin. - Due to progressively rising WBC and clinical deterioration, a F/U CXR was done 08/05/14 showing significant progression of airspace disease. Vancomycin was added to her other antibiotics. - PCCM was consulted 08/06/14 due to respiratory failure requiring BiPAP therapy. - Patient was subsequently started on Pulmicort, bronchodilators, Solu-Medrol, and diuresed with Lasix. - On 08/08/14, a thoracentesis was performed with 1100 mL of cloudy fluid removed. Cultures have remained negative. - CT of the chest, done 08/09/14 showed a thin-walled cavitary lesion with ongoing extensive right-sided pneumonia. - Zosyn was added 08/10/14 and all IV antibiotics were discontinued 08/11/14 the patient was started on oral Augmentin. - Blood cultures have remained negative. - ID consultation performed prior to discharge with recommendations of a 21 day course of antibiotics, will be discharged home on Augmentin for additional 10 days of therapy.  Active Problems:  Hypokalemia secondary to diuretics - Placed on supplementation while on diuretic therapy.   Abdominal lymphadenopathy - Mesenteric lymphadenopathy seen on abdominal CT. Follow-up CT recommended in one month. Need to rule out lymphoma. - Patient was counseled about the need for follow-up.   Acute respiratory failure with hypoxia secondary to Acute lung injury and Pleural effusion, right - Required BiPAP and high flow oxygen, which has now been weaned off. - Respiratory status dramatically improved after thoracentesis and with diuresis. - No further oxygen requirement at discharge.  Medical Consultants:    Pulmonary  Infectious Disease   Discharge Exam:   Filed Vitals:   08/13/14 1424  BP: 109/51  Pulse: 96  Temp: 98 F (36.7 C)  Resp: 20  Filed Vitals:   08/13/14 1031 08/13/14 1154 08/13/14 1424  08/13/14 1448  BP:   109/51   Pulse:   96   Temp:   98 F (36.7 C)   TempSrc:   Oral   Resp:   20   Height:      Weight:      SpO2: 94% 94% 96% 96%    Gen: NAD Cardiovascular: Mildly tachycardia, No M/R/G Respiratory: Lungs diminished with a few right base crackles Gastrointestinal: Abdomen soft, NT/ND, + BS Extremities: No C/E/C   The results of significant diagnostics from this hospitalization (including imaging, microbiology, ancillary and laboratory) are listed below for reference.     Procedures and Diagnostic Studies:   Ct Abdomen Pelvis Wo Contrast  08/02/2014   CLINICAL DATA:  diarrhea and rt sided abd pain since Friday. States that on Thursday, she slept for 24 hrs and did not eat anything  EXAM: CT ABDOMEN AND PELVIS WITHOUT CONTRAST  TECHNIQUE: Multidetector CT imaging of the abdomen and pelvis was performed following the standard protocol without IV contrast.  COMPARISON:  Radiograph from earlier the same day, and earlier studies  FINDINGS: Airspace consolidation with air bronchograms in the visualized portions of right middle lobe. Some patchy airspace opacities in the anterior and posterior basal segments right lower lobe. Small right pleural effusion. Bilateral breast prostheses are partially visualized. Patchy coronary calcifications.  Mild diffuse fatty infiltration of the liver. There is an 11 mm low-attenuation subcapsular hepatic segment 6 lesion, similar 15 mm segment 4A lesion, and a subcapsular 16 mm segment 2 lesion showing peripheral calcifications. Multiple partially calcified gallstones in the nondilated gallbladder. Unremarkable kidneys, adrenal glands, pancreas, and spleen. Unenhanced CT was performed per clinician order. Lack of IV contrast limits sensitivity and specificity, especially for evaluation of abdominal/pelvic solid viscera.  An increased number of central mesenteric lymph nodes, none greater than 1 cm short axis diameter. Mild  inflammatory/edematous changes centrally in the surrounding mesentery. No retroperitoneal or pelvic adenopathy.  Stomach, small bowel, and colon are nondilated. Urinary bladder is nondistended. Previous hysterectomy. No ascites. No free air. Minimal spondylitic changes in the lower lumbar spine.  IMPRESSION: 1. Right middle and lower lobe pneumonia with small right pleural effusion. 2. At least 3 nonspecific hepatic lesions. Consider elective outpatient MR liver with contrast for further characterization, to differentiate benign from malignant etiologies. 3. 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 Reactive mesenteric adenitis and lymphoma would be the primary considerations. 4. Cholelithiasis   Electronically Signed   By: Lucrezia Europe M.D.   On: 08/02/2014 14:52   Ct Chest Wo Contrast  08/09/2014   CLINICAL DATA:  Progressive worsening shortness of breath. Productive cough with subjective fever and chills. Elevated white blood cell count.  EXAM: CT CHEST WITHOUT CONTRAST  TECHNIQUE: Multidetector CT imaging of the chest was performed following the standard protocol without IV contrast.  COMPARISON:  Chest radiograph 08/09/2014 CT abdomen 08/02/2014, CT chest 05/02/2007  FINDINGS: Mediastinum/Nodes: No axillary supraclavicular lymphadenopathy. Right PICC line noted. No mediastinal or hilar lymphadenopathy. Trace pericardial effusion. Esophagus is normal.  Lungs/Pleura: There is some improvement in the dense right lower lobe consolidation seen on radiograph CT of 08/02/2014. There is persistent consolidation in the right lower lobe anteriorly. There is a small focus of thin walled cavitation anterior right lower lobe measuring 3 cm (image 37, series 9) which is increased from 2 cm on prior. There is a small right pleural effusion. There is interlobular septal thickening in both  lungs. There is patchy ground-glass opacities in both lungs.  Upper abdomen: Diffuse low-attenuation  within liver. There is a calcified lesion left hepatic lobe unchanged in size from 2009. The peripheral calcification is noted.  Musculoskeletal: Bilateral breast prosthetic. No aggressive osseous lesion.  IMPRESSION: 1. Improvement in dense consolidation in the right lower lobe compared to CT of 08/02/2014 is consistent with improving pneumonia. 2. There is a new thin-walled cavitary lesion in the anterior right lower lobe adjacent to the consolidation which is increased in size from prior and is likely post infectious. No clear evidence of superinfection of this thin-walled cavitary lesion. 3. Scattered ground-glass opacities and interlobular septal is increased from 08/01/2004 and likely represents mild pulmonary edema and interstitial edema. Multifocal infection is felt less likely. 4. Hepatic steatosis.   Electronically Signed   By: Suzy Bouchard M.D.   On: 08/09/2014 14:31   Dg Chest Port 1 View  08/09/2014   CLINICAL DATA:  Pneumonia  EXAM: PORTABLE CHEST - 1 VIEW  COMPARISON:  Yesterday  FINDINGS: Right PICC placed. Tip is at the cavoatrial junction. Low lung volumes. Hazy bilateral airspace disease increased. Upper normal heart size. No pneumothorax.  IMPRESSION: Right PICC placed.  Tip is at the cavoatrial junction.  Worsening bilateral airspace disease.   Electronically Signed   By: Marybelle Killings M.D.   On: 08/09/2014 08:13   Dg Chest Port 1 View  08/08/2014   CLINICAL DATA:  Right-sided thoracentesis.  EXAM: PORTABLE CHEST - 1 VIEW  COMPARISON:  One-view chest 08/08/2014.  FINDINGS: The heart size normal. Bilateral interstitial and airspace disease is similar to the prior exam. There is a significant reduction in the right pleural effusion. No pneumothorax is present. The visualized soft tissues and bony thorax are otherwise unremarkable.  IMPRESSION: 1. Interval decrease in right pleural effusion without evidence for pneumothorax. 2. Stable bilateral interstitial airspace disease concerning for  pneumonia and edema.   Electronically Signed   By: San Morelle M.D.   On: 08/08/2014 12:01   Dg Chest Port 1 View  08/08/2014   CLINICAL DATA:  Pneumonia.  EXAM: PORTABLE CHEST - 1 VIEW  COMPARISON:  08/06/2014.  FINDINGS: Mediastinum and hilar structures normal. Heart size stable. Pulmonary vascularity normal . Diffuse bilateral airspace disease noted. Stable right pleural effusion. No pneumothorax. No acute bony abnormality.  IMPRESSION: 1. Diffuse bilateral airspace disease, no interim change . 2. Persistent small right pleural effusion.   Electronically Signed   By: Marcello Moores  Register   On: 08/08/2014 07:04   Dg Chest Port 1 View  08/06/2014   CLINICAL DATA:  74 year old female with a history of shortness of breath  EXAM: PORTABLE CHEST - 1 VIEW  COMPARISON:  Chest x-ray 08/05/2014, 08/02/2014, 10/17/2012. Abdominal CT 08/02/2014  FINDINGS: Cardiomediastinal silhouette likely unchanged, partially obscured by overlying lung/ pleural disease.  Atherosclerotic calcifications.  Persisting bilateral, right greater than left airspace disease. No pneumothorax.  Likely right-sided pleural effusion.  Surgical changes of breast prosthesis.  No displaced fracture.  IMPRESSION: Persisting bilateral airspace disease, worst on the right.  Right-sided pleural effusion.  Atherosclerosis.  Signed,  Dulcy Fanny. Earleen Newport, DO  [facility]  [facility]   Electronically Signed   By: Corrie Mckusick D.O.   On: 08/06/2014 13:10   Dg Chest Port 1 View  08/05/2014   CLINICAL DATA:  74 year old female with shortness breath. History of asthma and breast cancer. Subsequent encounter.  EXAM: PORTABLE CHEST - 1 VIEW  COMPARISON:  08/02/2014  FINDINGS: Significant progression of asymmetric airspace disease. This may represent multi focal infectious infiltrates with pulmonary edema. Right-sided pleural effusion suspected. Follow-up until clearance recommended to exclude underlying  malignancy.  No gross pneumothorax.  Calcified aorta.  Cardiomegaly.  IMPRESSION: Significant progression of asymmetric airspace disease. This may represent multi focal infectious infiltrates with pulmonary edema. Right-sided pleural effusion suspected. Follow-up until clearance recommended to exclude underlying malignancy.  Calcified slightly tortuous aorta.  Cardiomegaly.   Electronically Signed   By: Genia Del M.D.   On: 08/05/2014 07:58   Dg Chest Port 1 View  08/02/2014   CLINICAL DATA:  Shortness of breath and fever since Thursday, RIGHT axillary pain, pain shooting through RIGHT shoulder, history asthma and LEFT breast cancer  EXAM: PORTABLE CHEST - 1 VIEW  COMPARISON:  Portable exam 1420 hr compared to 10/17/2012  FINDINGS: Normal heart size, mediastinal contours and pulmonary vascularity for technique.  Atherosclerotic calcification aortic arch.  New RIGHT middle lobe consolidation consistent with pneumonia.  Probable additional infiltrate in RIGHT upper and RIGHT lower lobes.  LEFT lung grossly clear.  Central peribronchial thickening present.  No gross pleural effusion or pneumothorax.  Bones demineralized.  IMPRESSION: RIGHT middle lobe consolidation consistent with pneumonia, with probable mild additional infiltrate throughout remainder of RIGHT lung.  Followup PA and lateral chest X-ray is recommended in 3-4 weeks following trial of antibiotic therapy to ensure resolution and exclude underlying malignancy.   Electronically Signed   By: Lavonia Dana M.D.   On: 08/02/2014 14:32   2-D echocardiogram 08/03/14  Study Conclusions  - Left ventricle: The cavity size was normal. Systolic function was normal. The estimated ejection fraction was in the range of 50% to 55%. - Mitral valve: There was mild regurgitation.  Labs:   Basic Metabolic Panel:  Recent Labs Lab 08/08/14 0330 08/09/14 0430 08/10/14 0440 08/10/14 0905 08/11/14 0423 08/12/14 0400 08/13/14 0530  NA 142 141  --  140  140 139 138  K 3.8 4.2  --  4.3 4.1 3.4* 3.4*  CL 97* 100*  --  104 105 104 105  CO2 35* 32  --  27 26 25 26   GLUCOSE 128* 124*  --  150* 120* 124* 93  BUN 24* 30*  --  26* 27* 24* 21*  CREATININE 0.48 0.52  --  0.55 0.61 0.64 0.57  CALCIUM 8.3* 8.3*  --  8.5* 8.3* 8.4* 8.7*  MG 2.2  --  2.2  --  2.3 2.2 2.2  PHOS  --   --  4.0  --  4.5 3.2 3.2   GFR Estimated Creatinine Clearance: 49 mL/min (by C-G formula based on Cr of 0.57). Liver Function Tests:  Recent Labs Lab 08/09/14 0430  AST 37  ALT 38  ALKPHOS 87  BILITOT 0.6  PROT 6.0*  ALBUMIN 2.2*   CBC:  Recent Labs Lab 08/09/14 0430 08/10/14 0905 08/11/14 0423 08/12/14 0400 08/13/14 0530  WBC 33.5* 35.1* 28.5* 31.3* 31.5*  HGB 10.4* 10.7* 10.1* 10.4* 10.3*  HCT 32.8* 34.3* 31.5* 32.7* 32.9*  MCV 94.3 93.7 93.5 94.2 93.7  PLT 333 387 362 408* 418*   CBG:  Recent Labs Lab 08/07/14 1211  GLUCAP 145*   Microbiology Recent Results (from the past 240 hour(s))  Body fluid culture     Status: None   Collection Time: 08/08/14 11:12 AM  Result Value Ref Range Status   Specimen Description PLEURAL  Final   Special Requests NONE  Final  Gram Stain   Final    MODERATE WBC PRESENT, PREDOMINANTLY PMN NO ORGANISMS SEEN Performed at [facility]    Culture   Final    NO GROWTH 3 DAYS Performed at [facility]    Report Status 08/11/2014 FINAL  Final     Discharge Instructions:   Discharge Instructions    Call MD for:  difficulty breathing, headache or visual disturbances    Complete by:  As directed      Call MD for:  extreme fatigue    Complete by:  As directed      Call MD for:  temperature >100.4    Complete by:  As directed      Diet - low sodium heart healthy    Complete by:  As directed      Discharge instructions    Complete by:  As directed   You were cared for by Dr. Jacquelynn Cree  (a hospitalist) during your hospital stay. If you have any questions about your discharge  medications or the care you received while you were in the hospital after you are discharged, you can call the unit and ask to speak with the hospitalist on call if the hospitalist that took care of you is not available. Once you are discharged, your primary care physician will handle any further medical issues. Please note that NO REFILLS for any discharge medications will be authorized once you are discharged, as it is imperative that you return to your primary care physician (or establish a relationship with a primary care physician if you do not have one) for your aftercare needs so that they can reassess your need for medications and monitor your lab values.  Any outstanding tests can be reviewed by your PCP at your follow up visit.  It is also important to review any medicine changes with your PCP.  Please bring these d/c instructions with you to your next visit so your physician can review these changes with you.  If you do not have a primary care physician, you can call [phone] for a physician referral.  It is highly recommended that you obtain a PCP for hospital follow up.     Face-to-face encounter (required for Medicare/Medicaid patients)    Complete by:  As directed   I RAMA,CHRISTINA certify that this patient is under my care and that I, or a nurse practitioner or physician's assistant working with me, had a face-to-face encounter that meets the physician face-to-face encounter requirements with this patient on 08/13/2014. The encounter with the patient was in whole, or in part for the following medical condition(s) which is the primary reason for home health care (List medical condition): Home health RN to assure transition home monitor VS and activity tolerance, teach disease process.  PT to work to increase activity tolerance.  The encounter with the patient was in whole, or in part, for the following medical condition, which is the primary reason for home health care:  Cavitary pneumonia,  deconditioning  I certify that, based on my findings, the following services are medically necessary home health services:  Nursing  Reason for Medically Necessary Home Health Services:   Skilled Nursing- Changes in Medication/Medication Management Skilled Nursing- Skilled Assessment/Observation Therapy- Home Adaptation to Facilitate Safety Therapy- Therapeutic Exercises to Increase Strength and Endurance    My clinical findings support the need for the above services:  Shortness of breath with activity  Further, I certify that my clinical findings support that this patient  is homebound due to:  Shortness of Breath with activity     Home Health    Complete by:  As directed   To provide the following care/treatments:   PT RN       Increase activity slowly    Complete by:  As directed             Medication List    TAKE these medications        albuterol 108 (90 BASE) MCG/ACT inhaler  Commonly known as:  PROAIR HFA  Inhale 2 puffs into the lungs every 6 (six) hours as needed for wheezing.     albuterol (2.5 MG/3ML) 0.083% nebulizer solution  Commonly known as:  PROVENTIL  Take 3 mLs (2.5 mg total) by nebulization every 6 (six) hours as needed. DX 493.90     amoxicillin-clavulanate 1000-62.5 MG per tablet  Commonly known as:  AUGMENTIN XR  Take 2 tablets by mouth every 12 (twelve) hours.     azelastine 0.1 % nasal spray  Commonly known as:  ASTELIN  Place 1 spray into the nose at bedtime. Use in each nostril as directed     budesonide-formoterol 80-4.5 MCG/ACT inhaler  Commonly known as:  SYMBICORT  Inhale 2 puffs into the lungs 2 (two) times daily.     fexofenadine 180 MG tablet  Commonly known as:  ALLEGRA  Take 180 mg by mouth daily with breakfast.     Fish Oil 1200 MG Caps  Take 1,200 mg by mouth at bedtime.     fluticasone 50 MCG/ACT nasal spray  Commonly known as:  FLONASE  Place 1-2 sprays into both nostrils at bedtime.     guaiFENesin-dextromethorphan  100-10 MG/5ML syrup  Commonly known as:  ROBITUSSIN DM  Take 5 mLs by mouth every 4 (four) hours as needed for cough.     meloxicam 15 MG tablet  Commonly known as:  MOBIC  Take 15 mg by mouth daily.     predniSONE 20 MG tablet  Commonly known as:  DELTASONE  Take 2 tablets (40 mg total) by mouth daily with breakfast.     PRESERVISION/LUTEIN Caps  Take 1 capsule by mouth 2 (two) times daily.     MULTI FOR HER 50+ Tabs  Take 1 tablet by mouth daily.     raloxifene 60 MG tablet  Commonly known as:  EVISTA  Take 60 mg by mouth daily with breakfast.     TUMS 500 MG chewable tablet  Generic drug:  calcium carbonate  Chew 1 tablet by mouth at bedtime.     Vitamin D 1000 UNITS capsule  Take 1,000 Units by mouth daily with breakfast.           Follow-up Information    Follow up with SOOD,VINEET, MD On 10/02/2014.   Specialty:  Pulmonary Disease   Why:  10:00am    Contact information:   [address] [address] [phone]       Follow up with SHAW,KIMBERLEE, MD. Schedule an appointment as soon as possible for a visit in 2 weeks.   Specialty:  Family Medicine   Why:  For repeat CBC and follow up for cavitary pneumonia   Contact information:   [address] [place] [address] [phone]        Time coordinating discharge: 35 minutes.  Signed:  RAMA,CHRISTINA  Pager [phone] [facility] 08/13/2014, 6:19 PM

## 2014-08-13 NOTE — Progress Notes (Signed)
Pt ambulated from room 1445 to 1441 and back w/ steady gait and walker. O2 sat 94% on RA while ambulating. No dyspnea noted and pt denied any. Returned to room and sitting up in chair. No c/o.

## 2014-08-13 NOTE — Progress Notes (Signed)
Progress Note   [name] VPX:[id number] DOB: 1940/07/31 DOA: 08/02/2014 PCP: Mayra Neer, MD   Brief Narrative:   [name] is an 74 y.o. female with a PMH of asthma and hypertension admitted on 08/02/14 with several days of progressively worsening shortness of breath along with productive cough with yellowish sputum and subjective fevers and chills. She has been treated for sepsis secondary to community-acquired pneumonia. Although some mild initial improvement, despite IV fluids and antibiotics, patient's white blood cell count progressively worsened and patient became more tachypneic and requiring additional oxygenation. Follow-up chest x-ray noted large pleural effusion and progressive airspace disease. Pulmonary consulted and patient underwent thoracentesis done 08/08/14, removing 1 L of purulent fluid. Since then, patient has had continued improvement. Follow-up CT noted some cavitation.   Assessment/Plan:   Principal Problem:   Sepsis due to community-acquired cavitary pneumonia and empyema - Chest x-ray done on admission showed a right middle lobe pneumonia. Initially treated with 7 days of Rocephin/azithromycin. - Due to progressively rising WBC and clinical deterioration, a F/U CXR was done 08/05/14 showing significant progression of airspace disease. Vancomycin was added to her other antibiotics. - PCCM was consulted 08/06/14 due to respiratory failure requiring BiPAP therapy. - Patient was subsequently started on Pulmicort, bronchodilators, Solu-Medrol, and diuresed with Lasix. - On 08/08/14, a thoracentesis was performed with 1100 mL of cloudy fluid removed. Cultures have remained negative. - CT of the chest, done 08/09/14 showed a thin-walled cavitary lesion with ongoing extensive right-sided pneumonia. - Zosyn was added 08/10/14 and all IV antibiotics were discontinued 08/11/14 the patient was started on oral Augmentin. - Blood cultures have remained negative. - ID  consultation for recommendations regarding duration of antibiotic therapy, further workup, and to determine appropriate outpatient follow-up.  Active Problems:   Hypokalemia secondary to diuretics - Place on supplementation while on diuretic therapy.    Abdominal lymphadenopathy - Mesenteric lymphadenopathy seen on abdominal CT. Follow-up CT recommended in one month. We'll need to rule out lymphoma.    Acute respiratory failure with hypoxia secondary to Acute lung injury and Pleural effusion, right - Required BiPAP and high flow oxygen, which has now been weaned off. - Respiratory status dramatically improved after thoracentesis and with diuresis.    DVT Prophylaxis  Code Status: Full. Family Communication: Multiple family updated at the bedside. Disposition Plan: Home in 24 hours pending ID consultation and recommendations.   IV Access:    PICC placed 08/08/14   Procedures and diagnostic studies:   Ct Abdomen Pelvis Wo Contrast  08/02/2014   CLINICAL DATA:  diarrhea and rt sided abd pain since Friday. States that on Thursday, she slept for 24 hrs and did not eat anything  EXAM: CT ABDOMEN AND PELVIS WITHOUT CONTRAST  TECHNIQUE: Multidetector CT imaging of the abdomen and pelvis was performed following the standard protocol without IV contrast.  COMPARISON:  Radiograph from earlier the same day, and earlier studies  FINDINGS: Airspace consolidation with air bronchograms in the visualized portions of right middle lobe. Some patchy airspace opacities in the anterior and posterior basal segments right lower lobe. Small right pleural effusion. Bilateral breast prostheses are partially visualized. Patchy coronary calcifications.  Mild diffuse fatty infiltration of the liver. There is an 11 mm low-attenuation subcapsular hepatic segment 6 lesion, similar 15 mm segment 4A lesion, and a subcapsular 16 mm segment 2 lesion showing peripheral calcifications. Multiple partially calcified gallstones in  the nondilated gallbladder. Unremarkable kidneys, adrenal glands, pancreas, and spleen.  Unenhanced CT was performed per clinician order. Lack of IV contrast limits sensitivity and specificity, especially for evaluation of abdominal/pelvic solid viscera.  An increased number of central mesenteric lymph nodes, none greater than 1 cm short axis diameter. Mild inflammatory/edematous changes centrally in the surrounding mesentery. No retroperitoneal or pelvic adenopathy.  Stomach, small bowel, and colon are nondilated. Urinary bladder is nondistended. Previous hysterectomy. No ascites. No free air. Minimal spondylitic changes in the lower lumbar spine.  IMPRESSION: 1. Right middle and lower lobe pneumonia with small right pleural effusion. 2. At least 3 nonspecific hepatic lesions. Consider elective outpatient MR liver with contrast for further characterization, to differentiate benign from malignant etiologies. 3. 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 Reactive mesenteric adenitis and lymphoma would be the primary considerations. 4. Cholelithiasis   Electronically Signed   By: Lucrezia Europe M.D.   On: 08/02/2014 14:52   Ct Chest Wo Contrast  08/09/2014   CLINICAL DATA:  Progressive worsening shortness of breath. Productive cough with subjective fever and chills. Elevated white blood cell count.  EXAM: CT CHEST WITHOUT CONTRAST  TECHNIQUE: Multidetector CT imaging of the chest was performed following the standard protocol without IV contrast.  COMPARISON:  Chest radiograph 08/09/2014 CT abdomen 08/02/2014, CT chest 05/02/2007  FINDINGS: Mediastinum/Nodes: No axillary supraclavicular lymphadenopathy. Right PICC line noted. No mediastinal or hilar lymphadenopathy. Trace pericardial effusion. Esophagus is normal.  Lungs/Pleura: There is some improvement in the dense right lower lobe consolidation seen on radiograph CT of 08/02/2014. There is persistent consolidation in the right lower  lobe anteriorly. There is a small focus of thin walled cavitation anterior right lower lobe measuring 3 cm (image 37, series 9) which is increased from 2 cm on prior. There is a small right pleural effusion. There is interlobular septal thickening in both lungs. There is patchy ground-glass opacities in both lungs.  Upper abdomen: Diffuse low-attenuation within liver. There is a calcified lesion left hepatic lobe unchanged in size from 2009. The peripheral calcification is noted.  Musculoskeletal: Bilateral breast prosthetic. No aggressive osseous lesion.  IMPRESSION: 1. Improvement in dense consolidation in the right lower lobe compared to CT of 08/02/2014 is consistent with improving pneumonia. 2. There is a new thin-walled cavitary lesion in the anterior right lower lobe adjacent to the consolidation which is increased in size from prior and is likely post infectious. No clear evidence of superinfection of this thin-walled cavitary lesion. 3. Scattered ground-glass opacities and interlobular septal is increased from 08/01/2004 and likely represents mild pulmonary edema and interstitial edema. Multifocal infection is felt less likely. 4. Hepatic steatosis.   Electronically Signed   By: Suzy Bouchard M.D.   On: 08/09/2014 14:31   Dg Chest Port 1 View  08/09/2014   CLINICAL DATA:  Pneumonia  EXAM: PORTABLE CHEST - 1 VIEW  COMPARISON:  Yesterday  FINDINGS: Right PICC placed. Tip is at the cavoatrial junction. Low lung volumes. Hazy bilateral airspace disease increased. Upper normal heart size. No pneumothorax.  IMPRESSION: Right PICC placed.  Tip is at the cavoatrial junction.  Worsening bilateral airspace disease.   Electronically Signed   By: Marybelle Killings M.D.   On: 08/09/2014 08:13   Dg Chest Port 1 View  08/08/2014   CLINICAL DATA:  Right-sided thoracentesis.  EXAM: PORTABLE CHEST - 1 VIEW  COMPARISON:  One-view chest 08/08/2014.  FINDINGS: The heart size normal. Bilateral interstitial and airspace  disease is similar to the prior exam. There is a significant reduction in the right  pleural effusion. No pneumothorax is present. The visualized soft tissues and bony thorax are otherwise unremarkable.  IMPRESSION: 1. Interval decrease in right pleural effusion without evidence for pneumothorax. 2. Stable bilateral interstitial airspace disease concerning for pneumonia and edema.   Electronically Signed   By: San Morelle M.D.   On: 08/08/2014 12:01   Dg Chest Port 1 View  08/08/2014   CLINICAL DATA:  Pneumonia.  EXAM: PORTABLE CHEST - 1 VIEW  COMPARISON:  08/06/2014.  FINDINGS: Mediastinum and hilar structures normal. Heart size stable. Pulmonary vascularity normal . Diffuse bilateral airspace disease noted. Stable right pleural effusion. No pneumothorax. No acute bony abnormality.  IMPRESSION: 1. Diffuse bilateral airspace disease, no interim change . 2. Persistent small right pleural effusion.   Electronically Signed   By: Marcello Moores  Register   On: 08/08/2014 07:04   Dg Chest Port 1 View  08/06/2014   CLINICAL DATA:  74 year old female with a history of shortness of breath  EXAM: PORTABLE CHEST - 1 VIEW  COMPARISON:  Chest x-ray 08/05/2014, 08/02/2014, 10/17/2012. Abdominal CT 08/02/2014  FINDINGS: Cardiomediastinal silhouette likely unchanged, partially obscured by overlying lung/ pleural disease.  Atherosclerotic calcifications.  Persisting bilateral, right greater than left airspace disease. No pneumothorax.  Likely right-sided pleural effusion.  Surgical changes of breast prosthesis.  No displaced fracture.  IMPRESSION: Persisting bilateral airspace disease, worst on the right.  Right-sided pleural effusion.  Atherosclerosis.  Signed,  Dulcy Fanny. Earleen Newport, DO  [facility]  [facility]   Electronically Signed   By: Corrie Mckusick D.O.   On: 08/06/2014 13:10   Dg Chest Port 1 View  08/05/2014   CLINICAL DATA:  74 year old female with shortness breath.  History of asthma and breast cancer. Subsequent encounter.  EXAM: PORTABLE CHEST - 1 VIEW  COMPARISON:  08/02/2014  FINDINGS: Significant progression of asymmetric airspace disease. This may represent multi focal infectious infiltrates with pulmonary edema. Right-sided pleural effusion suspected. Follow-up until clearance recommended to exclude underlying malignancy.  No gross pneumothorax.  Calcified aorta.  Cardiomegaly.  IMPRESSION: Significant progression of asymmetric airspace disease. This may represent multi focal infectious infiltrates with pulmonary edema. Right-sided pleural effusion suspected. Follow-up until clearance recommended to exclude underlying malignancy.  Calcified slightly tortuous aorta.  Cardiomegaly.   Electronically Signed   By: Genia Del M.D.   On: 08/05/2014 07:58   Dg Chest Port 1 View  08/02/2014   CLINICAL DATA:  Shortness of breath and fever since Thursday, RIGHT axillary pain, pain shooting through RIGHT shoulder, history asthma and LEFT breast cancer  EXAM: PORTABLE CHEST - 1 VIEW  COMPARISON:  Portable exam 1420 hr compared to 10/17/2012  FINDINGS: Normal heart size, mediastinal contours and pulmonary vascularity for technique.  Atherosclerotic calcification aortic arch.  New RIGHT middle lobe consolidation consistent with pneumonia.  Probable additional infiltrate in RIGHT upper and RIGHT lower lobes.  LEFT lung grossly clear.  Central peribronchial thickening present.  No gross pleural effusion or pneumothorax.  Bones demineralized.  IMPRESSION: RIGHT middle lobe consolidation consistent with pneumonia, with probable mild additional infiltrate throughout remainder of RIGHT lung.  Followup PA and lateral chest X-ray is recommended in 3-4 weeks following trial of antibiotic therapy to ensure resolution and exclude underlying malignancy.   Electronically Signed   By: Lavonia Dana M.D.   On: 08/02/2014 14:32     Medical Consultants:    PCCM  ID  Anti-Infectives:     IV Rocephin 5/7-5/14  IV Zithromax 5/7-5/14  IV  vancomycin 5/10-5/16  IV Zosyn 5/15-5/16  By mouth ampicillin 5/16-present  Subjective:   [name] is still having some right lower lobe pain/discomfort, but reports that she is not dyspneic and that the oxygen has now been weaned off.  Objective:    Filed Vitals:   08/13/14 0719 08/13/14 0755 08/13/14 0803 08/13/14 0932  BP:    113/56  Pulse:    107  Temp:      TempSrc:      Resp:    20  Height:      Weight:      SpO2: 95% 94% 94% 96%    Intake/Output Summary (Last 24 hours) at 08/13/14 1023 Last data filed at 08/13/14 0825  Gross per 24 hour  Intake    600 ml  Output    950 ml  Net   -350 ml    Exam: Gen:  NAD Cardiovascular:  Mildly tachycardia, No M/R/G Respiratory:  Lungs diminished with a few right base crackles Gastrointestinal:  Abdomen soft, NT/ND, + BS Extremities:  No C/E/C   Data Reviewed:    Labs: Basic Metabolic Panel:  Recent Labs Lab 08/08/14 0330 08/09/14 0430 08/10/14 0440 08/10/14 0905 08/11/14 0423 08/12/14 0400 08/13/14 0530  NA 142 141  --  140 140 139 138  K 3.8 4.2  --  4.3 4.1 3.4* 3.4*  CL 97* 100*  --  104 105 104 105  CO2 35* 32  --  27 26 25 26   GLUCOSE 128* 124*  --  150* 120* 124* 93  BUN 24* 30*  --  26* 27* 24* 21*  CREATININE 0.48 0.52  --  0.55 0.61 0.64 0.57  CALCIUM 8.3* 8.3*  --  8.5* 8.3* 8.4* 8.7*  MG 2.2  --  2.2  --  2.3 2.2 2.2  PHOS  --   --  4.0  --  4.5 3.2 3.2   GFR Estimated Creatinine Clearance: 49 mL/min (by C-G formula based on Cr of 0.57). Liver Function Tests:  Recent Labs Lab 08/09/14 0430  AST 37  ALT 38  ALKPHOS 87  BILITOT 0.6  PROT 6.0*  ALBUMIN 2.2*   CBC:  Recent Labs Lab 08/09/14 0430 08/10/14 0905 08/11/14 0423 08/12/14 0400 08/13/14 0530  WBC 33.5* 35.1* 28.5* 31.3* 31.5*  HGB 10.4* 10.7* 10.1* 10.4* 10.3*  HCT 32.8* 34.3* 31.5* 32.7* 32.9*  MCV 94.3 93.7 93.5 94.2 93.7  PLT 333 387 362 408* 418*     Recent Labs Lab 08/07/14 1211  GLUCAP 145*   Sepsis Labs:  Recent Labs Lab 08/06/14 2337  08/07/14 0335 08/08/14 0330  08/10/14 0905 08/11/14 0423 08/12/14 0400 08/13/14 0530  PROCALCITON 3.30  --  3.04 1.56  --   --   --   --   --   WBC  --   < > 43.1* 40.2*  < > 35.1* 28.5* 31.3* 31.5*  < > = values in this interval not displayed. Microbiology Recent Results (from the past 240 hour(s))  Body fluid culture     Status: None   Collection Time: 08/08/14 11:12 AM  Result Value Ref Range Status   Specimen Description PLEURAL  Final   Special Requests NONE  Final   Gram Stain   Final    MODERATE WBC PRESENT, PREDOMINANTLY PMN NO ORGANISMS SEEN Performed at [facility]    Culture   Final    NO GROWTH 3 DAYS Performed at [facility]  Report Status 08/11/2014 FINAL  Final     Medications:   . amoxicillin-clavulanate  2 tablet Oral Q12H  . antiseptic oral rinse  7 mL Mouth Rinse q12n4p  . azelastine  1 spray Each Nare QHS  . budesonide (PULMICORT) nebulizer solution  0.5 mg Nebulization BID  . enoxaparin (LOVENOX) injection  40 mg Subcutaneous Q24H  . fluticasone  1-2 spray Each Nare QHS  . furosemide  40 mg Oral Daily  . ipratropium-albuterol  3 mL Nebulization Q6H  . LORazepam  0.5 mg Intravenous Once  . predniSONE  40 mg Oral Q breakfast  . raloxifene  60 mg Oral Q breakfast   Continuous Infusions: . sodium chloride 10 mL/hr at 08/08/14 1100    Time spent: 25 minutes.   LOS: 11 days   [facility] Pager [phone]. If unable to reach me by pager, please call my cell phone at [phone].  *Please refer to [email or web address], password [email or web address] to get updated schedule on who will round on this patient, as hospitalists switch teams weekly. If 7PM-7AM, please contact night-coverage at [email or web address], password [email or web address] for any overnight needs.  08/13/2014, 10:23 AM

## 2014-08-13 NOTE — Progress Notes (Signed)
D/C instructions reviewed w/ pt and husband, all questions answered. Pt and husband verbalize understanding. Pt d/c in w/c in stable condition by NT to husband's car. Pt in possession of d/c instructions and all personal belongings. Pt aware of need to p/u scripts at pharmacy on way home.

## 2014-08-13 NOTE — Progress Notes (Signed)
Spoke with pt and husband at bedside concerning [place] needs. Pt had no preference to [facility]. [facility] was selected and referral given.

## 2014-08-13 NOTE — Progress Notes (Signed)
Name: [name] MRN: [id number] DOB: 1941-02-03    ADMISSION DATE:  08/02/2014 CONSULTATION DATE:  08/06/2014  REFERRING MD :  Dr. Maryland Pink  CHIEF COMPLAINT:  SOB  BRIEF PATIENT DESCRIPTION: 74 year old female admitted 5/7 with sepsis and hypoxic respiratory failure (PO2 54 mmHg on room air w/ sats 88%) both secondary to PNA. She was started on empiric ABX for CAP, but did not improve as anticipated. She had progressive hypoxia and SOB eventually requiring BiPAP 5/10. 5/11 hypoxia remained and PCCM was consulted.   SIGNIFICANT EVENTS /studies -CXR 10/18/11>>Chronic scarring and volume loss in the right middle lobe and lingula similar to prior examinations -Exhaled nitric oxide 12/07/11>>8 ppb (normal) -Lt heart cath 01/24/12 >> Normal coronaries, LVEDP 8 mmHg, EF 55% -PFT 12/05/12 >> FEV1 1.49 (82%), FEV1% 69, FEF 25-75% 1.09 (69%), TLC 4.38 (85%), DLCO 87%, borderline BD response ..................................... 5/7 admitted for CAP 5/10 hypoxemia worsened: FIO2 41mmHg on 100% NRB.  BiPAP started 5/11 persistent hypoxemia, PCCM consulted -5/7 CT abd > Right middle and lower lobe pneumonia with small right pleural effusion. At least 3 nonspecific hepatic lesions. 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 Cholelithiasis -5/8 Echo > LVEF 50-55%, mild MR 08/08/14: 1100 mL [place] thora on right side 08/09/2014: RN report improved from face mask to 15L O2. Walked with PT. Looking better. THora rsult reviewed  Right now  frank exudate with LDH 6800 with 16K wbc and 86% PMN  5/15 increased WOB, better after lasix 5/16: looks fantastic, converted to oral abx, pred, working on evaluating home O2 needs.   VITAL SIGNS: Temp:  [97.8 F (36.6 C)-98.8 F (37.1 C)] 98.2 F (36.8 C) (05/18 0402) Pulse Rate:  [79-107] 107 (05/18 0932) Resp:  [18-20] 20 (05/18 0932) BP: (113-126)/(46-56) 113/56 mmHg (05/18 0932) SpO2:  [92 %-97 %] 94 % (05/18 1154) Weight:   [59.3 kg (130 lb 11.7 oz)] 59.3 kg (130 lb 11.7 oz) (05/18 0449)  Intake/Output Summary (Last 24 hours) at 08/13/14 1415 Last data filed at 08/13/14 1200  Gross per 24 hour  Intake    720 ml  Output    650 ml  Net     70 ml    PHYSICAL EXAMINATION: General:  Elderly female on RA w/out distress Neuro:  Alert, oriented x 3. Moves all 4. Speech normal HEENT:  [place]/AT, no JVD, PERRL , O2 on. Neck supple Cardiovascular:  Tachy, regular Lungs:  Basilar rales, w/out accessory muscle use  Musculoskeletal:  No acute deformity or ROM limitaiton.  Skin:  Grossly intact   PULMONARY  Recent Labs Lab 08/06/14 2212 08/07/14 0856 08/08/14 0857  PHART 7.585* 7.586* 7.537*  PCO2ART 39.8 36.6 37.8  PO2ART 67.4* 82.4 66.2*  HCO3 37.9* 34.9* 32.0*  TCO2 33.2 30.5 28.3  O2SAT 94.8 96.8 94.0   CBC  Recent Labs Lab 08/11/14 0423 08/12/14 0400 08/13/14 0530  HGB 10.1* 10.4* 10.3*  HCT 31.5* 32.7* 32.9*  WBC 28.5* 31.3* 31.5*  PLT 362 408* 418*    COAGULATION No results for input(s): INR in the last 168 hours.  CARDIAC   No results for input(s): TROPONINI in the last 168 hours. No results for input(s): PROBNP in the last 168 hours. CHEMISTRY  Recent Labs Lab 08/08/14 0330 08/09/14 0430 08/10/14 0440 08/10/14 0905 08/11/14 0423 08/12/14 0400 08/13/14 0530  NA 142 141  --  140 140 139 138  K 3.8 4.2  --  4.3 4.1 3.4* 3.4*  CL 97* 100*  --  104 105 104 105  CO2 35* 32  --  27 26 25 26   GLUCOSE 128* 124*  --  150* 120* 124* 93  BUN 24* 30*  --  26* 27* 24* 21*  CREATININE 0.48 0.52  --  0.55 0.61 0.64 0.57  CALCIUM 8.3* 8.3*  --  8.5* 8.3* 8.4* 8.7*  MG 2.2  --  2.2  --  2.3 2.2 2.2  PHOS  --   --  4.0  --  4.5 3.2 3.2   Estimated Creatinine Clearance: 49 mL/min (by C-G formula based on Cr of 0.57).  LIVER  Recent Labs Lab 08/09/14 0430  AST 37  ALT 38  ALKPHOS 87  BILITOT 0.6  PROT 6.0*  ALBUMIN 2.2*   INFECTIOUS  Recent Labs Lab 08/06/14 2337  08/07/14 0335 08/08/14 0330  PROCALCITON 3.30 3.04 1.56    ENDOCRINE CBG (last 3)  No results for input(s): GLUCAP in the last 72 hours.  I reviewed CT chest from 5/14  ASSESSMENT / PLAN:   Acute hypoxemic respiratory failure with ALI/CAP(NOS) presentation on 08/02/2014 Rt effusion/empyema 08/08/14 - following failure to improve (s/p 1.1L thora with exudative effusion with LDH 6800).  Improved pleural space, some residual infiltrate and a R small cavity on CT chest from 5/14    PLAN - note O2 weaned to off, goal home on RA - would taper prednisone over the next week to zero - Continue Augmentin. Would complete 21 days abx total.  - will arrange for f/u w Dr Halford Chessman as an outpt > October 03, 2014 at 10:00am - repeat CXR and possibly repeat CT scan at that time depending on Dr Juanetta Gosling evaulation   Asthma without acute exacerbation - would go back to her symbicort, albuterol prn, flonase, allegra and astelin in prep for d/c  PCCM will sign off. Please call if we can help in any way   Baltazar Apo, MD, PhD 08/13/2014, 2:23 PM [facility] [phone] or if no answer [phone]

## 2014-08-13 NOTE — Consult Note (Signed)
[facility]     Reason for Consult: CAP, cavitary lesion    Referring Physician: Dr. Rockne Menghini  Principal Problem:   Sepsis due to pneumonia Active Problems:   Abdominal lymphadenopathy   Acute respiratory failure with hypoxia   CAP (community acquired pneumonia)   Acute lung injury   Pleural effusion, right   Empyema   Acute respiratory failure   Hypokalemia   . amoxicillin-clavulanate  2 tablet Oral Q12H  . antiseptic oral rinse  7 mL Mouth Rinse q12n4p  . azelastine  1 spray Each Nare QHS  . budesonide (PULMICORT) nebulizer solution  0.5 mg Nebulization BID  . enoxaparin (LOVENOX) injection  40 mg Subcutaneous Q24H  . fluticasone  1-2 spray Each Nare QHS  . furosemide  40 mg Oral Daily  . ipratropium-albuterol  3 mL Nebulization Q6H  . LORazepam  0.5 mg Intravenous Once  . potassium chloride  20 mEq Oral BID  . predniSONE  40 mg Oral Q breakfast  . 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  Better on antibiotics and steroids.   I suspect leukocytosis is multifactorial due to infection, steroids, leukemoid reaction.    Antibiotics: augmentin Previous vancomycin, ceftriaxone, azithromycin.  HPI: [name] is a 74 y.o. female with asthma, followed by Dr. Halford Chessman, came to ED 5/7 with worsening dypsnea at rest.  Found to have CAP and developed empyema, now with area of abscess.  Initial WBC of 20 and has been persistently elevated.  Underwent thoracentesis 5/13.  Was on bipap at one point.  Now on RA.  Feels much better.  Has been on CAP tx with vancomycin, now on augementin and continues to clinically feel well.  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  WBC has been persistently elevated and is 31 now.  C diff has  been checked and was negative.  No significant diarrhea now.     Review of Systems: A comprehensive review of systems was negative.  Past Medical History  Diagnosis Date  . Asthma   . Macular degeneration   . Hearing loss     both ears, wwears hearing aides  . Osteopenia   . Arthritis   . Cancer of breast     33 years ago, left,     History  Substance Use Topics  . Smoking status: Never Smoker   . Smokeless tobacco: Never Used  . Alcohol Use: Yes     Comment: glass wine 1x month    Family History  Problem Relation Age of Onset  . Heart attack Father    Allergies  Allergen Reactions  . Fosamax [Alendronate Sodium] Nausea Only  . Sulfonamide Derivatives Rash    OBJECTIVE: Blood pressure 109/51, pulse 96, temperature 98 F (36.7 C), temperature source Oral, resp. rate 20, height 4\' 11"  (1.499 m), weight 130 lb 11.7 oz (59.3 kg), SpO2 96 %. General: awake, alert, nad Skin: no rashes Lungs: CTA B Cor: RRR Abdomen: soft, nt, nd Ext: no edema  Microbiology: Recent Results (from the past 240 hour(s))  Body fluid culture     Status: None   Collection Time: 08/08/14 11:12 AM  Result Value Ref Range Status   Specimen Description PLEURAL  Final  Special Requests NONE  Final   Gram Stain   Final    MODERATE WBC PRESENT, PREDOMINANTLY PMN NO ORGANISMS SEEN Performed at [facility]    Culture   Final    NO GROWTH 3 DAYS Performed at [facility]    Report Status 08/11/2014 FINAL  Final    Scharlene Gloss, [facility] [email or web address] [phone] pager  [phone] cell 08/13/2014, 2:54 PM

## 2014-08-14 DIAGNOSIS — R197 Diarrhea, unspecified: Secondary | ICD-10-CM | POA: Diagnosis not present

## 2014-08-14 DIAGNOSIS — J189 Pneumonia, unspecified organism: Secondary | ICD-10-CM | POA: Diagnosis not present

## 2014-08-14 DIAGNOSIS — A419 Sepsis, unspecified organism: Secondary | ICD-10-CM | POA: Diagnosis not present

## 2014-08-14 DIAGNOSIS — R590 Localized enlarged lymph nodes: Secondary | ICD-10-CM | POA: Diagnosis not present

## 2014-08-14 DIAGNOSIS — J439 Emphysema, unspecified: Secondary | ICD-10-CM | POA: Diagnosis not present

## 2014-08-15 DIAGNOSIS — R197 Diarrhea, unspecified: Secondary | ICD-10-CM | POA: Diagnosis not present

## 2014-08-16 DIAGNOSIS — J189 Pneumonia, unspecified organism: Secondary | ICD-10-CM | POA: Diagnosis not present

## 2014-08-16 DIAGNOSIS — J439 Emphysema, unspecified: Secondary | ICD-10-CM | POA: Diagnosis not present

## 2014-08-16 DIAGNOSIS — R590 Localized enlarged lymph nodes: Secondary | ICD-10-CM | POA: Diagnosis not present

## 2014-08-16 DIAGNOSIS — A419 Sepsis, unspecified organism: Secondary | ICD-10-CM | POA: Diagnosis not present

## 2014-08-16 DIAGNOSIS — R197 Diarrhea, unspecified: Secondary | ICD-10-CM | POA: Diagnosis not present

## 2014-08-18 DIAGNOSIS — R197 Diarrhea, unspecified: Secondary | ICD-10-CM | POA: Diagnosis not present

## 2014-08-18 DIAGNOSIS — R590 Localized enlarged lymph nodes: Secondary | ICD-10-CM | POA: Diagnosis not present

## 2014-08-18 DIAGNOSIS — J439 Emphysema, unspecified: Secondary | ICD-10-CM | POA: Diagnosis not present

## 2014-08-18 DIAGNOSIS — A419 Sepsis, unspecified organism: Secondary | ICD-10-CM | POA: Diagnosis not present

## 2014-08-18 DIAGNOSIS — J189 Pneumonia, unspecified organism: Secondary | ICD-10-CM | POA: Diagnosis not present

## 2014-08-20 DIAGNOSIS — J189 Pneumonia, unspecified organism: Secondary | ICD-10-CM | POA: Diagnosis not present

## 2014-08-20 DIAGNOSIS — J439 Emphysema, unspecified: Secondary | ICD-10-CM | POA: Diagnosis not present

## 2014-08-20 DIAGNOSIS — A419 Sepsis, unspecified organism: Secondary | ICD-10-CM | POA: Diagnosis not present

## 2014-08-20 DIAGNOSIS — R197 Diarrhea, unspecified: Secondary | ICD-10-CM | POA: Diagnosis not present

## 2014-08-20 DIAGNOSIS — R590 Localized enlarged lymph nodes: Secondary | ICD-10-CM | POA: Diagnosis not present

## 2014-08-22 DIAGNOSIS — J439 Emphysema, unspecified: Secondary | ICD-10-CM | POA: Diagnosis not present

## 2014-08-22 DIAGNOSIS — R590 Localized enlarged lymph nodes: Secondary | ICD-10-CM | POA: Diagnosis not present

## 2014-08-22 DIAGNOSIS — A419 Sepsis, unspecified organism: Secondary | ICD-10-CM | POA: Diagnosis not present

## 2014-08-22 DIAGNOSIS — R197 Diarrhea, unspecified: Secondary | ICD-10-CM | POA: Diagnosis not present

## 2014-08-22 DIAGNOSIS — J189 Pneumonia, unspecified organism: Secondary | ICD-10-CM | POA: Diagnosis not present

## 2014-08-27 ENCOUNTER — Ambulatory Visit
Admission: RE | Admit: 2014-08-27 | Discharge: 2014-08-27 | Disposition: A | Discharge status: Home / Self Care | Payer: Commercial Managed Care - HMO | Source: Ambulatory Visit | Attending: Family Medicine | Admitting: Family Medicine

## 2014-08-27 ENCOUNTER — Encounter (HOSPITAL_COMMUNITY): Payer: Self-pay | Admitting: Emergency Medicine

## 2014-08-27 ENCOUNTER — Other Ambulatory Visit: Payer: Self-pay | Admitting: Family Medicine

## 2014-08-27 ENCOUNTER — Inpatient Hospital Stay (HOSPITAL_COMMUNITY)
Admission: EM | Admit: 2014-08-27 | Discharge: 2014-09-01 | DRG: 871 | Disposition: A | Discharge status: Home Health | Payer: Commercial Managed Care - HMO | Attending: Family Medicine | Admitting: Family Medicine

## 2014-08-27 DIAGNOSIS — Z8249 Family history of ischemic heart disease and other diseases of the circulatory system: Secondary | ICD-10-CM

## 2014-08-27 DIAGNOSIS — Z853 Personal history of malignant neoplasm of breast: Secondary | ICD-10-CM

## 2014-08-27 DIAGNOSIS — H3530 Unspecified macular degeneration: Secondary | ICD-10-CM | POA: Diagnosis present

## 2014-08-27 DIAGNOSIS — J984 Other disorders of lung: Secondary | ICD-10-CM | POA: Diagnosis not present

## 2014-08-27 DIAGNOSIS — J948 Other specified pleural conditions: Secondary | ICD-10-CM | POA: Diagnosis not present

## 2014-08-27 DIAGNOSIS — D72829 Elevated white blood cell count, unspecified: Secondary | ICD-10-CM | POA: Diagnosis not present

## 2014-08-27 DIAGNOSIS — J9691 Respiratory failure, unspecified with hypoxia: Secondary | ICD-10-CM | POA: Diagnosis not present

## 2014-08-27 DIAGNOSIS — Z9010 Acquired absence of unspecified breast and nipple: Secondary | ICD-10-CM | POA: Diagnosis present

## 2014-08-27 DIAGNOSIS — J9601 Acute respiratory failure with hypoxia: Secondary | ICD-10-CM | POA: Diagnosis present

## 2014-08-27 DIAGNOSIS — J869 Pyothorax without fistula: Secondary | ICD-10-CM | POA: Diagnosis not present

## 2014-08-27 DIAGNOSIS — Z8701 Personal history of pneumonia (recurrent): Secondary | ICD-10-CM | POA: Diagnosis not present

## 2014-08-27 DIAGNOSIS — M1990 Unspecified osteoarthritis, unspecified site: Secondary | ICD-10-CM | POA: Diagnosis present

## 2014-08-27 DIAGNOSIS — R590 Localized enlarged lymph nodes: Secondary | ICD-10-CM | POA: Diagnosis present

## 2014-08-27 DIAGNOSIS — A419 Sepsis, unspecified organism: Secondary | ICD-10-CM | POA: Diagnosis not present

## 2014-08-27 DIAGNOSIS — K529 Noninfective gastroenteritis and colitis, unspecified: Secondary | ICD-10-CM | POA: Diagnosis not present

## 2014-08-27 DIAGNOSIS — Z79899 Other long term (current) drug therapy: Secondary | ICD-10-CM

## 2014-08-27 DIAGNOSIS — R740 Nonspecific elevation of levels of transaminase and lactic acid dehydrogenase [LDH]: Secondary | ICD-10-CM | POA: Diagnosis present

## 2014-08-27 DIAGNOSIS — Z888 Allergy status to other drugs, medicaments and biological substances status: Secondary | ICD-10-CM | POA: Diagnosis not present

## 2014-08-27 DIAGNOSIS — K521 Toxic gastroenteritis and colitis: Secondary | ICD-10-CM | POA: Diagnosis not present

## 2014-08-27 DIAGNOSIS — J189 Pneumonia, unspecified organism: Secondary | ICD-10-CM

## 2014-08-27 DIAGNOSIS — D638 Anemia in other chronic diseases classified elsewhere: Secondary | ICD-10-CM | POA: Diagnosis present

## 2014-08-27 DIAGNOSIS — M8580 Other specified disorders of bone density and structure, unspecified site: Secondary | ICD-10-CM | POA: Diagnosis present

## 2014-08-27 DIAGNOSIS — R531 Weakness: Secondary | ICD-10-CM | POA: Diagnosis present

## 2014-08-27 DIAGNOSIS — T3695XA Adverse effect of unspecified systemic antibiotic, initial encounter: Secondary | ICD-10-CM | POA: Diagnosis not present

## 2014-08-27 DIAGNOSIS — R197 Diarrhea, unspecified: Secondary | ICD-10-CM | POA: Diagnosis not present

## 2014-08-27 DIAGNOSIS — J45909 Unspecified asthma, uncomplicated: Secondary | ICD-10-CM | POA: Diagnosis present

## 2014-08-27 DIAGNOSIS — D473 Essential (hemorrhagic) thrombocythemia: Secondary | ICD-10-CM | POA: Diagnosis present

## 2014-08-27 DIAGNOSIS — E440 Moderate protein-calorie malnutrition: Secondary | ICD-10-CM | POA: Diagnosis not present

## 2014-08-27 DIAGNOSIS — Z9889 Other specified postprocedural states: Secondary | ICD-10-CM

## 2014-08-27 DIAGNOSIS — M7989 Other specified soft tissue disorders: Secondary | ICD-10-CM | POA: Diagnosis not present

## 2014-08-27 DIAGNOSIS — E869 Volume depletion, unspecified: Secondary | ICD-10-CM | POA: Diagnosis not present

## 2014-08-27 DIAGNOSIS — Z882 Allergy status to sulfonamides status: Secondary | ICD-10-CM

## 2014-08-27 DIAGNOSIS — J90 Pleural effusion, not elsewhere classified: Secondary | ICD-10-CM | POA: Insufficient documentation

## 2014-08-27 DIAGNOSIS — R05 Cough: Secondary | ICD-10-CM | POA: Diagnosis not present

## 2014-08-27 DIAGNOSIS — Z6826 Body mass index (BMI) 26.0-26.9, adult: Secondary | ICD-10-CM

## 2014-08-27 DIAGNOSIS — R112 Nausea with vomiting, unspecified: Secondary | ICD-10-CM | POA: Diagnosis not present

## 2014-08-27 DIAGNOSIS — H9190 Unspecified hearing loss, unspecified ear: Secondary | ICD-10-CM | POA: Diagnosis present

## 2014-08-27 DIAGNOSIS — N179 Acute kidney failure, unspecified: Secondary | ICD-10-CM | POA: Diagnosis not present

## 2014-08-27 DIAGNOSIS — R918 Other nonspecific abnormal finding of lung field: Secondary | ICD-10-CM | POA: Diagnosis not present

## 2014-08-27 DIAGNOSIS — D649 Anemia, unspecified: Secondary | ICD-10-CM | POA: Diagnosis not present

## 2014-08-27 DIAGNOSIS — J439 Emphysema, unspecified: Secondary | ICD-10-CM | POA: Diagnosis not present

## 2014-08-27 DIAGNOSIS — E876 Hypokalemia: Secondary | ICD-10-CM | POA: Diagnosis not present

## 2014-08-27 DIAGNOSIS — J45901 Unspecified asthma with (acute) exacerbation: Secondary | ICD-10-CM | POA: Diagnosis not present

## 2014-08-27 DIAGNOSIS — Y95 Nosocomial condition: Secondary | ICD-10-CM | POA: Diagnosis present

## 2014-08-27 LAB — CBC WITH DIFFERENTIAL/PLATELET
Basophils Absolute: 0 10*3/uL (ref 0.0–0.1)
Basophils Absolute: 0.1 10*3/uL (ref 0.0–0.1)
Basophils Relative: 0 % (ref 0–1)
Basophils Relative: 1 % (ref 0–1)
EOS ABS: 0.2 10*3/uL (ref 0.0–0.7)
EOS PCT: 1 % (ref 0–5)
Eosinophils Absolute: 0.1 10*3/uL (ref 0.0–0.7)
Eosinophils Relative: 1 % (ref 0–5)
HCT: 25 % — ABNORMAL LOW (ref 36.0–46.0)
HCT: 24.1 % — ABNORMAL LOW (ref 36.0–46.0)
HEMOGLOBIN: 7.6 g/dL — AB (ref 12.0–15.0)
Hemoglobin: 7.8 g/dL — ABNORMAL LOW (ref 12.0–15.0)
LYMPHS ABS: 2.5 10*3/uL (ref 0.7–4.0)
LYMPHS PCT: 17 % (ref 12–46)
Lymphocytes Relative: 22 % (ref 12–46)
Lymphs Abs: 2.9 10*3/uL (ref 0.7–4.0)
MCH: 28.7 pg (ref 26.0–34.0)
MCH: 28.6 pg (ref 26.0–34.0)
MCHC: 31.2 g/dL (ref 30.0–36.0)
MCHC: 31.5 g/dL (ref 30.0–36.0)
MCV: 91.9 fL (ref 78.0–100.0)
MCV: 90.6 fL (ref 78.0–100.0)
MONO ABS: 1.9 10*3/uL — AB (ref 0.1–1.0)
MONOS PCT: 11 % (ref 3–12)
Monocytes Absolute: 1.5 10*3/uL — ABNORMAL HIGH (ref 0.1–1.0)
Monocytes Relative: 13 % — ABNORMAL HIGH (ref 3–12)
NEUTROS ABS: 9.9 10*3/uL — AB (ref 1.7–7.7)
NEUTROS PCT: 68 % (ref 43–77)
Neutro Abs: 8.8 10*3/uL — ABNORMAL HIGH (ref 1.7–7.7)
Neutrophils Relative %: 66 % (ref 43–77)
PLATELETS: 655 10*3/uL — AB (ref 150–400)
Platelets: 573 10*3/uL — ABNORMAL HIGH (ref 150–400)
RBC: 2.72 MIL/uL — AB (ref 3.87–5.11)
RBC: 2.66 MIL/uL — ABNORMAL LOW (ref 3.87–5.11)
RDW: 15.1 % (ref 11.5–15.5)
RDW: 14.8 % (ref 11.5–15.5)
WBC: 13.3 10*3/uL — ABNORMAL HIGH (ref 4.0–10.5)
WBC: 14.5 10*3/uL — AB (ref 4.0–10.5)

## 2014-08-27 LAB — BASIC METABOLIC PANEL
ANION GAP: 7 (ref 5–15)
BUN: 8 mg/dL (ref 6–20)
CALCIUM: 8.2 mg/dL — AB (ref 8.9–10.3)
CO2: 24 mmol/L (ref 22–32)
Chloride: 107 mmol/L (ref 101–111)
Creatinine, Ser: 0.43 mg/dL — ABNORMAL LOW (ref 0.44–1.00)
GFR calc Af Amer: >60 mL/min (ref >60)
GFR calc non Af Amer: >60 mL/min (ref >60)
Glucose, Bld: 94 mg/dL (ref 65–99)
Potassium: 3.1 mmol/L — ABNORMAL LOW (ref 3.5–5.1)
Sodium: 138 mmol/L (ref 135–145)

## 2014-08-27 LAB — PROCALCITONIN: Procalcitonin: 0.3 ng/mL

## 2014-08-27 LAB — HEPATIC FUNCTION PANEL
ALT: 87 U/L — AB (ref 14–54)
AST: 65 U/L — ABNORMAL HIGH (ref 15–41)
Albumin: 1.9 g/dL — ABNORMAL LOW (ref 3.5–5.0)
Alkaline Phosphatase: 106 U/L (ref 38–126)
BILIRUBIN TOTAL: 0.2 mg/dL — AB (ref 0.3–1.2)
Bilirubin, Direct: <0.1 mg/dL — ABNORMAL LOW (ref 0.1–0.5)
TOTAL PROTEIN: 6.4 g/dL — AB (ref 6.5–8.1)

## 2014-08-27 LAB — POC OCCULT BLOOD, ED: FECAL OCCULT BLD: NEGATIVE

## 2014-08-27 LAB — I-STAT CG4 LACTIC ACID, ED: Lactic Acid, Venous: 0.75 mmol/L (ref 0.5–2.0)

## 2014-08-27 MED ORDER — SODIUM CHLORIDE 0.9 % IJ SOLN
3.0000 mL | INTRAMUSCULAR | Status: DC | PRN
  Administered 2014-08-31: 3 mL via INTRAVENOUS
  Filled 2014-08-27: qty 3

## 2014-08-27 MED ORDER — PIPERACILLIN-TAZOBACTAM 4.5 G IVPB: 4.5000 g | Freq: Once | INTRAVENOUS | Status: DC

## 2014-08-27 MED ORDER — BUDESONIDE-FORMOTEROL FUMARATE 80-4.5 MCG/ACT IN AERO
2.0000 | INHALATION_SPRAY | Freq: Two times a day (BID) | RESPIRATORY_TRACT | Status: DC
Start: 2014-08-27 — End: 2014-09-01
  Administered 2014-08-27 – 2014-09-01 (×10): 2 via RESPIRATORY_TRACT
  Filled 2014-08-27: qty 6.9

## 2014-08-27 MED ORDER — SODIUM CHLORIDE 0.9 % IV SOLN
INTRAVENOUS | Status: AC
  Administered 2014-08-27: 75 mL/h via INTRAVENOUS

## 2014-08-27 MED ORDER — PIPERACILLIN-TAZOBACTAM 3.375 G IVPB
3.3750 g | Freq: Three times a day (TID) | INTRAVENOUS | Status: DC
  Administered 2014-08-28 – 2014-09-01 (×13): 3.375 g via INTRAVENOUS
  Filled 2014-08-27 (×14): qty 50

## 2014-08-27 MED ORDER — SODIUM CHLORIDE 0.9 % IV SOLN
250.0000 mL | INTRAVENOUS | Status: DC | PRN
  Administered 2014-08-29: 250 mL via INTRAVENOUS

## 2014-08-27 MED ORDER — LORATADINE 10 MG PO TABS
10.0000 mg | ORAL_TABLET | Freq: Every day | ORAL | Status: DC
  Administered 2014-08-28 – 2014-09-01 (×5): 10 mg via ORAL
  Filled 2014-08-27 (×5): qty 1

## 2014-08-27 MED ORDER — ACETAMINOPHEN 500 MG PO TABS
1000.0000 mg | ORAL_TABLET | Freq: Once | ORAL | Status: AC
  Administered 2014-08-27: 1000 mg via ORAL
  Filled 2014-08-27: qty 2

## 2014-08-27 MED ORDER — VANCOMYCIN HCL 10 G IV SOLR
1500.0000 mg | Freq: Once | INTRAVENOUS | Status: AC
  Administered 2014-08-27: 1500 mg via INTRAVENOUS
  Filled 2014-08-27: qty 1500

## 2014-08-27 MED ORDER — SODIUM CHLORIDE 0.9 % IJ SOLN
3.0000 mL | Freq: Two times a day (BID) | INTRAMUSCULAR | Status: DC
  Administered 2014-08-29 (×2): 3 mL via INTRAVENOUS

## 2014-08-27 MED ORDER — POTASSIUM CHLORIDE CRYS ER 20 MEQ PO TBCR
60.0000 meq | EXTENDED_RELEASE_TABLET | Freq: Once | ORAL | Status: AC
  Administered 2014-08-27: 60 meq via ORAL
  Filled 2014-08-27: qty 3

## 2014-08-27 MED ORDER — SODIUM CHLORIDE 0.9 % IV BOLUS (SEPSIS)
1000.0000 mL | Freq: Once | INTRAVENOUS | Status: AC
  Administered 2014-08-27: 1000 mL via INTRAVENOUS

## 2014-08-27 MED ORDER — ALBUTEROL SULFATE (2.5 MG/3ML) 0.083% IN NEBU: 2.5000 mg | INHALATION_SOLUTION | Freq: Four times a day (QID) | RESPIRATORY_TRACT | Status: DC | PRN

## 2014-08-27 MED ORDER — PIPERACILLIN-TAZOBACTAM 3.375 G IVPB 30 MIN
3.3750 g | INTRAVENOUS | Status: AC
  Administered 2014-08-27: 3.375 g via INTRAVENOUS
  Filled 2014-08-27: qty 50

## 2014-08-27 MED ORDER — RALOXIFENE HCL 60 MG PO TABS
60.0000 mg | ORAL_TABLET | Freq: Every day | ORAL | Status: DC
  Administered 2014-08-28 – 2014-09-01 (×5): 60 mg via ORAL
  Filled 2014-08-27 (×7): qty 1

## 2014-08-27 MED ORDER — VANCOMYCIN HCL IN DEXTROSE 750-5 MG/150ML-% IV SOLN
750.0000 mg | Freq: Two times a day (BID) | INTRAVENOUS | Status: DC
  Administered 2014-08-28 – 2014-08-31 (×8): 750 mg via INTRAVENOUS
  Filled 2014-08-27 (×9): qty 150

## 2014-08-27 MED ORDER — FLUTICASONE PROPIONATE 50 MCG/ACT NA SUSP
1.0000 | Freq: Every day | NASAL | Status: DC
  Administered 2014-08-27 – 2014-08-28 (×2): 1 via NASAL
  Administered 2014-08-29: 2 via NASAL
  Administered 2014-08-30 – 2014-08-31 (×2): 1 via NASAL
  Filled 2014-08-27: qty 16

## 2014-08-27 NOTE — H&P (Signed)
PCP:  Mayra Neer, MD  [facility]  Referring provider ONi   Chief Complaint: Fatigue   HPI: [name] is a 74 y.o. female   has a past medical history of Asthma; Macular degeneration; Hearing loss; Osteopenia; Arthritis; and Cancer of breast.   Presented 2 weeks after discharge to home with symptoms of fatigue. Patient was evaluated by her PCP who will obtain chest x-ray showing worrisome for new infiltrate. Patient was sent to emerge department. In ER she was found to have hemoglobin of 7.8. White blood cell count 13.3 patient is tachycardic up to 110 respiratory rate up to 27 febrile up to 100.9 meeting criteria for sepsis. Lactic acid was noted to be 0.75. Patient reports feeling better for about a week after discharge but she started to get weak. She fell down using a cane it was not a violent fall. She went back to using walker. She continued to decompensate. Reports mild cough, a bit short of breath worse with exertion. Denies any blood in stool. Hemoccult negative in ER. No black stools. Reports some fever up to 100.5 off and on for the past week. Denis any abdominal  pain no burning with urination.   From May 7-18 patient was admitted for community-acquired pneumonia with empyema resulting in sepsis and hypoxia with respiratory failure. Patient had pulmonary consult and thoracentesis done on May 13 status post removal 1 L of purulent fluid. CT scan at that time showed some cavitation. Patient was treated with Zosyn and then discharged home on oral Augmentin. Blood cultures as well as pleural fluid cultures were negative. Patient had ID consultation done which recommended 21 day course of a metabolic total patient was discharged to continue 10 more days. Of note she was also found to have mesenteric lymphadenopathy on CT scan the plan was to follow-up to rule out lymphoma. Of note during her Lasix at that hospitalization patient undergone  echogram that showed EF of 50-55%  Hospitalist was called for admission for sepsis due to HCAP  Review of Systems:    Pertinent positives include:   Constitutional:  No weight loss, night sweats, Fevers, chills, fatigue, weight loss  HEENT:  No headaches, Difficulty swallowing,Tooth/dental problems,Sore throat,  No sneezing, itching, ear ache, nasal congestion, post nasal drip,  Cardio-vascular:  No chest pain, Orthopnea, PND, anasarca, dizziness, palpitations.no Bilateral lower extremity swelling  GI:  No heartburn, indigestion, abdominal pain, nausea, vomiting, diarrhea, change in bowel habits, loss of appetite, melena, blood in stool, hematemesis Resp:  no shortness of breath at rest. No dyspnea on exertion, No excess mucus, no productive cough, No non-productive cough, No coughing up of blood.No change in color of mucus.No wheezing. Skin:  no rash or lesions. No jaundice GU:  no dysuria, change in color of urine, no urgency or frequency. No straining to urinate.  No flank pain.  Musculoskeletal:  No joint pain or no joint swelling. No decreased range of motion. No back pain.  Psych:  No change in mood or affect. No depression or anxiety. No memory loss.  Neuro: no localizing neurological complaints, no tingling, no weakness, no double vision, no gait abnormality, no slurred speech, no confusion  Otherwise ROS are negative except for above, 10 systems were reviewed  Past Medical History: Past Medical History  Diagnosis Date  . Asthma   . Macular degeneration   . Hearing loss     both ears, wwears hearing aides  . Osteopenia   .  Arthritis   . Cancer of breast     33 years ago, left,    Past Surgical History  Procedure Laterality Date  . Abdominal hysterectomy    . Cesarean section      x 2  . Breast enhancement surgery    . Left heart catheterization with coronary angiogram N/A 01/24/2012    Procedure: LEFT HEART CATHETERIZATION WITH CORONARY ANGIOGRAM;  Surgeon:  Sueanne Margarita, MD;  Location: [facility];  Service: Cardiovascular;  Laterality: N/A;  . Mastectomy  33 yrs ago    bilateral  . Eye surgery Bilateral march 2015 and june 2015    lens for cataracts  . Colonoscopy with propofol N/A 04/21/2014    Procedure: COLONOSCOPY WITH PROPOFOL;  Surgeon: Garlan Fair, MD;  Location: [facility];  Service: Endoscopy;  Laterality: N/A;     Medications: Prior to Admission medications   Medication Sig Start Date End Date Taking? Authorizing Provider  albuterol (PROAIR HFA) 108 (90 BASE) MCG/ACT inhaler Inhale 2 puffs into the lungs every 6 (six) hours as needed for wheezing. 04/19/12  Yes [name], MD  albuterol (PROVENTIL) (2.5 MG/3ML) 0.083% nebulizer solution Take 3 mLs (2.5 mg total) by nebulization every 6 (six) hours as needed. DX 493.90 04/26/12  Yes [name], MD  azelastine (ASTELIN) 137 MCG/SPRAY nasal spray Place 1 spray into the nose at bedtime. Use in each nostril as directed   Yes Historical Provider, MD  budesonide-formoterol (SYMBICORT) 80-4.5 MCG/ACT inhaler Inhale 2 puffs into the lungs 2 (two) times daily.   Yes Historical Provider, MD  calcium carbonate (TUMS) 500 MG chewable tablet Chew 1 tablet by mouth at bedtime.    Yes Historical Provider, MD  Cholecalciferol (VITAMIN D) 1000 UNITS capsule Take 1,000 Units by mouth daily with breakfast.    Yes Historical Provider, MD  fexofenadine (ALLEGRA) 180 MG tablet Take 180 mg by mouth daily with breakfast.    Yes Historical Provider, MD  fluticasone (FLONASE) 50 MCG/ACT nasal spray Place 1-2 sprays into both nostrils at bedtime.    Yes Historical Provider, MD  meloxicam (MOBIC) 15 MG tablet Take 15 mg by mouth daily.   Yes Historical Provider, MD  Multiple Vitamins-Minerals (MULTI FOR HER 50+) TABS Take 1 tablet by mouth daily.   Yes Historical Provider, MD  Multiple Vitamins-Minerals (PRESERVISION/LUTEIN) CAPS Take 1 capsule by mouth 2 (two) times daily.   Yes Historical Provider, MD    Omega-3 Fatty Acids (FISH OIL) 1200 MG CAPS Take 1,200 mg by mouth at bedtime.   Yes Historical Provider, MD  raloxifene (EVISTA) 60 MG tablet Take 60 mg by mouth daily with breakfast.    Yes Historical Provider, MD  amoxicillin-clavulanate (AUGMENTIN XR) 1000-62.5 MG per tablet Take 2 tablets by mouth every 12 (twelve) hours. Patient not taking: Reported on 08/27/2014 08/13/14   Venetia Maxon Rama, MD  guaiFENesin-dextromethorphan (ROBITUSSIN DM) 100-10 MG/5ML syrup Take 5 mLs by mouth every 4 (four) hours as needed for cough. Patient not taking: Reported on 08/27/2014 08/13/14   Venetia Maxon Rama, MD  predniSONE (DELTASONE) 20 MG tablet Take 2 tablets (40 mg total) by mouth daily with breakfast. Patient not taking: Reported on 08/27/2014 08/13/14   Venetia Maxon Rama, MD    Allergies:   Allergies  Allergen Reactions  . Fosamax [Alendronate Sodium] Nausea Only  . Sulfonamide Derivatives Rash    Social History:  Ambulatory   Walker with  Kasandra Knudsen  Lives at home  With family  reports that she has never smoked. She has never used smokeless tobacco. She reports that she drinks alcohol. She reports that she does not use illicit drugs.    Family History: family history includes Heart attack in her father.    Physical Exam: Patient Vitals for the past 24 hrs:  BP Temp Temp src Pulse Resp SpO2  08/27/14 1900 130/58 mmHg - - 103 (!) 27 93 %  08/27/14 1845 - - - 106 25 93 %  08/27/14 1838 - 100.9 F (38.3 C) Rectal - - -  08/27/14 1754 - 98.4 F (36.9 C) Oral - - -  08/27/14 1752 124/55 mmHg - - 110 20 93 %    1. General:  in No Acute distress 2. Psychological: Alert and  Oriented 3. Head/ENT:    Dry Mucous Membranes                          Head Non traumatic, neck supple                          Normal   Dentition 4. SKIN:  decreased Skin turgor,  Skin clean Dry and intact no rash 5. Heart: Regular rate and rhythm no Murmur, Rub or gallop 6. Lungs:   no wheezes some crackle, diminished  air movement  7. Abdomen: Soft, non-tender, Non distended 8. Lower extremities: no clubbing, cyanosis, or edema 9. Neurologically Grossly intact, moving all 4 extremities equally 10. MSK: Normal range of motion  body mass index is unknown because there is no weight on file.   Labs on Admission:   Results for orders placed or performed during the hospital encounter of 08/27/14 (from the past 24 hour(s))  CBC with Differential/Platelet     Status: Abnormal (Preliminary result)   Collection Time: 08/27/14  6:34 PM  Result Value Ref Range   WBC 13.3 (H) 4.0 - 10.5 K/uL   RBC 2.72 (L) 3.87 - 5.11 MIL/uL   Hemoglobin 7.8 (L) 12.0 - 15.0 g/dL   HCT 25.0 (L) 36.0 - 46.0 %   MCV 91.9 78.0 - 100.0 fL   MCH 28.7 26.0 - 34.0 pg   MCHC 31.2 30.0 - 36.0 g/dL   RDW 15.1 11.5 - 15.5 %   Platelets 655 (H) 150 - 400 K/uL   Neutrophils Relative % PENDING 43 - 77 %   Neutro Abs PENDING 1.7 - 7.7 K/uL   Band Neutrophils PENDING 0 - 10 %   Lymphocytes Relative PENDING 12 - 46 %   Lymphs Abs PENDING 0.7 - 4.0 K/uL   Monocytes Relative PENDING 3 - 12 %   Monocytes Absolute PENDING 0.1 - 1.0 K/uL   Eosinophils Relative PENDING 0 - 5 %   Eosinophils Absolute PENDING 0.0 - 0.7 K/uL   Basophils Relative PENDING 0 - 1 %   Basophils Absolute PENDING 0.0 - 0.1 K/uL   WBC Morphology PENDING    RBC Morphology PENDING    Smear Review PENDING    nRBC PENDING 0 /100 WBC   Metamyelocytes Relative PENDING %   Myelocytes PENDING %   Promyelocytes Absolute PENDING %   Blasts PENDING %  Basic metabolic panel     Status: Abnormal   Collection Time: 08/27/14  6:34 PM  Result Value Ref Range   Sodium 138 135 - 145 mmol/L   Potassium 3.1 (L) 3.5 - 5.1 mmol/L   Chloride 107 101 - 111  mmol/L   CO2 24 22 - 32 mmol/L   Glucose, Bld 94 65 - 99 mg/dL   BUN 8 6 - 20 mg/dL   Creatinine, Ser 0.43 (L) 0.44 - 1.00 mg/dL   Calcium 8.2 (L) 8.9 - 10.3 mg/dL   GFR calc non Af Amer >60 >60 mL/min   GFR calc Af Amer >60  >60 mL/min   Anion gap 7 5 - 15  I-Stat CG4 Lactic Acid, ED     Status: None   Collection Time: 08/27/14  6:47 PM  Result Value Ref Range   Lactic Acid, Venous 0.75 0.5 - 2.0 mmol/L    UA ordered  No results found for: HGBA1C  CrCl cannot be calculated (Unknown ideal weight.).  BNP (last 3 results) No results for input(s): PROBNP in the last 8760 hours.  Other results:  I have pearsonaly reviewed this: ECG REPORT not obtained  There were no vitals filed for this visit.   Cultures:    Component Value Date/Time   SDES PLEURAL 08/08/2014 1112   SPECREQUEST NONE 08/08/2014 1112   CULT  08/08/2014 1112    NO GROWTH 3 DAYS Performed at [facility] 08/11/2014 FINAL 08/08/2014 1112     Radiological Exams on Admission: Dg Chest 2 View  08/27/2014   CLINICAL DATA:  Recent pneumonia with persistent difficulty breathing and cough  EXAM: CHEST  2 VIEW  COMPARISON:  Chest radiograph and chest CT Aug 09, 2014  FINDINGS: There has been interval clearing of infiltrate from much of the left lung. There remains patchy infiltrate in the left base. On the right, however, there is new airspace consolidation in the right middle lobe. Left upper lobe infiltrate has cleared since prior study. There is a small right effusion. Heart size and pulmonary vascularity are normal. No adenopathy.  IMPRESSION: In comparison with recent prior studies, there is new airspace consolidation in the right middle lobe. There is stable patchy infiltrate in the left base. There is a small right effusion. Elsewhere, lungs are clear; there has been significant upper lobe clearing compared to recent prior studies. No change in cardiac silhouette.   Electronically Signed   By: Lowella Grip III M.D.   On: 08/27/2014 15:14    Chart has been reviewed  Family  at  Bedside  plan of care was discussed with Husband  Eddie Dibbles Jawad [phone] home Assessment/Plan  74 yo F with recent admission for  sepsis due to pneumonia with cavitary lesion and empyema currently admitted for recurrent PNA.    Present on Admission:  . Sepsis - in the setting of HCAP., currently vital signs stable. We'll admit to telemetry. Obtain pro-calcitonin. Broad spectrum IV into biotics and IV fluids  . Hypokalemia - was replaced in ER will monitor on tele.  Marland Kitchen HCAP (healthcare-associated pneumonia) -  will admit for treatment of HCAP will start on appropriate antibiotic coverage.   Obtain sputum cultures, blood cultures if febrile or if decompensates.  Provide oxygen as needed. Discussed case with pulmonology. Patient has no risk factor for tuberculosis. Denies any travel history no history of exposure to TB. Pulmonology is aware and will consult in AM Anemia - cause unclear.  Hemoccult-negative will obtain anemia panel. Obtain type and screen.  Given hx of unexplained lymphadenopathy she would benefit with hematology evaluation, if there is no clear explanation of anemia. Transfuse as needed Prophylaxis: SCD  Given  unclear cause of anemia will hold off on lovenox for  now.   CODE STATUS:  FULL CODE as per patient   Disposition:   To home once workup is complete and patient is stable  Other plan as per orders.  I have spent a total of 65 min on this admission time was taken to discuss case with pulmonology  Dailey 08/27/2014, 7:54 PM  [facility]  Pager [phone]   after 2 AM please page floor coverage PA If 7AM-7PM, please contact the day team taking care of the patient  [email or web address]  Password [email or web address]

## 2014-08-27 NOTE — Progress Notes (Signed)
ANTIBIOTIC CONSULT NOTE - INITIAL  Pharmacy Consult for Vancomycin/Zosyn Indication: pneumonia  Allergies  Allergen Reactions  . Fosamax [Alendronate Sodium] Nausea Only  . Sulfonamide Derivatives Rash    Patient Measurements: Height: 4\' 11"  (149.9 cm) Weight: 129 lb 10.1 oz (58.8 kg) IBW/kg (Calculated) : 43.2   Vital Signs: Temp: 98.3 F (36.8 C) (06/01 2228) Temp Source: Oral (06/01 2228) BP: 132/57 mmHg (06/01 2228) Pulse Rate: 100 (06/01 2228) Intake/Output from previous day:   Intake/Output from this shift:    Labs:  Recent Labs  08/27/14 1834 08/27/14 2015  WBC 13.3* 14.5*  HGB 7.8* 7.6*  PLT 655* 573*  CREATININE 0.43*  --    Estimated Creatinine Clearance: 48.8 mL/min (by C-G formula based on Cr of 0.43). No results for input(s): VANCOTROUGH, VANCOPEAK, VANCORANDOM, GENTTROUGH, GENTPEAK, GENTRANDOM, TOBRATROUGH, TOBRAPEAK, TOBRARND, AMIKACINPEAK, AMIKACINTROU, AMIKACIN in the last 72 hours.   Microbiology: Recent Results (from the past 720 hour(s))  Blood culture (routine x 2)     Status: None   Collection Time: 08/02/14  3:32 PM  Result Value Ref Range Status   Specimen Description BLOOD RIGHT ANTECUBITAL  Final   Special Requests BOTTLES DRAWN AEROBIC AND ANAEROBIC 4 CC EACH  Final   Culture   Final    NO GROWTH 5 DAYS Performed at [facility]    Report Status 08/09/2014 FINAL  Final  Blood culture (routine x 2)     Status: None   Collection Time: 08/02/14  3:45 PM  Result Value Ref Range Status   Specimen Description BLOOD RIGHT ANTECUBITAL  Final   Special Requests BOTTLES DRAWN AEROBIC AND ANAEROBIC 5 CC EACH  Final   Culture   Final    NO GROWTH 5 DAYS Performed at [facility]    Report Status 08/09/2014 FINAL  Final  MRSA PCR Screening     Status: None   Collection Time: 08/02/14  7:00 PM  Result Value Ref Range Status   MRSA by PCR NEGATIVE NEGATIVE Final    Comment:        The GeneXpert MRSA Assay (FDA approved  for NASAL specimens only), is one component of a comprehensive MRSA colonization surveillance program. It is not intended to diagnose MRSA infection nor to guide or monitor treatment for MRSA infections.   Urine culture     Status: None   Collection Time: 08/02/14  8:38 PM  Result Value Ref Range Status   Specimen Description URINE, RANDOM  Final   Special Requests NONE  Final   Colony Count NO GROWTH Performed at [facility]   Final   Culture NO GROWTH Performed at [facility]   Final   Report Status 08/03/2014 FINAL  Final  Clostridium Difficile by PCR     Status: None   Collection Time: 08/03/14 10:13 AM  Result Value Ref Range Status   C difficile by pcr NEGATIVE NEGATIVE Final  Body fluid culture     Status: None   Collection Time: 08/08/14 11:12 AM  Result Value Ref Range Status   Specimen Description PLEURAL  Final   Special Requests NONE  Final   Gram Stain   Final    MODERATE WBC PRESENT, PREDOMINANTLY PMN NO ORGANISMS SEEN Performed at [facility]    Culture   Final    NO GROWTH 3 DAYS Performed at [facility]    Report Status 08/11/2014 FINAL  Final    Medical History: Past Medical History  Diagnosis  Date  . Asthma   . Macular degeneration   . Hearing loss     both ears, wwears hearing aides  . Osteopenia   . Arthritis   . Cancer of breast     33 years ago, left,     Medications:  Prescriptions prior to admission  Medication Sig Dispense Refill Last Dose  . albuterol (PROAIR HFA) 108 (90 BASE) MCG/ACT inhaler Inhale 2 puffs into the lungs every 6 (six) hours as needed for wheezing. 1 Inhaler 3 08/26/2014 at Unknown time  . albuterol (PROVENTIL) (2.5 MG/3ML) 0.083% nebulizer solution Take 3 mLs (2.5 mg total) by nebulization every 6 (six) hours as needed. DX 493.90 360 mL 5 unknown  . azelastine (ASTELIN) 137 MCG/SPRAY nasal spray Place 1 spray into the nose at bedtime. Use in each nostril as directed   08/26/2014  at Unknown time  . budesonide-formoterol (SYMBICORT) 80-4.5 MCG/ACT inhaler Inhale 2 puffs into the lungs 2 (two) times daily.   08/27/2014 at Unknown time  . calcium carbonate (TUMS) 500 MG chewable tablet Chew 1 tablet by mouth at bedtime.    08/26/2014 at Unknown time  . Cholecalciferol (VITAMIN D) 1000 UNITS capsule Take 1,000 Units by mouth daily with breakfast.    08/27/2014 at Unknown time  . fexofenadine (ALLEGRA) 180 MG tablet Take 180 mg by mouth daily with breakfast.    08/27/2014 at Unknown time  . fluticasone (FLONASE) 50 MCG/ACT nasal spray Place 1-2 sprays into both nostrils at bedtime.    08/26/2014 at Unknown time  . meloxicam (MOBIC) 15 MG tablet Take 15 mg by mouth daily.   08/26/2014 at Unknown time  . Multiple Vitamins-Minerals (MULTI FOR HER 50+) TABS Take 1 tablet by mouth daily.   08/27/2014 at Unknown time  . Multiple Vitamins-Minerals (PRESERVISION/LUTEIN) CAPS Take 1 capsule by mouth 2 (two) times daily.   08/27/2014 at Unknown time  . Omega-3 Fatty Acids (FISH OIL) 1200 MG CAPS Take 1,200 mg by mouth at bedtime.   08/26/2014 at Unknown time  . raloxifene (EVISTA) 60 MG tablet Take 60 mg by mouth daily with breakfast.    08/27/2014 at Unknown time  . amoxicillin-clavulanate (AUGMENTIN XR) 1000-62.5 MG per tablet Take 2 tablets by mouth every 12 (twelve) hours. (Patient not taking: Reported on 08/27/2014) 20 tablet 0 Completed Course at Unknown time  . guaiFENesin-dextromethorphan (ROBITUSSIN DM) 100-10 MG/5ML syrup Take 5 mLs by mouth every 4 (four) hours as needed for cough. (Patient not taking: Reported on 08/27/2014) 118 mL 0 Completed Course at Unknown time  . predniSONE (DELTASONE) 20 MG tablet Take 2 tablets (40 mg total) by mouth daily with breakfast. (Patient not taking: Reported on 08/27/2014) 5 tablet 0 Completed Course at Unknown time   Scheduled:  . budesonide-formoterol  2 puff Inhalation BID  . fluticasone  1-2 spray Each Nare QHS  . [START ON 08/28/2014] loratadine  10 mg Oral  Daily  . [START ON 08/28/2014] piperacillin-tazobactam (ZOSYN)  IV  3.375 g Intravenous Q8H  . [START ON 08/28/2014] raloxifene  60 mg Oral Q breakfast  . sodium chloride  3 mL Intravenous Q12H  . [START ON 08/28/2014] vancomycin  750 mg Intravenous Q12H   Infusions:  . sodium chloride     Assessment: 28 yoF presents with fatigue s/p recent admission for sepsis d/t PNA with cavitary lesion and empyema now being readmitted with recurrent PNA.  Vancomycin per Rx and Zosyn per MD for PNA.   Goal of Therapy:  Vancomycin trough  level 15-20 mcg/ml  Plan:   Zosyn EI  Vancomycin 1500mg  x1 then 750mg  IV q12h  F/u scr/cultures/levels as needed  Dorrene German 08/27/2014,10:46 PM

## 2014-08-27 NOTE — ED Notes (Signed)
Pt states that she was just released from here 2 weeks ago for PNA.  Pt was at her PCP today for follow up apt. Pt was sent here due to her chest xray showing fluid in her lungs.  Pt states that she will have sweats occasionally but doesn't take her temperature to see if having a fever or not.

## 2014-08-27 NOTE — ED Provider Notes (Signed)
CSN: [id number]     Arrival date & time 08/27/14  1740 History   First MD Initiated Contact with Patient 08/27/14 1802     Chief Complaint  Patient presents with  . fluid in lungs      (Consider location/radiation/quality/duration/timing/severity/associated sxs/prior Treatment) HPI  [name] is a 74 y.o. female with past medical history of breast cancer presenting today with continued weakness.  Patient was recently discharged 2 weeks ago with a cavitary pneumonia requiring drainage. She was sent home with 10 days and a box which was completed 4 days ago. She is here because follow-up chest x-ray shows new right middle lobe pneumonia with effusion. During the interval patient denies fevers chills or coughing. She has had increasing weakness. She denies any other complaints.  10 Systems reviewed and are negative for acute change except as noted in the HPI.    Past Medical History  Diagnosis Date  . Asthma   . Macular degeneration   . Hearing loss     both ears, wwears hearing aides  . Osteopenia   . Arthritis   . Cancer of breast     33 years ago, left,    Past Surgical History  Procedure Laterality Date  . Abdominal hysterectomy    . Cesarean section      x 2  . Breast enhancement surgery    . Left heart catheterization with coronary angiogram N/A 01/24/2012    Procedure: LEFT HEART CATHETERIZATION WITH CORONARY ANGIOGRAM;  Surgeon: Sueanne Margarita, MD;  Location: [facility];  Service: Cardiovascular;  Laterality: N/A;  . Mastectomy  33 yrs ago    bilateral  . Eye surgery Bilateral march 2015 and june 2015    lens for cataracts  . Colonoscopy with propofol N/A 04/21/2014    Procedure: COLONOSCOPY WITH PROPOFOL;  Surgeon: Garlan Fair, MD;  Location: [facility];  Service: Endoscopy;  Laterality: N/A;   Family History  Problem Relation Age of Onset  . Heart attack Father    History  Substance Use Topics  . Smoking status: Never Smoker   . Smokeless tobacco:  Never Used  . Alcohol Use: Yes     Comment: glass wine 1x month   OB History    No data available     Review of Systems    Allergies  Fosamax and Sulfonamide derivatives  Home Medications   Prior to Admission medications   Medication Sig Start Date End Date Taking? Authorizing Provider  albuterol (PROAIR HFA) 108 (90 BASE) MCG/ACT inhaler Inhale 2 puffs into the lungs every 6 (six) hours as needed for wheezing. 04/19/12  Yes [name], MD  albuterol (PROVENTIL) (2.5 MG/3ML) 0.083% nebulizer solution Take 3 mLs (2.5 mg total) by nebulization every 6 (six) hours as needed. DX 493.90 04/26/12  Yes [name], MD  azelastine (ASTELIN) 137 MCG/SPRAY nasal spray Place 1 spray into the nose at bedtime. Use in each nostril as directed   Yes Historical Provider, MD  budesonide-formoterol (SYMBICORT) 80-4.5 MCG/ACT inhaler Inhale 2 puffs into the lungs 2 (two) times daily.   Yes Historical Provider, MD  calcium carbonate (TUMS) 500 MG chewable tablet Chew 1 tablet by mouth at bedtime.    Yes Historical Provider, MD  Cholecalciferol (VITAMIN D) 1000 UNITS capsule Take 1,000 Units by mouth daily with breakfast.    Yes Historical Provider, MD  fexofenadine (ALLEGRA) 180 MG tablet Take 180 mg by mouth daily with breakfast.    Yes Historical Provider,  MD  fluticasone (FLONASE) 50 MCG/ACT nasal spray Place 1-2 sprays into both nostrils at bedtime.    Yes Historical Provider, MD  meloxicam (MOBIC) 15 MG tablet Take 15 mg by mouth daily.   Yes Historical Provider, MD  Multiple Vitamins-Minerals (MULTI FOR HER 50+) TABS Take 1 tablet by mouth daily.   Yes Historical Provider, MD  Multiple Vitamins-Minerals (PRESERVISION/LUTEIN) CAPS Take 1 capsule by mouth 2 (two) times daily.   Yes Historical Provider, MD  Omega-3 Fatty Acids (FISH OIL) 1200 MG CAPS Take 1,200 mg by mouth at bedtime.   Yes Historical Provider, MD  raloxifene (EVISTA) 60 MG tablet Take 60 mg by mouth daily with breakfast.    Yes  Historical Provider, MD  amoxicillin-clavulanate (AUGMENTIN XR) 1000-62.5 MG per tablet Take 2 tablets by mouth every 12 (twelve) hours. Patient not taking: Reported on 08/27/2014 08/13/14   Venetia Maxon Rama, MD  guaiFENesin-dextromethorphan (ROBITUSSIN DM) 100-10 MG/5ML syrup Take 5 mLs by mouth every 4 (four) hours as needed for cough. Patient not taking: Reported on 08/27/2014 08/13/14   Venetia Maxon Rama, MD  predniSONE (DELTASONE) 20 MG tablet Take 2 tablets (40 mg total) by mouth daily with breakfast. Patient not taking: Reported on 08/27/2014 08/13/14   Venetia Maxon Rama, MD   BP 124/55 mmHg  Pulse 106  Temp(Src) 100.9 F (38.3 C) (Rectal)  Resp 25  SpO2 93% Physical Exam  Constitutional: She is oriented to person, place, and time. She appears well-developed and well-nourished. No distress.  HENT:  Head: Normocephalic and atraumatic.  Nose: Nose normal.  Mouth/Throat: Oropharynx is clear and moist. No oropharyngeal exudate.  Eyes: Conjunctivae and EOM are normal. Pupils are equal, round, and reactive to light. No scleral icterus.  Neck: Normal range of motion. Neck supple. No JVD present. No tracheal deviation present. No thyromegaly present.  Cardiovascular: Normal rate, regular rhythm and normal heart sounds.  Exam reveals no gallop and no friction rub.   No murmur heard. Pulmonary/Chest: Effort normal and breath sounds normal. No respiratory distress. She has no wheezes. She exhibits no tenderness.  Intermittent wheezes heard in bilateral lower lobes.  Abdominal: Soft. Bowel sounds are normal. She exhibits no distension and no mass. There is no tenderness. There is no rebound and no guarding.  Musculoskeletal: Normal range of motion. She exhibits edema. She exhibits no tenderness.  Left greater than right peripheral edema.  Lymphadenopathy:    She has no cervical adenopathy.  Neurological: She is alert and oriented to person, place, and time. No cranial nerve deficit. She exhibits  normal muscle tone.  Skin: Skin is warm and dry. No rash noted. She is not diaphoretic. No erythema. No pallor.  Nursing note and vitals reviewed.   ED Course  Procedures (including critical care time) Labs Review Labs Reviewed  CBC WITH DIFFERENTIAL/PLATELET - Abnormal; Notable for the following:    WBC 13.3 (*)    RBC 2.72 (*)    Hemoglobin 7.8 (*)    HCT 25.0 (*)    Platelets 655 (*)    All other components within normal limits  BASIC METABOLIC PANEL - Abnormal; Notable for the following:    Potassium 3.1 (*)    Creatinine, Ser 0.43 (*)    Calcium 8.2 (*)    All other components within normal limits  CULTURE, BLOOD (ROUTINE X 2)  CULTURE, BLOOD (ROUTINE X 2)  I-STAT CG4 LACTIC ACID, ED    Imaging Review Dg Chest 2 View  08/27/2014   CLINICAL DATA:  Recent pneumonia with persistent difficulty breathing and cough  EXAM: CHEST  2 VIEW  COMPARISON:  Chest radiograph and chest CT Aug 09, 2014  FINDINGS: There has been interval clearing of infiltrate from much of the left lung. There remains patchy infiltrate in the left base. On the right, however, there is new airspace consolidation in the right middle lobe. Left upper lobe infiltrate has cleared since prior study. There is a small right effusion. Heart size and pulmonary vascularity are normal. No adenopathy.  IMPRESSION: In comparison with recent prior studies, there is new airspace consolidation in the right middle lobe. There is stable patchy infiltrate in the left base. There is a small right effusion. Elsewhere, lungs are clear; there has been significant upper lobe clearing compared to recent prior studies. No change in cardiac silhouette.   Electronically Signed   By: Lowella Grip III M.D.   On: 08/27/2014 15:14     EKG Interpretation None      MDM   Final diagnoses:  None   patient presents to the emergency department for recurrent pneumonia diagnosed at primary care physician's office. X-ray reveals new right  middle lobe and pneumonia with effusion. White blood cell count here is 13. Patient is febrile to 100.9. She was given 1 g of Tylenol. Blood cultures were drawn, she was given vancomycin and Zosyn for treatment. Patient will be considered 8 And will require admission to the hospital. She was also given IV fluids for tachycardia.  She has swelling in her legs, left greater than right. During her last admission a Doppler ultrasound was done, this will not be repeated in the emergency department. Will page [facility] hospitalist for admission.  Potassium is 3.1, this was replaced with 60 mEq in the emergency department.  Will admit to hospitalist for continued care.  HGB dropped significantly since last admission.  Hemoccult negative.  Everlene Balls, MD 08/28/14 [phone]

## 2014-08-28 ENCOUNTER — Inpatient Hospital Stay (HOSPITAL_COMMUNITY): Payer: Commercial Managed Care - HMO

## 2014-08-28 DIAGNOSIS — E440 Moderate protein-calorie malnutrition: Secondary | ICD-10-CM

## 2014-08-28 DIAGNOSIS — K529 Noninfective gastroenteritis and colitis, unspecified: Secondary | ICD-10-CM

## 2014-08-28 DIAGNOSIS — A419 Sepsis, unspecified organism: Principal | ICD-10-CM

## 2014-08-28 DIAGNOSIS — D72829 Elevated white blood cell count, unspecified: Secondary | ICD-10-CM

## 2014-08-28 DIAGNOSIS — M7989 Other specified soft tissue disorders: Secondary | ICD-10-CM

## 2014-08-28 HISTORY — DX: Moderate protein-calorie malnutrition: E44.0

## 2014-08-28 LAB — BODY FLUID CELL COUNT WITH DIFFERENTIAL
LYMPHS FL: 68 %
MONOCYTE-MACROPHAGE-SEROUS FLUID: 14 % — AB (ref 50–90)
NEUTROPHIL FLUID: 18 % (ref 0–25)
Total Nucleated Cell Count, Fluid: 303 cu mm (ref 0–1000)

## 2014-08-28 LAB — IRON AND TIBC
Iron: 9 ug/dL — ABNORMAL LOW (ref 28–170)
SATURATION RATIOS: 4 % — AB (ref 10.4–31.8)
TIBC: 204 ug/dL — ABNORMAL LOW (ref 250–450)
UIBC: 195 ug/dL

## 2014-08-28 LAB — CBC WITH DIFFERENTIAL/PLATELET
Basophils Absolute: 0 10*3/uL (ref 0.0–0.1)
Basophils Relative: 0 % (ref 0–1)
EOS PCT: 1 % (ref 0–5)
Eosinophils Absolute: 0.1 10*3/uL (ref 0.0–0.7)
HCT: 25 % — ABNORMAL LOW (ref 36.0–46.0)
Hemoglobin: 7.7 g/dL — ABNORMAL LOW (ref 12.0–15.0)
Lymphocytes Relative: 12 % (ref 12–46)
Lymphs Abs: 1.8 10*3/uL (ref 0.7–4.0)
MCH: 28.7 pg (ref 26.0–34.0)
MCHC: 30.8 g/dL (ref 30.0–36.0)
MCV: 93.3 fL (ref 78.0–100.0)
MONO ABS: 1.3 10*3/uL — AB (ref 0.1–1.0)
Monocytes Relative: 9 % (ref 3–12)
NEUTROS PCT: 78 % — AB (ref 43–77)
Neutro Abs: 11.6 10*3/uL — ABNORMAL HIGH (ref 1.7–7.7)
Platelets: 731 10*3/uL — ABNORMAL HIGH (ref 150–400)
RBC: 2.68 MIL/uL — ABNORMAL LOW (ref 3.87–5.11)
RDW: 15.2 % (ref 11.5–15.5)
WBC: 14.8 10*3/uL — ABNORMAL HIGH (ref 4.0–10.5)

## 2014-08-28 LAB — LACTATE DEHYDROGENASE, PLEURAL OR PERITONEAL FLUID: LD, Fluid: 178 U/L — ABNORMAL HIGH (ref 3–23)

## 2014-08-28 LAB — RETICULOCYTES
RBC.: 2.68 MIL/uL — AB (ref 3.87–5.11)
RETIC CT PCT: 1.8 % (ref 0.4–3.1)
Retic Count, Absolute: 48.2 10*3/uL (ref 19.0–186.0)

## 2014-08-28 LAB — COMPREHENSIVE METABOLIC PANEL
ALT: 69 U/L — ABNORMAL HIGH (ref 14–54)
ANION GAP: 10 (ref 5–15)
AST: 44 U/L — AB (ref 15–41)
Albumin: 1.9 g/dL — ABNORMAL LOW (ref 3.5–5.0)
Alkaline Phosphatase: 102 U/L (ref 38–126)
BUN: 6 mg/dL (ref 6–20)
CO2: 21 mmol/L — ABNORMAL LOW (ref 22–32)
CREATININE: 0.5 mg/dL (ref 0.44–1.00)
Calcium: 7.8 mg/dL — ABNORMAL LOW (ref 8.9–10.3)
Chloride: 110 mmol/L (ref 101–111)
GFR calc Af Amer: >60 mL/min (ref >60)
GFR calc non Af Amer: >60 mL/min (ref >60)
Glucose, Bld: 86 mg/dL (ref 65–99)
Potassium: 3.8 mmol/L (ref 3.5–5.1)
SODIUM: 141 mmol/L (ref 135–145)
Total Bilirubin: 0.4 mg/dL (ref 0.3–1.2)
Total Protein: 6.1 g/dL — ABNORMAL LOW (ref 6.5–8.1)

## 2014-08-28 LAB — LEGIONELLA ANTIGEN, URINE

## 2014-08-28 LAB — PROTEIN, BODY FLUID: Total protein, fluid: 3.5 g/dL

## 2014-08-28 LAB — URINALYSIS, ROUTINE W REFLEX MICROSCOPIC
Bilirubin Urine: NEGATIVE
GLUCOSE, UA: NEGATIVE mg/dL
Hgb urine dipstick: NEGATIVE
Ketones, ur: NEGATIVE mg/dL
LEUKOCYTES UA: NEGATIVE
NITRITE: NEGATIVE
PROTEIN: NEGATIVE mg/dL
Specific Gravity, Urine: 1.007 (ref 1.005–1.030)
Urobilinogen, UA: 0.2 mg/dL (ref 0.0–1.0)
pH: 6 (ref 5.0–8.0)

## 2014-08-28 LAB — VITAMIN B12: Vitamin B-12: 592 pg/mL (ref 180–914)

## 2014-08-28 LAB — FERRITIN: Ferritin: 743 ng/mL — ABNORMAL HIGH (ref 11–307)

## 2014-08-28 LAB — ABO/RH: ABO/RH(D): A POS

## 2014-08-28 LAB — GLUCOSE, SEROUS FLUID: Glucose, Fluid: 59 mg/dL

## 2014-08-28 LAB — FOLATE: FOLATE: 28 ng/mL (ref >5.9)

## 2014-08-28 LAB — CLOSTRIDIUM DIFFICILE BY PCR: CDIFFPCR: NEGATIVE

## 2014-08-28 LAB — STREP PNEUMONIAE URINARY ANTIGEN: Strep Pneumo Urinary Antigen: NEGATIVE

## 2014-08-28 MED ORDER — ONDANSETRON HCL 4 MG/2ML IJ SOLN
4.0000 mg | Freq: Four times a day (QID) | INTRAMUSCULAR | Status: DC | PRN
  Administered 2014-08-28 – 2014-08-31 (×5): 4 mg via INTRAVENOUS
  Filled 2014-08-28 (×5): qty 2

## 2014-08-28 MED ORDER — HEPARIN SODIUM (PORCINE) 5000 UNIT/ML IJ SOLN
5000.0000 [IU] | Freq: Three times a day (TID) | INTRAMUSCULAR | Status: DC
  Administered 2014-08-28 – 2014-09-01 (×11): 5000 [IU] via SUBCUTANEOUS
  Filled 2014-08-28 (×15): qty 1

## 2014-08-28 MED ORDER — SACCHAROMYCES BOULARDII 250 MG PO CAPS
250.0000 mg | ORAL_CAPSULE | Freq: Two times a day (BID) | ORAL | Status: DC
  Administered 2014-08-28 – 2014-09-01 (×9): 250 mg via ORAL
  Filled 2014-08-28 (×10): qty 1

## 2014-08-28 MED ORDER — BOOST / RESOURCE BREEZE PO LIQD
1.0000 | Freq: Two times a day (BID) | ORAL | Status: DC
  Administered 2014-08-28 – 2014-08-29 (×2): 1 via ORAL

## 2014-08-28 NOTE — Progress Notes (Signed)
Left lower extremity venous duplex completed.  Left:  No evidence of DVT, superficial thrombosis, or Baker's cyst.  Right:  Negative for DVT in the common femoral vein.  

## 2014-08-28 NOTE — Progress Notes (Signed)
Initial Nutrition Assessment  DOCUMENTATION CODES:  Non-severe (moderate) malnutrition in context of chronic illness  INTERVENTION: - Will order Boost Breeze BID -RD to continue to monitor for needs  NUTRITION DIAGNOSIS:  Unintentional weight loss related to other (see comment) (lack of appetite and decreased intakes) as evidenced by per patient/family report, percent weight loss.  GOAL:  Patient will meet greater than or equal to 90% of their needs  MONITOR:  PO intake, Supplement acceptance, Weight trends, Labs, I & O's  REASON FOR ASSESSMENT:  Malnutrition Screening Tool  ASSESSMENT: Per H&P, Presented 2 weeks after discharge to home with symptoms of fatigue. Patient was evaluated by her PCP who will obtain chest x-ray showing worrisome for new infiltrate. Patient reports feeling better for about a week after discharge but she started to get weak. She fell down using a cane it was not a violent fall. She went back to using walker. She continued to decompensate.  Pt seen for MST. BMI indicates overweight status. Pt ate 75% of breakfast and reports she ate ~50% of roast beef and mashed potatoes for lunch. She states that since 07/27/2014 her appetite has decreased and she has lost 10 lbs (7% body weight) in this time frame. She indicates that she has been on several medications and has had a metallic taste in her mouth which is not improved by anything. She also reports ongoing nausea; she hopes that when she eats it will go away but it has shown to be consistent. Pt was not drinking nutrition supplements PTA.  Physical assessment did not show muscle or fat wasting. Pt likely meeting minimal needs; will order Boost Breeze to supplement. Labs and medications reviewed.   Height:  Ht Readings from Last 1 Encounters:  08/27/14 4\' 11"  (1.499 m)    Weight:  Wt Readings from Last 1 Encounters:  08/27/14 129 lb 10.1 oz (58.8 kg)    Ideal Body Weight:  44.1 kg (kg)  Wt Readings  from Last 10 Encounters:  08/27/14 129 lb 10.1 oz (58.8 kg)  08/13/14 130 lb 11.7 oz (59.3 kg)  12/05/12 132 lb (59.875 kg)  10/17/12 138 lb (62.596 kg)  04/19/12 137 lb 9.6 oz (62.415 kg)  01/24/12 140 lb (63.504 kg)  01/23/12 142 lb (64.411 kg)  12/07/11 140 lb 12.8 oz (63.866 kg)  03/11/10 139 lb (63.05 kg)    BMI:  Body mass index is 26.17 kg/(m^2).  Estimated Nutritional Needs:  Kcal:  1300-1500  Protein:  55-65 grams  Fluid:  2.5 L/day  Skin:  Reviewed, no issues  Diet Order:  Diet Heart Room service appropriate?: Yes; Fluid consistency:: Thin  EDUCATION NEEDS:  No education needs identified at this time   Intake/Output Summary (Last 24 hours) at 08/28/14 1329 Last data filed at 08/28/14 0815  Gross per 24 hour  Intake    240 ml  Output    751 ml  Net   -511 ml    Last BM:  PTA   Jarome Matin, RD, LDN Inpatient Clinical Dietitian Pager # [phone] After hours/weekend pager # [phone]

## 2014-08-28 NOTE — Progress Notes (Signed)
[facility] PROGRESS NOTE  [name] VVO:[id number] DOB: 1941/03/28 DOA: 08/27/2014 PCP: Mayra Neer, MD  Brief Summary  The patient is a 74 year old female with history of asthma, arthritis, and breast cancer, hearing loss. She was recently hospitalized with pneumonia and empyema with a cavitary like lesion. She was seen by pulmonology and had a thoracentesis with removal of a liter of purulent fluid. She was treated with Zosyn and discharged home on Augmentin. She had progressive fatigue and re-presented to the emergency department with sepsis. She was started on broad-spectrum antibiotic and pulmonology was consulted for assistance.  Assessment/Plan  Sepsis and acute hypoxic respiratory failure secondary to healthcare associated pneumonia with empyema  - appreciate pulmonology assistance -  continue vancomycin and Zosyn  -  She underwent thoracentesis on 6/2 by radiology with removal of 350 cc of turbid yellow fluid from the right side  -  Consider CT surgery evaluation for VATS, however I will defer to pulmonology  -  Follow-up strep pneumo and Legionella antigen -  F/u effusion labs -  Previous cytology negative for malignancy  Asthma, stable, continue Symbicort, when necessary albuterol, Flonase, Allegr  Normocytic anemia with low iron levels but high ferritin levels consistent with anemia of chronic inflammation or malignancy. Her vitamin B12 and folate levels were within normal limits. TSH was 1.175 and she was occult stool negative. - Transfuse for hemoglobin of 7 or less or signs or symptomatically anemia  Diffuse lymphadenopathy, no atypical cells on smear -  HIV nonreactive -  LDH  -  Follow with hematology/oncology pending resolution of current infection which may be exacerbating her lymphadenopathy  Leukocytosis and thrombocytosis are likely reactive to her underlying infection, particularly since she has a left shift -  Trend white blood cell, hemoglobin,  white limits  Diarrhea, likely side effect of antibiotics -  C. Diff negative -  Continue florastor  Diet:  Healthy heart cess:  PIV IVF:  Off Proph: heparin  Code Status: Full Family Communication: patient and her husband Disposition Plan: pending possible further treatment of her loculated effusion, culture data.     Consultants:Pulmonology   Procedures chest x-ray, ultrasound guided thoracentesis of the right side on 6/2  Repeat chest x-ray  Lower extremity venous duplex on 6/2, negative for DVT  Antibiotics:  Vancomycin on 6/1  Zosyn 6/1   HPI/Subjective:  Still has pain from her recent thoracentesis.  Had some diarrhea earlier but none since returning from thoracentesis.  Persistent fevers and cough.    Objective: Filed Vitals:   08/27/14 2342 08/28/14 0601 08/28/14 0827 08/28/14 1510  BP:  150/60  140/57  Pulse:  111    Temp:  100.7 F (38.2 C)    TempSrc:  Oral    Resp:  20    Height:      Weight:      SpO2: 94% 94% 93%     Intake/Output Summary (Last 24 hours) at 08/28/14 1606 Last data filed at 08/28/14 0815  Gross per 24 hour  Intake    240 ml  Output    751 ml  Net   -511 ml   Filed Weights   08/27/14 2228  Weight: 58.8 kg (129 lb 10.1 oz)    Exam:   General:  Thin F, No acute distress  HEENT:  NCAT, MMM  Cardiovascular:  RRR, nl S1, S2 no mrg, 2+ pulses, warm extremities  Respiratory:  Course rhonchorous breath sounds and diminished breath sounds at the right base to  the mid-back, no rales, rhonchi or wheeze on the left side, mild tachypnea  Abdomen:   NABS, soft, NT/ND  MSK:   Normal tone and bulk, 1+ pitting bilateral lower extremity edema  Neuro:  Grossly intact  Data Reviewed: Basic Metabolic Panel:  Recent Labs Lab 08/27/14 1834 08/28/14 0535  NA 138 141  K 3.1* 3.8  CL 107 110  CO2 24 21*  GLUCOSE 94 86  BUN 8 6  CREATININE 0.43* 0.50  CALCIUM 8.2* 7.8*   Liver Function Tests:  Recent Labs Lab  08/27/14 1834 08/28/14 0535  AST 65* 44*  ALT 87* 69*  ALKPHOS 106 102  BILITOT 0.2* 0.4  PROT 6.4* 6.1*  ALBUMIN 1.9* 1.9*   No results for input(s): LIPASE, AMYLASE in the last 168 hours. No results for input(s): AMMONIA in the last 168 hours. CBC:  Recent Labs Lab 08/27/14 1834 08/27/14 2015 08/28/14 0535  WBC 13.3* 14.5* 14.8*  NEUTROABS 8.8* 9.9* 11.6*  HGB 7.8* 7.6* 7.7*  HCT 25.0* 24.1* 25.0*  MCV 91.9 90.6 93.3  PLT 655* 573* 731*   Cardiac Enzymes: No results for input(s): CKTOTAL, CKMB, CKMBINDEX, TROPONINI in the last 168 hours. BNP (last 3 results)  Recent Labs  08/05/14 0007  BNP 519.1*    ProBNP (last 3 results) No results for input(s): PROBNP in the last 8760 hours.  CBG: No results for input(s): GLUCAP in the last 168 hours.  No results found for this or any previous visit (from the past 240 hour(s)).   Studies: Dg Chest 1 View  08/28/2014   CLINICAL DATA:  Status post right side thoracentesis.  EXAM: CHEST  1 VIEW  COMPARISON:  08/28/2014  FINDINGS: Heart size and mediastinal contours appear normal. There is a persistent right pleural effusion. No pneumothorax after thoracentesis. Persistent bilateral lower lobe airspace opacities.  IMPRESSION: 1. No pneumothorax after right thoracentesis. 2. Persistent bilateral lower lobe airspace opacities.   Electronically Signed   By: Kerby Moors M.D.   On: 08/28/2014 15:51   Dg Chest 2 View  08/27/2014   CLINICAL DATA:  Recent pneumonia with persistent difficulty breathing and cough  EXAM: CHEST  2 VIEW  COMPARISON:  Chest radiograph and chest CT Aug 09, 2014  FINDINGS: There has been interval clearing of infiltrate from much of the left lung. There remains patchy infiltrate in the left base. On the right, however, there is new airspace consolidation in the right middle lobe. Left upper lobe infiltrate has cleared since prior study. There is a small right effusion. Heart size and pulmonary vascularity are  normal. No adenopathy.  IMPRESSION: In comparison with recent prior studies, there is new airspace consolidation in the right middle lobe. There is stable patchy infiltrate in the left base. There is a small right effusion. Elsewhere, lungs are clear; there has been significant upper lobe clearing compared to recent prior studies. No change in cardiac silhouette.   Electronically Signed   By: Lowella Grip III M.D.   On: 08/27/2014 15:14   Dg Chest Bilateral Decubitus  08/28/2014   CLINICAL DATA:  Evaluate for pleural effusions, recent pneumonia  EXAM: CHEST - BILATERAL DECUBITUS VIEW  COMPARISON:  Chest radiographs dated 08/27/2014  FINDINGS: Patchy right lower lobe opacity, suspicious for pneumonia.  Layering right pleural effusion.  No pneumothorax.  The heart is normal in size.  IMPRESSION: Patchy right lower lobe opacity, suspicious for pneumonia.  Layering right pleural effusion.   Electronically Signed   By: Bertis Ruddy  [place] Pink M.D.   On: 08/28/2014 11:27    Scheduled Meds: . budesonide-formoterol  2 puff Inhalation BID  . feeding supplement (RESOURCE BREEZE)  1 Container Oral BID BM  . fluticasone  1-2 spray Each Nare QHS  . loratadine  10 mg Oral Daily  . piperacillin-tazobactam (ZOSYN)  IV  3.375 g Intravenous Q8H  . raloxifene  60 mg Oral Q breakfast  . saccharomyces boulardii  250 mg Oral BID  . sodium chloride  3 mL Intravenous Q12H  . vancomycin  750 mg Intravenous Q12H   Continuous Infusions:   Active Problems:   Sepsis   Hypokalemia   HCAP (healthcare-associated pneumonia)   Malnutrition of moderate degree    Time spent: 30 min    Zaron Zwiefelhofer, [place] [facility] Pager [phone]. If 7PM-7AM, please contact night-coverage at [email or web address], password [facility] 08/28/2014, 4:06 PM  LOS: 1 day

## 2014-08-28 NOTE — Procedures (Addendum)
US guided diagnostic/therapeutic right thoracentesis performed yielding 350 cc's turbid, yellow fluid. The fluid was sent to the lab for preordered studies. Collection was small and multiloculated. Only the above amount of fluid  could be removed at this time. F/u CXR pending. No immediate complications.

## 2014-08-28 NOTE — Consult Note (Signed)
Name: [name] MRN: [id number] DOB: 10/25/1940    ADMISSION DATE:  08/27/2014 CONSULTATION DATE:  08/28/14  REFERRING MD :  Dr. Sheran Fava   CHIEF COMPLAINT:  Possible Recurrent PNA   BRIEF PATIENT DESCRIPTION: 74 y/o F  SIGNIFICANT EVENTS  5/7-5/18  Admit for acute hypoxic respiratory failure 2/2 CAP & associated R exudative pleural effusion 6/01  Admit with possible new vs resolving R PNA  STUDIES:     HISTORY OF PRESENT ILLNESS:  74 y/o F with PMH of breast cancer s/p mastectomy with reconstruction, macular degeneration, hearing loss, asthma and recent admission from 5/7-5/18 for acute hypoxic respiratory failure secondary to CAP (NOS), acute lung injury and an associated exudative pleural effusion / empyema  who presented to [facility] on 6/1 after being seen at her primary care office for hospital follow up.  CXR at that time was concerning for fluid re-accumulation and patient was sent for evaluation.   Patient reports she felt weak after discharge on 5/18 but initially was making slow improvements.  She was working with physical therapy and had progressed to using a cane for ambulation.  She did have a mechanical fall after working particularly hard with PT.  She reports she was taking a step down and her leg didn't hold her weight and she sat down on the steps (denied acute injury).  She noted ongoing mild dyspnea when moving around the house.  She reports an occasional cough but no significant sputum production.  The patient endorses decreased appetite, poor intake, diarrhea (since before the first admit on 5/7, prior C-diff PCR negative), and occasional subjective fevers.  Previously, she had R chest pain associated with PNA but this has now resolved.    Prior admission, she was seen by ID and recommended to complete 21 days of antibiotics (d/c'd on Augmentin).  All cultures at that time were negative.  CT at that time noted an area of cavitation in RLL and incidental finding of mesenteric  lymphadenopathy.  ECHO showed and EF of 50-55%.    Initial evaluation noted the patient to meet sepsis criteria with suspected new R infiltrate.  The patient was admitted per [facility] for further evaluation.  The patient was hydrated, treated with vancomycin and zosyn for HCAP.  PCCM consulted for evaluation.      PAST MEDICAL HISTORY :   has a past medical history of Asthma; Macular degeneration; Hearing loss; Osteopenia; Arthritis; and Cancer of breast.  has past surgical history that includes Abdominal hysterectomy; Cesarean section; Breast enhancement surgery; left heart catheterization with coronary angiogram (N/A, 01/24/2012); Mastectomy (33 yrs ago); Eye surgery (Bilateral, march 2015 and june 2015); and Colonoscopy with propofol (N/A, 04/21/2014).    Prior to Admission medications   Medication Sig Start Date End Date Taking? Authorizing Provider  albuterol (PROAIR HFA) 108 (90 BASE) MCG/ACT inhaler Inhale 2 puffs into the lungs every 6 (six) hours as needed for wheezing. 04/19/12  Yes [name], MD  albuterol (PROVENTIL) (2.5 MG/3ML) 0.083% nebulizer solution Take 3 mLs (2.5 mg total) by nebulization every 6 (six) hours as needed. DX 493.90 04/26/12  Yes [name], MD  azelastine (ASTELIN) 137 MCG/SPRAY nasal spray Place 1 spray into the nose at bedtime. Use in each nostril as directed   Yes Historical Provider, MD  budesonide-formoterol (SYMBICORT) 80-4.5 MCG/ACT inhaler Inhale 2 puffs into the lungs 2 (two) times daily.   Yes Historical Provider, MD  calcium carbonate (TUMS) 500 MG chewable tablet Chew 1 tablet  by mouth at bedtime.    Yes Historical Provider, MD  Cholecalciferol (VITAMIN D) 1000 UNITS capsule Take 1,000 Units by mouth daily with breakfast.    Yes Historical Provider, MD  fexofenadine (ALLEGRA) 180 MG tablet Take 180 mg by mouth daily with breakfast.    Yes Historical Provider, MD  fluticasone (FLONASE) 50 MCG/ACT nasal spray Place 1-2 sprays into both nostrils  at bedtime.    Yes Historical Provider, MD  meloxicam (MOBIC) 15 MG tablet Take 15 mg by mouth daily.   Yes Historical Provider, MD  Multiple Vitamins-Minerals (MULTI FOR HER 50+) TABS Take 1 tablet by mouth daily.   Yes Historical Provider, MD  Multiple Vitamins-Minerals (PRESERVISION/LUTEIN) CAPS Take 1 capsule by mouth 2 (two) times daily.   Yes Historical Provider, MD  Omega-3 Fatty Acids (FISH OIL) 1200 MG CAPS Take 1,200 mg by mouth at bedtime.   Yes Historical Provider, MD  raloxifene (EVISTA) 60 MG tablet Take 60 mg by mouth daily with breakfast.    Yes Historical Provider, MD  amoxicillin-clavulanate (AUGMENTIN XR) 1000-62.5 MG per tablet Take 2 tablets by mouth every 12 (twelve) hours. Patient not taking: Reported on 08/27/2014 08/13/14   Venetia Maxon Rama, MD  guaiFENesin-dextromethorphan (ROBITUSSIN DM) 100-10 MG/5ML syrup Take 5 mLs by mouth every 4 (four) hours as needed for cough. Patient not taking: Reported on 08/27/2014 08/13/14   Venetia Maxon Rama, MD  predniSONE (DELTASONE) 20 MG tablet Take 2 tablets (40 mg total) by mouth daily with breakfast. Patient not taking: Reported on 08/27/2014 08/13/14   Venetia Maxon Rama, MD   Allergies  Allergen Reactions  . Fosamax [Alendronate Sodium] Nausea Only  . Sulfonamide Derivatives Rash    FAMILY HISTORY:  family history includes Heart attack in her father.   SOCIAL HISTORY:  reports that she has never smoked. She has never used smokeless tobacco. She reports that she drinks alcohol. She reports that she does not use illicit drugs.  REVIEW OF SYSTEMS:   Constitutional: Negative for chills, weight loss, and diaphoresis. Reports subjective fevers, chills, fatigue.  HENT: Negative for hearing loss, ear pain, nosebleeds, congestion, sore throat, neck pain, tinnitus and ear discharge.   Eyes: Negative for blurred vision, double vision, photophobia, pain, discharge and redness.  Respiratory: Negative for hemoptysis, sputum production, wheezing  and stridor.  Reports non-productive cough, DOE  Cardiovascular: Negative for chest pain, palpitations, orthopnea, claudication, leg swelling and PND.  Gastrointestinal: Negative for heartburn, nausea, vomiting, abdominal pain, diarrhea, constipation, blood in stool and melena.  Genitourinary: Negative for dysuria, urgency, frequency, hematuria and flank pain.  Musculoskeletal: Negative for myalgias, back pain, joint pain and falls.  Skin: Negative for itching and rash.  Neurological: Negative for dizziness, tingling, tremors, sensory change, speech change, focal weakness, seizures, loss of consciousness, weakness and headaches.  Endo/Heme/Allergies: Negative for environmental allergies and polydipsia. Does not bruise/bleed easily.  SUBJECTIVE: Pt reports feeling better since admit, no acute complaints   VITAL SIGNS: Temp:  [98.3 F (36.8 C)-100.9 F (38.3 C)] 100.7 F (38.2 C) (06/02 0601) Pulse Rate:  [99-112] 111 (06/02 0601) Resp:  [19-29] 20 (06/02 0601) BP: (117-150)/(51-62) 150/60 mmHg (06/02 0601) SpO2:  [93 %-94 %] 93 % (06/02 0827) Weight:  [129 lb 10.1 oz (58.8 kg)] 129 lb 10.1 oz (58.8 kg) (06/01 2228)  PHYSICAL EXAMINATION: General:  wdwn adult female in NAD  Neuro:  AAOx4, speech clear, MAE, no deficits  HEENT:  MM pink/moist, no jvd, no sclera icterus  Cardiovascular:  s1s2 rrr,  no m/r/g  Lungs:  Even/non-labored on RA, left clear, diminished lower on R with few crackles  Abdomen:  NTND, BSx4 active Musculoskeletal:  No acute deformities  Skin:  Warm/dry, no edema    Recent Labs Lab 08/27/14 1834 08/28/14 0535  NA 138 141  K 3.1* 3.8  CL 107 110  CO2 24 21*  BUN 8 6  CREATININE 0.43* 0.50  GLUCOSE 94 86    Recent Labs Lab 08/27/14 1834 08/27/14 2015 08/28/14 0535  HGB 7.8* 7.6* 7.7*  HCT 25.0* 24.1* 25.0*  WBC 13.3* 14.5* 14.8*  PLT 655* 573* 731*   Dg Chest 2 View  08/27/2014   CLINICAL DATA:  Recent pneumonia with persistent difficulty  breathing and cough  EXAM: CHEST  2 VIEW  COMPARISON:  Chest radiograph and chest CT Aug 09, 2014  FINDINGS: There has been interval clearing of infiltrate from much of the left lung. There remains patchy infiltrate in the left base. On the right, however, there is new airspace consolidation in the right middle lobe. Left upper lobe infiltrate has cleared since prior study. There is a small right effusion. Heart size and pulmonary vascularity are normal. No adenopathy.  IMPRESSION: In comparison with recent prior studies, there is new airspace consolidation in the right middle lobe. There is stable patchy infiltrate in the left base. There is a small right effusion. Elsewhere, lungs are clear; there has been significant upper lobe clearing compared to recent prior studies. No change in cardiac silhouette.   Electronically Signed   By: Lowella Grip III M.D.   On: 08/27/2014 15:14    ASSESSMENT / PLAN:   RML Infiltrate - new infiltrate vs resolving PNA vs atelectasis / scarring  Plan: Continue current ABX - Vanco/Zosyn, D2/x  Intermittent CXR May need CT chest for further evaluation of infiltrate / effusion.  Await CXR results.  Trend PCT  Oxygen if needed to maintain saturations > 92% Pulmonary hygiene: IS, mobilize as able  Pleural Effusion (R) - appears to be small volume on CXR Hx Empyema - s/p thora in early May  Plan: Assess R lateral decubitus to determine if fluid layers.   If pleural fluid does layer, will ask IR to perform thoracentesis   Asthma - without acute exacerbation   Plan: Continue claritin, symbicort, flonase   Sepsis Volume Depletion   Plan: Gentle volume resuscitation  Follow fever curve / WBC PCT as above   Diarrhea   Plan: Assess C-Diff PCR Add florastor   Mesenteric LAN   Plan: Defer to primary SVC   Noe Gens, NP-C  Pulmonary & Critical Care Pgr: [phone] or [phone] 08/28/2014, 9:52 AM

## 2014-08-29 DIAGNOSIS — J90 Pleural effusion, not elsewhere classified: Secondary | ICD-10-CM | POA: Insufficient documentation

## 2014-08-29 DIAGNOSIS — J869 Pyothorax without fistula: Secondary | ICD-10-CM

## 2014-08-29 LAB — BASIC METABOLIC PANEL
Anion gap: 6 (ref 5–15)
CO2: 24 mmol/L (ref 22–32)
Calcium: 7.2 mg/dL — ABNORMAL LOW (ref 8.9–10.3)
Chloride: 109 mmol/L (ref 101–111)
Creatinine, Ser: 0.54 mg/dL (ref 0.44–1.00)
GFR calc Af Amer: >60 mL/min (ref >60)
GFR calc non Af Amer: >60 mL/min (ref >60)
Glucose, Bld: 92 mg/dL (ref 65–99)
Potassium: 3.1 mmol/L — ABNORMAL LOW (ref 3.5–5.1)
Sodium: 139 mmol/L (ref 135–145)

## 2014-08-29 LAB — CBC
HCT: 22.7 % — ABNORMAL LOW (ref 36.0–46.0)
Hemoglobin: 7 g/dL — ABNORMAL LOW (ref 12.0–15.0)
MCH: 28.6 pg (ref 26.0–34.0)
MCHC: 30.8 g/dL (ref 30.0–36.0)
MCV: 92.7 fL (ref 78.0–100.0)
PLATELETS: 706 10*3/uL — AB (ref 150–400)
RBC: 2.45 MIL/uL — ABNORMAL LOW (ref 3.87–5.11)
RDW: 15.2 % (ref 11.5–15.5)
WBC: 13.8 10*3/uL — AB (ref 4.0–10.5)

## 2014-08-29 LAB — PROCALCITONIN: Procalcitonin: 0.46 ng/mL

## 2014-08-29 LAB — URINE CULTURE
CULTURE: NO GROWTH
Colony Count: NO GROWTH

## 2014-08-29 LAB — PREPARE RBC (CROSSMATCH)

## 2014-08-29 LAB — PATHOLOGIST SMEAR REVIEW

## 2014-08-29 LAB — OCCULT BLOOD X 1 CARD TO LAB, STOOL: Fecal Occult Bld: NEGATIVE

## 2014-08-29 MED ORDER — SODIUM CHLORIDE 0.9 % IV SOLN
Freq: Once | INTRAVENOUS | Status: AC
  Administered 2014-08-29: 08:00:00 via INTRAVENOUS

## 2014-08-29 MED ORDER — POTASSIUM CHLORIDE CRYS ER 20 MEQ PO TBCR
40.0000 meq | EXTENDED_RELEASE_TABLET | Freq: Once | ORAL | Status: AC
  Administered 2014-08-29: 40 meq via ORAL
  Filled 2014-08-29: qty 2

## 2014-08-29 NOTE — Progress Notes (Addendum)
Name: [name] MRN: [id number] DOB: March 03, 1941    ADMISSION DATE:  08/27/2014 CONSULTATION DATE:  08/28/14  REFERRING MD :  Dr. Sheran Fava   CHIEF COMPLAINT:  Possible Recurrent PNA   BRIEF PATIENT DESCRIPTION: 74 y/o F presented to [facility] on 6/1 after being seen at her primary care office for hospital follow up.  CXR at that time was concerning for fluid re-accumulation and patient was sent for evaluation.  Prior admission, she was seen by ID and recommended to complete 21 days of antibiotics (d/c'd on Augmentin).  All cultures at that time were negative.  CT at that time noted an area of cavitation in RLL and incidental finding of mesenteric lymphadenopathy.  ECHO showed and EF of 50-55%.      PMH breast cancer s/p mastectomy with reconstruction, macular degeneration, hearing loss, asthma SIGNIFICANT EVENTS  5/7-5/18  Admit for acute hypoxic respiratory failure 2/2 CAP & associated R exudative pleural effusion 6/01  Admit with possible new vs resolving R PNA  STUDIES:    SUBJECTIVE: Feels much better Afebrile Loose stools x 2  VITAL SIGNS: Temp:  [98 F (36.7 C)-98.4 F (36.9 C)] 98.2 F (36.8 C) (06/03 0548) Pulse Rate:  [99-101] 100 (06/03 0548) Resp:  [20-22] 20 (06/03 0548) BP: (112-140)/(50-57) 112/50 mmHg (06/03 0548) SpO2:  [92 %-94 %] 93 % (06/03 0812)  PHYSICAL EXAMINATION: General:  wdwn adult female in NAD  Neuro:  AAOx4, speech clear, MAE, no deficits  HEENT:  MM pink/moist, no jvd, no sclera icterus  Cardiovascular:  s1s2 rrr, no m/r/g  Lungs:  Even/non-labored on RA, left clear, diminished lower on R with few crackles  Abdomen:  NTND, BSx4 active Musculoskeletal:  No acute deformities  Skin:  Warm/dry, no edema    Recent Labs Lab 08/27/14 1834 08/28/14 0535 08/29/14 0520  NA 138 141 139  K 3.1* 3.8 3.1*  CL 107 110 109  CO2 24 21* 24  BUN 8 6 <5*  CREATININE 0.43* 0.50 0.54  GLUCOSE 94 86 92    Recent Labs Lab 08/27/14 2015 08/28/14 0535  08/29/14 0520  HGB 7.6* 7.7* 7.0*  HCT 24.1* 25.0* 22.7*  WBC 14.5* 14.8* 13.8*  PLT 573* 731* 706*   Dg Chest 1 View  08/28/2014   CLINICAL DATA:  Status post right side thoracentesis.  EXAM: CHEST  1 VIEW  COMPARISON:  08/28/2014  FINDINGS: Heart size and mediastinal contours appear normal. There is a persistent right pleural effusion. No pneumothorax after thoracentesis. Persistent bilateral lower lobe airspace opacities.  IMPRESSION: 1. No pneumothorax after right thoracentesis. 2. Persistent bilateral lower lobe airspace opacities.   Electronically Signed   By: Kerby Moors M.D.   On: 08/28/2014 15:51   Dg Chest 2 View  08/27/2014   CLINICAL DATA:  Recent pneumonia with persistent difficulty breathing and cough  EXAM: CHEST  2 VIEW  COMPARISON:  Chest radiograph and chest CT Aug 09, 2014  FINDINGS: There has been interval clearing of infiltrate from much of the left lung. There remains patchy infiltrate in the left base. On the right, however, there is new airspace consolidation in the right middle lobe. Left upper lobe infiltrate has cleared since prior study. There is a small right effusion. Heart size and pulmonary vascularity are normal. No adenopathy.  IMPRESSION: In comparison with recent prior studies, there is new airspace consolidation in the right middle lobe. There is stable patchy infiltrate in the left base. There is a small right effusion.  Elsewhere, lungs are clear; there has been significant upper lobe clearing compared to recent prior studies. No change in cardiac silhouette.   Electronically Signed   By: Lowella Grip III M.D.   On: 08/27/2014 15:14   Dg Chest Bilateral Decubitus  08/28/2014   CLINICAL DATA:  Evaluate for pleural effusions, recent pneumonia  EXAM: CHEST - BILATERAL DECUBITUS VIEW  COMPARISON:  Chest radiographs dated 08/27/2014  FINDINGS: Patchy right lower lobe opacity, suspicious for pneumonia.  Layering right pleural effusion.  No pneumothorax.  The heart  is normal in size.  IMPRESSION: Patchy right lower lobe opacity, suspicious for pneumonia.  Layering right pleural effusion.   Electronically Signed   By: Julian Hy M.D.   On: 08/28/2014 11:27   US Thoracentesis Asp Pleural Space W/img Guide  08/28/2014   CLINICAL DATA:  History of breast cancer, pneumonia, recurrent right pleural effusion. Request is made for diagnostic and therapeutic right thoracentesis.  EXAM: ULTRASOUND GUIDED DIAGNOSTIC AND THERAPEUTIC RIGHT THORACENTESIS  COMPARISON:  None.  PROCEDURE: An ultrasound guided thoracentesis was thoroughly discussed with the patient and questions answered. The benefits, risks, alternatives and complications were also discussed. The patient understands and wishes to proceed with the procedure. Written consent was obtained.  Ultrasound was performed to localize and mark an adequate pocket of fluid in the right chest. The area was then prepped and draped in the normal sterile fashion. 1% Lidocaine was used for local anesthesia. Under ultrasound guidance a 19 gauge Yueh catheter was introduced. Thoracentesis was performed. The catheter was removed and a dressing applied.  COMPLICATIONS: None.  FINDINGS: A total of approximately 350 cc's of turbid, yellow fluid was removed. The fluid sample was sent for laboratory analysis.  IMPRESSION: Successful ultrasound guided diagnostic and therapeutic right thoracentesis yielding 350 cc's of pleural fluid.  Read by:  Nash Mantis   Electronically Signed   By: Lucrezia Europe M.D.   On: 08/28/2014 15:55    ASSESSMENT / PLAN:   RML Infiltrate - new infiltrate vs resolving PNA vs atelectasis / scarring  Plan: Continue current ABX - Vanco/Zosyn, D2/x   IS, mobilize as able  Pleural Effusion (R) - appears to be small volume on CXR Hx Empyema - s/p thora in early May, rpt thora 6/2 >> 400cc loculations noted  Low pct reassuring, lymphocytic nature of fluid reassuring, fever curve / WBC improved Plan: Doubt  need for chest tube or surgery Would ct abx for 7ds total   Asthma - without acute exacerbation   Plan: Continue claritin, symbicort, flonase    Diarrhea  -abx associated C-Diff PCR neg Plan:  Add florastor   Mesenteric LAN   Plan: Defer to primary SVC   FU appt made in 1 wk with CXR  Kara Mead MD. [facility]. [place] Pulmonary & Critical care Pager [phone] If no response call [phone]    08/29/2014, 10:44 AM

## 2014-08-29 NOTE — Progress Notes (Signed)
[facility]  Patient Status: Active (receiving services up to time of hospitalization)  [facility] is providing the following services: RN and PT  If patient discharges after hours, please call [phone].   [name] 08/29/2014, 1:15 PM

## 2014-08-29 NOTE — Progress Notes (Signed)
[facility] PROGRESS NOTE  [name] XTK:[id number] DOB: 07-11-1940 DOA: 08/27/2014 PCP: Mayra Neer, MD  Brief Summary  The patient is a 74 year old female with history of asthma, arthritis, and breast cancer, hearing loss. She was recently hospitalized with pneumonia and empyema with a cavitary like lesion. She was seen by pulmonology and had a thoracentesis with removal of a liter of purulent fluid. She was treated with Zosyn and discharged home on Augmentin. She had progressive fatigue and re-presented to the emergency department with sepsis. She was started on broad-spectrum antibiotic and pulmonology was consulted for assistance.  Assessment/Plan  Sepsis and acute hypoxic respiratory failure secondary to healthcare associated pneumonia with empyema, resolving - appreciate pulmonology assistance -  continue vancomycin and Zosyn  -  She underwent thoracentesis on 6/2 by radiology with removal of 350 cc of turbid yellow fluid from the right side  -  No need for VATS -  Strep pneumo and Legionella antigen neg -  Exudative effusion, however, more lymphocytic this time  -  Previous cytology negative for malignancy  Asthma, stable, continue Symbicort, when necessary albuterol, Flonase, Allegr  Normocytic anemia with low iron levels but high ferritin levels consistent with anemia of chronic inflammation or malignancy.  -  Her vitamin B12 and folate levels were within normal limits.  -  TSH was 1.175 and she was occult stool negative. -  Transfuse 1 unit PRBC  Diffuse lymphadenopathy, no atypical cells on smear -  HIV nonreactive -  LDH  -  Follow with hematology/oncology pending resolution of current infection which may be exacerbating her lymphadenopathy  Leukocytosis and thrombocytosis are likely reactive to her underlying infection, particularly since she has a left shift -  Trend white blood cell, hemoglobin, white limits  Diarrhea, likely side effect of antibiotics  and not watery just soft -  C. Diff negative -  Continue florastor  Diet:  Healthy heart cess:  PIV IVF:  Off Proph: heparin  Code Status: Full Family Communication: patient and her husband Disposition Plan: pending possible further treatment of her loculated effusion, culture data.     Consultants:  Pulmonology   Procedures   chest x-ray, ultrasound guided thoracentesis of the right side on 6/2  Repeat chest x-ray  Lower extremity venous duplex on 6/2, negative for DVT  Antibiotics:  Vancomycin on 6/1  Zosyn 6/1   HPI/Subjective:  Feeling better.  Denies nausea, vomiting.  No watery diarrhea.  Mild cough but not SOB  Objective: Filed Vitals:   08/29/14 1400 08/29/14 1430 08/29/14 1530 08/29/14 1625  BP: 110/52 107/54 124/58 117/71  Pulse: 92 94 100 100  Temp: 98 F (36.7 C) 98.4 F (36.9 C) 98.4 F (36.9 C) 98.3 F (36.8 C)  TempSrc: Oral Oral Oral   Resp: 18 18 18 20   Height:      Weight:      SpO2: 94% 93% 94% 94%    Intake/Output Summary (Last 24 hours) at 08/29/14 1750 Last data filed at 08/29/14 1625  Gross per 24 hour  Intake    770 ml  Output      1 ml  Net    769 ml   Filed Weights   08/27/14 2228  Weight: 58.8 kg (129 lb 10.1 oz)    Exam:   General:  Thin F, No acute distress  HEENT:  NCAT, MMM  Cardiovascular:  RRR, nl S1, S2 no mrg, 2+ pulses, warm extremities  Respiratory:  Course rales and diminished breath sounds  at the right base, no rales, rhonchi or wheeze on the left side, mild tachypnea  Abdomen:   NABS, soft, NT/ND  MSK:   Normal tone and bulk, 1+ pitting bilateral lower extremity edema  Neuro:  Grossly intact  Data Reviewed: Basic Metabolic Panel:  Recent Labs Lab 08/27/14 1834 08/28/14 0535 08/29/14 0520  NA 138 141 139  K 3.1* 3.8 3.1*  CL 107 110 109  CO2 24 21* 24  GLUCOSE 94 86 92  BUN 8 6 <5*  CREATININE 0.43* 0.50 0.54  CALCIUM 8.2* 7.8* 7.2*   Liver Function Tests:  Recent Labs Lab  08/27/14 1834 08/28/14 0535  AST 65* 44*  ALT 87* 69*  ALKPHOS 106 102  BILITOT 0.2* 0.4  PROT 6.4* 6.1*  ALBUMIN 1.9* 1.9*   No results for input(s): LIPASE, AMYLASE in the last 168 hours. No results for input(s): AMMONIA in the last 168 hours. CBC:  Recent Labs Lab 08/27/14 1834 08/27/14 2015 08/28/14 0535 08/29/14 0520  WBC 13.3* 14.5* 14.8* 13.8*  NEUTROABS 8.8* 9.9* 11.6*  --   HGB 7.8* 7.6* 7.7* 7.0*  HCT 25.0* 24.1* 25.0* 22.7*  MCV 91.9 90.6 93.3 92.7  PLT 655* 573* 731* 706*   Cardiac Enzymes: No results for input(s): CKTOTAL, CKMB, CKMBINDEX, TROPONINI in the last 168 hours. BNP (last 3 results)  Recent Labs  08/05/14 0007  BNP 519.1*    ProBNP (last 3 results) No results for input(s): PROBNP in the last 8760 hours.  CBG: No results for input(s): GLUCAP in the last 168 hours.  Recent Results (from the past 240 hour(s))  Culture, blood (routine x 2)     Status: None (Preliminary result)   Collection Time: 08/27/14  6:34 PM  Result Value Ref Range Status   Specimen Description BLOOD LEFT FATTY CASTS  Final   Special Requests BOTTLES DRAWN AEROBIC AND ANAEROBIC 5ML  Final   Culture   Final           BLOOD CULTURE RECEIVED NO GROWTH TO DATE CULTURE WILL BE HELD FOR 5 DAYS BEFORE ISSUING A FINAL NEGATIVE REPORT Performed at [facility]    Report Status PENDING  Incomplete  Culture, blood (routine x 2)     Status: None (Preliminary result)   Collection Time: 08/27/14  7:05 PM  Result Value Ref Range Status   Specimen Description BLOOD RIGHT ASSIST CONTROL  Final   Special Requests BOTTLES DRAWN AEROBIC AND ANAEROBIC 5ML  Final   Culture   Final           BLOOD CULTURE RECEIVED NO GROWTH TO DATE CULTURE WILL BE HELD FOR 5 DAYS BEFORE ISSUING A FINAL NEGATIVE REPORT Performed at [facility]    Report Status PENDING  Incomplete  Urine culture     Status: None   Collection Time: 08/27/14 11:53 PM  Result Value Ref Range Status    Specimen Description URINE, CLEAN CATCH  Final   Special Requests NONE  Final   Colony Count NO GROWTH Performed at [facility]   Final   Culture NO GROWTH Performed at [facility]   Final   Report Status 08/29/2014 FINAL  Final  Clostridium Difficile by PCR     Status: None   Collection Time: 08/28/14  2:49 PM  Result Value Ref Range Status   C difficile by pcr NEGATIVE NEGATIVE Final  Body fluid culture     Status: None (Preliminary result)   Collection Time: 08/28/14  3:23 PM  Result Value Ref Range Status   Specimen Description PLEURAL  Final   Special Requests NONE  Final   Gram Stain   Final    RARE WBC PRESENT, PREDOMINANTLY PMN NO ORGANISMS SEEN Performed at [facility]    Culture NO GROWTH Performed at [facility]   Final   Report Status PENDING  Incomplete     Studies: Dg Chest 1 View  08/28/2014   CLINICAL DATA:  Status post right side thoracentesis.  EXAM: CHEST  1 VIEW  COMPARISON:  08/28/2014  FINDINGS: Heart size and mediastinal contours appear normal. There is a persistent right pleural effusion. No pneumothorax after thoracentesis. Persistent bilateral lower lobe airspace opacities.  IMPRESSION: 1. No pneumothorax after right thoracentesis. 2. Persistent bilateral lower lobe airspace opacities.   Electronically Signed   By: Kerby Moors M.D.   On: 08/28/2014 15:51   Dg Chest Bilateral Decubitus  08/28/2014   CLINICAL DATA:  Evaluate for pleural effusions, recent pneumonia  EXAM: CHEST - BILATERAL DECUBITUS VIEW  COMPARISON:  Chest radiographs dated 08/27/2014  FINDINGS: Patchy right lower lobe opacity, suspicious for pneumonia.  Layering right pleural effusion.  No pneumothorax.  The heart is normal in size.  IMPRESSION: Patchy right lower lobe opacity, suspicious for pneumonia.  Layering right pleural effusion.   Electronically Signed   By: Julian Hy M.D.   On: 08/28/2014 11:27   US Thoracentesis Asp Pleural Space W/img  Guide  08/28/2014   CLINICAL DATA:  History of breast cancer, pneumonia, recurrent right pleural effusion. Request is made for diagnostic and therapeutic right thoracentesis.  EXAM: ULTRASOUND GUIDED DIAGNOSTIC AND THERAPEUTIC RIGHT THORACENTESIS  COMPARISON:  None.  PROCEDURE: An ultrasound guided thoracentesis was thoroughly discussed with the patient and questions answered. The benefits, risks, alternatives and complications were also discussed. The patient understands and wishes to proceed with the procedure. Written consent was obtained.  Ultrasound was performed to localize and mark an adequate pocket of fluid in the right chest. The area was then prepped and draped in the normal sterile fashion. 1% Lidocaine was used for local anesthesia. Under ultrasound guidance a 19 gauge Yueh catheter was introduced. Thoracentesis was performed. The catheter was removed and a dressing applied.  COMPLICATIONS: None.  FINDINGS: A total of approximately 350 cc's of turbid, yellow fluid was removed. The fluid sample was sent for laboratory analysis.  IMPRESSION: Successful ultrasound guided diagnostic and therapeutic right thoracentesis yielding 350 cc's of pleural fluid.  Read by:  Nash Mantis   Electronically Signed   By: Lucrezia Europe M.D.   On: 08/28/2014 15:55    Scheduled Meds: . budesonide-formoterol  2 puff Inhalation BID  . feeding supplement (RESOURCE BREEZE)  1 Container Oral BID BM  . fluticasone  1-2 spray Each Nare QHS  . heparin subcutaneous  5,000 Units Subcutaneous 3 times per day  . loratadine  10 mg Oral Daily  . piperacillin-tazobactam (ZOSYN)  IV  3.375 g Intravenous Q8H  . raloxifene  60 mg Oral Q breakfast  . saccharomyces boulardii  250 mg Oral BID  . sodium chloride  3 mL Intravenous Q12H  . vancomycin  750 mg Intravenous Q12H   Continuous Infusions:   Active Problems:   Sepsis   Abdominal lymphadenopathy   Empyema   Hypokalemia   HCAP (healthcare-associated pneumonia)    Malnutrition of moderate degree   Leukocytosis    Time spent: 30 min    Berthold Glace  [facility] Pager  [phone]. If 7PM-7AM, please contact night-coverage at [email or web address], password [facility] 08/29/2014, 5:50 PM  LOS: 2 days

## 2014-08-29 NOTE — Care Management Note (Signed)
Case Management Note  Patient Details  Name: Shraddha Lebron MRN: [id number] Date of Birth: 1940-05-30  Subjective/Objective:      74 yo female admitted with hypokalemia             Action/Plan:  Patient stated that she started receiving [facility] RN and PT services after her discharge from the hosptial earlier this month. She stated that [facility] was providing the services. Confirmed HH services with Cyril Mourning, [facility] rep. Await [place] RN and PT orders prior to dc. Expected Discharge Date:   (unknown)               Expected Discharge Plan:  [place]  In-House Referral:     Discharge planning Services  CM Consult  Post Acute Care Choice:    Choice offered to:  Patient  DME Arranged:    DME Agency:     HH Arranged:    [place] Agency:  [facility]  Status of Service:  In process, will continue to follow  Medicare Important Message Given:  No Date Medicare IM Given:    Medicare IM give by:    Date Additional Medicare IM Given:    Additional Medicare Important Message give by:     If discussed at [place] of Stay Meetings, dates discussed:    Additional Comments:  Scot Dock, RN 08/29/2014, 1:24 PM

## 2014-08-30 LAB — CBC
HEMATOCRIT: 29 % — AB (ref 36.0–46.0)
Hemoglobin: 9.3 g/dL — ABNORMAL LOW (ref 12.0–15.0)
MCH: 29.1 pg (ref 26.0–34.0)
MCHC: 32.1 g/dL (ref 30.0–36.0)
MCV: 90.6 fL (ref 78.0–100.0)
Platelets: 871 10*3/uL — ABNORMAL HIGH (ref 150–400)
RBC: 3.2 MIL/uL — ABNORMAL LOW (ref 3.87–5.11)
RDW: 16.5 % — AB (ref 11.5–15.5)
WBC: 15.7 10*3/uL — ABNORMAL HIGH (ref 4.0–10.5)

## 2014-08-30 LAB — BASIC METABOLIC PANEL
ANION GAP: 9 (ref 5–15)
CALCIUM: 7.5 mg/dL — AB (ref 8.9–10.3)
CO2: 23 mmol/L (ref 22–32)
CREATININE: 0.39 mg/dL — AB (ref 0.44–1.00)
Chloride: 107 mmol/L (ref 101–111)
GFR calc Af Amer: >60 mL/min (ref >60)
Glucose, Bld: 94 mg/dL (ref 65–99)
Potassium: 3.3 mmol/L — ABNORMAL LOW (ref 3.5–5.1)
SODIUM: 139 mmol/L (ref 135–145)

## 2014-08-30 LAB — LACTATE DEHYDROGENASE: LDH: 205 U/L — AB (ref 98–192)

## 2014-08-30 LAB — VANCOMYCIN, TROUGH: Vancomycin Tr: 16 ug/mL (ref 10.0–20.0)

## 2014-08-30 MED ORDER — PROMETHAZINE HCL 25 MG/ML IJ SOLN
12.5000 mg | Freq: Four times a day (QID) | INTRAMUSCULAR | Status: DC | PRN
  Administered 2014-08-31: 12.5 mg via INTRAVENOUS
  Filled 2014-08-30: qty 1

## 2014-08-30 NOTE — Progress Notes (Signed)
ANTIBIOTIC CONSULT NOTE  Pharmacy Consult for Vancomycin/Zosyn Indication: pneumonia, sepsis  Allergies  Allergen Reactions  . Fosamax [Alendronate Sodium] Nausea Only  . Sulfonamide Derivatives Rash   Patient Measurements: Height: 4\' 11"  (149.9 cm) Weight: 129 lb 10.1 oz (58.8 kg) IBW/kg (Calculated) : 43.2  Vital Signs: Temp: 98.3 F (36.8 C) (06/04 0505) Temp Source: Oral (06/04 0505) BP: 144/68 mmHg (06/04 0505) Pulse Rate: 107 (06/04 0505) Intake/Output from previous day: 06/03 0701 - 06/04 0700 In: 1450 [P.O.:720; I.V.:250; Blood:30; IV Piggyback:450] Out: -  Intake/Output from this shift:    Labs:  Recent Labs  08/28/14 0535 08/29/14 0520 08/30/14 0541 08/30/14 0655  WBC 14.8* 13.8* 15.7*  --   HGB 7.7* 7.0* 9.3*  --   PLT 731* 706* 871*  --   CREATININE 0.50 0.54  --  0.39*   Estimated Creatinine Clearance: 48.8 mL/min (by C-G formula based on Cr of 0.39).  Recent Labs  08/30/14 0656  [facility] 16    Microbiology: Recent Results (from the past 720 hour(s))  Blood culture (routine x 2)     Status: None   Collection Time: 08/02/14  3:32 PM  Result Value Ref Range Status   Specimen Description BLOOD RIGHT ANTECUBITAL  Final   Special Requests BOTTLES DRAWN AEROBIC AND ANAEROBIC 4 CC EACH  Final   Culture   Final    NO GROWTH 5 DAYS Performed at [facility]    Report Status 08/09/2014 FINAL  Final  Blood culture (routine x 2)     Status: None   Collection Time: 08/02/14  3:45 PM  Result Value Ref Range Status   Specimen Description BLOOD RIGHT ANTECUBITAL  Final   Special Requests BOTTLES DRAWN AEROBIC AND ANAEROBIC 5 CC EACH  Final   Culture   Final    NO GROWTH 5 DAYS Performed at [facility]    Report Status 08/09/2014 FINAL  Final  MRSA PCR Screening     Status: None   Collection Time: 08/02/14  7:00 PM  Result Value Ref Range Status   MRSA by PCR NEGATIVE NEGATIVE Final    Comment:        The GeneXpert MRSA Assay  (FDA approved for NASAL specimens only), is one component of a comprehensive MRSA colonization surveillance program. It is not intended to diagnose MRSA infection nor to guide or monitor treatment for MRSA infections.   Urine culture     Status: None   Collection Time: 08/02/14  8:38 PM  Result Value Ref Range Status   Specimen Description URINE, RANDOM  Final   Special Requests NONE  Final   Colony Count NO GROWTH Performed at [facility]   Final   Culture NO GROWTH Performed at [facility]   Final   Report Status 08/03/2014 FINAL  Final  Clostridium Difficile by PCR     Status: None   Collection Time: 08/03/14 10:13 AM  Result Value Ref Range Status   C difficile by pcr NEGATIVE NEGATIVE Final  Body fluid culture     Status: None   Collection Time: 08/08/14 11:12 AM  Result Value Ref Range Status   Specimen Description PLEURAL  Final   Special Requests NONE  Final   Gram Stain   Final    MODERATE WBC PRESENT, PREDOMINANTLY PMN NO ORGANISMS SEEN Performed at [facility]    Culture   Final    NO GROWTH 3 DAYS Performed at [facility]  Report Status 08/11/2014 FINAL  Final  Culture, blood (routine x 2)     Status: None (Preliminary result)   Collection Time: 08/27/14  6:34 PM  Result Value Ref Range Status   Specimen Description BLOOD LEFT FATTY CASTS  Final   Special Requests BOTTLES DRAWN AEROBIC AND ANAEROBIC 5ML  Final   Culture   Final           BLOOD CULTURE RECEIVED NO GROWTH TO DATE CULTURE WILL BE HELD FOR 5 DAYS BEFORE ISSUING A FINAL NEGATIVE REPORT Performed at [facility]    Report Status PENDING  Incomplete  Culture, blood (routine x 2)     Status: None (Preliminary result)   Collection Time: 08/27/14  7:05 PM  Result Value Ref Range Status   Specimen Description BLOOD RIGHT ASSIST CONTROL  Final   Special Requests BOTTLES DRAWN AEROBIC AND ANAEROBIC 5ML  Final   Culture   Final           BLOOD  CULTURE RECEIVED NO GROWTH TO DATE CULTURE WILL BE HELD FOR 5 DAYS BEFORE ISSUING A FINAL NEGATIVE REPORT Performed at [facility]    Report Status PENDING  Incomplete  Urine culture     Status: None   Collection Time: 08/27/14 11:53 PM  Result Value Ref Range Status   Specimen Description URINE, CLEAN CATCH  Final   Special Requests NONE  Final   Colony Count NO GROWTH Performed at [facility]   Final   Culture NO GROWTH Performed at [facility]   Final   Report Status 08/29/2014 FINAL  Final  Clostridium Difficile by PCR     Status: None   Collection Time: 08/28/14  2:49 PM  Result Value Ref Range Status   C difficile by pcr NEGATIVE NEGATIVE Final  Body fluid culture     Status: None (Preliminary result)   Collection Time: 08/28/14  3:23 PM  Result Value Ref Range Status   Specimen Description PLEURAL  Final   Special Requests NONE  Final   Gram Stain   Final    RARE WBC PRESENT, PREDOMINANTLY PMN NO ORGANISMS SEEN Performed at [facility]    Culture NO GROWTH Performed at [facility]   Final   Report Status PENDING  Incomplete   Medical History: Past Medical History  Diagnosis Date  . Asthma   . Macular degeneration   . Hearing loss     both ears, wwears hearing aides  . Osteopenia   . Arthritis   . Cancer of breast     33 years ago, left,    Medications: Scheduled:  . budesonide-formoterol  2 puff Inhalation BID  . feeding supplement (RESOURCE BREEZE)  1 Container Oral BID BM  . fluticasone  1-2 spray Each Nare QHS  . heparin subcutaneous  5,000 Units Subcutaneous 3 times per day  . loratadine  10 mg Oral Daily  . piperacillin-tazobactam (ZOSYN)  IV  3.375 g Intravenous Q8H  . raloxifene  60 mg Oral Q breakfast  . saccharomyces boulardii  250 mg Oral BID  . sodium chloride  3 mL Intravenous Q12H  . vancomycin  750 mg Intravenous Q12H   Assessment: 51 yoF presents with fatigue s/p recent admission for sepsis  d/t PNA with cavitary lesion and empyema now being readmitted with recurrent PNA, thoracentesis yielding 350 ml.  Vancomycin per Rx and Zosyn per MD for PNA.   Goal of Therapy:  Vancomycin trough level 15-20  mcg/ml  Plan:   Zosyn EI  Vancomycin 1500mg  x1 then 750mg  IV q12h  F/u scr/cultures/levels as needed  Minda Ditto PharmD Pager [phone] 08/30/2014, 7:45 AM

## 2014-08-30 NOTE — Progress Notes (Signed)
[facility] PROGRESS NOTE  [name] YTK:[id number] DOB: [birth year] DOA: 08/27/2014 PCP: Mayra Neer, MD  Brief Summary  The patient is a 74 year old female with history of asthma, arthritis, and breast cancer, hearing loss. She was recently hospitalized with pneumonia and empyema with a cavitary like lesion. She was seen by pulmonology and had a thoracentesis with removal of a liter of purulent fluid. She was treated with Zosyn and discharged home on Augmentin. She had progressive fatigue and re-presented to the emergency department with sepsis. She was started on broad-spectrum antibiotic and pulmonology was consulted for assistance.  She underwent repeat thoracentesis on 6/2 and was found to have a multiloculated exudative pleural effusion. The case was discussed with pulmonology who felt that she did not need to VATS because of the small volume of fluid and that she would recover with anti-biotics alone. Anticipated imminent discharge however today she is having increased nausea and inability to tolerate much by mouth.  Assessment/Plan  Sepsis and acute hypoxic respiratory failure secondary to healthcare associated pneumonia with empyema, resolving - appreciate pulmonology assistance -  continue vancomycin and Zosyn day 4 of 7 -  She underwent thoracentesis on 6/2 by radiology with removal of 350 cc of turbid yellow fluid from the right side  -  No need for VATS -  Strep pneumo and Legionella antigen neg -  Exudative effusion, however, more lymphocytic this time  -  Previous cytology negative for malignancy  Asthma, stable, continue Symbicort, when necessary albuterol, Flonase, Allegr  Normocytic anemia with low iron levels but high ferritin levels consistent with anemia of chronic inflammation or malignancy. Family members are very worried about her anemia. -  Her vitamin B12 and folate levels were within normal limits.  -  TSH was 1.175 and she was occult stool negative. -   Transfused 1 unit PRBC on 6/3 -  Check SPEP/UPEP/IFE  Diffuse abdominal lymph nodes and nonspecific hepatic lesions no atypical cells on smear -  HIV nonreactive -  LDH 205, only mildly elevated and this is probably secondary to her underlying infection. -  Follow with hematology/oncology pending resolution of current infection which may be exacerbating her lymphadenopathy -  She had colonoscopy done in 2015. -  Needs outpatient liver MRI  Leukocytosis and thrombocytosis are likely reactive to her underlying infection, particularly since she has a left shift, rising. -  Trend white blood cell, hemoglobin, white limits  Soft stools, likely side effect of antibiotics and not watery just soft -  C. Diff negative -  Continue florastor  Nausea, vomiting, inability to tolerate by mouth, likely side effects of broad-spectrum antibiotics however this could indicate early C. difficile since she has been on repeated antibiotics. -  If she develops watery diarrhea, please repeat C. difficile PCR -  Encouraged her to use when necessary Zofran  Diet:  Regular cess:  PIV IVF:  Off Proph: heparin  Code Status: Full Family Communication: patient and her husband Disposition Plan: pending improved nausea on oral antibiotics in 1-2 days.   Consultants:  Pulmonology   Procedures   chest x-ray, ultrasound guided thoracentesis of the right side on 6/2  Repeat chest x-ray  Lower extremity venous duplex on 6/2, negative for DVT  Antibiotics:  Vancomycin on 6/1  Zosyn 6/1   HPI/Subjective:  Feeling terrible due to worsening nausea, heaves, early satiety.  Did not sleep well.  Denies abdominal pain and stools are soft but not loose and not worsening.    Objective:  Filed Vitals:   08/29/14 2040 08/29/14 2045 08/30/14 0505 08/30/14 1013  BP:  124/57 144/68   Pulse:  104 107   Temp:  98.4 F (36.9 C) 98.3 F (36.8 C)   TempSrc:  Oral Oral   Resp:  16 22   Height:      Weight:       SpO2: 92% 92% 91% 98%    Intake/Output Summary (Last 24 hours) at 08/30/14 1343 Last data filed at 08/30/14 1316  Gross per 24 hour  Intake   1690 ml  Output      2 ml  Net   1688 ml   Filed Weights   08/27/14 2228  Weight: 58.8 kg (129 lb 10.1 oz)    Exam:   General:  Thin F, No acute distress, ill-appearing and flat affect compared to previous days  HEENT:  NCAT, MMM  Cardiovascular:  RRR, nl S1, S2 no mrg, 2+ pulses, warm extremities  Respiratory:  Course rales and diminished breath sounds at the right base, no rales, rhonchi or wheeze on the left side, mild tachypnea  Abdomen:   NABS, soft, NT/ND  MSK:   Normal tone and bulk, 1+ pitting bilateral lower extremity edema  Neuro:  Grossly intact  Data Reviewed: Basic Metabolic Panel:  Recent Labs Lab 08/27/14 1834 08/28/14 0535 08/29/14 0520 08/30/14 0655  NA 138 141 139 139  K 3.1* 3.8 3.1* 3.3*  CL 107 110 109 107  CO2 24 21* 24 23  GLUCOSE 94 86 92 94  BUN 8 6 <5* <5*  CREATININE 0.43* 0.50 0.54 0.39*  CALCIUM 8.2* 7.8* 7.2* 7.5*   Liver Function Tests:  Recent Labs Lab 08/27/14 1834 08/28/14 0535  AST 65* 44*  ALT 87* 69*  ALKPHOS 106 102  BILITOT 0.2* 0.4  PROT 6.4* 6.1*  ALBUMIN 1.9* 1.9*   No results for input(s): LIPASE, AMYLASE in the last 168 hours. No results for input(s): AMMONIA in the last 168 hours. CBC:  Recent Labs Lab 08/27/14 1834 08/27/14 2015 08/28/14 0535 08/29/14 0520 08/30/14 0541  WBC 13.3* 14.5* 14.8* 13.8* 15.7*  NEUTROABS 8.8* 9.9* 11.6*  --   --   HGB 7.8* 7.6* 7.7* 7.0* 9.3*  HCT 25.0* 24.1* 25.0* 22.7* 29.0*  MCV 91.9 90.6 93.3 92.7 90.6  PLT 655* 573* 731* 706* 871*   Cardiac Enzymes: No results for input(s): CKTOTAL, CKMB, CKMBINDEX, TROPONINI in the last 168 hours. BNP (last 3 results)  Recent Labs  08/05/14 0007  BNP 519.1*    ProBNP (last 3 results) No results for input(s): PROBNP in the last 8760 hours.  CBG: No results for input(s):  GLUCAP in the last 168 hours.  Recent Results (from the past 240 hour(s))  Culture, blood (routine x 2)     Status: None (Preliminary result)   Collection Time: 08/27/14  6:34 PM  Result Value Ref Range Status   Specimen Description BLOOD LEFT FATTY CASTS  Final   Special Requests BOTTLES DRAWN AEROBIC AND ANAEROBIC 5ML  Final   Culture   Final           BLOOD CULTURE RECEIVED NO GROWTH TO DATE CULTURE WILL BE HELD FOR 5 DAYS BEFORE ISSUING A FINAL NEGATIVE REPORT Performed at [facility]    Report Status PENDING  Incomplete  Culture, blood (routine x 2)     Status: None (Preliminary result)   Collection Time: 08/27/14  7:05 PM  Result Value Ref Range Status   Specimen  Description BLOOD RIGHT ASSIST CONTROL  Final   Special Requests BOTTLES DRAWN AEROBIC AND ANAEROBIC 5ML  Final   Culture   Final           BLOOD CULTURE RECEIVED NO GROWTH TO DATE CULTURE WILL BE HELD FOR 5 DAYS BEFORE ISSUING A FINAL NEGATIVE REPORT Performed at [facility]    Report Status PENDING  Incomplete  Urine culture     Status: None   Collection Time: 08/27/14 11:53 PM  Result Value Ref Range Status   Specimen Description URINE, CLEAN CATCH  Final   Special Requests NONE  Final   Colony Count NO GROWTH Performed at [facility]   Final   Culture NO GROWTH Performed at [facility]   Final   Report Status 08/29/2014 FINAL  Final  Clostridium Difficile by PCR     Status: None   Collection Time: 08/28/14  2:49 PM  Result Value Ref Range Status   C difficile by pcr NEGATIVE NEGATIVE Final  Body fluid culture     Status: None (Preliminary result)   Collection Time: 08/28/14  3:23 PM  Result Value Ref Range Status   Specimen Description PLEURAL  Final   Special Requests NONE  Final   Gram Stain   Final    RARE WBC PRESENT, PREDOMINANTLY PMN NO ORGANISMS SEEN Performed at [facility]    Culture   Final    NO GROWTH 1 DAY Performed at [facility]    Report Status PENDING  Incomplete     Studies: Dg Chest 1 View  08/28/2014   CLINICAL DATA:  Status post right side thoracentesis.  EXAM: CHEST  1 VIEW  COMPARISON:  08/28/2014  FINDINGS: Heart size and mediastinal contours appear normal. There is a persistent right pleural effusion. No pneumothorax after thoracentesis. Persistent bilateral lower lobe airspace opacities.  IMPRESSION: 1. No pneumothorax after right thoracentesis. 2. Persistent bilateral lower lobe airspace opacities.   Electronically Signed   By: Kerby Moors M.D.   On: 08/28/2014 15:51   US Thoracentesis Asp Pleural Space W/img Guide  08/28/2014   CLINICAL DATA:  History of breast cancer, pneumonia, recurrent right pleural effusion. Request is made for diagnostic and therapeutic right thoracentesis.  EXAM: ULTRASOUND GUIDED DIAGNOSTIC AND THERAPEUTIC RIGHT THORACENTESIS  COMPARISON:  None.  PROCEDURE: An ultrasound guided thoracentesis was thoroughly discussed with the patient and questions answered. The benefits, risks, alternatives and complications were also discussed. The patient understands and wishes to proceed with the procedure. Written consent was obtained.  Ultrasound was performed to localize and mark an adequate pocket of fluid in the right chest. The area was then prepped and draped in the normal sterile fashion. 1% Lidocaine was used for local anesthesia. Under ultrasound guidance a 19 gauge Yueh catheter was introduced. Thoracentesis was performed. The catheter was removed and a dressing applied.  COMPLICATIONS: None.  FINDINGS: A total of approximately 350 cc's of turbid, yellow fluid was removed. The fluid sample was sent for laboratory analysis.  IMPRESSION: Successful ultrasound guided diagnostic and therapeutic right thoracentesis yielding 350 cc's of pleural fluid.  Read by:  Nash Mantis   Electronically Signed   By: Lucrezia Europe M.D.   On: 08/28/2014 15:55    Scheduled Meds: . budesonide-formoterol   2 puff Inhalation BID  . feeding supplement (RESOURCE BREEZE)  1 Container Oral BID BM  . fluticasone  1-2 spray Each Nare QHS  . heparin subcutaneous  5,000 Units  Subcutaneous 3 times per day  . loratadine  10 mg Oral Daily  . piperacillin-tazobactam (ZOSYN)  IV  3.375 g Intravenous Q8H  . raloxifene  60 mg Oral Q breakfast  . saccharomyces boulardii  250 mg Oral BID  . sodium chloride  3 mL Intravenous Q12H  . vancomycin  750 mg Intravenous Q12H   Continuous Infusions:   Active Problems:   Sepsis   Abdominal lymphadenopathy   Empyema   Hypokalemia   HCAP (healthcare-associated pneumonia)   Malnutrition of moderate degree   Leukocytosis   Pleural effusion on right    Time spent: 30 min    Melyssa Signor, [facility] Pager [phone]. If 7PM-7AM, please contact night-coverage at [email or web address], password [facility] 08/30/2014, 1:43 PM  LOS: 3 days

## 2014-08-31 DIAGNOSIS — D72829 Elevated white blood cell count, unspecified: Secondary | ICD-10-CM

## 2014-08-31 DIAGNOSIS — J189 Pneumonia, unspecified organism: Secondary | ICD-10-CM

## 2014-08-31 DIAGNOSIS — R590 Localized enlarged lymph nodes: Secondary | ICD-10-CM

## 2014-08-31 LAB — BASIC METABOLIC PANEL
Anion gap: 9 (ref 5–15)
BUN: 5 mg/dL — ABNORMAL LOW (ref 6–20)
CALCIUM: 7.6 mg/dL — AB (ref 8.9–10.3)
CO2: 25 mmol/L (ref 22–32)
Chloride: 110 mmol/L (ref 101–111)
Creatinine, Ser: 0.77 mg/dL (ref 0.44–1.00)
GFR calc Af Amer: >60 mL/min (ref >60)
Glucose, Bld: 91 mg/dL (ref 65–99)
Potassium: 3.2 mmol/L — ABNORMAL LOW (ref 3.5–5.1)
SODIUM: 144 mmol/L (ref 135–145)

## 2014-08-31 LAB — TYPE AND SCREEN
ABO/RH(D): A POS
Antibody Screen: NEGATIVE
UNIT DIVISION: 0

## 2014-08-31 LAB — PROCALCITONIN: Procalcitonin: 0.3 ng/mL

## 2014-08-31 LAB — CBC
HCT: 28.6 % — ABNORMAL LOW (ref 36.0–46.0)
Hemoglobin: 8.7 g/dL — ABNORMAL LOW (ref 12.0–15.0)
MCH: 27.4 pg (ref 26.0–34.0)
MCHC: 30.4 g/dL (ref 30.0–36.0)
MCV: 89.9 fL (ref 78.0–100.0)
PLATELETS: 812 10*3/uL — AB (ref 150–400)
RBC: 3.18 MIL/uL — ABNORMAL LOW (ref 3.87–5.11)
RDW: 16.1 % — ABNORMAL HIGH (ref 11.5–15.5)
WBC: 16.1 10*3/uL — ABNORMAL HIGH (ref 4.0–10.5)

## 2014-08-31 MED ORDER — POTASSIUM CHLORIDE CRYS ER 20 MEQ PO TBCR
40.0000 meq | EXTENDED_RELEASE_TABLET | Freq: Two times a day (BID) | ORAL | Status: AC
  Administered 2014-08-31 (×2): 40 meq via ORAL
  Filled 2014-08-31 (×2): qty 2

## 2014-08-31 NOTE — Progress Notes (Signed)
PROGRESS NOTE  [name] [id number]:[id number] DOB: 04/04/1940 DOA: 08/27/2014 PCP: Mayra Neer, MD  Summary: 74 year old woman recently hospitalized for empyema, pneumonia, cavitary like lesion. Underwent thoracentesis of that time with removal of a liter of purulent fluid, treated with Zosyn and then discharged on Augmentin. He developed progressive fatigue and presented back to the emergency department with sepsis. Seen in consultation with pulmonology, underwent repeat thoracentesis 6/2, found to have multiloculated exudative pleural effusion. Not felt to require VATS per pulmonology. Discharge was planned but she developed nausea and inability to tolerate much by mouth.  Assessment/Plan: 1. Sepsis secondary to healthcare associated pneumonia with empyema with associated acute hypoxic respiratory failure. Status post thoracentesis 6/2. Continue antibiotics as per pulmonology. 2. Acute hypoxic respiratory failure. Resolved. 3. Nausea, vomiting. Resolved. Likely secondary to antibiotics. C. difficile PCR was negative. 4. Hypokalemia. Replete. 5. Asthma. Stable. Continue Symbicort. Allegra. Flonase. Albuterol as needed. 6. Anemia of chronic disease.  stable. Vitamin B12 and follow levels within normal limits. TSH within normal limits. Occult blood stool negative. Received one unit packed red blood cells 6/3. 7. Diffuse abdominal lymph nodes, nonspecific hepatic lesions. HIV nonreactive. Follow-up with hematology as an outpatient. History of colonoscopy 2015. Outpatient liver MRI recommended. 8. Leukocytosis, thrombocytosis.  stable. Thought to be reactive secondary to underlying infection. 9. Transaminitis. Less than 2 times normal. Etiology and significance unclear. 10. History of breast cancer, status post mastectomy moderate malnutrition   Continue empiric antibiotics.  Replace potassium.  Plan change to oral abx in AM and likely home.  Code Status: full code DVT prophylaxis:  heparin Family Communication: discussed with husband at bedside Disposition Plan: home  Murray Hodgkins, MD  [facility]  Pager [phone] If 7PM-7AM, please contact night-coverage at [email or web address], password [facility] 08/31/2014, 1:40 PM  LOS: 4 days   Consultants:  Pulmonology  Procedures:  6/2 thoracentesis  6/3 transfusion one unit packed red blood cells  Antibiotics:  Vancomycin on 6/1 >> 6/5  Zosyn 6/1 >> 6/5  HPI/Subjective: Feeling better, nausea persists, no vomiting. Eating ok. Breathing fine.   Objective: Filed Vitals:   08/30/14 2157 08/31/14 0155 08/31/14 0532 08/31/14 1107  BP:  123/62 125/59   Pulse:  106 101   Temp:  98.9 F (37.2 C) 98.8 F (37.1 C)   TempSrc:  Oral Oral   Resp:  20 20   Height:      Weight:      SpO2: 91% 90% 91% 95%    Intake/Output Summary (Last 24 hours) at 08/31/14 1340 Last data filed at 08/31/14 0801  Gross per 24 hour  Intake    480 ml  Output    650 ml  Net   -170 ml     Filed Weights   08/27/14 2228  Weight: 58.8 kg (129 lb 10.1 oz)    Exam:     Afebrile, vital signs stable, no hypoxia General: Appears calm and comfortable Cardiovascular: RRR, no m/r/g. No LE edema. Respiratory: CTA bilaterally anteriorally, no w/r/r. Some posterior coarse breath sounds. Normal respiratory effort. Psychiatric: grossly normal mood and affect, speech fluent and appropriate  New data reviewed:  Potassium 3.2. Remainder of basic metabolic panel unremarkable.  Pro-calcitonin 0.3, trending downwards  WBC 16.1, no significant change  Hemoglobin stable, 8.7   Pertinent data:  HIV nonreactive 07/2014    C. difficile PCR negative  Pending data:  Pleural fluid culture    blood cultures  Scheduled Meds: . budesonide-formoterol  2 puff Inhalation BID  . feeding  supplement (RESOURCE BREEZE)  1 Container Oral BID BM  . fluticasone  1-2 spray Each Nare QHS  . heparin subcutaneous  5,000 Units Subcutaneous 3 times per  day  . loratadine  10 mg Oral Daily  . piperacillin-tazobactam (ZOSYN)  IV  3.375 g Intravenous Q8H  . raloxifene  60 mg Oral Q breakfast  . saccharomyces boulardii  250 mg Oral BID  . sodium chloride  3 mL Intravenous Q12H  . vancomycin  750 mg Intravenous Q12H   Continuous Infusions:   Principal Problem:   HCAP (healthcare-associated pneumonia) Active Problems:   Sepsis   Abdominal lymphadenopathy   Empyema   Hypokalemia   Malnutrition of moderate degree   Leukocytosis   Pleural effusion on right

## 2014-09-01 DIAGNOSIS — J948 Other specified pleural conditions: Secondary | ICD-10-CM

## 2014-09-01 DIAGNOSIS — E440 Moderate protein-calorie malnutrition: Secondary | ICD-10-CM

## 2014-09-01 LAB — UIFE/LIGHT CHAINS/TP QN, 24-HR UR
% BETA, Urine: 0 %
ALBUMIN, U: 100 %
ALPHA 1 URINE: 0 %
Alpha 2, Urine: 0 %
FREE LT CHN EXCR RATE: 504 mg/L — AB (ref 1.35–24.19)
Free Kappa/Lambda Ratio: 9.81 (ref 2.04–10.37)
Free Lambda Lt Chains,Ur: 51.4 mg/L — ABNORMAL HIGH (ref 0.24–6.66)
GAMMA GLOBULIN URINE: 0 %
TOTAL PROTEIN, URINE-UPE24: 30.2 mg/dL

## 2014-09-01 LAB — PROTEIN ELECTROPHORESIS, SERUM
A/G RATIO SPE: 0.5 — AB (ref 0.7–2.0)
A/G Ratio: 0.5 — ABNORMAL LOW (ref 0.7–2.0)
ALBUMIN ELP: 1.6 g/dL — AB (ref 3.2–5.6)
ALPHA-1-GLOBULIN: 0.3 g/dL (ref 0.1–0.4)
Albumin ELP: 1.6 g/dL — ABNORMAL LOW (ref 3.2–5.6)
Alpha-1-Globulin: 0.3 g/dL (ref 0.1–0.4)
Alpha-2-Globulin: 1.2 g/dL (ref 0.4–1.2)
Alpha-2-Globulin: 1.2 g/dL (ref 0.4–1.2)
Beta Globulin: 0.8 g/dL (ref 0.6–1.3)
Beta Globulin: 0.8 g/dL (ref 0.6–1.3)
Gamma Globulin: 1 g/dL (ref 0.5–1.6)
Gamma Globulin: 1.1 g/dL (ref 0.5–1.6)
Globulin, Total: 3.3 g/dL (ref 2.0–4.5)
Globulin, Total: 3.5 g/dL (ref 2.0–4.5)
M-Spike, %: 0.4 g/dL — ABNORMAL HIGH
M-Spike, %: 0.4 g/dL — ABNORMAL HIGH
Total Protein ELP: 4.9 g/dL — ABNORMAL LOW (ref 6.0–8.5)
Total Protein ELP: 5.1 g/dL — ABNORMAL LOW (ref 6.0–8.5)

## 2014-09-01 LAB — BODY FLUID CULTURE: CULTURE: NO GROWTH

## 2014-09-01 MED ORDER — PROMETHAZINE HCL 12.5 MG PO TABS: 12.5000 mg | ORAL_TABLET | Freq: Four times a day (QID) | ORAL | Status: DC | PRN

## 2014-09-01 MED ORDER — SACCHAROMYCES BOULARDII 250 MG PO CAPS: 250.0000 mg | ORAL_CAPSULE | Freq: Two times a day (BID) | ORAL | Status: DC

## 2014-09-01 MED ORDER — LINEZOLID 600 MG PO TABS
600.0000 mg | ORAL_TABLET | Freq: Two times a day (BID) | ORAL | Status: DC
  Administered 2014-09-01: 600 mg via ORAL
  Filled 2014-09-01 (×2): qty 1

## 2014-09-01 MED ORDER — ONDANSETRON 4 MG PO TBDP
4.0000 mg | ORAL_TABLET | Freq: Three times a day (TID) | ORAL | Status: DC | PRN
  Administered 2014-09-01: 4 mg via ORAL
  Filled 2014-09-01: qty 1

## 2014-09-01 MED ORDER — LINEZOLID 600 MG PO TABS: 600.0000 mg | ORAL_TABLET | Freq: Two times a day (BID) | ORAL | Status: DC

## 2014-09-01 NOTE — Discharge Summary (Signed)
Physician Discharge Summary  [name] WFU:[id number] DOB: 08-Oct-1940 DOA: 08/27/2014  PCP: Mayra Neer, MD  Admit date: 08/27/2014 Discharge date: 09/01/2014  Recommendations for Outpatient Follow-up:  1. Resolution of pneumonia, see discussion below. As close outpatient follow-up with pulmonology this week. 1. Followup anemia, thrombocytosis, leukocytosis as an outpatient. 2. Follow-up central mesenteric adenopathy with regional inflammatory/edematous changes seen on CT 5/7. Reactive mesenteric adenitis and lymphoma would be the primary considerations. Significance unclear. This was discussed in detail with the patient. Recommend chest CT in the near future once pneumonia has resolved.  Follow-up Information    Follow up with PARRETT,TAMMY, NP On 09/05/2014.   Specialty:  Nurse Practitioner   Why:  3-30 with CXR   Contact information:   [address] [address] [phone]      Discharge Diagnoses:  1. Sepsis secondary to healthcare associated pneumonia with empyema  2. Acute hypoxic respiratory failure  3. Nausea, vomiting  4. Anemia of chronic disease  5. 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 Leukocytosis, thrombocytosis 7. Transaminitis 8. Moderate malnutrition   Discharge Condition: improved Disposition: home  Diet recommendation: regular  Filed Weights   08/27/14 2228  Weight: 58.8 kg (129 lb 10.1 oz)    History of present illness:  74 year old woman recently hospitalized for empyema, pneumonia, cavitary like lesion. Underwent thoracentesis of that time with removal of a liter of purulent fluid, treated with Zosyn and then discharged on Augmentin. She developed progressive fatigue and presented back to the emergency department with sepsis 6/1.   Hospital Course:  She was treated with broad-spectrum antibiotics. Seen in consultation with pulmonology, underwent repeat thoracentesis 6/2, found to have  multiloculated exudative pleural effusion. Not felt to require VATS per pulmonology. Recommendation was for antibiotics and close outpatient follow-up with pulmonology for repeat chest x-ray. Clinical condition rapidly improved and does not require supplemental oxygen. Case was discussed with infectious disease with recommendations as below. See individual issues below for this complex case.  1. Sepsis secondary to healthcare associated pneumonia with empyema with associated acute hypoxic respiratory failure. Status post thoracentesis 6/2, culture NGTD. Clinically well, no hypoxia.  2. Acute hypoxic respiratory failure. No hypoxia. 3. Nausea, vomiting. Some nausea but no vomiting. Likely secondary to antibiotics. C. difficile PCR was negative. 4. Hypokalemia. Repleted. 5. Asthma. Remains stable. Continue Symbicort. Allegra. Flonase. Albuterol as needed. 6. Anemia of chronic disease. Stable. Vitamin B12 and follow levels within normal limits. TSH within normal limits. Occult blood stool negative. Received one unit packed red blood cells 6/3. Follow-up as outpatient. 7. 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 Nonspecific hepatic lesions. HIV nonreactive. Outpatient liver MRI recommended. 8. Leukocytosis, thrombocytosis. Stable. Thought to be reactive secondary to underlying infection. 9. Transaminitis. Less than 2 times normal and trending down. Etiology and significance unclear. 10. History of breast cancer, status post mastectomy moderate malnutrition   Home on oral abx, Zyvox per my discussion with Dr. Tommy Medal given treatment failure with Augmentin.  FU appt made 6/10 with [facility] pulm for f/u CXR  Followup anemia, thrombocytosis, leukocytosis as an outpatient.  Follow-up central mesenteric adenopathy with regional inflammatory/edematous changes seen on CT 5/7. Reactive mesenteric adenitis and lymphoma would be the primary considerations. This was discussed in  detail with the patient. Recommend repeat CT scan in the outpatient setting.   Consultants:  Pulmonology  Procedures:  6/2 thoracentesis right with 350 fluid removed  6/3 transfusion one unit packed red blood cells  Antibiotics:  Vancomycin  on 6/1 >> 6/5  Zosyn 6/1 >> 6/5  Zyvox 6/6 >> 6/20  Discharge Instructions  Discharge Instructions    Diet general    Complete by:  As directed      Discharge instructions    Complete by:  As directed   Call your physician or seek immediate medical attention for fever, shortness of breath or worsening of condition.     Increase activity slowly    Complete by:  As directed           Current Discharge Medication List    START taking these medications   Details  linezolid (ZYVOX) 600 MG tablet Take 1 tablet (600 mg total) by mouth every 12 (twelve) hours. Qty: 27 tablet, Refills: 0    promethazine (PHENERGAN) 12.5 MG tablet Take 1 tablet (12.5 mg total) by mouth every 6 (six) hours as needed for nausea or vomiting. Qty: 20 tablet, Refills: 0    saccharomyces boulardii (FLORASTOR) 250 MG capsule Take 1 capsule (250 mg total) by mouth 2 (two) times daily. Qty: 60 capsule, Refills: 0      CONTINUE these medications which have NOT CHANGED   Details  albuterol (PROAIR HFA) 108 (90 BASE) MCG/ACT inhaler Inhale 2 puffs into the lungs every 6 (six) hours as needed for wheezing. Qty: 1 Inhaler, Refills: 3    albuterol (PROVENTIL) (2.5 MG/3ML) 0.083% nebulizer solution Take 3 mLs (2.5 mg total) by nebulization every 6 (six) hours as needed. DX 493.90 Qty: 360 mL, Refills: 5    azelastine (ASTELIN) 137 MCG/SPRAY nasal spray Place 1 spray into the nose at bedtime. Use in each nostril as directed    budesonide-formoterol (SYMBICORT) 80-4.5 MCG/ACT inhaler Inhale 2 puffs into the lungs 2 (two) times daily.    calcium carbonate (TUMS) 500 MG chewable tablet Chew 1 tablet by mouth at bedtime.     Cholecalciferol (VITAMIN D) 1000 UNITS  capsule Take 1,000 Units by mouth daily with breakfast.     fexofenadine (ALLEGRA) 180 MG tablet Take 180 mg by mouth daily with breakfast.     fluticasone (FLONASE) 50 MCG/ACT nasal spray Place 1-2 sprays into both nostrils at bedtime.     meloxicam (MOBIC) 15 MG tablet Take 15 mg by mouth daily.    Multiple Vitamins-Minerals (MULTI FOR HER 50+) TABS Take 1 tablet by mouth daily.    Multiple Vitamins-Minerals (PRESERVISION/LUTEIN) CAPS Take 1 capsule by mouth 2 (two) times daily.    Omega-3 Fatty Acids (FISH OIL) 1200 MG CAPS Take 1,200 mg by mouth at bedtime.    raloxifene (EVISTA) 60 MG tablet Take 60 mg by mouth daily with breakfast.       STOP taking these medications     amoxicillin-clavulanate (AUGMENTIN XR) 1000-62.5 MG per tablet      guaiFENesin-dextromethorphan (ROBITUSSIN DM) 100-10 MG/5ML syrup      predniSONE (DELTASONE) 20 MG tablet        Allergies  Allergen Reactions  . Fosamax [Alendronate Sodium] Nausea Only  . Sulfonamide Derivatives Rash    The results of significant diagnostics from this hospitalization (including imaging, microbiology, ancillary and laboratory) are listed below for reference.    Significant Diagnostic Studies: Dg Chest 1 View  08/28/2014   CLINICAL DATA:  Status post right side thoracentesis.  EXAM: CHEST  1 VIEW  COMPARISON:  08/28/2014  FINDINGS: Heart size and mediastinal contours appear normal. There is a persistent right pleural effusion. No pneumothorax after thoracentesis. Persistent bilateral lower lobe airspace opacities.  IMPRESSION:  1. No pneumothorax after right thoracentesis. 2. Persistent bilateral lower lobe airspace opacities.   Electronically Signed   By: Kerby Moors M.D.   On: 08/28/2014 15:51   Dg Chest 2 View  08/27/2014   CLINICAL DATA:  Recent pneumonia with persistent difficulty breathing and cough  EXAM: CHEST  2 VIEW  COMPARISON:  Chest radiograph and chest CT Aug 09, 2014  FINDINGS: There has been interval  clearing of infiltrate from much of the left lung. There remains patchy infiltrate in the left base. On the right, however, there is new airspace consolidation in the right middle lobe. Left upper lobe infiltrate has cleared since prior study. There is a small right effusion. Heart size and pulmonary vascularity are normal. No adenopathy.  IMPRESSION: In comparison with recent prior studies, there is new airspace consolidation in the right middle lobe. There is stable patchy infiltrate in the left base. There is a small right effusion. Elsewhere, lungs are clear; there has been significant upper lobe clearing compared to recent prior studies. No change in cardiac silhouette.   Electronically Signed   By: Lowella Grip III M.D.   On: 08/27/2014 15:14   Dg Chest Bilateral Decubitus  08/28/2014   CLINICAL DATA:  Evaluate for pleural effusions, recent pneumonia  EXAM: CHEST - BILATERAL DECUBITUS VIEW  COMPARISON:  Chest radiographs dated 08/27/2014  FINDINGS: Patchy right lower lobe opacity, suspicious for pneumonia.  Layering right pleural effusion.  No pneumothorax.  The heart is normal in size.  IMPRESSION: Patchy right lower lobe opacity, suspicious for pneumonia.  Layering right pleural effusion.   Electronically Signed   By: Julian Hy M.D.   On: 08/28/2014 11:27     US Thoracentesis Asp Pleural Space W/img Guide  08/28/2014   CLINICAL DATA:  History of breast cancer, pneumonia, recurrent right pleural effusion. Request is made for diagnostic and therapeutic right thoracentesis.  EXAM: ULTRASOUND GUIDED DIAGNOSTIC AND THERAPEUTIC RIGHT THORACENTESIS  COMPARISON:  None.  PROCEDURE: An ultrasound guided thoracentesis was thoroughly discussed with the patient and questions answered. The benefits, risks, alternatives and complications were also discussed. The patient understands and wishes to proceed with the procedure. Written consent was obtained.  Ultrasound was performed to localize and mark an  adequate pocket of fluid in the right chest. The area was then prepped and draped in the normal sterile fashion. 1% Lidocaine was used for local anesthesia. Under ultrasound guidance a 19 gauge Yueh catheter was introduced. Thoracentesis was performed. The catheter was removed and a dressing applied.  COMPLICATIONS: None.  FINDINGS: A total of approximately 350 cc's of turbid, yellow fluid was removed. The fluid sample was sent for laboratory analysis.  IMPRESSION: Successful ultrasound guided diagnostic and therapeutic right thoracentesis yielding 350 cc's of pleural fluid.  Read by:  Nash Mantis   Electronically Signed   By: Lucrezia Europe M.D.   On: 08/28/2014 15:55    Microbiology: Recent Results (from the past 240 hour(s))  Culture, blood (routine x 2)     Status: None (Preliminary result)   Collection Time: 08/27/14  6:34 PM  Result Value Ref Range Status   Specimen Description BLOOD LEFT FATTY CASTS  Final   Special Requests BOTTLES DRAWN AEROBIC AND ANAEROBIC 5ML  Final   Culture   Final           BLOOD CULTURE RECEIVED NO GROWTH TO DATE CULTURE WILL BE HELD FOR 5 DAYS BEFORE ISSUING A FINAL NEGATIVE REPORT Performed at [facility]  Report Status PENDING  Incomplete  Culture, blood (routine x 2)     Status: None (Preliminary result)   Collection Time: 08/27/14  7:05 PM  Result Value Ref Range Status   Specimen Description BLOOD RIGHT ASSIST CONTROL  Final   Special Requests BOTTLES DRAWN AEROBIC AND ANAEROBIC 5ML  Final   Culture   Final           BLOOD CULTURE RECEIVED NO GROWTH TO DATE CULTURE WILL BE HELD FOR 5 DAYS BEFORE ISSUING A FINAL NEGATIVE REPORT Performed at [facility]    Report Status PENDING  Incomplete  Urine culture     Status: None   Collection Time: 08/27/14 11:53 PM  Result Value Ref Range Status   Specimen Description URINE, CLEAN CATCH  Final   Special Requests NONE  Final   Colony Count NO GROWTH Performed at [facility]    Final   Culture NO GROWTH Performed at [facility]   Final   Report Status 08/29/2014 FINAL  Final  Clostridium Difficile by PCR     Status: None   Collection Time: 08/28/14  2:49 PM  Result Value Ref Range Status   C difficile by pcr NEGATIVE NEGATIVE Final  Body fluid culture     Status: None   Collection Time: 08/28/14  3:23 PM  Result Value Ref Range Status   Specimen Description PLEURAL  Final   Special Requests NONE  Final   Gram Stain   Final    RARE WBC PRESENT, PREDOMINANTLY PMN NO ORGANISMS SEEN Performed at [facility]    Culture   Final    NO GROWTH 3 DAYS Performed at [facility]    Report Status 09/01/2014 FINAL  Final     Labs: Basic Metabolic Panel:  Recent Labs Lab 08/27/14 1834 08/28/14 0535 08/29/14 0520 08/30/14 0655 08/31/14 0528  NA 138 141 139 139 144  K 3.1* 3.8 3.1* 3.3* 3.2*  CL 107 110 109 107 110  CO2 24 21* 24 23 25   GLUCOSE 94 86 92 94 91  BUN 8 6 <5* <5* 5*  CREATININE 0.43* 0.50 0.54 0.39* 0.77  CALCIUM 8.2* 7.8* 7.2* 7.5* 7.6*   Liver Function Tests:  Recent Labs Lab 08/27/14 1834 08/28/14 0535  AST 65* 44*  ALT 87* 69*  ALKPHOS 106 102  BILITOT 0.2* 0.4  PROT 6.4* 6.1*  ALBUMIN 1.9* 1.9*    CBC:  Recent Labs Lab 08/27/14 1834 08/27/14 2015 08/28/14 0535 08/29/14 0520 08/30/14 0541 08/31/14 0528  WBC 13.3* 14.5* 14.8* 13.8* 15.7* 16.1*  NEUTROABS 8.8* 9.9* 11.6*  --   --   --   HGB 7.8* 7.6* 7.7* 7.0* 9.3* 8.7*  HCT 25.0* 24.1* 25.0* 22.7* 29.0* 28.6*  MCV 91.9 90.6 93.3 92.7 90.6 89.9  PLT 655* 573* 731* 706* 871* 812*      Principal Problem:   HCAP (healthcare-associated pneumonia) Active Problems:   Sepsis   Abdominal lymphadenopathy   Empyema   Hypokalemia   Malnutrition of moderate degree   Leukocytosis   Pleural effusion on right   Time coordinating discharge: 40 minutes  Signed:  Murray Hodgkins, MD [facility] 09/01/2014, 11:45 AM

## 2014-09-01 NOTE — Progress Notes (Signed)
09/01/14 1200  Reviewed discharge instructions with patient. Patient verbalized understanding of discharge instructions. Copy of discharge instructions given to patient.

## 2014-09-01 NOTE — Progress Notes (Signed)
PROGRESS NOTE  [name] XHB:[id number] DOB: [birth year] DOA: 08/27/2014 PCP: Kendra Neer, MD  Summary: 74 year old woman recently hospitalized for empyema, pneumonia, cavitary like lesion. Underwent thoracentesis of that time with removal of a liter of purulent fluid, treated with Zosyn and then discharged on Augmentin. He developed progressive fatigue and presented back to the emergency department with sepsis. Seen in consultation with pulmonology, underwent repeat thoracentesis 6/2, found to have multiloculated exudative pleural effusion. Not felt to require VATS per pulmonology. Discharge was planned but she developed nausea and inability to tolerate much by mouth.  Assessment/Plan: 1. Sepsis secondary to healthcare associated pneumonia with empyema with associated acute hypoxic respiratory failure. Status post thoracentesis 6/2, culture NGTD. Clinically well, no hypoxia.  2. Acute hypoxic respiratory failure. No hypoxia. 3. Nausea, vomiting. Some nausea but no vomiting. Likely secondary to antibiotics. C. difficile PCR was negative. 4. Hypokalemia. Repleted. 5. Asthma. Remains stable. Continue Symbicort. Allegra. Flonase. Albuterol as needed. 6. Anemia of chronic disease. Stable. Vitamin B12 and follow levels within normal limits. TSH within normal limits. Occult blood stool negative. Received one unit packed red blood cells 6/3. Follow-up as outpatient. 7. 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 Nonspecific hepatic lesions. HIV nonreactive. Outpatient liver MRI recommended. 8. Leukocytosis, thrombocytosis. Stable. Thought to be reactive secondary to underlying infection. 9. Transaminitis. Less than 2 times normal and trending down. Etiology and significance unclear. 10. History of breast cancer, status post mastectomy moderate malnutrition   Home on oral abx, Zyvox per my discussed with [name] given treatment failure with Augmentin.  FU appt  made 6/10 with [facility] pulm for f/u CXR  Followup anemia, thrombocytosis, leukocytosis as an outpatient.  Follow-up central mesenteric adenopathy with regional inflammatory/edematous changes seen on CT 5/7. Reactive mesenteric adenitis and lymphoma would be the primary considerations.  Code Status: full code DVT prophylaxis: heparin Family Communication:   Disposition Plan: home  Kendra Hodgkins, MD  [name]  Pager [phone] If 7PM-7AM, please contact night-coverage at [email or web address], password [facility] 09/01/2014, 8:50 AM  LOS: 5 days   Consultants:  Pulmonology  Procedures:  6/2 thoracentesis right with 350 fluid removed  6/3 transfusion one unit packed red blood cells  Antibiotics:  Vancomycin on 6/1 >> 6/5  Zosyn 6/1 >> 6/5  Zyvox 6/6 >> 6/20  HPI/Subjective: Wants to go home, breathing well.  Objective: Filed Vitals:   09/01/14 0241 09/01/14 0258 09/01/14 0831 09/01/14 0836  BP: 125/57 127/66 117/52   Pulse: 104 105 102   Temp: 98.4 F (36.9 C) 98.2 F (36.8 C) 97.8 F (36.6 C)   TempSrc: Oral Oral Oral   Resp: 18 18 16    Height:      Weight:      SpO2: 90% 91% 92% 94%    Intake/Output Summary (Last 24 hours) at 09/01/14 0850 Last data filed at 09/01/14 0831  Gross per 24 hour  Intake    960 ml  Output    500 ml  Net    460 ml     Filed Weights   08/27/14 2228  Weight: 58.8 kg (129 lb 10.1 oz)    Exam:    Afebrile >72 hours, VSS, no hypoxia General:  Appears comfortable, calm. Cardiovascular: Regular rate and rhythm, no murmur, rub or gallop. No lower extremity edema. Respiratory: Clear to auscultation bilaterally, no wheezes, rales or rhonchi. Normal respiratory effort. Psychiatric: grossly normal mood and affect, speech fluent and appropriate Neurologic: grossly non-focal.  New data reviewed:  Pleural fluid  culture NG, final  Pertinent data:  HIV nonreactive 07/2014    C. difficile PCR negative  Pending data:   blood cultures  NGTD  Scheduled Meds: . budesonide-formoterol  2 puff Inhalation BID  . feeding supplement (RESOURCE BREEZE)  1 Container Oral BID BM  . fluticasone  1-2 spray Each Nare QHS  . heparin subcutaneous  5,000 Units Subcutaneous 3 times per day  . loratadine  10 mg Oral Daily  . piperacillin-tazobactam (ZOSYN)  IV  3.375 g Intravenous Q8H  . raloxifene  60 mg Oral Q breakfast  . saccharomyces boulardii  250 mg Oral BID  . sodium chloride  3 mL Intravenous Q12H  . vancomycin  750 mg Intravenous Q12H   Continuous Infusions:   Principal Problem:   HCAP (healthcare-associated pneumonia) Active Problems:   Sepsis   Abdominal lymphadenopathy   Empyema   Hypokalemia   Malnutrition of moderate degree   Leukocytosis   Pleural effusion on right

## 2014-09-01 NOTE — Progress Notes (Signed)
08/31/14 0840 Patient IV infiltrated. MD aware Vancomycin was not received.

## 2014-09-01 NOTE — Care Management Note (Signed)
Case Management Note  Patient Details  Name: [name] MRN: [id number] Date of Birth: [birth year]  Subjective/Objective:                  74 yo female admitted with HCAP  Action/Plan: Communicated referral for [facility] RN and PT to resume services upon dc to [place] rep.  Expected Discharge Date:   (unknown)               Expected Discharge Plan:  [place]  In-House Referral:     Discharge planning Services  CM Consult  Post Acute Care Choice:    Choice offered to:  Patient, Spouse  DME Arranged:    DME Agency:     HH Arranged:  RN, PT [place] Agency:  [place]  Status of Service:  In process, will continue to follow  Medicare Important Message Given:  Yes Date Medicare IM Given:  09/01/14 Medicare IM give by:  Leanne Chang Date Additional Medicare IM Given:    Additional Medicare Important Message give by:     If discussed at [place] of Stay Meetings, dates discussed:    Additional Comments:  Scot Dock, RN 09/01/2014, 12:07 PM

## 2014-09-03 DIAGNOSIS — R590 Localized enlarged lymph nodes: Secondary | ICD-10-CM | POA: Diagnosis not present

## 2014-09-03 DIAGNOSIS — R197 Diarrhea, unspecified: Secondary | ICD-10-CM | POA: Diagnosis not present

## 2014-09-03 DIAGNOSIS — A419 Sepsis, unspecified organism: Secondary | ICD-10-CM | POA: Diagnosis not present

## 2014-09-03 DIAGNOSIS — J189 Pneumonia, unspecified organism: Secondary | ICD-10-CM | POA: Diagnosis not present

## 2014-09-03 DIAGNOSIS — J439 Emphysema, unspecified: Secondary | ICD-10-CM | POA: Diagnosis not present

## 2014-09-03 LAB — CULTURE, BLOOD (ROUTINE X 2)
Culture: NO GROWTH
Culture: NO GROWTH

## 2014-09-04 ENCOUNTER — Telehealth: Payer: Self-pay | Admitting: Adult Health

## 2014-09-04 LAB — UIFE/LIGHT CHAINS/TP QN, 24-HR UR
% BETA, Urine: 11.8 %
ALPHA 1 URINE: 1.3 %
ALPHA 2 UR: 10.6 %
Albumin, U: 35 %
FREE LT CHN EXCR RATE: 242 mg/L — AB (ref 1.35–24.19)
Free Kappa/Lambda Ratio: 7.25 (ref 2.04–10.37)
Free Lambda Lt Chains,Ur: 33.4 mg/L — ABNORMAL HIGH (ref 0.24–6.66)
GAMMA GLOBULIN URINE: 41.3 %
Total Protein, Urine-Ur/day: 24.9 mg/24 hr — ABNORMAL LOW (ref 30.0–150.0)
Total Protein, Urine: 24.9 mg/dL
Volume, Urine: 10 mL

## 2014-09-04 MED ORDER — PREDNISONE 10 MG PO TABS: ORAL_TABLET | ORAL | Status: DC

## 2014-09-04 NOTE — Telephone Encounter (Signed)
She should continue symbicort, and can use albuterol every 4 to 6 hours.  If she feels like she needs something more before she is seen tomorrow, then please send script for prednisone 10 mg pills >> 3 pills for 2 days, 2 pills for 2 days, 1 pill for 2 days.

## 2014-09-04 NOTE — Telephone Encounter (Signed)
Spoke with pt. She reports she is having increase SOB, wheezing, chest tx x last night. She reports she was not able to sleep. She has pending HFU tomorrow with TP at 3:30 w/ CXR prior.  Pt is requesting further recs until she can be seen tomorrow. Please advise VS thanks  Last OV 11/2012 w/ VS

## 2014-09-04 NOTE — Telephone Encounter (Signed)
Called pt and is aware of recs. rx for prednisone sent in. Nothing further needed

## 2014-09-05 ENCOUNTER — Encounter: Payer: Self-pay | Admitting: Adult Health

## 2014-09-05 ENCOUNTER — Other Ambulatory Visit (INDEPENDENT_AMBULATORY_CARE_PROVIDER_SITE_OTHER): Payer: Commercial Managed Care - HMO

## 2014-09-05 ENCOUNTER — Telehealth: Payer: Self-pay | Admitting: Pulmonary Disease

## 2014-09-05 ENCOUNTER — Encounter (INDEPENDENT_AMBULATORY_CARE_PROVIDER_SITE_OTHER): Payer: Self-pay

## 2014-09-05 ENCOUNTER — Ambulatory Visit (INDEPENDENT_AMBULATORY_CARE_PROVIDER_SITE_OTHER)
Admission: RE | Admit: 2014-09-05 | Discharge: 2014-09-05 | Disposition: A | Discharge status: Home / Self Care | Payer: Commercial Managed Care - HMO | Source: Ambulatory Visit | Attending: Adult Health | Admitting: Adult Health

## 2014-09-05 ENCOUNTER — Ambulatory Visit (INDEPENDENT_AMBULATORY_CARE_PROVIDER_SITE_OTHER): Payer: Commercial Managed Care - HMO | Admitting: Adult Health

## 2014-09-05 VITALS — BP 130/88 | HR 105 | Temp 98.0°F | Ht 59.0 in | Wt 131.0 lb

## 2014-09-05 DIAGNOSIS — J4541 Moderate persistent asthma with (acute) exacerbation: Secondary | ICD-10-CM

## 2014-09-05 DIAGNOSIS — E440 Moderate protein-calorie malnutrition: Secondary | ICD-10-CM

## 2014-09-05 DIAGNOSIS — J189 Pneumonia, unspecified organism: Secondary | ICD-10-CM | POA: Diagnosis not present

## 2014-09-05 DIAGNOSIS — J869 Pyothorax without fistula: Secondary | ICD-10-CM | POA: Diagnosis not present

## 2014-09-05 DIAGNOSIS — R0600 Dyspnea, unspecified: Secondary | ICD-10-CM | POA: Diagnosis not present

## 2014-09-05 DIAGNOSIS — J90 Pleural effusion, not elsewhere classified: Secondary | ICD-10-CM | POA: Diagnosis not present

## 2014-09-05 LAB — CBC WITH DIFFERENTIAL/PLATELET
Basophils Absolute: 0.1 10*3/uL (ref 0.0–0.1)
Basophils Relative: 0.7 % (ref 0.0–3.0)
EOS ABS: 0.1 10*3/uL (ref 0.0–0.7)
Eosinophils Relative: 0.4 % (ref 0.0–5.0)
HCT: 28.9 % — ABNORMAL LOW (ref 36.0–46.0)
Hemoglobin: 9.3 g/dL — ABNORMAL LOW (ref 12.0–15.0)
LYMPHS ABS: 2.9 10*3/uL (ref 0.7–4.0)
LYMPHS PCT: 16.2 % (ref 12.0–46.0)
MCHC: 32.2 g/dL (ref 30.0–36.0)
MCV: 87 fl (ref 78.0–100.0)
MONOS PCT: 9.1 % (ref 3.0–12.0)
Monocytes Absolute: 1.6 10*3/uL — ABNORMAL HIGH (ref 0.1–1.0)
Neutro Abs: 13.3 10*3/uL — ABNORMAL HIGH (ref 1.4–7.7)
Neutrophils Relative %: 73.6 % (ref 43.0–77.0)
Platelets: 759 10*3/uL — ABNORMAL HIGH (ref 150.0–400.0)
RBC: 3.32 Mil/uL — ABNORMAL LOW (ref 3.87–5.11)
RDW: 16.5 % — ABNORMAL HIGH (ref 11.5–15.5)
WBC: 18 10*3/uL (ref 4.0–10.5)

## 2014-09-05 LAB — COMPREHENSIVE METABOLIC PANEL
ALK PHOS: 109 U/L (ref 39–117)
ALT: 19 U/L (ref 0–35)
AST: 20 U/L (ref 0–37)
Albumin: 2.6 g/dL — ABNORMAL LOW (ref 3.5–5.2)
BUN: 6 mg/dL (ref 6–23)
CHLORIDE: 104 meq/L (ref 96–112)
CO2: 26 mEq/L (ref 19–32)
Calcium: 8.7 mg/dL (ref 8.4–10.5)
Creatinine, Ser: 0.66 mg/dL (ref 0.40–1.20)
GFR: 93.18 mL/min (ref >60.00)
Glucose, Bld: 91 mg/dL (ref 70–99)
POTASSIUM: 3.8 meq/L (ref 3.5–5.1)
Sodium: 137 mEq/L (ref 135–145)
Total Bilirubin: 0.3 mg/dL (ref 0.2–1.2)
Total Protein: 6.7 g/dL (ref 6.0–8.3)

## 2014-09-05 LAB — BRAIN NATRIURETIC PEPTIDE: Pro B Natriuretic peptide (BNP): 133 pg/mL — ABNORMAL HIGH (ref 0.0–100.0)

## 2014-09-05 MED ORDER — ALBUTEROL SULFATE HFA 108 (90 BASE) MCG/ACT IN AERS: 2.0000 | INHALATION_SPRAY | Freq: Four times a day (QID) | RESPIRATORY_TRACT | Status: DC | PRN

## 2014-09-05 NOTE — Patient Instructions (Signed)
Begin Prednisone taper as directed.  Try to eat and drink more.  Add Ensure between meals.  Labs today  Follow up with primary MD as planned .  Follow up Dr. Halford Chessman  Next week with chest xray .  Please contact office for sooner follow up if symptoms do not improve or worsen or seek emergency care

## 2014-09-05 NOTE — Progress Notes (Signed)
Subjective:    Patient ID: [name], female    DOB: 05/13/1940, 74 y.o.   MRN: [id number]  HPI 25 never smoker with asthma.    09/05/2014 post hospital follow-up Patient was admitted June 1 through June 6 for Sepsis secondary to healthcare associated pneumonia with empyema and acute hypoxic respiratory failure Patient was recently admitted prior to this admission for pneumonia with empyema. She was treated with Zosyn and discharged on Augmentin. After discharge. Patient has significant weakness and fatigue and was readmitted on June 1 with sepsis. She had a repeat thoracentesis on June 2 and found to have exudative pleural effusion. She was treated with IV anabiotic's. Pleural fluid culture was negative. CT chest on May 14 showed no mediastinal or hilar lymphadenopathy. Improvement in the right lower lobe consolidation, thin walled cavitary lesion in the anterior right lower lobe. Scattered groundglass opacities. She did have diarrhea. CT abdomen showed central mesenteric adenopathy and has been recommended for outpatient follow-up with an MRI. Patient was discharged on Zyvox .    Says she has been having more dyspnea/DOE with leg swelling . Feels tight like her asthma is acting up . Took albuterol inhaler which helped. Given xopenex neb in office with improvement . Was called in prednisone yesterday but did not start.  Had ven doppler last week neg for dvt.  No cough , discolored mucus or fever.  Has nausea with low appetite . We discussed increasing intake.  Diarrhea is better.   Labs reviewed in hospital showed abnormal LFT and protein electrophoresis and urine IFE .  She has appointment next week with PFT      Tests: CXR 10/18/11>>Chronic scarring and volume loss in the right middle lobe and lingula similar to prior examinations Spirometry 12/07/11>>1.31 (71%), FEV1% 66 Exhaled nitric oxide 12/07/11>>8 ppb (normal) Lt heart cath 01/24/12 >> Normal coronaries, LVEDP 8 mmHg, EF  55% PFT 12/05/12 >> FEV1 1.49 (82%), FEV1% 69, FEF 25-75% 1.09 (69%), TLC 4.38 (85%), DLCO 87%, borderline BD response    Review of Systems  Constitutional:   No  weight loss, night sweats,  Fevers, chills,  +fatigue, or  lassitude.  HEENT:   No headaches,  Difficulty swallowing,  Tooth/dental problems, or  Sore throat,                No sneezing, itching, ear ache, nasal congestion, post nasal drip,   CV:  No chest pain,  Orthopnea, PND,  , anasarca, dizziness, palpitations, syncope.   GI  No heartburn, indigestion, abdominal pain,  Resp: .  No chest wall deformity  Skin: no rash or lesions.  GU: no dysuria, change in color of urine, no urgency or frequency.  No flank pain, no hematuria   MS:  No joint pain or swelling.  No decreased range of motion.  No back pain.  Psych:  No change in mood or affect. No depression or anxiety.  No memory loss.          Objective:   Physical Exam GEN: A/Ox3; pleasant , NAD, frail and elderly   HEENT:  [place]/AT,  EACs-clear, TMs-wnl, NOSE-clear, THROAT-clear, no lesions, no postnasal drip or exudate noted.   NECK:  Supple w/ fair ROM; no JVD; normal carotid impulses w/o bruits; no thyromegaly or nodules palpated; no lymphadenopathy.  RESP  Decreased BS  no accessory muscle use, no dullness to percussion  CARD:  RRR, no m/r/g  ,1+ peripheral edema, pulses intact, no cyanosis or clubbing.  GI:  Soft & nt; nml bowel sounds; no organomegaly or masses detected.  Musco: Warm bil, no deformities or joint swelling noted.   Neuro: alert, no focal deficits noted.    Skin: Warm, no lesions or rashes         Assessment & Plan:

## 2014-09-05 NOTE — Assessment & Plan Note (Signed)
Mild flare , improved with xopenex  Do recommend she take  Prednisone  She does have leg swelling but does not appear to be in acute fluid overload in fact she  Looks more dry than anything-worry lasix will worsen her weakness.  Check bnp   Plan  Begin Prednisone taper as directed.  Try to eat and drink more.  Add Ensure between meals.  Labs today  Follow up with primary MD as planned .  Follow up Dr. Halford Chessman  Next week with chest xray .  Please contact office for sooner follow up if symptoms do not improve or worsen or seek emergency care

## 2014-09-05 NOTE — Assessment & Plan Note (Signed)
Right sided Empyema /CAP with pleural effusion  cxr with little change , will need to continue to follow closely  May need CT chest in future if cxr is not clearing.  Close follow up with Dr. Halford Chessman  In 1 week with cxr.

## 2014-09-05 NOTE — Telephone Encounter (Signed)
Pt seen today 6.10.16 by TP for HFU for sepsis/PNA/pleural effusion Per TP: Patient Instructions       Begin Prednisone taper as directed.   Try to eat and drink more.   Add Ensure between meals.   Labs today   Follow up with primary MD as planned .   Follow up Dr. Halford Chessman  Next week with chest xray .   Please contact office for sooner follow up if symptoms do not improve or worsen or seek emergency care    Dr Halford Chessman, pt has not seen you since 2014 and you are in the office 3 days next week.  May pt be worked in?  Ashtyn not in the office today to advise. Will forward to VS and Ashtyn to help ensure follow up

## 2014-09-05 NOTE — Assessment & Plan Note (Signed)
Encouraged on diet  Add ensure

## 2014-09-08 DIAGNOSIS — A419 Sepsis, unspecified organism: Secondary | ICD-10-CM | POA: Diagnosis not present

## 2014-09-08 DIAGNOSIS — R590 Localized enlarged lymph nodes: Secondary | ICD-10-CM | POA: Diagnosis not present

## 2014-09-08 DIAGNOSIS — J189 Pneumonia, unspecified organism: Secondary | ICD-10-CM | POA: Diagnosis not present

## 2014-09-08 DIAGNOSIS — R197 Diarrhea, unspecified: Secondary | ICD-10-CM | POA: Diagnosis not present

## 2014-09-08 DIAGNOSIS — J439 Emphysema, unspecified: Secondary | ICD-10-CM | POA: Diagnosis not present

## 2014-09-08 NOTE — Progress Notes (Signed)
Reviewed and agree with assessment/plan. 

## 2014-09-08 NOTE — Telephone Encounter (Signed)
Okay to double book visit with me this week.  Please make sure she is scheduled for chest xray before seeing me.

## 2014-09-08 NOTE — Telephone Encounter (Signed)
Pt scheduled for OV with VS 09/11/14 at 11am. Noted that CXR needed prior to visit on appt.  Nothing further needed.

## 2014-09-11 ENCOUNTER — Encounter: Payer: Self-pay | Admitting: Pulmonary Disease

## 2014-09-11 ENCOUNTER — Ambulatory Visit (INDEPENDENT_AMBULATORY_CARE_PROVIDER_SITE_OTHER)
Admission: RE | Admit: 2014-09-11 | Discharge: 2014-09-11 | Disposition: A | Discharge status: Home / Self Care | Payer: Commercial Managed Care - HMO | Source: Ambulatory Visit | Attending: Pulmonary Disease | Admitting: Pulmonary Disease

## 2014-09-11 ENCOUNTER — Ambulatory Visit (INDEPENDENT_AMBULATORY_CARE_PROVIDER_SITE_OTHER): Payer: Commercial Managed Care - HMO | Admitting: Pulmonary Disease

## 2014-09-11 VITALS — BP 142/80 | HR 101 | Ht 59.5 in | Wt 123.6 lb

## 2014-09-11 DIAGNOSIS — J948 Other specified pleural conditions: Secondary | ICD-10-CM | POA: Diagnosis not present

## 2014-09-11 DIAGNOSIS — J45909 Unspecified asthma, uncomplicated: Secondary | ICD-10-CM | POA: Diagnosis not present

## 2014-09-11 DIAGNOSIS — J90 Pleural effusion, not elsewhere classified: Secondary | ICD-10-CM

## 2014-09-11 DIAGNOSIS — J189 Pneumonia, unspecified organism: Secondary | ICD-10-CM | POA: Diagnosis not present

## 2014-09-11 DIAGNOSIS — J869 Pyothorax without fistula: Secondary | ICD-10-CM | POA: Diagnosis not present

## 2014-09-11 DIAGNOSIS — A419 Sepsis, unspecified organism: Secondary | ICD-10-CM

## 2014-09-11 DIAGNOSIS — J984 Other disorders of lung: Secondary | ICD-10-CM

## 2014-09-11 NOTE — Progress Notes (Signed)
Chief Complaint  Patient presents with  . Follow-up    Follow up from [place] today. No change in breathing since last OV.    History of Present Illness: [name] is a 74 y.o. female never smoker with asthma.  She was in hospital in May and June for pneumonia and pleural effusion.  Her breathing is slowly improving.  She is not having as much pain in her chest.  Her cough and sputum have decreased.  She denies fever.  She still feels very weak.  She remains on antibiotics.  Her chest xray today shows improvement in Rt pleural effusion, but this still persists.  She also still has RML and RLL consolidation.   Tests: CXR 10/18/11>>Chronic scarring and volume loss in the right middle lobe and lingula similar to prior examinations Spirometry 12/07/11>>1.31 (71%), FEV1% 66 Exhaled nitric oxide 12/07/11>>8 ppb (normal) Lt heart cath 01/24/12 >> Normal coronaries, LVEDP 8 mmHg, EF 55% PFT 12/05/12 >> FEV1 1.49 (82%), FEV1% 69, FEF 25-75% 1.09 (69%), TLC 4.38 (85%), DLCO 87%, borderline BD response CT chest 08/02/14 >> RML consolidation, small Rt pleural effusion Echo 08/03/14 >> EF 50 to 55%, mild MR CT chest 08/09/14 >> Rt lower lobe thin walled cavity, scattered GGO and interlobular septal thickening Rt pleural fluid 08/08/14 >> glucose 59, protein <3, LDH 6800, WBC 16,640 (86%N), cytology negative for malignancy Rt pleural fluid 08/28/14 >> glucose 59, LDH 178, protein 3.5, WBC 303 (68%L)   PMHx >> Macular degeneration, Breast cancer  PSHx, Medications, Allergies, Fhx, Shx reviewed.  Physical Exam: Blood pressure 142/80, pulse 101, height 4' 11.5" (1.511 m), weight 56.065 kg (123 lb 9.6 oz), SpO2 96 %. Body mass index is 24.56 kg/(m^2).  General - No distress ENT - No sinus tenderness, no nasal discharge, scalloped tongue, no oral exudate, no LAN Cardiac - s1s2 regular, no murmur Chest - no wheeze Back - no focal tenderness Abd - soft, non-tender Ext - no edema Neuro - normal  strength Skin - no rashes Psych - normal mood, and behavior  Assessment/Plan:  Recurrent PNA with cavity development in RLL. Empyema. Discussion: She has improved clinically, but still has persistent changes on CXR. Plan: - she is to continue linezolid for now - will repeat CT chest w/o contrast, and then determine how long she should remain on Abx and whether she would need ID evaluatiion  Asthma. Plan: - Continue symbicort and prn albuterol  Allergic rhinitis. Plan: - continue allegra, flonase, astelin per PCP  Deconditioning. Discussion: Explained this is not uncommon after prolonged critical illness. Plan: - she might benefit from physical therapy regimen >> defer to her PCP  Elevated blood pressure reading today. Plan: - f/u with her PCP   [name], MD [facility]/Sleep Pager:  [phone] 09/11/2014, 11:25 AM

## 2014-09-11 NOTE — Patient Instructions (Signed)
Will schedule CT chest Follow up in 4 weeks with Dr. Halford Chessman or [facility]

## 2014-09-12 DIAGNOSIS — A419 Sepsis, unspecified organism: Secondary | ICD-10-CM | POA: Diagnosis not present

## 2014-09-12 DIAGNOSIS — R590 Localized enlarged lymph nodes: Secondary | ICD-10-CM | POA: Diagnosis not present

## 2014-09-12 DIAGNOSIS — J439 Emphysema, unspecified: Secondary | ICD-10-CM | POA: Diagnosis not present

## 2014-09-12 DIAGNOSIS — J189 Pneumonia, unspecified organism: Secondary | ICD-10-CM | POA: Diagnosis not present

## 2014-09-12 DIAGNOSIS — R197 Diarrhea, unspecified: Secondary | ICD-10-CM | POA: Diagnosis not present

## 2014-09-12 NOTE — Progress Notes (Signed)
Quick Note:  Called and spoke with pt Reviewed results and recs. Notified her that she will need to follow up with PCP for labs. Pt voiced understanding and had no further questions. ______

## 2014-09-13 DIAGNOSIS — J439 Emphysema, unspecified: Secondary | ICD-10-CM | POA: Diagnosis not present

## 2014-09-13 DIAGNOSIS — J189 Pneumonia, unspecified organism: Secondary | ICD-10-CM | POA: Diagnosis not present

## 2014-09-13 DIAGNOSIS — A419 Sepsis, unspecified organism: Secondary | ICD-10-CM | POA: Diagnosis not present

## 2014-09-13 DIAGNOSIS — R590 Localized enlarged lymph nodes: Secondary | ICD-10-CM | POA: Diagnosis not present

## 2014-09-13 DIAGNOSIS — R197 Diarrhea, unspecified: Secondary | ICD-10-CM | POA: Diagnosis not present

## 2014-09-17 DIAGNOSIS — R590 Localized enlarged lymph nodes: Secondary | ICD-10-CM | POA: Diagnosis not present

## 2014-09-17 DIAGNOSIS — R197 Diarrhea, unspecified: Secondary | ICD-10-CM | POA: Diagnosis not present

## 2014-09-17 DIAGNOSIS — J189 Pneumonia, unspecified organism: Secondary | ICD-10-CM | POA: Diagnosis not present

## 2014-09-17 DIAGNOSIS — J439 Emphysema, unspecified: Secondary | ICD-10-CM | POA: Diagnosis not present

## 2014-09-17 DIAGNOSIS — A419 Sepsis, unspecified organism: Secondary | ICD-10-CM | POA: Diagnosis not present

## 2014-09-18 ENCOUNTER — Ambulatory Visit (INDEPENDENT_AMBULATORY_CARE_PROVIDER_SITE_OTHER)
Admission: RE | Admit: 2014-09-18 | Discharge: 2014-09-18 | Disposition: A | Discharge status: Home / Self Care | Payer: Commercial Managed Care - HMO | Source: Ambulatory Visit | Attending: Pulmonary Disease | Admitting: Pulmonary Disease

## 2014-09-18 DIAGNOSIS — J189 Pneumonia, unspecified organism: Secondary | ICD-10-CM

## 2014-09-18 DIAGNOSIS — J90 Pleural effusion, not elsewhere classified: Secondary | ICD-10-CM | POA: Diagnosis not present

## 2014-09-18 DIAGNOSIS — J869 Pyothorax without fistula: Secondary | ICD-10-CM | POA: Diagnosis not present

## 2014-09-18 DIAGNOSIS — J984 Other disorders of lung: Secondary | ICD-10-CM | POA: Diagnosis not present

## 2014-09-18 DIAGNOSIS — J948 Other specified pleural conditions: Secondary | ICD-10-CM

## 2014-09-18 DIAGNOSIS — J479 Bronchiectasis, uncomplicated: Secondary | ICD-10-CM | POA: Diagnosis not present

## 2014-09-18 DIAGNOSIS — I2510 Atherosclerotic heart disease of native coronary artery without angina pectoris: Secondary | ICD-10-CM | POA: Diagnosis not present

## 2014-09-19 DIAGNOSIS — J850 Gangrene and necrosis of lung: Secondary | ICD-10-CM | POA: Diagnosis not present

## 2014-09-19 DIAGNOSIS — D72829 Elevated white blood cell count, unspecified: Secondary | ICD-10-CM | POA: Diagnosis not present

## 2014-09-19 DIAGNOSIS — R197 Diarrhea, unspecified: Secondary | ICD-10-CM | POA: Diagnosis not present

## 2014-09-19 DIAGNOSIS — A419 Sepsis, unspecified organism: Secondary | ICD-10-CM | POA: Diagnosis not present

## 2014-09-19 DIAGNOSIS — J439 Emphysema, unspecified: Secondary | ICD-10-CM | POA: Diagnosis not present

## 2014-09-19 DIAGNOSIS — D473 Essential (hemorrhagic) thrombocythemia: Secondary | ICD-10-CM | POA: Diagnosis not present

## 2014-09-19 DIAGNOSIS — J869 Pyothorax without fistula: Secondary | ICD-10-CM | POA: Diagnosis not present

## 2014-09-19 DIAGNOSIS — J189 Pneumonia, unspecified organism: Secondary | ICD-10-CM | POA: Diagnosis not present

## 2014-09-19 DIAGNOSIS — R740 Nonspecific elevation of levels of transaminase and lactic acid dehydrogenase [LDH]: Secondary | ICD-10-CM | POA: Diagnosis not present

## 2014-09-19 DIAGNOSIS — R590 Localized enlarged lymph nodes: Secondary | ICD-10-CM | POA: Diagnosis not present

## 2014-09-19 DIAGNOSIS — J159 Unspecified bacterial pneumonia: Secondary | ICD-10-CM | POA: Diagnosis not present

## 2014-09-22 ENCOUNTER — Telehealth: Payer: Self-pay | Admitting: Pulmonary Disease

## 2014-09-22 NOTE — Telephone Encounter (Signed)
Pt is requesting results of her CT that was done on 09/18/14.  VS - please advise. Thanks.

## 2014-09-22 NOTE — Telephone Encounter (Signed)
CT chest 09/18/14 >> mild Rt pleural thickening, small Rt pleural effusion, 4 mm nodule LUL, improved RML consolidation, mild RML BTX, RLL cavity 2.5 cm from 3.1 cm, mild BTX lingula  Spoke with pt over phone.  She has Rt pleural effusion and decreased size of RLL cavity.  She has been off Abx for 8 days, and reports her symptoms continue to improve.  She still has dry cough, but otherwise denies chest pain/fever/sweats.  Her activity level is improving.  Will continue to monitor off Abx.  She has follow up on 10/09/14 >> will plan to repeat CXR then.  Discussed symptoms to monitor for, and advised her to call back if she is feeling worse.

## 2014-09-24 DIAGNOSIS — R590 Localized enlarged lymph nodes: Secondary | ICD-10-CM | POA: Diagnosis not present

## 2014-09-24 DIAGNOSIS — R197 Diarrhea, unspecified: Secondary | ICD-10-CM | POA: Diagnosis not present

## 2014-09-24 DIAGNOSIS — J189 Pneumonia, unspecified organism: Secondary | ICD-10-CM | POA: Diagnosis not present

## 2014-09-24 DIAGNOSIS — A419 Sepsis, unspecified organism: Secondary | ICD-10-CM | POA: Diagnosis not present

## 2014-09-24 DIAGNOSIS — J439 Emphysema, unspecified: Secondary | ICD-10-CM | POA: Diagnosis not present

## 2014-10-01 DIAGNOSIS — R590 Localized enlarged lymph nodes: Secondary | ICD-10-CM | POA: Diagnosis not present

## 2014-10-01 DIAGNOSIS — J439 Emphysema, unspecified: Secondary | ICD-10-CM | POA: Diagnosis not present

## 2014-10-01 DIAGNOSIS — A419 Sepsis, unspecified organism: Secondary | ICD-10-CM | POA: Diagnosis not present

## 2014-10-01 DIAGNOSIS — R197 Diarrhea, unspecified: Secondary | ICD-10-CM | POA: Diagnosis not present

## 2014-10-01 DIAGNOSIS — J189 Pneumonia, unspecified organism: Secondary | ICD-10-CM | POA: Diagnosis not present

## 2014-10-03 ENCOUNTER — Inpatient Hospital Stay: Payer: Medicare HMO | Admitting: Pulmonary Disease

## 2014-10-09 ENCOUNTER — Ambulatory Visit (INDEPENDENT_AMBULATORY_CARE_PROVIDER_SITE_OTHER): Payer: Commercial Managed Care - HMO | Admitting: Pulmonary Disease

## 2014-10-09 ENCOUNTER — Inpatient Hospital Stay: Payer: Commercial Managed Care - HMO | Admitting: Adult Health

## 2014-10-09 ENCOUNTER — Encounter: Payer: Self-pay | Admitting: Pulmonary Disease

## 2014-10-09 ENCOUNTER — Inpatient Hospital Stay: Payer: Commercial Managed Care - HMO | Admitting: Internal Medicine

## 2014-10-09 ENCOUNTER — Ambulatory Visit (INDEPENDENT_AMBULATORY_CARE_PROVIDER_SITE_OTHER)
Admission: RE | Admit: 2014-10-09 | Discharge: 2014-10-09 | Disposition: A | Discharge status: Home / Self Care | Payer: Commercial Managed Care - HMO | Source: Ambulatory Visit | Attending: Pulmonary Disease | Admitting: Pulmonary Disease

## 2014-10-09 VITALS — BP 118/78 | HR 114 | Ht 60.0 in | Wt 122.4 lb

## 2014-10-09 DIAGNOSIS — J4540 Moderate persistent asthma, uncomplicated: Secondary | ICD-10-CM | POA: Diagnosis not present

## 2014-10-09 DIAGNOSIS — A419 Sepsis, unspecified organism: Secondary | ICD-10-CM | POA: Diagnosis not present

## 2014-10-09 DIAGNOSIS — J189 Pneumonia, unspecified organism: Secondary | ICD-10-CM | POA: Diagnosis not present

## 2014-10-09 DIAGNOSIS — J449 Chronic obstructive pulmonary disease, unspecified: Secondary | ICD-10-CM | POA: Diagnosis not present

## 2014-10-09 DIAGNOSIS — J45909 Unspecified asthma, uncomplicated: Secondary | ICD-10-CM | POA: Diagnosis not present

## 2014-10-09 NOTE — Patient Instructions (Signed)
Follow up in 6 to 8 weeks  

## 2014-10-09 NOTE — Progress Notes (Signed)
Chief Complaint  Patient presents with  . Follow-up    Pt states that she feels breathing has improved since last OV. Pt reports using Ventolin about twice daily.     History of Present Illness: [name] is a 74 y.o. female never smoker with asthma.  She was in hospital in May and June 2016 for PNA and empyema.  Her breathing has been slowly improving.  She still doesn't feel like she is back to baseline, but better than before.  She still has cough, but not bringing up sputum.  Her cough is better after using ventolin.  She gets occasional discomfort in Rt chest, but this doesn't last long.   She denies fever, sweats, hemoptysis, gland swelling, or rash.  Tests: CXR 10/18/11>>Chronic scarring and volume loss in the right middle lobe and lingula similar to prior examinations Spirometry 12/07/11>>1.31 (71%), FEV1% 66 Exhaled nitric oxide 12/07/11>>8 ppb (normal) Lt heart cath 01/24/12 >> Normal coronaries, LVEDP 8 mmHg, EF 55% PFT 12/05/12 >> FEV1 1.49 (82%), FEV1% 69, FEF 25-75% 1.09 (69%), TLC 4.38 (85%), DLCO 87%, borderline BD response CT chest 08/02/14 >> RML consolidation, small Rt pleural effusion Echo 08/03/14 >> EF 50 to 55%, mild MR CT chest 08/09/14 >> Rt lower lobe thin walled cavity, scattered GGO and interlobular septal thickening Rt pleural fluid 08/08/14 >> glucose 59, protein <3, LDH 6800, WBC 16,640 (86%N), cytology negative for malignancy Rt pleural fluid 08/28/14 >> glucose 59, LDH 178, protein 3.5, WBC 303 (68%L) CT chest 09/18/14 >> mild Rt pleural thickening, small Rt pleural effusion, 4 mm nodule LUL, improved RML consolidation, mild RML BTX, RLL cavity 2.5 cm from 3.1 cm, mild BTX lingula  PMHx >> Macular degeneration, Breast cancer  PSHx, Medications, Allergies, Fhx, Shx reviewed.  Physical Exam: BP 118/78 mmHg  Pulse 114  Ht 5' (1.524 m)  Wt 122 lb 6.4 oz (55.52 kg)  BMI 23.90 kg/m2  SpO2 95%  General - No distress ENT - No sinus tenderness, no nasal  discharge, scalloped tongue, no oral exudate, no LAN Cardiac - s1s2 regular, no murmur Chest - no wheeze Back - no focal tenderness Abd - soft, non-tender Ext - no edema Neuro - normal strength Skin - no rashes Psych - normal mood, and behavior  Dg Chest 2 View  10/09/2014   CLINICAL DATA:  Follow-up of pneumonia, history of asthma, currently asymptomatic, history of breast malignancy.  EXAM: CHEST  2 VIEW  COMPARISON:  Chest X ray of September 11, 2014 and CT scan of the chest of September 18, 2014  FINDINGS: The lungs are well-expanded. There are increased lung markings at both bases greater on the right than on the left. Allowing for differences in positioning these are similar to those seen on June 16th. The heart and pulmonary vascularity are normal. The mediastinum is normal in width. There is a small right pleural effusion blunting the posterior costophrenic angle. The bony thorax exhibits no acute abnormality.  IMPRESSION: COPD-asthma. Persistent abnormal increased density at the right lung base predominantly anteriorly. This may reflect scarring given the patient is asymptomatic state but underlying infection is not excluded. There is a trace of pleural fluid remaining on the right. There is no alveolar infiltrate nor suspicious pulmonary nodule.   Electronically Signed   By: David  Martinique M.D.   On: 10/09/2014 14:08    Assessment/Plan:  Recurrent PNA with cavity development in RLL. Empyema. LAN >> likely reactive. Pulmonary nodule. Discussion: She continues to improve.  Her CXR has not progressed off of ABx.  I think her CXR findings might represent scarring. Plan: - monitor off abx - will f/u in 6 to 8 weeks with CXR - she will need repeat CT chest/abd/pelvis at some point to f/u on LAN >> defer for now  Asthma. Plan: - Continue symbicort and prn albuterol  Allergic rhinitis. Plan: - continue allegra, flonase, astelin per PCP   [name], MD [facility]Critical  Care/Sleep Pager:  [phone] 10/09/2014, 2:18 PM

## 2014-10-15 ENCOUNTER — Telehealth: Payer: Self-pay | Admitting: Pulmonary Disease

## 2014-10-15 ENCOUNTER — Encounter: Payer: Self-pay | Admitting: Internal Medicine

## 2014-10-15 ENCOUNTER — Ambulatory Visit (INDEPENDENT_AMBULATORY_CARE_PROVIDER_SITE_OTHER): Payer: Commercial Managed Care - HMO | Admitting: Internal Medicine

## 2014-10-15 VITALS — BP 126/80 | HR 110 | Ht 60.0 in | Wt 121.0 lb

## 2014-10-15 DIAGNOSIS — J189 Pneumonia, unspecified organism: Secondary | ICD-10-CM | POA: Diagnosis not present

## 2014-10-15 DIAGNOSIS — R0781 Pleurodynia: Secondary | ICD-10-CM

## 2014-10-15 MED ORDER — HYDROCODONE-ACETAMINOPHEN 5-325 MG PO TABS: 1.0000 | ORAL_TABLET | Freq: Four times a day (QID) | ORAL | Status: DC | PRN

## 2014-10-15 NOTE — Telephone Encounter (Signed)
Called pt and appt scheduled to see MW this afternoon. Nothing further needed

## 2014-10-15 NOTE — Progress Notes (Signed)
Subjective:    Patient ID: [name], female    DOB: 07-May-1940, 74 y.o.   MRN: [id number]    Brief patient profile:  51 never smoker with asthma.    09/05/2014 post hospital follow-up Patient was admitted June 1 through June 6 for Sepsis secondary to healthcare associated pneumonia with empyema and acute hypoxic respiratory failure Patient was recently admitted prior to this admission for pneumonia with empyema. She was treated with Zosyn and discharged on Augmentin. After discharge. Patient has significant weakness and fatigue and was readmitted on June 1 with sepsis. She had a repeat thoracentesis on June 2 and found to have exudative pleural effusion. She was treated with IV anabiotic's. Pleural fluid culture was negative. CT chest on May 14 showed no mediastinal or hilar lymphadenopathy. Improvement in the right lower lobe consolidation, thin walled cavitary lesion in the anterior right lower lobe. Scattered groundglass opacities. She did have diarrhea. CT abdomen showed central mesenteric adenopathy and has been recommended for outpatient follow-up with an MRI. Patient was discharged on Zyvox .    Says she has been having more dyspnea/DOE with leg swelling . Feels tight like her asthma is acting up . Took albuterol inhaler which helped. Given xopenex neb in office with improvement . Was called in prednisone yesterday but did not start.  Had ven doppler last week neg for dvt.  No cough , discolored mucus or fever.  Has nausea with low appetite . We discussed increasing intake.  Diarrhea is better.   Labs reviewed in hospital showed abnormal LFT and protein electrophoresis and urine IFE .  She has appointment next week with PFT      Tests: CXR 10/18/11>>Chronic scarring and volume loss in the right middle lobe and lingula similar to prior examinations Spirometry 12/07/11>>1.31 (71%), FEV1% 66 Exhaled nitric oxide 12/07/11>>8 ppb (normal) Lt heart cath 01/24/12 >> Normal  coronaries, LVEDP 8 mmHg, EF 55% PFT 12/05/12 >> FEV1 1.49 (82%), FEV1% 69, FEF 25-75% 1.09 (69%), TLC 4.38 (85%), DLCO 87%, borderline BD response   Admit date: 08/27/2014 Discharge date: 09/01/2014  Recommendations for Outpatient Follow-up:  1. Resolution of pneumonia, see discussion below. As close outpatient follow-up with pulmonology this week. 1. Followup anemia, thrombocytosis, leukocytosis as an outpatient. 2. Follow-up central mesenteric adenopathy with regional inflammatory/edematous changes seen on CT 5/7. Reactive mesenteric adenitis and lymphoma would be the primary considerations. Significance unclear. This was discussed in detail with the patient. Recommend chest CT in the near future once pneumonia has resolved.  Follow-up Information    Follow up with PARRETT,TAMMY, NP On 09/05/2014.   Specialty: Nurse Practitioner   Why: 3-30 with CXR   Contact information:   [address] [address] [phone]      Discharge Diagnoses:  1. Sepsis secondary to healthcare associated pneumonia with empyema  2. Acute hypoxic respiratory failure  3. Nausea, vomiting  4. Anemia of chronic disease  5. 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 Leukocytosis, thrombocytosis 7. Transaminitis 8. Moderate malnutrition        10/15/2014 acute  ov/Wert re: new cp  Chief Complaint  Patient presents with  . Acute Visit    Pt c/o left side back pain x 4 days- unable to sleep due to pain. Her breathing is unchanged since her visit 10/09/14.   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 No unusual exp hx or h/o childhood pna/ asthma or knowledge of premature birth.  Sleeping ok without nocturnal  or early am exacerbation  of respiratory  c/o's or need for noct saba. Also denies any obvious fluctuation of symptoms with weather or environmental changes or other aggravating or alleviating factors except as outlined above   Current Medications, Allergies, Complete Past Medical History, Past Surgical History, Family History, and Social History were reviewed in [employer] record.  ROS  The following are not active complaints unless bolded sore throat, dysphagia, dental problems, itching, sneezing,  nasal congestion or excess/ purulent secretions, ear ache,   fever, chills, sweats, unintended wt loss, classically  exertional cp, hemoptysis,  orthopnea pnd or leg swelling, presyncope, palpitations, abdominal pain, anorexia, nausea, vomiting, diarrhea  or change in bowel or bladder habits, change in stools or urine, dysuria,hematuria,  rash, arthralgias, visual complaints, headache, numbness, weakness or ataxia or problems with walking or coordination,  change in mood/affect or memory.            Objective:   Physical Exam GEN: A/Ox3; pleasant , NAD, frail and elderly   Wt Readings from Last 3 Encounters:  10/15/14 121 lb (54.885 kg)  10/09/14 122 lb 6.4 oz (55.52 kg)  09/11/14 123 lb 9.6 oz (56.065 kg)    Vital signs reviewed    HEENT:  [place]/AT,  EACs-clear, TMs-wnl, NOSE-clear, THROAT-clear, no lesions, no postnasal drip or exudate noted.   NECK:  Supple w/ fair ROM; no JVD; normal carotid impulses w/o bruits; no thyromegaly or nodules palpated; no lymphadenopathy.  RESP  Decreased BS  no accessory muscle use, no dullness to percussion/ no rash over chest or chest wall tenderness   CARD:  RRR, no m/r/g  , pulses intact, no cyanosis or clubbing - no calf tenderness, no edema   GI:   Soft & nt; nml bowel sounds; no organomegaly or masses  detected.  Musco: Warm bil, no deformities or joint swelling noted.   Neuro: alert, no focal deficits noted.    Skin: Warm, no lesions or rashes     I personally reviewed images and agree with radiology impression as follows:  CTa chest   10/16/2014  The previously noted cavitary lesion in the anterior segment of the right lower lobe is present currently but no longer appears cavitated. Suspect focal area of pneumonia as most likely. As this area has changed compared to study from 1 month prior, a noncontrast enhanced chest CT study in 4-6 weeks to assess for stability is warranted. There is volume loss in the right middle lobe and to a lesser extent in the inferior lingula with bronchiectatic change in these areas, stable. There is stable pleural thickening at the right base with atelectatic change in scarring.    Assessment & Plan:

## 2014-10-15 NOTE — Patient Instructions (Addendum)
Please see patient coordinator before you leave today  to schedule CTangiogram and we will call you with results  norco 5 mg  Take 1 every 4 hours as needed for pain

## 2014-10-16 ENCOUNTER — Ambulatory Visit (INDEPENDENT_AMBULATORY_CARE_PROVIDER_SITE_OTHER)
Admission: RE | Admit: 2014-10-16 | Discharge: 2014-10-16 | Disposition: A | Discharge status: Home / Self Care | Payer: Commercial Managed Care - HMO | Source: Ambulatory Visit | Attending: Internal Medicine | Admitting: Internal Medicine

## 2014-10-16 ENCOUNTER — Encounter: Payer: Self-pay | Admitting: Internal Medicine

## 2014-10-16 DIAGNOSIS — R0781 Pleurodynia: Secondary | ICD-10-CM

## 2014-10-16 DIAGNOSIS — J9811 Atelectasis: Secondary | ICD-10-CM | POA: Diagnosis not present

## 2014-10-16 DIAGNOSIS — J479 Bronchiectasis, uncomplicated: Secondary | ICD-10-CM | POA: Diagnosis not present

## 2014-10-16 MED ORDER — IOHEXOL 350 MG/ML SOLN
100.0000 mL | Freq: Once | INTRAVENOUS | Status: AC | PRN
  Administered 2014-10-16: 100 mL via INTRAVENOUS

## 2014-10-16 NOTE — Progress Notes (Signed)
Quick Note:  Spoke with pt and notified of results per Dr. Wert. Pt verbalized understanding and denied any questions.  ______ 

## 2014-10-16 NOTE — Assessment & Plan Note (Addendum)
Most likely this is just a mscp from lifting but CTa was needed based on her hx esp since had recent prolonged hosp admit with new pain on opposite side from her pna.  I had an extended discussion with the patient reviewing all relevant studies completed to date and  lasting 15 to 20 minutes of a 25 minute visit   rec heat/ norco since no better with mobic and observe for shingles rash  Each maintenance medication was reviewed in detail including most importantly the difference between maintenance and as needed and under what circumstances the prns are to be used.  Please see instructions for details which were reviewed in writing and the patient given a copy.

## 2014-10-20 ENCOUNTER — Encounter: Payer: Self-pay | Admitting: Internal Medicine

## 2014-10-20 ENCOUNTER — Telehealth: Payer: Self-pay | Admitting: Pulmonary Disease

## 2014-10-20 NOTE — Assessment & Plan Note (Addendum)
See CT chest 10/16/14 > cavity resolves, appears to be healing / f/u Dr Halford Chessman

## 2014-10-20 NOTE — Telephone Encounter (Signed)
Please advise her to try taking ibuprofen 400 to 600 mg every 8 hours as needed.  If she should not take this for more than 3 days >> if her symptoms are getting better, then she should call back again.

## 2014-10-20 NOTE — Telephone Encounter (Signed)
Pt saw MW 10/15/14 for acute visit and was given RX for norco 5-325 mg Take 1 every 4 hours as needed for pain #30 x 0 refills Pt reports she is still having the back pain and is not going aware. She is currently in [place]. Wants to know if she can take something else.  Please advise VS thanks

## 2014-10-20 NOTE — Telephone Encounter (Signed)
I called and made pt aware of below. Nothing further needed

## 2014-10-22 ENCOUNTER — Telehealth: Payer: Self-pay | Admitting: Pulmonary Disease

## 2014-10-22 MED ORDER — CYCLOBENZAPRINE HCL 10 MG PO TABS: 10.0000 mg | ORAL_TABLET | Freq: Four times a day (QID) | ORAL | Status: DC | PRN

## 2014-10-22 NOTE — Telephone Encounter (Signed)
Pt aware of rec's per MW Flexeril has been called into [facility] [place].  Nothing further needed.

## 2014-10-22 NOTE — Telephone Encounter (Signed)
Spoke with pt.  She seen MW on 10/15/14 for left side back pain and was given rx for norco.  Pt has 1 day left of this.  Still c/o back pain but now has moved to mid and upper back as well.  Increased pain with standing.  Denies diff urinating.  Pt was unable to take Ibuprofen that VS advised on 10/20/14 due to her being on Mobic 15mg /day.  She is out of town in [place] until Saturday and is requesting a refill on norco or possibly a muscle relaxer until she can get home.  Please advise.

## 2014-10-22 NOTE — Telephone Encounter (Signed)
Can try heat and or flexoril 10 mg qid prn #40 no refills but will need to have this checked out asap as no explanation for it on the studies we did and is acting less like a simple muscle strain which is what we though it was

## 2014-10-24 DIAGNOSIS — M62830 Muscle spasm of back: Secondary | ICD-10-CM | POA: Diagnosis not present

## 2014-10-24 DIAGNOSIS — M533 Sacrococcygeal disorders, not elsewhere classified: Secondary | ICD-10-CM | POA: Diagnosis not present

## 2014-10-24 DIAGNOSIS — M545 Low back pain: Secondary | ICD-10-CM | POA: Diagnosis not present

## 2014-10-24 DIAGNOSIS — N390 Urinary tract infection, site not specified: Secondary | ICD-10-CM | POA: Diagnosis not present

## 2014-10-29 ENCOUNTER — Ambulatory Visit (INDEPENDENT_AMBULATORY_CARE_PROVIDER_SITE_OTHER): Payer: Commercial Managed Care - HMO | Admitting: Pulmonary Disease

## 2014-10-29 ENCOUNTER — Encounter: Payer: Self-pay | Admitting: Pulmonary Disease

## 2014-10-29 VITALS — BP 142/82 | HR 105 | Ht 60.0 in | Wt 119.6 lb

## 2014-10-29 DIAGNOSIS — J189 Pneumonia, unspecified organism: Secondary | ICD-10-CM

## 2014-10-29 DIAGNOSIS — R590 Localized enlarged lymph nodes: Secondary | ICD-10-CM | POA: Diagnosis not present

## 2014-10-29 NOTE — Progress Notes (Signed)
   Subjective:    Patient ID: [name], female    DOB: September 23, 1940, 74 y.o.   MRN: [id number]  HPI  59 never smoker with asthma and recent pneumonia/parapneumonic effusion   She was admitted June 1 through June 6 for Sepsis secondary to healthcare associated pneumonia with empyema and acute hypoxic respiratory failure Patient was recently admitted 07/2014 prior to this admission for pneumonia with empyema. She was treated with Zosyn and discharged on Augmentin. After discharge. Patient has significant weakness and fatigue and was readmitted on June 1 with sepsis. She had a repeat thoracentesis on June 2 and found to have exudative pleural effusion. She was treated with IV anabiotic's. Pleural fluid culture was negative.  Labs reviewed in hospital showed abnormal LFT and protein electrophoresis and urine IFE .    10/29/2014  Chief Complaint  Patient presents with  . Acute Visit    Patient came in for back pain, was given pain medicaiton, it did not work; called office and was given muscle relaxer, by Thursday, sick with pain.  Went to Urgent care and they gave her a DepoMedrol injection, it took the pain away.  started Cipro for UTI. back pain came back.       Pt saw MW 10/15/14 for acute visit and was given RX for norco 5-325 mg >> still having the back pain and is not going aware >> ibuprofen/ flexeril .Marland Kitchen Went to UC in [place] - urine tested, given depomedrol shot - UTI, given cipro Rx  She complains of persistent pain radiating from her left hip to both loins, this does get worse with walking. She denies burning micturition or fevers. She reports that her medications are causing her tremors and increased nervousness. She is noted to be tachycardic today  Tests: CXR 10/18/11>>Chronic scarring and volume loss in the right middle lobe and lingula similar to prior examinations Spirometry 12/07/11>>1.31 (71%), FEV1% 66 Exhaled nitric oxide 12/07/11>>8 ppb (normal) Lt heart cath 01/24/12  >> Normal coronaries, LVEDP 8 mmHg, EF 55% PFT 12/05/12 >> FEV1 1.49 (82%), FEV1% 69, FEF 25-75% 1.09 (69%), TLC 4.38 (85%), DLCO 87%, borderline BD response  CT chest 08/09/14 showed no mediastinal or hilar lymphadenopathy. Improvement in the right lower lobe consolidation, thin walled cavitary lesion in the anterior right lower lobe. Scattered groundglass opacities. CT abdomen 07/2014 showed central mesenteric adenopathy .   CT angios chest 10/16/14 no PE, cavitary lesion resolved and now nodular appearing, 4 mm nodule left apex stable  Review of Systems neg for any significant sore throat, dysphagia, itching, sneezing, nasal congestion or excess/ purulent secretions, fever, chills, sweats, unintended wt loss, pleuritic or exertional cp, hempoptysis, orthopnea pnd or change in chronic leg swelling. Also denies presyncope, palpitations, heartburn, abdominal pain, nausea, vomiting, diarrhea or change in bowel or urinary habits, dysuria,hematuria, rash, arthralgias, visual complaints, headache, numbness weakness or ataxia.     Objective:   Physical Exam  Gen. Pleasant, well-nourished, in no distress, normal affect ENT - no lesions, no post nasal drip Neck: No JVD, no thyromegaly, no carotid bruits Lungs: no use of accessory muscles, no dullness to percussion, clear without rales or rhonchi  Cardiovascular: Rhythm regular, heart sounds  normal, no murmurs or gallops, no peripheral edema Abdomen: soft and non-tender, no hepatosplenomegaly, BS normal. Musculoskeletal: No deformities, no cyanosis or clubbing Neuro:  alert, non focal       Assessment & Plan:

## 2014-10-29 NOTE — Patient Instructions (Signed)
Complete course of cipro You will need FU CT abdomen for lymph nodes  Call us to schedule prior t appt with dr Halford Chessman - can do sooner if pain persists Albuterol can cause tremors & heart racing

## 2014-10-29 NOTE — Assessment & Plan Note (Signed)
Appears to be resolving, cavitary infiltrate has resolved into a nodular lesion-this will need follow-up scan in 3-6 months for resolution. Since this is of acute onset, malignancy is not a concern here

## 2014-10-29 NOTE — Assessment & Plan Note (Signed)
Unclear cause of her back pain Complete course of cipro She will need FU CT abdomen for lymph nodes  - can do sooner if pain persists

## 2014-10-30 ENCOUNTER — Other Ambulatory Visit: Payer: Self-pay | Admitting: Pulmonary Disease

## 2014-10-30 ENCOUNTER — Telehealth: Payer: Self-pay | Admitting: Pulmonary Disease

## 2014-10-30 DIAGNOSIS — J189 Pneumonia, unspecified organism: Secondary | ICD-10-CM

## 2014-10-30 NOTE — Telephone Encounter (Signed)
Spoke with pt, states that she came in yesterday for her back and hip pain.  States she was given Depo 80 for her back and hip pain recently at an urgent care, which helped her hip pain for several days.  Pt states she mentioned this yesterday and was not given the injection, and wondered why.  I advised that this was made aware to RA yesterday, but we can't treat her hip pain as her pulmonologist.  I advised pt to follow up with her PCP for chronic pain.  Pt understands this.  Nothing further needed at this time.

## 2014-10-31 ENCOUNTER — Ambulatory Visit
Admission: RE | Admit: 2014-10-31 | Discharge: 2014-10-31 | Disposition: A | Discharge status: Home / Self Care | Payer: Commercial Managed Care - HMO | Source: Ambulatory Visit | Attending: Family Medicine | Admitting: Family Medicine

## 2014-10-31 ENCOUNTER — Other Ambulatory Visit: Payer: Self-pay | Admitting: Family Medicine

## 2014-10-31 DIAGNOSIS — M545 Low back pain: Secondary | ICD-10-CM

## 2014-10-31 DIAGNOSIS — M5136 Other intervertebral disc degeneration, lumbar region: Secondary | ICD-10-CM | POA: Diagnosis not present

## 2014-10-31 DIAGNOSIS — M791 Myalgia: Secondary | ICD-10-CM | POA: Diagnosis not present

## 2014-11-03 ENCOUNTER — Inpatient Hospital Stay: Admission: RE | Admit: 2014-11-03 | Payer: Commercial Managed Care - HMO | Source: Ambulatory Visit

## 2014-11-10 DIAGNOSIS — B373 Candidiasis of vulva and vagina: Secondary | ICD-10-CM | POA: Diagnosis not present

## 2014-11-10 DIAGNOSIS — R8290 Unspecified abnormal findings in urine: Secondary | ICD-10-CM | POA: Diagnosis not present

## 2014-11-24 ENCOUNTER — Other Ambulatory Visit: Payer: Commercial Managed Care - HMO

## 2014-11-24 DIAGNOSIS — J189 Pneumonia, unspecified organism: Secondary | ICD-10-CM

## 2014-11-24 LAB — COMPREHENSIVE METABOLIC PANEL
ALBUMIN: 3.9 g/dL (ref 3.5–5.2)
ALT: 19 U/L (ref 0–35)
AST: 21 U/L (ref 0–37)
Alkaline Phosphatase: 43 U/L (ref 39–117)
BUN: 16 mg/dL (ref 6–23)
CHLORIDE: 106 meq/L (ref 96–112)
CO2: 27 meq/L (ref 19–32)
CREATININE: 0.61 mg/dL (ref 0.40–1.20)
Calcium: 9.5 mg/dL (ref 8.4–10.5)
GFR: 101.99 mL/min (ref >60.00)
Glucose, Bld: 94 mg/dL (ref 70–99)
POTASSIUM: 4.1 meq/L (ref 3.5–5.1)
Sodium: 141 mEq/L (ref 135–145)
Total Bilirubin: 0.3 mg/dL (ref 0.2–1.2)
Total Protein: 6.7 g/dL (ref 6.0–8.3)

## 2014-11-28 ENCOUNTER — Ambulatory Visit (INDEPENDENT_AMBULATORY_CARE_PROVIDER_SITE_OTHER)
Admission: RE | Admit: 2014-11-28 | Discharge: 2014-11-28 | Disposition: A | Discharge status: Home / Self Care | Payer: Commercial Managed Care - HMO | Source: Ambulatory Visit | Attending: Pulmonary Disease | Admitting: Pulmonary Disease

## 2014-11-28 DIAGNOSIS — K7689 Other specified diseases of liver: Secondary | ICD-10-CM | POA: Diagnosis not present

## 2014-11-28 DIAGNOSIS — J189 Pneumonia, unspecified organism: Secondary | ICD-10-CM

## 2014-11-28 DIAGNOSIS — R590 Localized enlarged lymph nodes: Secondary | ICD-10-CM | POA: Diagnosis not present

## 2014-11-28 MED ORDER — IOHEXOL 300 MG/ML  SOLN
100.0000 mL | Freq: Once | INTRAMUSCULAR | Status: AC | PRN
  Administered 2014-11-28: 100 mL via INTRAVENOUS

## 2014-12-02 ENCOUNTER — Encounter: Payer: Self-pay | Admitting: Pulmonary Disease

## 2014-12-02 ENCOUNTER — Ambulatory Visit (INDEPENDENT_AMBULATORY_CARE_PROVIDER_SITE_OTHER): Payer: Commercial Managed Care - HMO | Admitting: Pulmonary Disease

## 2014-12-02 VITALS — BP 130/64 | HR 93 | Temp 97.7°F | Ht 60.0 in | Wt 119.0 lb

## 2014-12-02 DIAGNOSIS — R590 Localized enlarged lymph nodes: Secondary | ICD-10-CM | POA: Diagnosis not present

## 2014-12-02 DIAGNOSIS — J189 Pneumonia, unspecified organism: Secondary | ICD-10-CM

## 2014-12-02 DIAGNOSIS — J4540 Moderate persistent asthma, uncomplicated: Secondary | ICD-10-CM

## 2014-12-02 DIAGNOSIS — Z23 Encounter for immunization: Secondary | ICD-10-CM | POA: Diagnosis not present

## 2014-12-02 MED ORDER — OXYCODONE-ACETAMINOPHEN 5-325 MG PO TABS: 1.0000 | ORAL_TABLET | Freq: Four times a day (QID) | ORAL | Status: DC | PRN

## 2014-12-02 NOTE — Progress Notes (Signed)
Chief Complaint  Patient presents with  . Follow-up    Discuss CT results; CT done 11/28/14; not sure if she wants flu shot    History of Present Illness: [name] is a 75 y.o. female never smoker with asthma.  She was in hospital in May and June 2016 for PNA and empyema.  She was seen by Dr. Melvyn Novas and Dr. Elsworth Soho since I saw her last.  She had CT chest and then CT abdomen.  Each one showed gradual improvement in her pneumonia.  She is not having cough, wheeze, sputum, hemoptysis, or chest pain.  She is not needing to use her rescue inhaler much.  Her main concern is related to back pain.  This is in her lower back.  She feels it radiates to both of her thighs.  This causes trouble with walking and sitting.  She was given script for percocet and this helped, but she is almost out of percocet.  Tests: Spirometry 12/07/11>>1.31 (71%), FEV1% 66 Exhaled nitric oxide 12/07/11>>8 ppb (normal) Lt heart cath 01/24/12 >> Normal coronaries, LVEDP 8 mmHg, EF 55% PFT 12/05/12 >> FEV1 1.49 (82%), FEV1% 69, FEF 25-75% 1.09 (69%), TLC 4.38 (85%), DLCO 87%, borderline BD response CT chest 08/02/14 >> RML consolidation, small Rt pleural effusion Echo 08/03/14 >> EF 50 to 55%, mild MR CT chest 08/09/14 >> Rt lower lobe thin walled cavity, scattered GGO and interlobular septal thickening Rt pleural fluid 08/08/14 >> glucose 59, protein <3, LDH 6800, WBC 16,640 (86%N), cytology negative for malignancy Rt pleural fluid 08/28/14 >> glucose 59, LDH 178, protein 3.5, WBC 303 (68%L) CT chest 09/18/14 >> mild Rt pleural thickening, small Rt pleural effusion, 4 mm nodule LUL, improved RML consolidation, mild RML BTX, RLL cavity 2.5 cm from 3.1 cm, mild BTX lingula CT chest 10/16/14 >> decreased RLL PNA w/o cavity  PMHx >> Macular degeneration, Breast cancer  PSHx, Medications, Allergies, Fhx, Shx reviewed.  Physical Exam: BP 130/64 mmHg  Pulse 93  Temp(Src) 97.7 F (36.5 C) (Oral)  Ht 5' (1.524 m)  Wt 119 lb (53.978  kg)  BMI 23.24 kg/m2  SpO2 99%  General - No distress ENT - No sinus tenderness, no nasal discharge, scalloped tongue, no oral exudate, no LAN Cardiac - s1s2 regular, no murmur Chest - no wheeze Back - no focal tenderness Abd - soft, non-tender Ext - no edema Neuro - normal strength Skin - no rashes Psych - normal mood, and behavior   Ct Abdomen Pelvis W Contrast  11/28/2014   CLINICAL DATA:  Recent pneumonia. Abdominal CT previously revealed mildly prominent mesenteric lymph nodes and inflammatory changes of the mesentery. Cystic hepatic lesions were also identified.  EXAM: CT ABDOMEN AND PELVIS WITH CONTRAST  TECHNIQUE: Multidetector CT imaging of the abdomen and pelvis was performed using the standard protocol following bolus administration of intravenous contrast.  CONTRAST:  155mL OMNIPAQUE IOHEXOL 300 MG/ML  SOLN  COMPARISON:  CT of the abdomen and pelvis without contrast on 08/02/2014  FINDINGS: Cystic lesions of the liver are better circumscribed on the contrast enhanced study compared to the prior study. Irregular shaped cystic area in the posterior right lobe at the juncture of segment 5 and 6 measures 1.6 x 1.6 cm and demonstrates internal density of 7 Hounsfield units. This likely represents a cyst. Delayed imaging shows no obvious enhancement. A cystic area measuring 1.2 x 1.6 cm in the left lobe in segment 4A demonstrates internal density of 8 Hounsfield units and shows  no enhancement on delayed images. 1.3 x 1.4 cm low-attenuation lesion in the anterior aspect of segment 2 demonstrates internal density of 32 Hounsfield units on the venous phase and 18 Hounsfield units on the delayed phase. This lesion again demonstrates partial peripheral calcification. Additional tiny 0.4 cm cystic area present segment 4B.  Stable nonspecific edema in the central mesentery with associated small mesenteric lymph nodes. Largest lymph nodes measure 0.7 cm in short axis and are not overtly enlarged. No  associated bowel wall thickening, obstruction or ileus identified. No abnormal fluid collections.  Some punctate densities in the gallbladder likely represent gallstones. No evidence of biliary obstruction. The pancreas, spleen, adrenal glands and kidneys are unremarkable. There is a small left retroperitoneal lymph node near the renal artery measuring 0.7 cm. This is nonenlarged. The bladder is unremarkable. The uterus has been removed. Lung bases show bibasilar scarring as well as pleural thickening on the right. Bony structures are unremarkable.  IMPRESSION: 1. Multiple low-attenuation liver lesions, as above. Most of these are favored to represent benign cysts. A single higher density lesion in segment 2 is not clearly of simple cystic density and demonstrates partial peripheral calcification. This shows no evidence of growth since May. Considerations for follow-up include MRI evaluation versus follow-up CT. MRI would be more definitive in characterizing lesions as benign or needing follow-up. 2. Stable nonspecific edema in the central mesentery with associated nonenlarged mesenteric lymph nodes. 3. Cholelithiasis.   Electronically Signed   By: Aletta Edouard M.D.   On: 11/28/2014 11:47     Assessment/Plan:  Recurrent PNA with cavity development in RLL. Empyema. LAN >> likely reactive. Pulmonary nodule. Discussion: Improved on CT and clinically. Plan: - monitor off abx  Asthma. Plan: - Continue symbicort and prn albuterol  Allergic rhinitis. Plan: - continue allegra, flonase, astelin per PCP  Back pain. Plan: - will refill her percocet - advised her to d/w her PCP  She got influenza vaccine today.   [name], MD [facility] Pager:  [phone] 12/02/2014, 10:26 AM

## 2014-12-02 NOTE — Patient Instructions (Signed)
Flu shot today ° °Follow up in 4 months °

## 2014-12-09 DIAGNOSIS — D72829 Elevated white blood cell count, unspecified: Secondary | ICD-10-CM | POA: Diagnosis not present

## 2014-12-09 DIAGNOSIS — Z9013 Acquired absence of bilateral breasts and nipples: Secondary | ICD-10-CM | POA: Diagnosis not present

## 2014-12-09 DIAGNOSIS — M549 Dorsalgia, unspecified: Secondary | ICD-10-CM | POA: Diagnosis not present

## 2014-12-09 DIAGNOSIS — M899 Disorder of bone, unspecified: Secondary | ICD-10-CM | POA: Diagnosis not present

## 2014-12-09 DIAGNOSIS — Z0000 Encounter for general adult medical examination without abnormal findings: Secondary | ICD-10-CM | POA: Diagnosis not present

## 2014-12-09 DIAGNOSIS — Z853 Personal history of malignant neoplasm of breast: Secondary | ICD-10-CM | POA: Diagnosis not present

## 2014-12-09 DIAGNOSIS — J45909 Unspecified asthma, uncomplicated: Secondary | ICD-10-CM | POA: Diagnosis not present

## 2014-12-09 DIAGNOSIS — J309 Allergic rhinitis, unspecified: Secondary | ICD-10-CM | POA: Diagnosis not present

## 2014-12-15 DIAGNOSIS — M549 Dorsalgia, unspecified: Secondary | ICD-10-CM | POA: Diagnosis not present

## 2014-12-15 DIAGNOSIS — D692 Other nonthrombocytopenic purpura: Secondary | ICD-10-CM | POA: Diagnosis not present

## 2015-02-25 DIAGNOSIS — J45909 Unspecified asthma, uncomplicated: Secondary | ICD-10-CM | POA: Diagnosis not present

## 2015-02-25 DIAGNOSIS — J209 Acute bronchitis, unspecified: Secondary | ICD-10-CM | POA: Diagnosis not present

## 2015-04-01 ENCOUNTER — Ambulatory Visit: Payer: Commercial Managed Care - HMO | Admitting: Adult Health

## 2015-04-14 ENCOUNTER — Encounter: Payer: Self-pay | Admitting: Adult Health

## 2015-04-14 ENCOUNTER — Ambulatory Visit (INDEPENDENT_AMBULATORY_CARE_PROVIDER_SITE_OTHER)
Admission: RE | Admit: 2015-04-14 | Discharge: 2015-04-14 | Disposition: A | Discharge status: Home / Self Care | Payer: Commercial Managed Care - HMO | Source: Ambulatory Visit | Attending: Adult Health | Admitting: Adult Health

## 2015-04-14 ENCOUNTER — Ambulatory Visit (INDEPENDENT_AMBULATORY_CARE_PROVIDER_SITE_OTHER): Payer: Commercial Managed Care - HMO | Admitting: Adult Health

## 2015-04-14 VITALS — BP 116/74 | HR 88 | Temp 98.0°F | Ht 60.0 in | Wt 129.0 lb

## 2015-04-14 DIAGNOSIS — J4540 Moderate persistent asthma, uncomplicated: Secondary | ICD-10-CM | POA: Diagnosis not present

## 2015-04-14 DIAGNOSIS — J189 Pneumonia, unspecified organism: Secondary | ICD-10-CM

## 2015-04-14 DIAGNOSIS — R590 Localized enlarged lymph nodes: Secondary | ICD-10-CM

## 2015-04-14 DIAGNOSIS — R05 Cough: Secondary | ICD-10-CM | POA: Diagnosis not present

## 2015-04-14 NOTE — Progress Notes (Signed)
Reviewed and agree with assessment/plan. 

## 2015-04-14 NOTE — Progress Notes (Signed)
Subjective:    Patient ID: [name], female    DOB: 09/22/1940, 75 y.o.   MRN: [id number]  HPI  75 yo female  never smoker with asthma , PNA and Empyema     04/14/2015 Follow up : Ashtma/PNA/Empyema Pt returns for 4 month follow up .  Says she is getting better was able to walk >0.42mile and jogged briefly.  Remains on Symbicort . No flare of cough or wheezing .  Tx for HCAP, exuadative pleural effusion s/p thoracentesis x 2  in June 2016 post hospital follow-up  Follow up CT chest 09/2014 with decreased RLL PNA. W/o cavity.  CXR today shows BB atx with RML /lingular densities  We discussed a follow up CT chest  Denies any chest pain, orthopnea, PND, or increased leg swelling.   Tests: Spirometry 12/07/11>>1.31 (71%), FEV1% 66 Exhaled nitric oxide 12/07/11>>8 ppb (normal) Lt heart cath 01/24/12 >> Normal coronaries, LVEDP 8 mmHg, EF 55% PFT 12/05/12 >> FEV1 1.49 (82%), FEV1% 69, FEF 25-75% 1.09 (69%), TLC 4.38 (85%), DLCO 87%, borderline BD response CT chest 08/02/14 >> RML consolidation, small Rt pleural effusion Echo 08/03/14 >> EF 50 to 55%, mild MR CT chest 08/09/14 >> Rt lower lobe thin walled cavity, scattered GGO and interlobular septal thickening Rt pleural fluid 08/08/14 >> glucose 59, protein <3, LDH 6800, WBC 16,640 (86%N), cytology negative for malignancy Rt pleural fluid 08/28/14 >> glucose 59, LDH 178, protein 3.5, WBC 303 (68%L) CT chest 09/18/14 >> mild Rt pleural thickening, small Rt pleural effusion, 4 mm nodule LUL, improved RML consolidation, mild RML BTX, RLL cavity 2.5 cm from 3.1 cm, mild BTX lingula CT chest 10/16/14 >> decreased RLL PNA w/o cavity   Past Medical History  Diagnosis Date  . Asthma   . Macular degeneration   . Hearing loss     both ears, wwears hearing aides  . Osteopenia   . Arthritis   . Cancer of breast ([place])     33 years ago, left,    Current Outpatient Prescriptions on File Prior to Visit  Medication Sig Dispense Refill  . albuterol  (PROAIR HFA) 108 (90 BASE) MCG/ACT inhaler Inhale 2 puffs into the lungs every 6 (six) hours as needed for wheezing. 8 g 5  . azelastine (ASTELIN) 137 MCG/SPRAY nasal spray Place 1 spray into the nose at bedtime. Use in each nostril as directed    . budesonide-formoterol (SYMBICORT) 80-4.5 MCG/ACT inhaler Inhale 2 puffs into the lungs 2 (two) times daily.    . calcium carbonate (TUMS) 500 MG chewable tablet Chew 1 tablet by mouth at bedtime.     . Cholecalciferol (VITAMIN D) 1000 UNITS capsule Take 1,000 Units by mouth daily with breakfast.     . fexofenadine (ALLEGRA) 180 MG tablet Take 180 mg by mouth daily with breakfast.     . fluticasone (FLONASE) 50 MCG/ACT nasal spray Place 1-2 sprays into both nostrils at bedtime.     . Multiple Vitamins-Minerals (MULTI FOR HER 50+) TABS Take 1 tablet by mouth daily.    . Multiple Vitamins-Minerals (PRESERVISION/LUTEIN) CAPS Take 1 capsule by mouth 2 (two) times daily.    . meloxicam (MOBIC) 15 MG tablet Take 15 mg by mouth daily. Reported on 04/14/2015    . Probiotic CAPS Take 1 capsule by mouth daily. Reported on 04/14/2015    . raloxifene (EVISTA) 60 MG tablet Take 60 mg by mouth daily with breakfast. Reported on 04/14/2015     No current facility-administered  medications on file prior to visit.    Review of Systems   Constitutional:   No  weight loss, night sweats,  Fevers, chills,  +fatigue, or  lassitude.  HEENT:   No headaches,  Difficulty swallowing,  Tooth/dental problems, or  Sore throat,                No sneezing, itching, ear ache, nasal congestion, post nasal drip,   CV:  No chest pain,  Orthopnea, PND,  , anasarca, dizziness, palpitations, syncope.   GI  No heartburn, indigestion, abdominal pain,  Resp: .  No chest wall deformity  Skin: no rash or lesions.  GU: no dysuria, change in color of urine, no urgency or frequency.  No flank pain, no hematuria   MS:  No joint pain or swelling.  No decreased range of motion.  No back  pain.  Psych:  No change in mood or affect. No depression or anxiety.  No memory loss.          Objective:   Physical Exam  GEN: A/Ox3; pleasant , NAD, frail and elderly   HEENT:  [place]/AT,  EACs-clear, TMs-wnl, NOSE-clear, THROAT-clear, no lesions, no postnasal drip or exudate noted.   NECK:  Supple w/ fair ROM; no JVD; normal carotid impulses w/o bruits; no thyromegaly or nodules palpated; no lymphadenopathy.  RESP  Decreased BS  no accessory muscle use, no dullness to percussion  CARD:  RRR, no m/r/g   tr+ peripheral edema, pulses intact, no cyanosis or clubbing.  GI:   Soft & nt; nml bowel sounds; no organomegaly or masses detected.  Musco: Warm bil, no deformities or joint swelling noted.   Neuro: alert, no focal deficits noted.    Skin: Warm, no lesions or rashes         Assessment & Plan:

## 2015-04-14 NOTE — Assessment & Plan Note (Signed)
HCAP with empyema in 07/2014 , empyema has resolved , serial CT chest showed improvement in PNA w/ lingering atx/? Scarring  Will repeat serial CT to follow this area for clearance .

## 2015-04-14 NOTE — Assessment & Plan Note (Signed)
Controlled on current regimen .  follow up in 3 months and As needed

## 2015-04-14 NOTE — Patient Instructions (Signed)
Continue on current regimen  Set up CT chest w/o contrast  Follow up with Dr. Halford Chessman  In 3-4 months and As needed

## 2015-04-27 ENCOUNTER — Telehealth: Payer: Self-pay | Admitting: Adult Health

## 2015-04-27 DIAGNOSIS — R590 Localized enlarged lymph nodes: Secondary | ICD-10-CM

## 2015-04-27 NOTE — Telephone Encounter (Signed)
That is fine to change to CT Chest with contrast Will need BMET .

## 2015-04-27 NOTE — Telephone Encounter (Signed)
LM for patient to return call.  Order placed for CT with Contrast and BMET

## 2015-04-27 NOTE — Telephone Encounter (Signed)
Spoke with Stacy at CT- requesting CT order placed on 04/14/15 be re-ordered.  States that the Impression from recent CXR recommended CT with Contrast to exclude central obstructing lesion.  Pt is scheduled for CT 04/28/15. Pt must have labs done today if order is placed as WITH contrast. Please advise TP. Thanks.

## 2015-04-28 ENCOUNTER — Other Ambulatory Visit: Payer: Self-pay | Admitting: Adult Health

## 2015-04-28 ENCOUNTER — Other Ambulatory Visit (INDEPENDENT_AMBULATORY_CARE_PROVIDER_SITE_OTHER): Payer: Commercial Managed Care - HMO

## 2015-04-28 ENCOUNTER — Ambulatory Visit (INDEPENDENT_AMBULATORY_CARE_PROVIDER_SITE_OTHER)
Admission: RE | Admit: 2015-04-28 | Discharge: 2015-04-28 | Disposition: A | Discharge status: Home / Self Care | Payer: Commercial Managed Care - HMO | Source: Ambulatory Visit | Attending: Adult Health | Admitting: Adult Health

## 2015-04-28 ENCOUNTER — Inpatient Hospital Stay: Admission: RE | Admit: 2015-04-28 | Payer: Self-pay | Source: Ambulatory Visit

## 2015-04-28 DIAGNOSIS — J189 Pneumonia, unspecified organism: Secondary | ICD-10-CM | POA: Diagnosis not present

## 2015-04-28 DIAGNOSIS — R590 Localized enlarged lymph nodes: Secondary | ICD-10-CM | POA: Diagnosis not present

## 2015-04-28 DIAGNOSIS — R911 Solitary pulmonary nodule: Secondary | ICD-10-CM

## 2015-04-28 LAB — BASIC METABOLIC PANEL
BUN: 20 mg/dL (ref 6–23)
CALCIUM: 9.9 mg/dL (ref 8.4–10.5)
CO2: 29 meq/L (ref 19–32)
Chloride: 105 mEq/L (ref 96–112)
Creatinine, Ser: 0.71 mg/dL (ref 0.40–1.20)
GFR: 85.5 mL/min (ref >60.00)
GLUCOSE: 78 mg/dL (ref 70–99)
Potassium: 4 mEq/L (ref 3.5–5.1)
Sodium: 143 mEq/L (ref 135–145)

## 2015-04-28 MED ORDER — IOHEXOL 300 MG/ML  SOLN
80.0000 mL | Freq: Once | INTRAMUSCULAR | Status: AC | PRN
  Administered 2015-04-28: 80 mL via INTRAVENOUS

## 2015-04-28 NOTE — Telephone Encounter (Signed)
lmtcb for pt.  

## 2015-04-28 NOTE — Progress Notes (Signed)
Quick Note:  Called and spoke with pt. Reviewed results and recs. Pt voiced understanding and had no further questions. CT order placed and put on reminder list. ______

## 2015-04-29 NOTE — Telephone Encounter (Signed)
Labs already done and also ct chest so no need to speak with the pt at this time

## 2015-06-16 DIAGNOSIS — H353131 Nonexudative age-related macular degeneration, bilateral, early dry stage: Secondary | ICD-10-CM | POA: Diagnosis not present

## 2015-07-02 DIAGNOSIS — J45909 Unspecified asthma, uncomplicated: Secondary | ICD-10-CM | POA: Diagnosis not present

## 2015-07-02 DIAGNOSIS — E7800 Pure hypercholesterolemia, unspecified: Secondary | ICD-10-CM | POA: Diagnosis not present

## 2015-07-02 DIAGNOSIS — J329 Chronic sinusitis, unspecified: Secondary | ICD-10-CM | POA: Diagnosis not present

## 2015-07-02 DIAGNOSIS — S0086XA Insect bite (nonvenomous) of other part of head, initial encounter: Secondary | ICD-10-CM | POA: Diagnosis not present

## 2015-07-02 DIAGNOSIS — J309 Allergic rhinitis, unspecified: Secondary | ICD-10-CM | POA: Diagnosis not present

## 2015-07-02 DIAGNOSIS — M542 Cervicalgia: Secondary | ICD-10-CM | POA: Diagnosis not present

## 2015-07-02 DIAGNOSIS — J45901 Unspecified asthma with (acute) exacerbation: Secondary | ICD-10-CM | POA: Diagnosis not present

## 2015-07-02 DIAGNOSIS — T148 Other injury of unspecified body region: Secondary | ICD-10-CM | POA: Diagnosis not present

## 2015-07-24 ENCOUNTER — Ambulatory Visit: Payer: Self-pay | Admitting: Pulmonary Disease

## 2015-08-03 DIAGNOSIS — D72829 Elevated white blood cell count, unspecified: Secondary | ICD-10-CM | POA: Diagnosis not present

## 2015-12-03 DIAGNOSIS — R350 Frequency of micturition: Secondary | ICD-10-CM | POA: Diagnosis not present

## 2015-12-03 DIAGNOSIS — N3090 Cystitis, unspecified without hematuria: Secondary | ICD-10-CM | POA: Diagnosis not present

## 2015-12-21 DIAGNOSIS — Z23 Encounter for immunization: Secondary | ICD-10-CM | POA: Diagnosis not present

## 2015-12-21 DIAGNOSIS — H109 Unspecified conjunctivitis: Secondary | ICD-10-CM | POA: Diagnosis not present

## 2015-12-23 DIAGNOSIS — H353132 Nonexudative age-related macular degeneration, bilateral, intermediate dry stage: Secondary | ICD-10-CM | POA: Diagnosis not present

## 2015-12-23 DIAGNOSIS — H1013 Acute atopic conjunctivitis, bilateral: Secondary | ICD-10-CM | POA: Diagnosis not present

## 2016-01-06 DIAGNOSIS — L309 Dermatitis, unspecified: Secondary | ICD-10-CM | POA: Diagnosis not present

## 2016-01-06 DIAGNOSIS — H1010 Acute atopic conjunctivitis, unspecified eye: Secondary | ICD-10-CM | POA: Diagnosis not present

## 2016-01-25 DIAGNOSIS — H01003 Unspecified blepharitis right eye, unspecified eyelid: Secondary | ICD-10-CM | POA: Diagnosis not present

## 2016-02-08 DIAGNOSIS — L718 Other rosacea: Secondary | ICD-10-CM | POA: Diagnosis not present

## 2016-02-15 DIAGNOSIS — B373 Candidiasis of vulva and vagina: Secondary | ICD-10-CM | POA: Diagnosis not present

## 2016-02-15 DIAGNOSIS — R300 Dysuria: Secondary | ICD-10-CM | POA: Diagnosis not present

## 2016-02-22 ENCOUNTER — Other Ambulatory Visit: Payer: Self-pay | Admitting: Family Medicine

## 2016-02-22 ENCOUNTER — Ambulatory Visit
Admission: RE | Admit: 2016-02-22 | Discharge: 2016-02-22 | Disposition: A | Discharge status: Home / Self Care | Payer: Commercial Managed Care - HMO | Source: Ambulatory Visit | Attending: Family Medicine | Admitting: Family Medicine

## 2016-02-22 DIAGNOSIS — R05 Cough: Secondary | ICD-10-CM

## 2016-02-22 DIAGNOSIS — J690 Pneumonitis due to inhalation of food and vomit: Secondary | ICD-10-CM

## 2016-02-22 DIAGNOSIS — R059 Cough, unspecified: Secondary | ICD-10-CM

## 2016-02-22 DIAGNOSIS — K625 Hemorrhage of anus and rectum: Secondary | ICD-10-CM | POA: Diagnosis not present

## 2016-02-29 DIAGNOSIS — K625 Hemorrhage of anus and rectum: Secondary | ICD-10-CM | POA: Diagnosis not present

## 2016-02-29 DIAGNOSIS — D72829 Elevated white blood cell count, unspecified: Secondary | ICD-10-CM | POA: Diagnosis not present

## 2016-02-29 DIAGNOSIS — E7800 Pure hypercholesterolemia, unspecified: Secondary | ICD-10-CM | POA: Diagnosis not present

## 2016-02-29 DIAGNOSIS — Z0000 Encounter for general adult medical examination without abnormal findings: Secondary | ICD-10-CM | POA: Diagnosis not present

## 2016-02-29 DIAGNOSIS — J309 Allergic rhinitis, unspecified: Secondary | ICD-10-CM | POA: Diagnosis not present

## 2016-02-29 DIAGNOSIS — M899 Disorder of bone, unspecified: Secondary | ICD-10-CM | POA: Diagnosis not present

## 2016-02-29 DIAGNOSIS — M1990 Unspecified osteoarthritis, unspecified site: Secondary | ICD-10-CM | POA: Diagnosis not present

## 2016-02-29 DIAGNOSIS — J45909 Unspecified asthma, uncomplicated: Secondary | ICD-10-CM | POA: Diagnosis not present

## 2016-02-29 DIAGNOSIS — Z9013 Acquired absence of bilateral breasts and nipples: Secondary | ICD-10-CM | POA: Diagnosis not present

## 2016-03-08 DIAGNOSIS — R197 Diarrhea, unspecified: Secondary | ICD-10-CM | POA: Diagnosis not present

## 2016-03-17 ENCOUNTER — Ambulatory Visit
Admission: RE | Admit: 2016-03-17 | Discharge: 2016-03-17 | Disposition: A | Discharge status: Home / Self Care | Payer: Commercial Managed Care - HMO | Source: Ambulatory Visit | Attending: Family Medicine | Admitting: Family Medicine

## 2016-03-17 ENCOUNTER — Other Ambulatory Visit: Payer: Self-pay | Admitting: Family Medicine

## 2016-03-17 DIAGNOSIS — R05 Cough: Secondary | ICD-10-CM

## 2016-03-17 DIAGNOSIS — R0602 Shortness of breath: Secondary | ICD-10-CM | POA: Diagnosis not present

## 2016-03-17 DIAGNOSIS — R059 Cough, unspecified: Secondary | ICD-10-CM

## 2016-03-17 DIAGNOSIS — J069 Acute upper respiratory infection, unspecified: Secondary | ICD-10-CM | POA: Diagnosis not present

## 2016-03-24 ENCOUNTER — Other Ambulatory Visit: Payer: Self-pay | Admitting: Adult Health

## 2016-03-25 DIAGNOSIS — J45901 Unspecified asthma with (acute) exacerbation: Secondary | ICD-10-CM | POA: Diagnosis not present

## 2016-04-11 DIAGNOSIS — M8588 Other specified disorders of bone density and structure, other site: Secondary | ICD-10-CM | POA: Diagnosis not present

## 2016-04-11 LAB — HM DEXA SCAN

## 2016-04-11 IMAGING — DX DG CHEST 1V PORT
1 series · 1 of 1 positions shown · non-contrast
Comparison: Chest x-ray 08/05/2014, 08/02/2014, 10/17/2012.
Abdominal CT 08/02/2014

CLINICAL DATA: 73-year-old female with a history of shortness of
breath

EXAM:
PORTABLE CHEST - 1 VIEW

[chest ap]
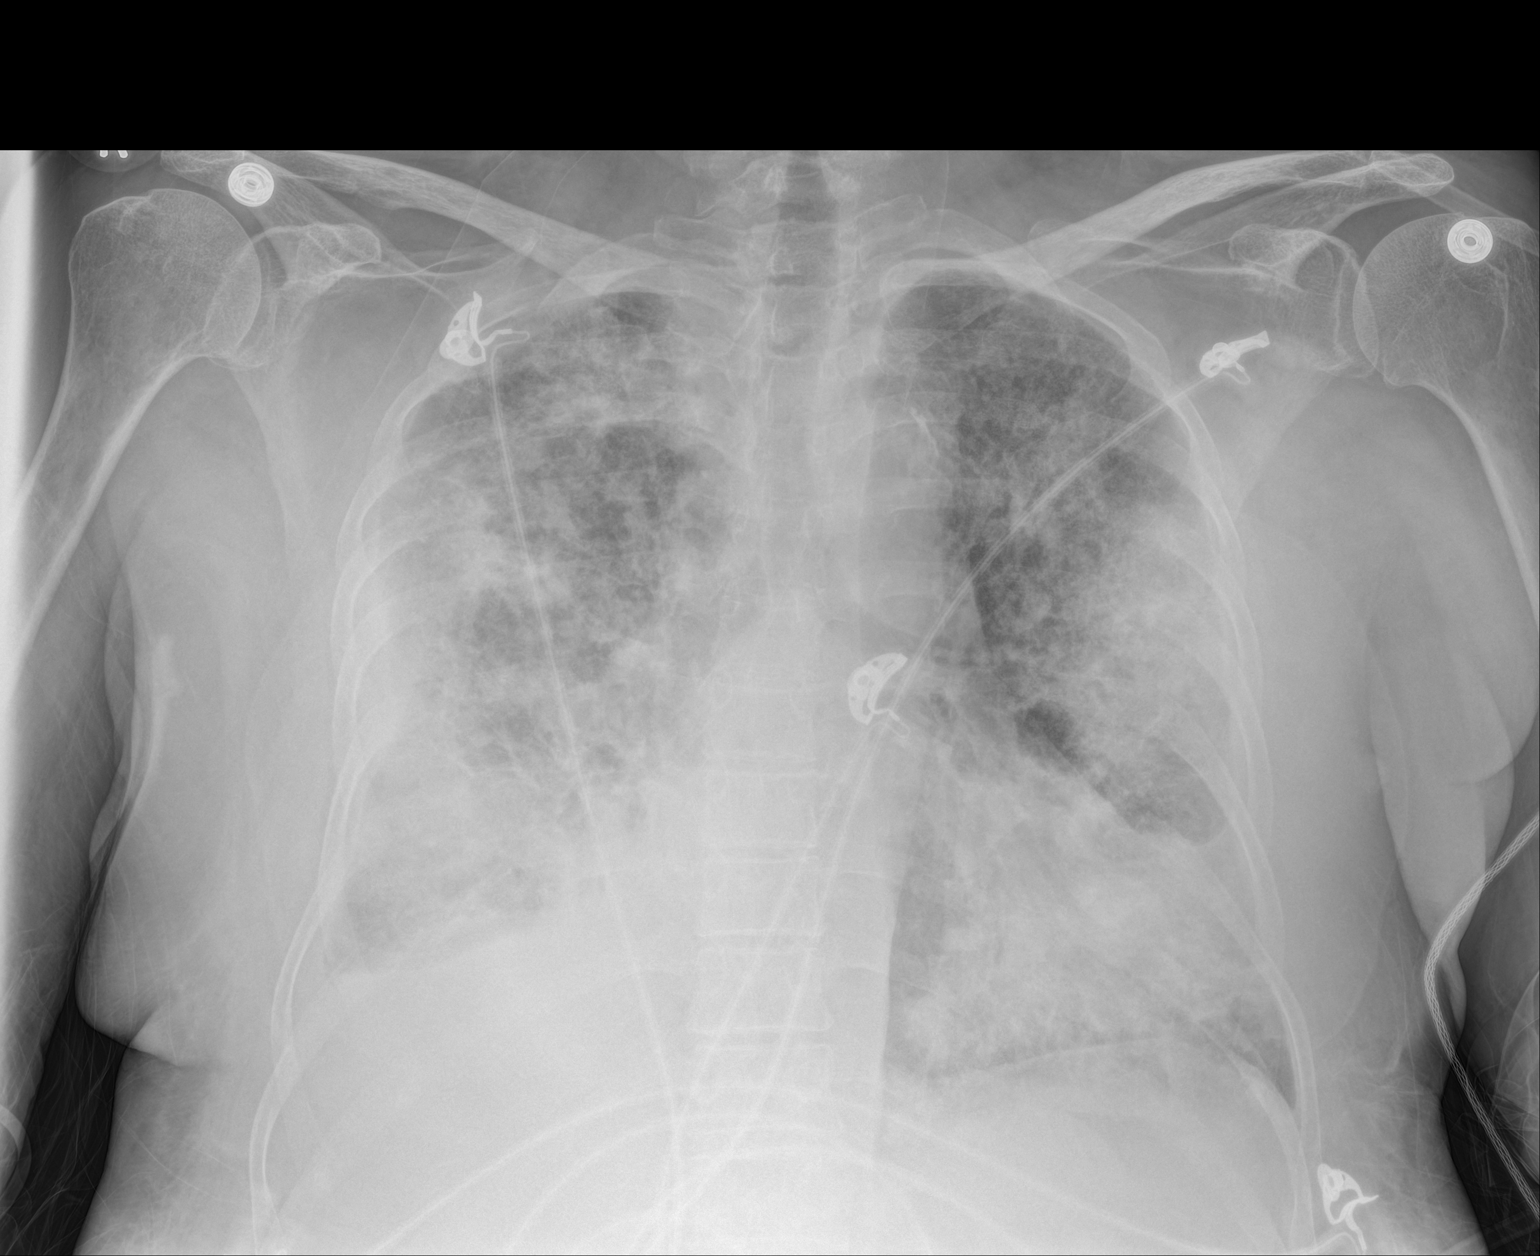

[1 of 1 positions shown; findings below may reference images not displayed]

FINDINGS: Cardiomediastinal silhouette likely unchanged, partially obscured by
overlying lung/ pleural disease.

Atherosclerotic calcifications.

Persisting bilateral, right greater than left airspace disease. No
pneumothorax.

Likely right-sided pleural effusion.

Surgical changes of breast prosthesis.

No displaced fracture.
IMPRESSION: Persisting bilateral airspace disease, worst on the right.

Right-sided pleural effusion.

Atherosclerosis.

## 2016-04-13 IMAGING — DX DG CHEST 1V PORT
1 series · 1 of 1 positions shown · non-contrast
Comparison: 08/06/2014.

CLINICAL DATA: Pneumonia.

EXAM:
PORTABLE CHEST - 1 VIEW

[chest ap]
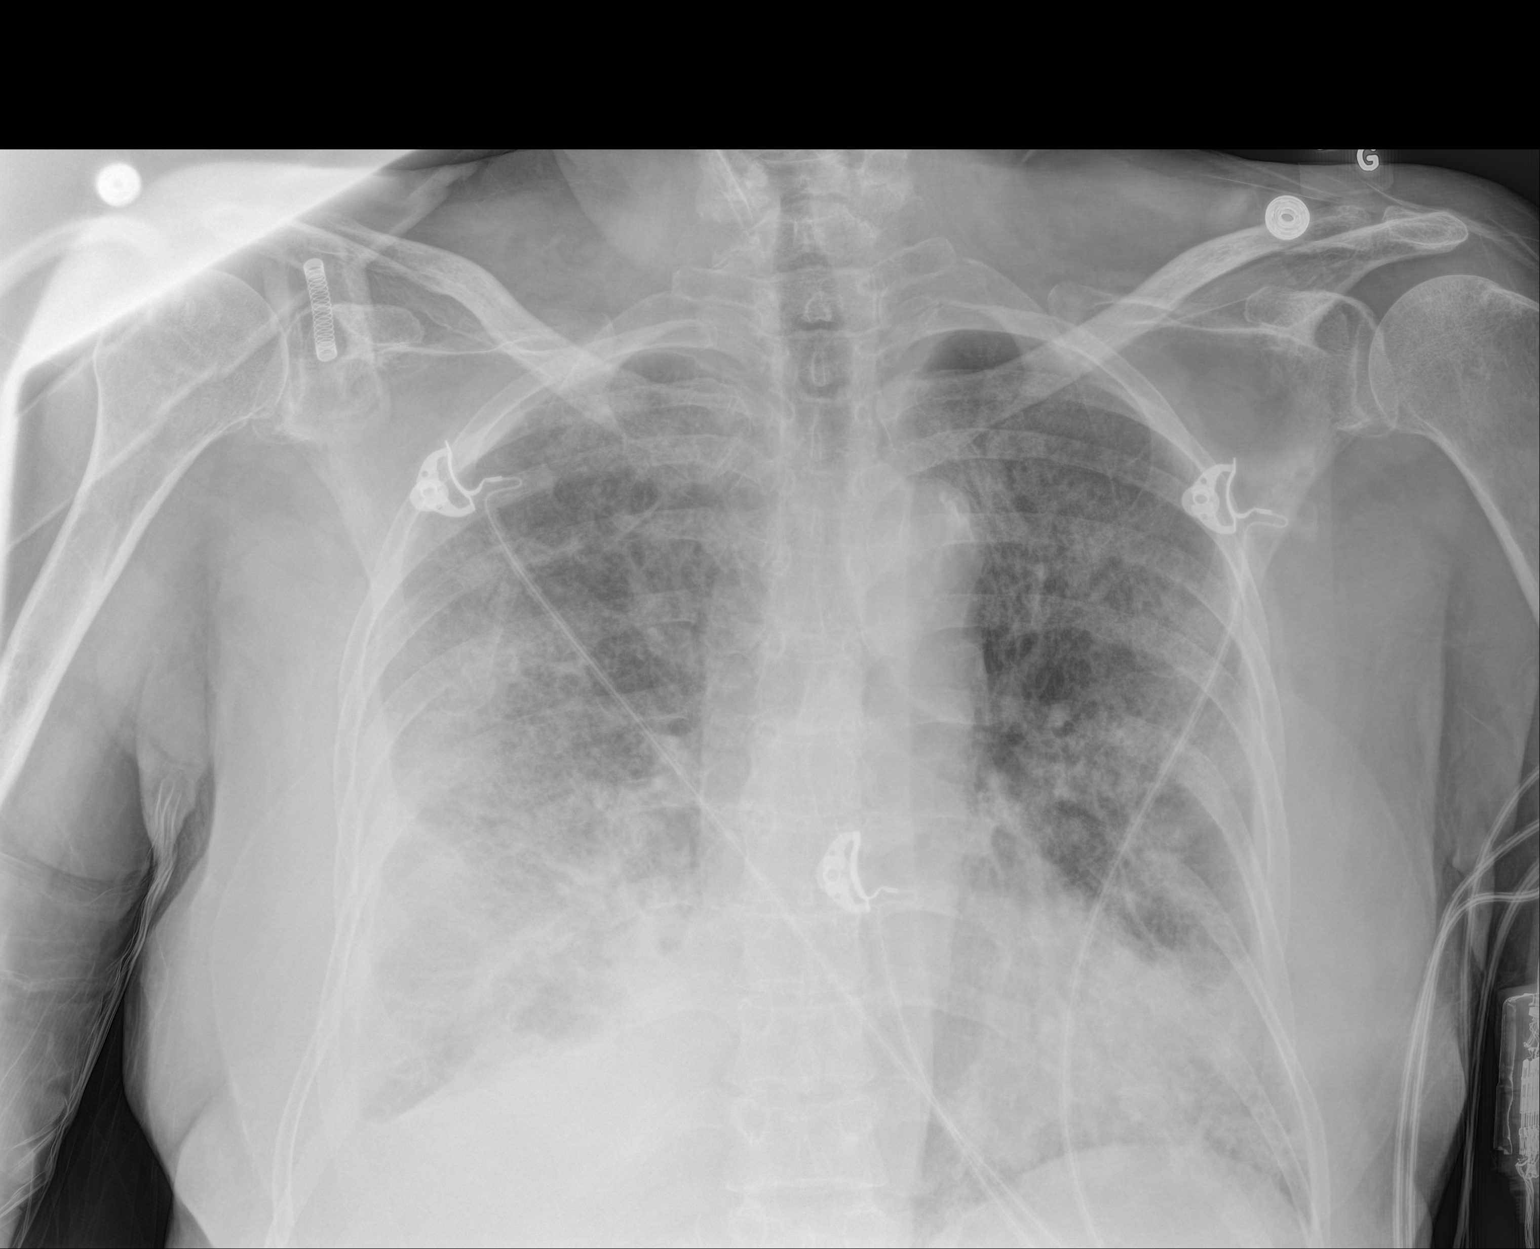

[1 of 1 positions shown; findings below may reference images not displayed]

FINDINGS: Mediastinum and hilar structures normal. Heart size stable.
Pulmonary vascularity normal . Diffuse bilateral airspace disease
noted. Stable right pleural effusion. No pneumothorax. No acute bony
abnormality.
IMPRESSION: 1. Diffuse bilateral airspace disease, no interim change .
2. Persistent small right pleural effusion.

## 2016-04-27 ENCOUNTER — Ambulatory Visit (INDEPENDENT_AMBULATORY_CARE_PROVIDER_SITE_OTHER)
Admission: RE | Admit: 2016-04-27 | Discharge: 2016-04-27 | Disposition: A | Discharge status: Home / Self Care | Payer: Medicare HMO | Source: Ambulatory Visit | Attending: Adult Health | Admitting: Adult Health

## 2016-04-27 DIAGNOSIS — R911 Solitary pulmonary nodule: Secondary | ICD-10-CM

## 2016-05-03 IMAGING — US US THORACENTESIS ASP PLEURAL SPACE W/IMG GUIDE
1 series · 7 of 7 positions shown · non-contrast
Comparison: None.

CLINICAL DATA: History of breast cancer, pneumonia, recurrent right
pleural effusion. Request is made for diagnostic and therapeutic
right thoracentesis.

EXAM:
ULTRASOUND GUIDED DIAGNOSTIC AND THERAPEUTIC RIGHT THORACENTESIS

[Series 1: us thoracentesis asp pleural space w/img guide · 0.18mm/px · 7 of 7 slices shown]
[im 1/7]
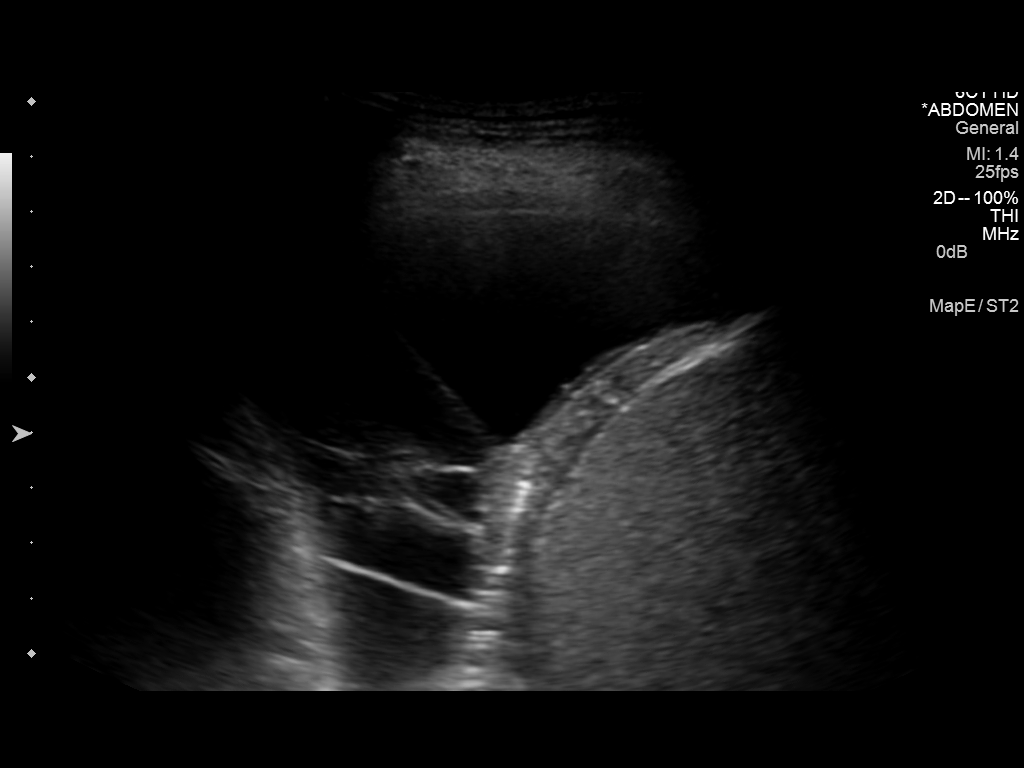
[im 2/7]
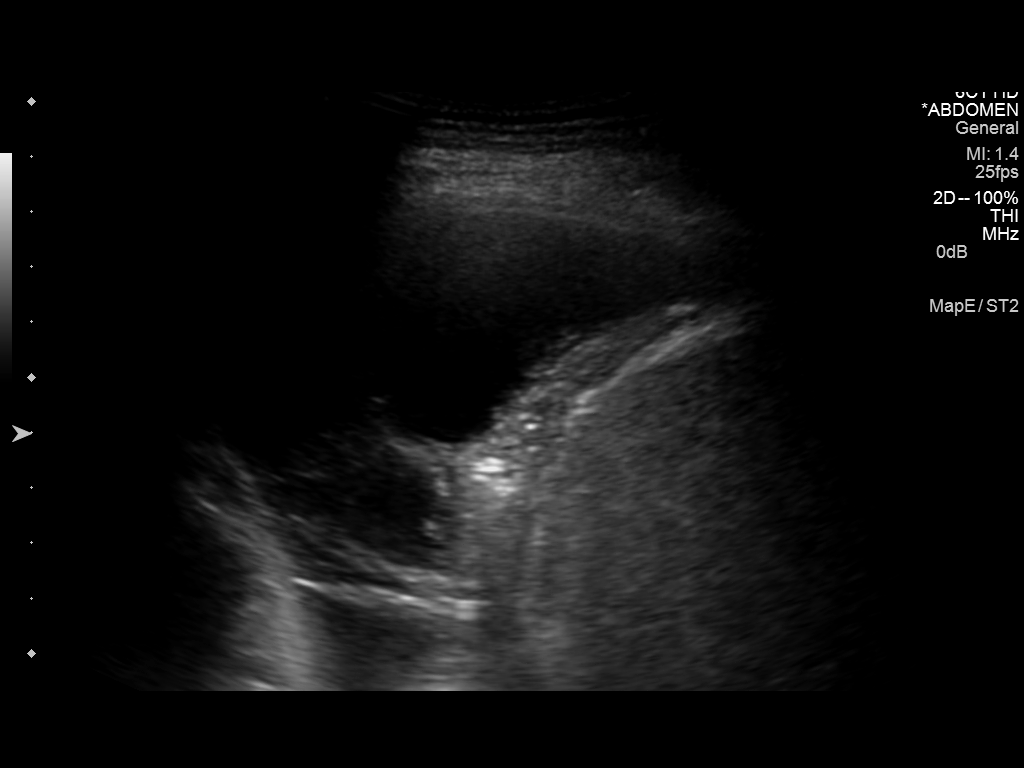
[im 3/7]
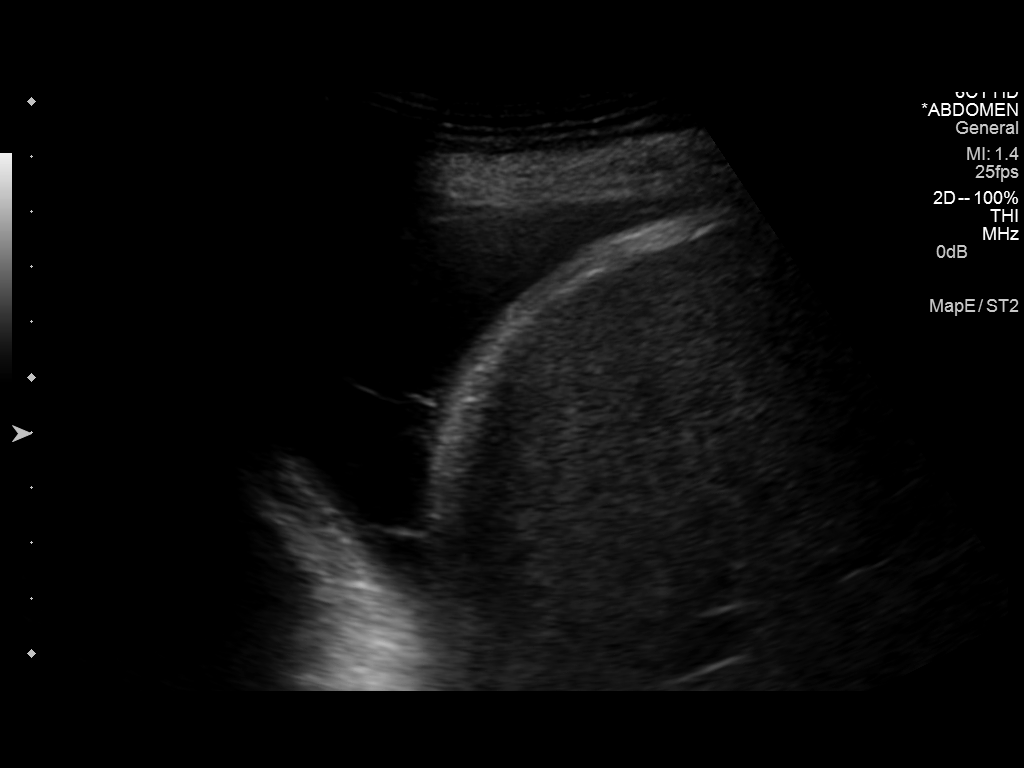
[im 4/7]
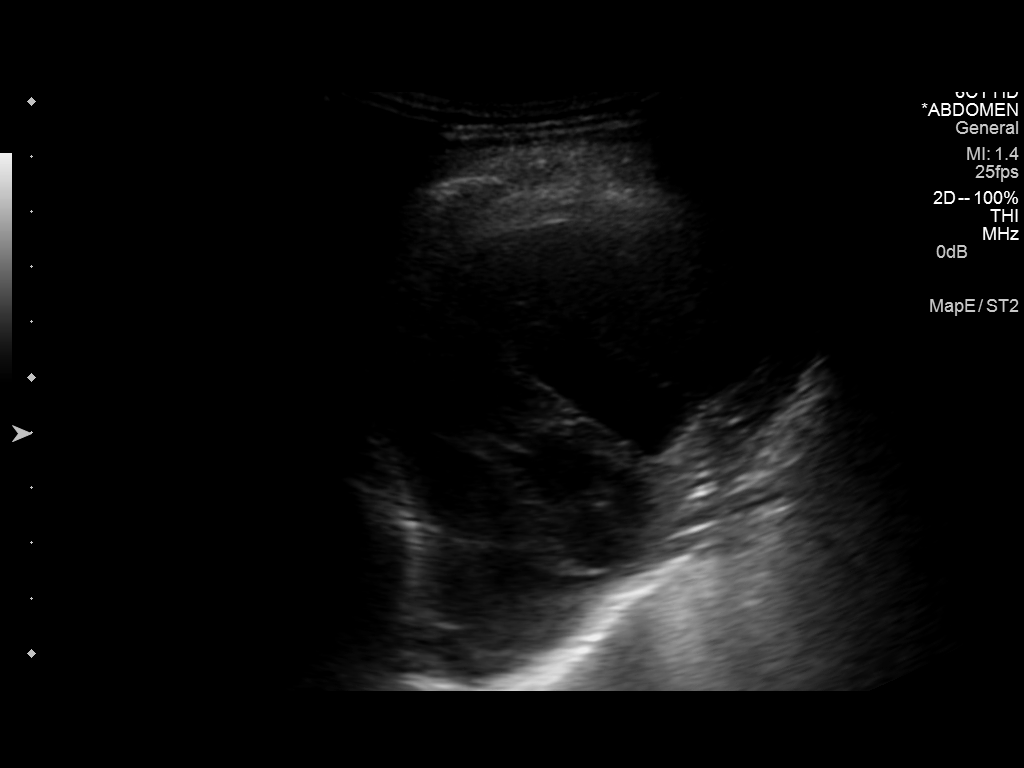
[im 5/7]
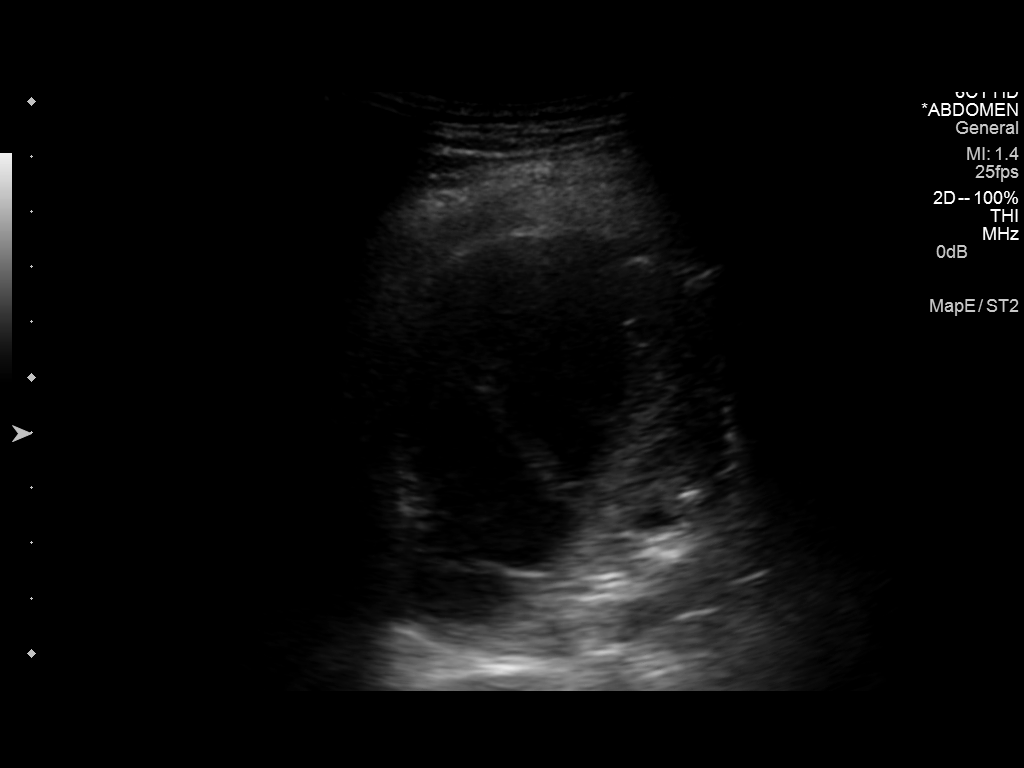
[im 6/7]
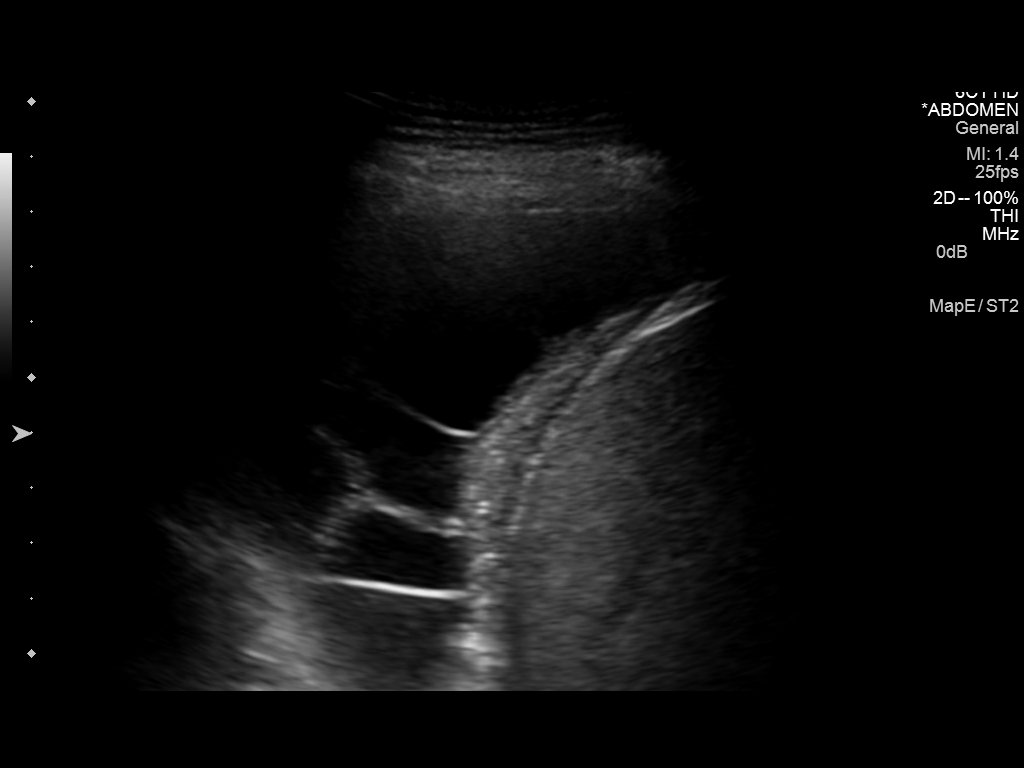
[im 7/7]
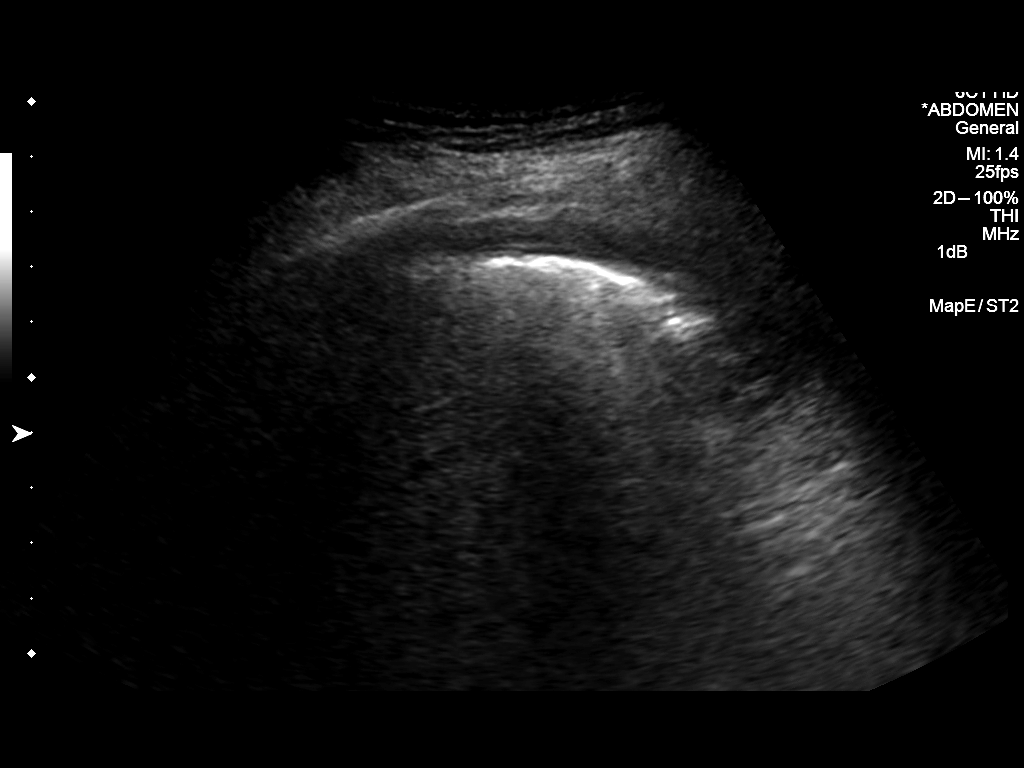

[7 of 7 positions shown; findings below may reference images not displayed]

PROCEDURE:
An ultrasound guided thoracentesis was thoroughly discussed with the
patient and questions answered. The benefits, risks, alternatives
and complications were also discussed. The patient understands and
wishes to proceed with the procedure. Written consent was obtained.

Ultrasound was performed to localize and mark an adequate pocket of
fluid in the right chest. The area was then prepped and draped in
the normal sterile fashion. 1% Lidocaine was used for local
anesthesia. Under ultrasound guidance a 19 gauge Yueh catheter was
introduced. Thoracentesis was performed. The catheter was removed
and a dressing applied.

COMPLICATIONS:
None.
FINDINGS: A total of approximately 350 cc's of turbid, yellow fluid was
removed. The fluid sample was sent for laboratory analysis.
IMPRESSION: Successful ultrasound guided diagnostic and therapeutic right
thoracentesis yielding 350 cc's of pleural fluid.

## 2016-05-11 IMAGING — CR DG CHEST 2V
2 series · 2 of 2 positions shown · non-contrast
Comparison: 08/28/2014

CLINICAL DATA: Healthcare assisted pneumonia

EXAM:
CHEST  2 VIEW

[view not recorded (1 of 2)]
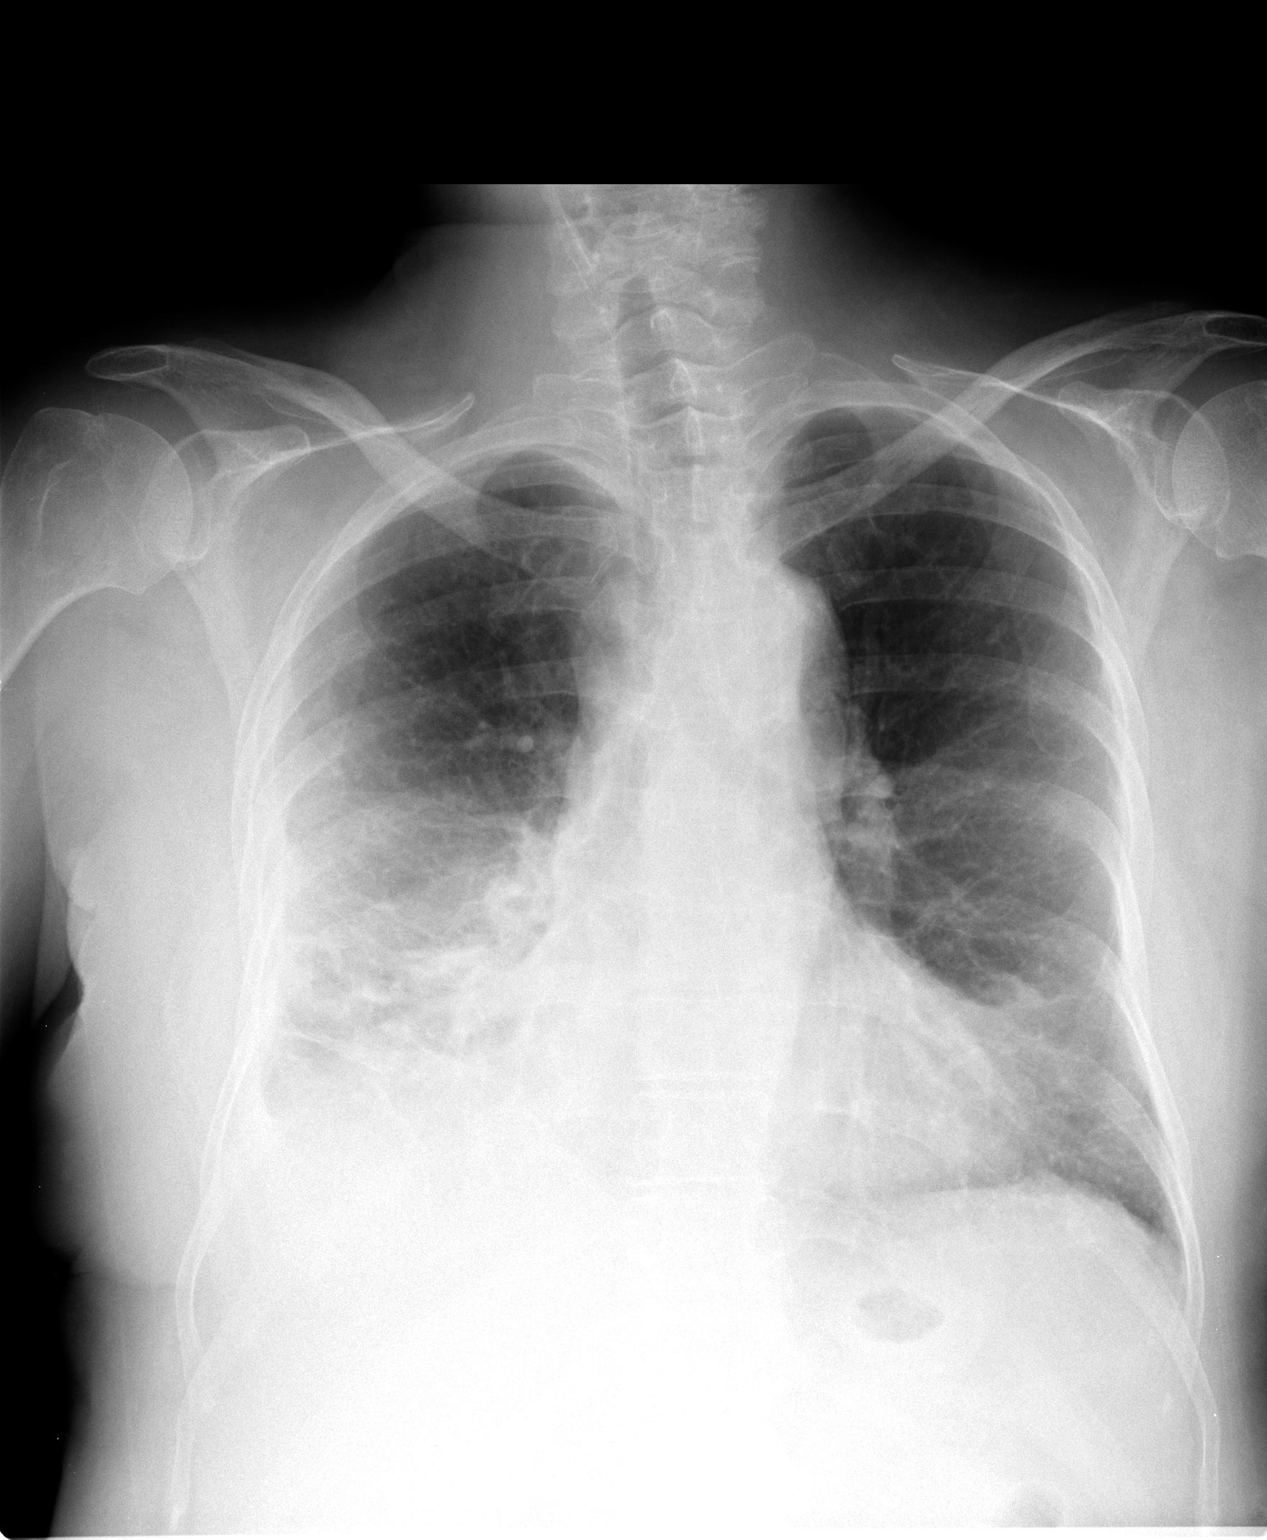

[view not recorded (2 of 2)]
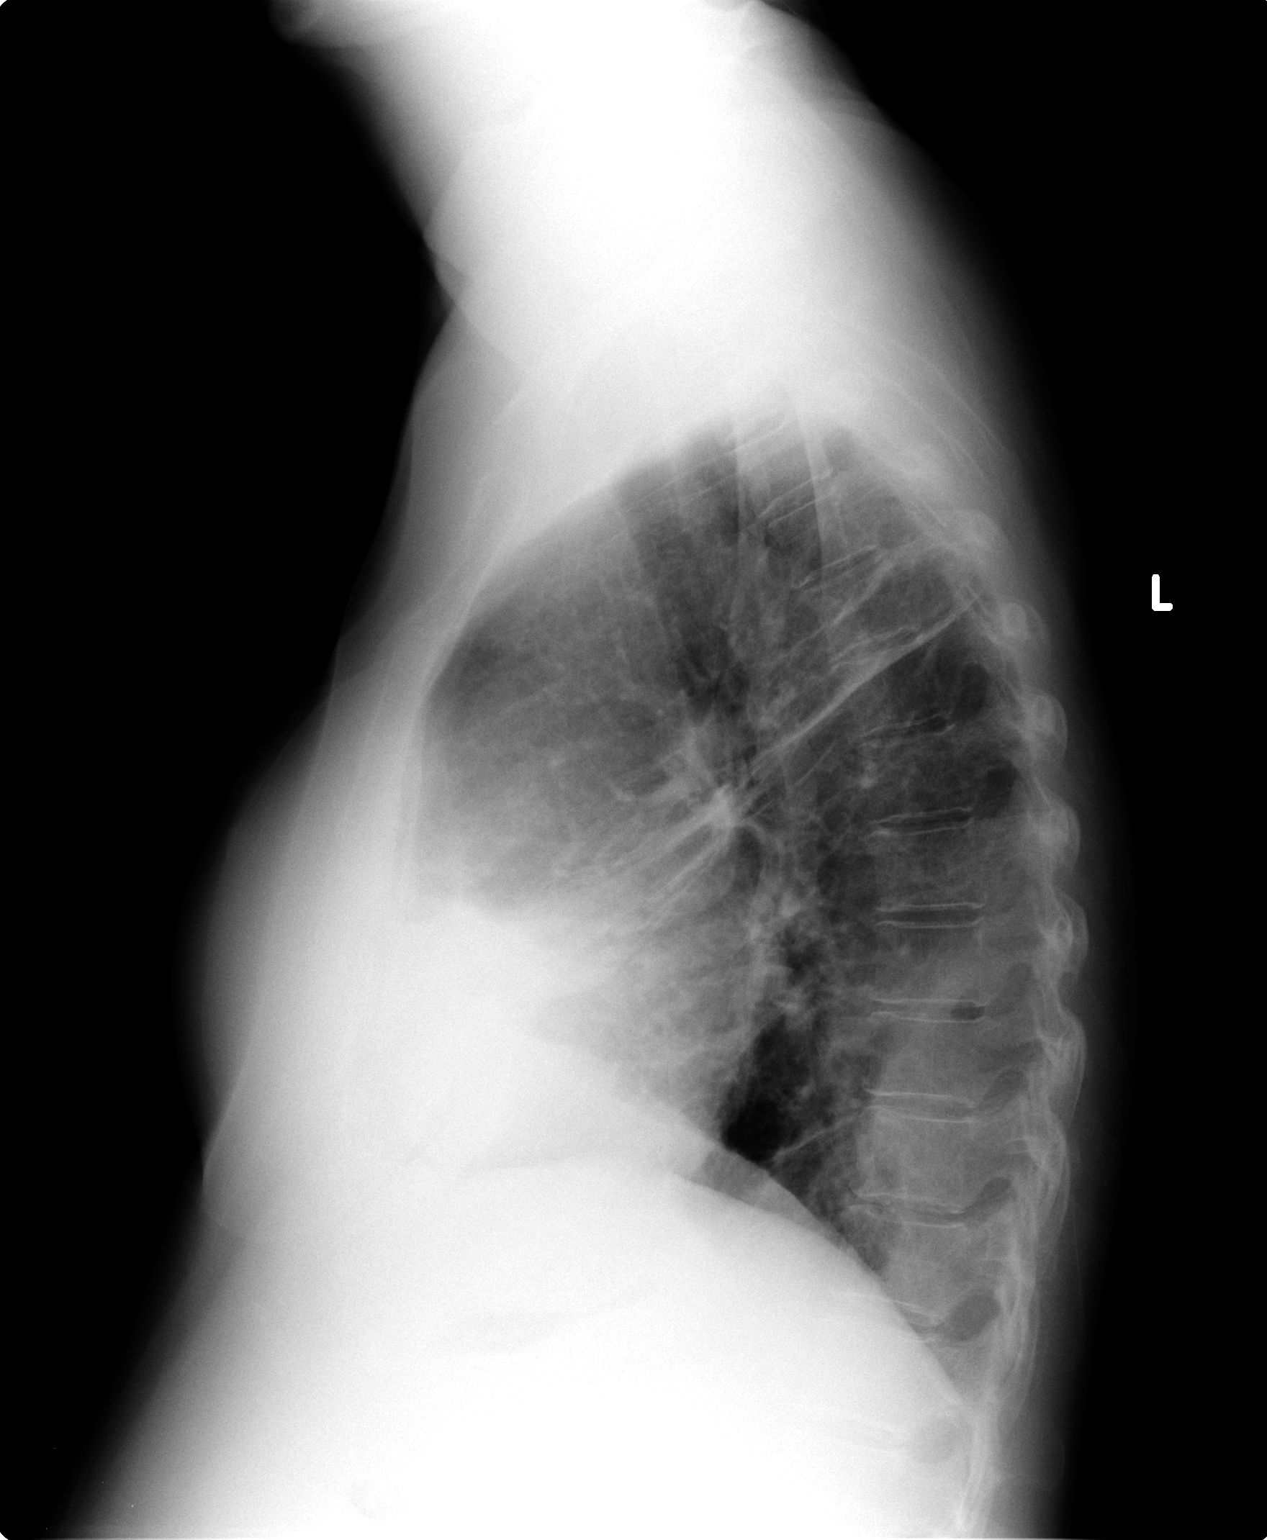

[2 of 2 positions shown; findings below may reference images not displayed]

FINDINGS: Right lower lobe airspace disease and posterior loculated pleural
effusion unchanged from 08/27/2014. Right middle lobe airspace
disease unchanged.

Mild patchy left lower lobe airspace disease unchanged.

Negative for heart failure.
IMPRESSION: No significant change right lower lobe and right middle lobe
infiltrate and loculated pleural effusion posteriorly.

Mild left lower lobe airspace disease also unchanged.

## 2016-05-24 DIAGNOSIS — J309 Allergic rhinitis, unspecified: Secondary | ICD-10-CM | POA: Diagnosis not present

## 2016-05-24 DIAGNOSIS — J45909 Unspecified asthma, uncomplicated: Secondary | ICD-10-CM | POA: Diagnosis not present

## 2016-05-24 DIAGNOSIS — R05 Cough: Secondary | ICD-10-CM | POA: Diagnosis not present

## 2016-05-31 DIAGNOSIS — E782 Mixed hyperlipidemia: Secondary | ICD-10-CM | POA: Diagnosis not present

## 2016-06-27 DIAGNOSIS — H353132 Nonexudative age-related macular degeneration, bilateral, intermediate dry stage: Secondary | ICD-10-CM | POA: Diagnosis not present

## 2016-07-18 ENCOUNTER — Encounter: Payer: Self-pay | Admitting: Primary Care

## 2016-07-18 ENCOUNTER — Encounter (INDEPENDENT_AMBULATORY_CARE_PROVIDER_SITE_OTHER): Payer: Self-pay

## 2016-07-18 ENCOUNTER — Ambulatory Visit (INDEPENDENT_AMBULATORY_CARE_PROVIDER_SITE_OTHER): Payer: Medicare HMO | Admitting: Primary Care

## 2016-07-18 VITALS — BP 120/76 | HR 92 | Temp 97.7°F | Ht 59.5 in | Wt 135.8 lb

## 2016-07-18 DIAGNOSIS — J309 Allergic rhinitis, unspecified: Secondary | ICD-10-CM | POA: Diagnosis not present

## 2016-07-18 DIAGNOSIS — E785 Hyperlipidemia, unspecified: Secondary | ICD-10-CM

## 2016-07-18 DIAGNOSIS — M8580 Other specified disorders of bone density and structure, unspecified site: Secondary | ICD-10-CM

## 2016-07-18 DIAGNOSIS — J4540 Moderate persistent asthma, uncomplicated: Secondary | ICD-10-CM | POA: Diagnosis not present

## 2016-07-18 MED ORDER — BUDESONIDE-FORMOTEROL FUMARATE 160-4.5 MCG/ACT IN AERO: 2.0000 | INHALATION_SPRAY | Freq: Two times a day (BID) | RESPIRATORY_TRACT | 3 refills | Status: DC

## 2016-07-18 NOTE — Assessment & Plan Note (Signed)
Discussed potential side effects of stains and recommended she start Pravastatin as prescribed. Will check lipids and LFT's in 2 months.

## 2016-07-18 NOTE — Patient Instructions (Signed)
We've increased the dose of your Symbicort from 80 mcg to 160 mcg. You may inhale four puffs of your Symbicort 80 mcg twice daily until this is empty. Remember to inhale only two puffs twice daily with the Symbicort 160 mcg.   Start low dose guaifenesin once daily with a full glass of water once daily for mucous production.  Please notify me if no improvement in coughing spells and mucous production.  Schedule a lab only appointment two months after you start the Pravastatin to recheck your cholesterol.  It was a pleasure to meet you today! Please don't hesitate to call me with any questions. Welcome to [facility]!

## 2016-07-18 NOTE — Assessment & Plan Note (Signed)
Deteriorated given coughing spells and frequent use of SABA. Will increase Symbicort to 160 mcg dose. Start Mucinex daily. Continue other regimen. She will update in 2 weeks if no improvement in coughing spells/wheezing. Stable exam today.

## 2016-07-18 NOTE — Assessment & Plan Note (Signed)
Overall stable on current regimen, doesn't feel as though this is a problem. Continue Singulair.

## 2016-07-18 NOTE — Assessment & Plan Note (Signed)
Will obtain records for recent bone density scan, continue Evista for now.

## 2016-07-18 NOTE — Progress Notes (Signed)
Subjective:    Patient ID: [name], female    DOB: [birth year], 76 y.o.   MRN: [id number]  HPI  [name] is a 76 year old female who presents today to establish care and discuss the problems mentioned below. Will obtain old records.  1) Asthma: Currently managed on Symbicort 80/4.5 mcg, Dymista, albuterol inhaler, Allegra PRN. Previously following with pulmonology, has not seen them in over 1 year. She underwent CT chest in January 2018 with stable, benign, 4 mm left upper lobe pulmonary nodule and mild chronic scarring.   She continues to experience daily mucous production in the morning and coughing spells 2-3 times daily despite her regimen. She uses her Ventolin PRN with improvement for which she uses 6 times weekly. The Dymista is too expensive.   2) Hyperlipidemia: Diagnosed in late December 2017 and was prescribed pravastatin 20 mg. She never took the pravastatin as she read "bad things" about statins online. Strong family history of heart disease in her father. Her last lipid panel was above goal in March 2018. She thinks her TC was 220, cannot remember her LDL result but knows it was above goal. She denies chest pain, dizziness, shortness of breath.    3) Osteopenia: Currently managed on Evista for bone health. Cannot take Fosamax. Her last bone density scan was recent and thinks it was stable.   Review of Systems  HENT: Positive for congestion.   Eyes: Negative for visual disturbance.  Respiratory: Positive for cough, shortness of breath and wheezing.   Cardiovascular: Negative for chest pain.  Allergic/Immunologic: Positive for environmental allergies.  Neurological: Negative for dizziness.       Past Medical History:  Diagnosis Date  . Arthritis   . Asthma   . Cancer of breast ([place])    33 years ago, left,   . Hearing loss    both ears, wwears hearing aides  . Hyperlipidemia   . Macular degeneration   . Osteopenia      Social History   Social History  .  Marital status: Married    Spouse name: N/A  . Number of children: N/A  . Years of education: N/A   Occupational History  . retired    Social History Main Topics  . Smoking status: Never Smoker  . Smokeless tobacco: Never Used  . Alcohol use Yes     Comment: glass wine 1x month  . Drug use: No  . Sexual activity: Not on file   Other Topics Concern  . Not on file   Social History Narrative   Married.   2 children, 6 grandchildren.   Retired. Previously was Pharmacist, hospital.    Enjoys participating in church.     Past Surgical History:  Procedure Laterality Date  . ABDOMINAL HYSTERECTOMY    . BREAST ENHANCEMENT SURGERY    . CESAREAN SECTION     x 2  . COLONOSCOPY WITH PROPOFOL N/A 04/21/2014   Procedure: COLONOSCOPY WITH PROPOFOL;  Surgeon: Garlan Fair, MD;  Location: [facility];  Service: Endoscopy;  Laterality: N/A;  . EYE SURGERY Bilateral march 2015 and june 2015   lens for cataracts  . LEFT HEART CATHETERIZATION WITH CORONARY ANGIOGRAM N/A 01/24/2012   Procedure: LEFT HEART CATHETERIZATION WITH CORONARY ANGIOGRAM;  Surgeon: Sueanne Margarita, MD;  Location: [facility];  Service: Cardiovascular;  Laterality: N/A;  . MASTECTOMY  33 yrs ago   bilateral    Family History  Problem Relation Age of Onset  .  Macular degeneration Mother   . Heart attack Father     Allergies  Allergen Reactions  . Fosamax [Alendronate Sodium] Nausea Only  . Sulfonamide Derivatives Rash    Current Outpatient Prescriptions on File Prior to Visit  Medication Sig Dispense Refill  . calcium carbonate (TUMS) 500 MG chewable tablet Chew 1,000 mg by mouth at bedtime.     . Cholecalciferol (VITAMIN D) 1000 UNITS capsule Take 1,000 Units by mouth daily with breakfast.     . fexofenadine (ALLEGRA) 180 MG tablet Take 180 mg by mouth daily with breakfast.     . meloxicam (MOBIC) 15 MG tablet Take 15 mg by mouth daily. Reported on 04/14/2015    . Multiple Vitamins-Minerals (MULTI FOR HER 50+) TABS  Take 1 tablet by mouth daily.    . Multiple Vitamins-Minerals (PRESERVISION/LUTEIN) CAPS Take 1 capsule by mouth 2 (two) times daily.    . Probiotic CAPS Take 1 capsule by mouth daily. Reported on 04/14/2015    . raloxifene (EVISTA) 60 MG tablet Take 60 mg by mouth daily with breakfast. Reported on 04/14/2015     No current facility-administered medications on file prior to visit.     BP 120/76   Pulse 92   Temp 97.7 F (36.5 C) (Oral)   Ht 4' 11.5" (1.511 m)   Wt 135 lb 12.8 oz (61.6 kg)   SpO2 96%   BMI 26.97 kg/m    Objective:   Physical Exam  Constitutional: She appears well-nourished.  Neck: Neck supple.  Cardiovascular: Normal rate and regular rhythm.   Pulmonary/Chest: Effort normal. She has wheezes in the right upper field and the left upper field.  Skin: Skin is warm and dry.  Psychiatric: She has a normal mood and affect.          Assessment & Plan:

## 2016-07-18 NOTE — Progress Notes (Signed)
Pre visit review using our clinic review tool, if applicable. No additional management support is needed unless otherwise documented below in the visit note. 

## 2016-08-19 ENCOUNTER — Encounter: Payer: Self-pay | Admitting: Primary Care

## 2016-08-19 ENCOUNTER — Telehealth: Payer: Self-pay | Admitting: Primary Care

## 2016-08-19 NOTE — Telephone Encounter (Signed)
Please notify patient that I reviewed her records and she'll be due for her Brice in December 2018. Please schedule her with Katha Cabal and myself. Also remind her of her lipid panel that's due late next month. Lab only. Please schedule.

## 2016-08-19 NOTE — Telephone Encounter (Signed)
Message left for patient to return my call.  

## 2016-08-24 NOTE — Telephone Encounter (Signed)
Patient will call back later regarding appointments in December.

## 2016-08-24 NOTE — Telephone Encounter (Signed)
Spoken to patient and lab appt on 09/21/2016.

## 2016-09-21 ENCOUNTER — Other Ambulatory Visit (INDEPENDENT_AMBULATORY_CARE_PROVIDER_SITE_OTHER): Payer: Medicare HMO

## 2016-09-21 DIAGNOSIS — E785 Hyperlipidemia, unspecified: Secondary | ICD-10-CM | POA: Diagnosis not present

## 2016-09-21 LAB — HEPATIC FUNCTION PANEL
ALBUMIN: 3.6 g/dL (ref 3.5–5.2)
ALK PHOS: 65 U/L (ref 39–117)
ALT: 48 U/L — ABNORMAL HIGH (ref 0–35)
AST: 53 U/L — ABNORMAL HIGH (ref 0–37)
BILIRUBIN TOTAL: 0.2 mg/dL (ref 0.2–1.2)
Bilirubin, Direct: 0 mg/dL (ref 0.0–0.3)
Total Protein: 5.9 g/dL — ABNORMAL LOW (ref 6.0–8.3)

## 2016-09-21 LAB — LIPID PANEL
CHOL/HDL RATIO: 4
Cholesterol: 143 mg/dL (ref 0–200)
HDL: 37.2 mg/dL — AB (ref >39.00)
LDL Cholesterol: 69 mg/dL (ref 0–99)
NonHDL: 105.45
Triglycerides: 184 mg/dL — ABNORMAL HIGH (ref 0.0–149.0)
VLDL: 36.8 mg/dL (ref 0.0–40.0)

## 2016-09-22 ENCOUNTER — Encounter: Payer: Self-pay | Admitting: *Deleted

## 2016-09-26 ENCOUNTER — Other Ambulatory Visit: Payer: Self-pay | Admitting: *Deleted

## 2016-09-26 MED ORDER — MONTELUKAST SODIUM 10 MG PO TABS: 10.0000 mg | ORAL_TABLET | Freq: Every day | ORAL | 1 refills | Status: DC

## 2016-09-26 NOTE — Telephone Encounter (Signed)
Patient left a voicemail requesting a refill on Montelukast. Patient stated that this has been filled by her previous doctor and now needs it filled by Allie Bossier NP. Medication on list and refilled per patient's request.  Patient notified by telephone that script has been sent to the pharmacy per her request.

## 2016-10-10 ENCOUNTER — Encounter: Payer: Self-pay | Admitting: Primary Care

## 2016-10-10 ENCOUNTER — Ambulatory Visit (INDEPENDENT_AMBULATORY_CARE_PROVIDER_SITE_OTHER): Payer: Medicare HMO | Admitting: Primary Care

## 2016-10-10 VITALS — BP 124/80 | HR 88 | Temp 97.8°F | Ht 60.0 in | Wt 133.1 lb

## 2016-10-10 DIAGNOSIS — J309 Allergic rhinitis, unspecified: Secondary | ICD-10-CM

## 2016-10-10 DIAGNOSIS — R05 Cough: Secondary | ICD-10-CM

## 2016-10-10 DIAGNOSIS — J4540 Moderate persistent asthma, uncomplicated: Secondary | ICD-10-CM | POA: Diagnosis not present

## 2016-10-10 DIAGNOSIS — R21 Rash and other nonspecific skin eruption: Secondary | ICD-10-CM | POA: Diagnosis not present

## 2016-10-10 DIAGNOSIS — E785 Hyperlipidemia, unspecified: Secondary | ICD-10-CM

## 2016-10-10 DIAGNOSIS — R053 Chronic cough: Secondary | ICD-10-CM

## 2016-10-10 MED ORDER — OMEPRAZOLE 20 MG PO CPDR: 20.0000 mg | DELAYED_RELEASE_CAPSULE | Freq: Every day | ORAL | 0 refills | Status: DC

## 2016-10-10 MED ORDER — LEVOCETIRIZINE DIHYDROCHLORIDE 5 MG PO TABS: 5.0000 mg | ORAL_TABLET | Freq: Every evening | ORAL | 0 refills | Status: DC

## 2016-10-10 MED ORDER — TRIAMCINOLONE ACETONIDE 0.5 % EX OINT
1.0000 | TOPICAL_OINTMENT | Freq: Two times a day (BID) | CUTANEOUS | 0 refills | Status: DC
Start: 2016-10-10 — End: 2018-01-16

## 2016-10-10 NOTE — Progress Notes (Signed)
Subjective:    Patient ID: [name], female    DOB: 21-Jan-1941, 76 y.o.   MRN: [id number]  HPI  [name] is a 76 year old female who presents today to discuss multiple complaints.  1) Rash: Located to lower anterior neck that has been present intermittently for two years. She's been using triamcinolone 1% cream that was prescribed by her previous PCP with some improvement, but no resolve. She denies itching, sometimes painful.   2) Asthma: Persistent cough and post nasal drip throughout the day. She's currently managed on Symbicort 160 mcg that was increased in April 2018 which improved her mucous congestion. She's using her Ventolin inhaler once weekly but doesn't experience much improvement in cough. She's compliant to her Singulair and Allegra. She thinks she's tried PPI's in the past but doesn't remember if it helped with her chronic cough.  3) Muscle Cramping: Occur to her calves, mostly her left calf. This has been present for several months, more frequently since starting Pravastatin. She will experience movement of toes in different directions. She denies recent injury/trauma, swelling. The cramping will occur once monthly on average. The cramping only occurs at night when sleeping, will dissipate quickly when standing.   Review of Systems  Constitutional: Negative for fever.  HENT: Negative for congestion.   Respiratory: Positive for cough. Negative for shortness of breath and wheezing.   Cardiovascular: Negative for chest pain.  Musculoskeletal: Positive for myalgias. Negative for back pain.  Skin: Positive for rash. Negative for color change and wound.  Allergic/Immunologic: Positive for environmental allergies.       Past Medical History:  Diagnosis Date  . Arthritis   . Asthma   . Cancer of breast ([place])    33 years ago, left,   . Hearing loss    both ears, wwears hearing aides  . Hyperlipidemia   . Macular degeneration   . Osteopenia      Social History    Social History  . Marital status: Married    Spouse name: N/A  . Number of children: N/A  . Years of education: N/A   Occupational History  . retired    Social History Main Topics  . Smoking status: Never Smoker  . Smokeless tobacco: Never Used  . Alcohol use Yes     Comment: glass wine 1x month  . Drug use: No  . Sexual activity: Not on file   Other Topics Concern  . Not on file   Social History Narrative   Married.   2 children, 6 grandchildren.   Retired. Previously was Pharmacist, hospital.    Enjoys participating in church.     Past Surgical History:  Procedure Laterality Date  . ABDOMINAL HYSTERECTOMY    . BREAST ENHANCEMENT SURGERY    . CESAREAN SECTION     x 2  . COLONOSCOPY WITH PROPOFOL N/A 04/21/2014   Procedure: COLONOSCOPY WITH PROPOFOL;  Surgeon: Garlan Fair, MD;  Location: [facility];  Service: Endoscopy;  Laterality: N/A;  . EYE SURGERY Bilateral march 2015 and june 2015   lens for cataracts  . LEFT HEART CATHETERIZATION WITH CORONARY ANGIOGRAM N/A 01/24/2012   Procedure: LEFT HEART CATHETERIZATION WITH CORONARY ANGIOGRAM;  Surgeon: Sueanne Margarita, MD;  Location: [place] [facility];  Service: Cardiovascular;  Laterality: N/A;  . MASTECTOMY  33 yrs ago   bilateral    Family History  Problem Relation Age of Onset  . Macular degeneration Mother   . Heart attack Father  Allergies  Allergen Reactions  . Fosamax [Alendronate Sodium] Nausea Only  . Sulfonamide Derivatives Rash    Current Outpatient Prescriptions on File Prior to Visit  Medication Sig Dispense Refill  . albuterol (PROVENTIL HFA;VENTOLIN HFA) 108 (90 Base) MCG/ACT inhaler Inhale 2 puffs into the lungs every 6 (six) hours as needed for wheezing or shortness of breath.    . budesonide-formoterol (SYMBICORT) 160-4.5 MCG/ACT inhaler Inhale 2 puffs into the lungs 2 (two) times daily. 1 Inhaler 3  . calcium carbonate (TUMS) 500 MG chewable tablet Chew 1,000 mg by mouth at bedtime.     .  Cholecalciferol (VITAMIN D) 1000 UNITS capsule Take 1,000 Units by mouth daily with breakfast.     . DYMISTA 137-50 MCG/ACT SUSP USE 1 PUFF IN EACH NOSTRIL TWICE A DAY NASALLY  5  . meloxicam (MOBIC) 15 MG tablet Take 15 mg by mouth daily. Reported on 04/14/2015    . montelukast (SINGULAIR) 10 MG tablet Take 1 tablet (10 mg total) by mouth at bedtime. 30 tablet 1  . Multiple Vitamins-Minerals (MULTI FOR HER 50+) TABS Take 1 tablet by mouth daily.    . Multiple Vitamins-Minerals (PRESERVISION/LUTEIN) CAPS Take 1 capsule by mouth 2 (two) times daily.    . pravastatin (PRAVACHOL) 20 MG tablet Take 20 mg by mouth daily.    . raloxifene (EVISTA) 60 MG tablet Take 60 mg by mouth daily with breakfast. Reported on 04/14/2015     No current facility-administered medications on file prior to visit.     BP 124/80   Pulse 88   Temp 97.8 F (36.6 C) (Oral)   Ht 5' (1.524 m)   Wt 133 lb 1.9 oz (60.4 kg)   BMI 26.00 kg/m    Objective:   Physical Exam  Constitutional: She appears well-nourished.  HENT:  Right Ear: Tympanic membrane and ear canal normal.  Left Ear: Tympanic membrane and ear canal normal.  Nose: Right sinus exhibits no maxillary sinus tenderness and no frontal sinus tenderness. Left sinus exhibits no maxillary sinus tenderness and no frontal sinus tenderness.  Mouth/Throat: Oropharynx is clear and moist.  Eyes: Conjunctivae are normal.  Neck: Neck supple.  Cardiovascular: Normal rate and regular rhythm.   Pulmonary/Chest: Effort normal and breath sounds normal. She has no wheezes. She has no rales.  Lymphadenopathy:    She has no cervical adenopathy.  Skin: Skin is warm and dry.          Assessment & Plan:

## 2016-10-10 NOTE — Assessment & Plan Note (Signed)
Myalgias likely coming from pravastatin as symptoms started around the time she started taking. Overall cramping is tolerable. Will have her continue for now, consider taking every other day. She will call if symptoms become bothersome.

## 2016-10-10 NOTE — Assessment & Plan Note (Signed)
Switch Allegra to Xyzal. Continue Singulair.

## 2016-10-10 NOTE — Assessment & Plan Note (Signed)
Persistent cough. Overall less mucous congestion since increase in Symbicort dose. Will have her continue Symbicort. Switch Allegra to Xyzal. Add omeprazole 20 mg once daily for 6 weeks. Continue Singulair. If no improvement consider pulmonology referral.

## 2016-10-10 NOTE — Patient Instructions (Addendum)
Stop fexofenadine (Allegra), start levocetirizine (Xyzal) 5 mg for cough. Take 1 tablet by mouth at bedtime.  Start omeprazole 20 mg tablets. Take 1 tablet by mouth once daily.  Continue Symbicort twice daily, everyday. Continue Ventolin inhaler as needed for cough and shortness of breath.  Start triamcinolone 0.5% ointment. Apply twice daily as needed.  Follow up in 3-4 weeks if no improvement.  It was a pleasure to see you today!

## 2016-10-20 ENCOUNTER — Other Ambulatory Visit: Payer: Self-pay

## 2016-10-20 DIAGNOSIS — M8580 Other specified disorders of bone density and structure, unspecified site: Secondary | ICD-10-CM

## 2016-10-20 DIAGNOSIS — Z853 Personal history of malignant neoplasm of breast: Secondary | ICD-10-CM

## 2016-10-20 MED ORDER — RALOXIFENE HCL 60 MG PO TABS: 60.0000 mg | ORAL_TABLET | Freq: Every day | ORAL | 2 refills | Status: DC

## 2016-10-20 NOTE — Telephone Encounter (Signed)
Pt left v/m requesting refill raloxifene 60 mg; pt established care on 07/18/16;Please advise.

## 2016-10-20 NOTE — Telephone Encounter (Signed)
Refill sent to pharmacy. Bone density scan UTD.

## 2016-11-08 ENCOUNTER — Telehealth: Payer: Self-pay | Admitting: Primary Care

## 2016-11-08 ENCOUNTER — Ambulatory Visit (INDEPENDENT_AMBULATORY_CARE_PROVIDER_SITE_OTHER)
Admission: RE | Admit: 2016-11-08 | Discharge: 2016-11-08 | Disposition: A | Discharge status: Home / Self Care | Payer: Medicare HMO | Source: Ambulatory Visit | Attending: Internal Medicine | Admitting: Internal Medicine

## 2016-11-08 ENCOUNTER — Encounter: Payer: Self-pay | Admitting: Internal Medicine

## 2016-11-08 ENCOUNTER — Ambulatory Visit (INDEPENDENT_AMBULATORY_CARE_PROVIDER_SITE_OTHER): Payer: Medicare HMO | Admitting: Internal Medicine

## 2016-11-08 VITALS — BP 120/72 | HR 96 | Temp 98.3°F | Wt 130.3 lb

## 2016-11-08 DIAGNOSIS — R509 Fever, unspecified: Secondary | ICD-10-CM | POA: Diagnosis not present

## 2016-11-08 DIAGNOSIS — R0602 Shortness of breath: Secondary | ICD-10-CM

## 2016-11-08 DIAGNOSIS — R059 Cough, unspecified: Secondary | ICD-10-CM

## 2016-11-08 DIAGNOSIS — R05 Cough: Secondary | ICD-10-CM | POA: Diagnosis not present

## 2016-11-08 NOTE — Progress Notes (Signed)
Subjective:    Patient ID: [name], female    DOB: Aug 17, 1940, 76 y.o.   MRN: [id number]  HPI  Pt presents to the clinic today with c/o cough, fever and shortness of breath. She reports this started 2 days ago. The cough is dry and nonproductive. The shortness of breath is mainly with coughing. Her fever has been as high as 100.3. She denies runny nose, nasal congestion, ear pain or sore throat. She denies chills or body aches. She has tried Tylenol, Xyzal,Singulair and Symbicort with minimal relief. She has a history of seasonal allergies and asthma. She has not had sick contacts that she is aware of.  Review of Systems      Past Medical History:  Diagnosis Date  . Arthritis   . Asthma   . Cancer of breast ([place])    33 years ago, left,   . Hearing loss    both ears, wwears hearing aides  . Hyperlipidemia   . Macular degeneration   . Osteopenia     Current Outpatient Prescriptions  Medication Sig Dispense Refill  . albuterol (PROVENTIL HFA;VENTOLIN HFA) 108 (90 Base) MCG/ACT inhaler Inhale 2 puffs into the lungs every 6 (six) hours as needed for wheezing or shortness of breath.    . budesonide-formoterol (SYMBICORT) 160-4.5 MCG/ACT inhaler Inhale 2 puffs into the lungs 2 (two) times daily. 1 Inhaler 3  . calcium carbonate (TUMS) 500 MG chewable tablet Chew 1,000 mg by mouth at bedtime.     . Cholecalciferol (VITAMIN D) 1000 UNITS capsule Take 1,000 Units by mouth daily with breakfast.     . DYMISTA 137-50 MCG/ACT SUSP USE 1 PUFF IN EACH NOSTRIL TWICE A DAY NASALLY  5  . levocetirizine (XYZAL) 5 MG tablet Take 1 tablet (5 mg total) by mouth every evening. 90 tablet 0  . meloxicam (MOBIC) 15 MG tablet Take 15 mg by mouth daily. Reported on 04/14/2015    . montelukast (SINGULAIR) 10 MG tablet Take 1 tablet (10 mg total) by mouth at bedtime. 30 tablet 1  . Multiple Vitamins-Minerals (MULTI FOR HER 50+) TABS Take 1 tablet by mouth daily.    . Multiple Vitamins-Minerals  (PRESERVISION/LUTEIN) CAPS Take 1 capsule by mouth 2 (two) times daily.    Marland Kitchen omeprazole (PRILOSEC) 20 MG capsule Take 1 capsule (20 mg total) by mouth daily. 90 capsule 0  . raloxifene (EVISTA) 60 MG tablet Take 1 tablet (60 mg total) by mouth daily with breakfast. 90 tablet 2  . triamcinolone ointment (KENALOG) 0.5 % Apply 1 application topically 2 (two) times daily. 30 g 0  . pravastatin (PRAVACHOL) 20 MG tablet Take 20 mg by mouth daily.     No current facility-administered medications for this visit.     Allergies  Allergen Reactions  . Fosamax [Alendronate Sodium] Nausea Only  . Sulfonamide Derivatives Rash    Family History  Problem Relation Age of Onset  . Macular degeneration Mother   . Heart attack Father     Social History   Social History  . Marital status: Married    Spouse name: N/A  . Number of children: N/A  . Years of education: N/A   Occupational History  . retired    Social History Main Topics  . Smoking status: Never Smoker  . Smokeless tobacco: Never Used  . Alcohol use Yes     Comment: glass wine 1x month  . Drug use: No  . Sexual activity: Not on file  Other Topics Concern  . Not on file   Social History Narrative   Married.   2 children, 6 grandchildren.   Retired. Previously was Pharmacist, hospital.    Enjoys participating in church.      Constitutional: Pt reports fever. Denies malaise, fatigue, headache or abrupt weight changes.  HEENT: Denies eye pain, eye redness, ear pain, ringing in the ears, wax buildup, runny nose, nasal congestion, bloody nose, or sore throat. Respiratory: Pt reports cough and shortness of breath. Denies difficulty breathing, or sputum production.   Cardiovascular: Denies chest pain, chest tightness, palpitations or swelling in the hands or feet.   No other specific complaints in a complete review of systems (except as listed in HPI above).  Objective:   Physical Exam  BP 120/72   Pulse 96   Temp 98.3 F (36.8 C)  (Oral)   Wt 130 lb 5.6 oz (59.1 kg)   SpO2 97%   BMI 25.46 kg/m  Wt Readings from Last 3 Encounters:  11/08/16 130 lb 5.6 oz (59.1 kg)  10/10/16 133 lb 1.9 oz (60.4 kg)  07/18/16 135 lb 12.8 oz (61.6 kg)    General: Appears her stated age, well developed, well nourished in NAD. HEENT:  Throat/Mouth: Teeth present, mucosa pink and moist, no exudate, lesions or ulcerations noted.  Neck:  No adenopathy noted. Cardiovascular: Normal rate and rhythm. Pulmonary/Chest: Normal effort and positive vesicular breath sounds. No respiratory distress. No wheezes, rales or ronchi noted.   BMET    Component Value Date/Time   NA 143 04/28/2015 0753   K 4.0 04/28/2015 0753   CL 105 04/28/2015 0753   CO2 29 04/28/2015 0753   GLUCOSE 78 04/28/2015 0753   BUN 20 04/28/2015 0753   CREATININE 0.71 04/28/2015 0753   CALCIUM 9.9 04/28/2015 0753   GFRNONAA >60 08/31/2014 0528   GFRAA >60 08/31/2014 0528    Lipid Panel     Component Value Date/Time   CHOL 143 09/21/2016 0837   TRIG 184.0 (H) 09/21/2016 0837   HDL 37.20 (L) 09/21/2016 0837   CHOLHDL 4 09/21/2016 0837   VLDL 36.8 09/21/2016 0837   LDLCALC 69 09/21/2016 0837    CBC    Component Value Date/Time   WBC 18.0 cH (HH) 09/05/2014 1654   RBC 3.32 (L) 09/05/2014 1654   HGB 9.3 (L) 09/05/2014 1654   HCT 28.9 (L) 09/05/2014 1654   PLT 759.0 (H) 09/05/2014 1654   MCV 87.0 09/05/2014 1654   MCH 27.4 08/31/2014 0528   MCHC 32.2 09/05/2014 1654   RDW 16.5 (H) 09/05/2014 1654   LYMPHSABS 2.9 09/05/2014 1654   MONOABS 1.6 (H) 09/05/2014 1654   EOSABS 0.1 09/05/2014 1654   BASOSABS 0.1 09/05/2014 1654    Hgb A1C No results found for: HGBA1C          Assessment & Plan:   Fever, Cough, SOB:  Exam benign She has history of double pneumonia, will obtain chest xray today She will continue treatment with RX meds and OTC Tylenol for now  Will followup after chest xray, return precautions discussed Webb Silversmith, NP

## 2016-11-08 NOTE — Patient Instructions (Signed)

## 2016-11-08 NOTE — Telephone Encounter (Signed)
Pt has appt with R Baity NP 11/08/16 at 3:45 PM.

## 2016-11-08 NOTE — Telephone Encounter (Signed)
°  Patient Name: [name]  DOB: 1940-12-17    Initial Comment Caller states she has had a fever for two days, 100.3 and not sure what is causing it    Nurse Assessment  Nurse: Joline Salt, RN, Malachy Mood Date/Time (Eastern Time): 11/08/2016 2:56:25 PM  Confirm and document reason for call. If symptomatic, describe symptoms. ---Caller states she has had a fever for two days, 100.3 and not sure what is causing it . She states she has been on vacation for 2 weeks in [place] and [place]. Frequent BM's but not diarrhea. No cramping, no vomiting or nausea.  Does the patient have any new or worsening symptoms? ---Yes  Will a triage be completed? ---Yes  Related visit to physician within the last 2 weeks? ---No  Does the PT have any chronic conditions? (i.e. diabetes, asthma, etc.) ---Yes  List chronic conditions. ---asthma  Is this a behavioral health or substance abuse call? ---No     Guidelines    Guideline Title Affirmed Question Affirmed Notes  Fever [1] Fever > 100.0 F (37.8 C) AND [2] bedridden (e.g., nursing home patient, CVA, chronic illness, recovering from surgery)    Final Disposition User   See Physician within 4 Hours (or PCP triage) Joline Salt, RN, Malachy Mood    Comments  Temp taken with oral thermometer  Warm transferred to [facility] and appointment made for her to be seen today at 345.   Referrals  REFERRED TO PCP OFFICE   Disagree/Comply: Comply

## 2016-11-17 ENCOUNTER — Other Ambulatory Visit: Payer: Self-pay | Admitting: Primary Care

## 2016-11-17 DIAGNOSIS — J4540 Moderate persistent asthma, uncomplicated: Secondary | ICD-10-CM

## 2016-11-18 ENCOUNTER — Other Ambulatory Visit: Payer: Self-pay | Admitting: Primary Care

## 2016-11-22 ENCOUNTER — Other Ambulatory Visit: Payer: Self-pay | Admitting: Primary Care

## 2016-12-05 ENCOUNTER — Ambulatory Visit (INDEPENDENT_AMBULATORY_CARE_PROVIDER_SITE_OTHER): Payer: Medicare HMO | Admitting: Primary Care

## 2016-12-05 DIAGNOSIS — J309 Allergic rhinitis, unspecified: Secondary | ICD-10-CM | POA: Diagnosis not present

## 2016-12-05 DIAGNOSIS — J4540 Moderate persistent asthma, uncomplicated: Secondary | ICD-10-CM | POA: Diagnosis not present

## 2016-12-05 MED ORDER — MELOXICAM 15 MG PO TABS: 15.0000 mg | ORAL_TABLET | Freq: Every day | ORAL | 1 refills | Status: DC | PRN

## 2016-12-05 MED ORDER — MONTELUKAST SODIUM 10 MG PO TABS: ORAL_TABLET | ORAL | 3 refills | Status: DC

## 2016-12-05 NOTE — Progress Notes (Signed)
Subjective:    Patient ID: [name], female    DOB: 1940/07/26, 76 y.o.   MRN: [id number]  HPI  [name] is a 76 year old female with a history of allergic rhinitis, asthma who presents today with multiple complaints.  1) Jaw Pain: Located to the left TMJ. She denies tenderness with palpation, but will experience pain with opening her mouth to eat. Pain will progress throughout the day. Her mouth is very stiff first thing in the morning, does experience pain with eating breakfast. She first noticed her symptoms one month ago after visiting the dentist, since then has noticed pain is now present without eating. She's also noticed left ear pain.   When evaluated by a dentist one month ago she was told that she has "calcium deposits" to the jaw bilaterally, this was noted via x-ray. She's taken tylenol and Meloxicam without improvement. She denies grinding her teeth at night, has not tried a mouth guard.  2) Allergic Rhinitis/Asthma: Chronic cough with mucous production. She's noticed improvement to her chronic cough since taking Xyzal, Singulair, and omeprazole 20 mg. She does have some mucous buildup but overall improved. She's using Dymista once daily which is effective, but this is costly. She has used Flonase in the past without improvement, no recent use. She is needing refills of Singulair.   3) Myalgias: Occurred with pravastatin which were located to the calves and feet. She stopped taking pravastatin about one month ago and has had no myalgias since. She did not try taking this medication every other day.  Review of Systems  Constitutional: Negative for fever.  HENT:       Left TMJ pain  Respiratory: Negative for shortness of breath.        Chronic cough has improved. Has noticed significant improvement in shortness of breath with Symbicort.  Musculoskeletal: Negative for myalgias.  Allergic/Immunologic: Positive for environmental allergies.       Past Medical History:    Diagnosis Date  . Arthritis   . Asthma   . Cancer of breast ([name])    33 years ago, left,   . Hearing loss    both ears, wwears hearing aides  . Hyperlipidemia   . Macular degeneration   . Osteopenia      Social History   Social History  . Marital status: Married    Spouse name: N/A  . Number of children: N/A  . Years of education: N/A   Occupational History  . retired    Social History Main Topics  . Smoking status: Never Smoker  . Smokeless tobacco: Never Used  . Alcohol use Yes     Comment: glass wine 1x month  . Drug use: No  . Sexual activity: Not on file   Other Topics Concern  . Not on file   Social History Narrative   Married.   2 children, 6 grandchildren.   Retired. Previously was Pharmacist, hospital.    Enjoys participating in church.     Past Surgical History:  Procedure Laterality Date  . ABDOMINAL HYSTERECTOMY    . BREAST ENHANCEMENT SURGERY    . CESAREAN SECTION     x 2  . COLONOSCOPY WITH PROPOFOL N/A 04/21/2014   Procedure: COLONOSCOPY WITH PROPOFOL;  Surgeon: Garlan Fair, MD;  Location: [facility];  Service: Endoscopy;  Laterality: N/A;  . EYE SURGERY Bilateral march 2015 and june 2015   lens for cataracts  . LEFT HEART CATHETERIZATION WITH CORONARY ANGIOGRAM N/A 01/24/2012  Procedure: LEFT HEART CATHETERIZATION WITH CORONARY ANGIOGRAM;  Surgeon: Sueanne Margarita, MD;  Location: [place] CATH LAB;  Service: Cardiovascular;  Laterality: N/A;  . MASTECTOMY  33 yrs ago   bilateral    Family History  Problem Relation Age of Onset  . Macular degeneration Mother   . Heart attack Father     Allergies  Allergen Reactions  . Fosamax [Alendronate Sodium] Nausea Only  . Sulfonamide Derivatives Rash    Current Outpatient Prescriptions on File Prior to Visit  Medication Sig Dispense Refill  . albuterol (PROVENTIL HFA;VENTOLIN HFA) 108 (90 Base) MCG/ACT inhaler Inhale 2 puffs into the lungs every 6 (six) hours as needed for wheezing or shortness of  breath.    . calcium carbonate (TUMS) 500 MG chewable tablet Chew 1,000 mg by mouth at bedtime.     . Cholecalciferol (VITAMIN D) 1000 UNITS capsule Take 1,000 Units by mouth daily with breakfast.     . DYMISTA 137-50 MCG/ACT SUSP USE 1 PUFF IN EACH NOSTRIL TWICE A DAY NASALLY  5  . levocetirizine (XYZAL) 5 MG tablet Take 1 tablet (5 mg total) by mouth every evening. 90 tablet 0  . Multiple Vitamins-Minerals (MULTI FOR HER 50+) TABS Take 1 tablet by mouth daily.    . Multiple Vitamins-Minerals (PRESERVISION/LUTEIN) CAPS Take 1 capsule by mouth 2 (two) times daily.    Marland Kitchen omeprazole (PRILOSEC) 20 MG capsule Take 1 capsule (20 mg total) by mouth daily. 90 capsule 0  . raloxifene (EVISTA) 60 MG tablet Take 1 tablet (60 mg total) by mouth daily with breakfast. 90 tablet 2  . SYMBICORT 160-4.5 MCG/ACT inhaler INHALE 2 PUFFS INTO THE LUNGS 2 (TWO) TIMES DAILY. 10.2 Inhaler 5  . triamcinolone ointment (KENALOG) 0.5 % Apply 1 application topically 2 (two) times daily. 30 g 0  . pravastatin (PRAVACHOL) 20 MG tablet Take 20 mg by mouth daily.     No current facility-administered medications on file prior to visit.     BP 120/70   Pulse 93   Temp (!) 97.5 F (36.4 C) (Oral)   Ht 5' (1.524 m)   Wt 133 lb 12.8 oz (60.7 kg)   SpO2 96%   BMI 26.13 kg/m    Objective:   Physical Exam  Constitutional: She appears well-nourished.  HENT:  Right Ear: Tympanic membrane and ear canal normal.  Left Ear: Tympanic membrane and ear canal normal.  Nose: Right sinus exhibits no maxillary sinus tenderness and no frontal sinus tenderness. Left sinus exhibits no maxillary sinus tenderness and no frontal sinus tenderness.  Mouth/Throat: Oropharynx is clear and moist. No oral lesions. Normal dentition. No dental abscesses or dental caries.  Decrease in range of motion to TMJ. No clicking or malalignment noted.  Eyes: Conjunctivae are normal.  Neck: Neck supple.  Cardiovascular: Normal rate and regular rhythm.     Pulmonary/Chest: Effort normal and breath sounds normal. She has no wheezes. She has no rales.  Lymphadenopathy:    She has no cervical adenopathy.  Skin: Skin is warm and dry.          Assessment & Plan:  TMJ pain:  Located to left side 1 month. Exam today with evidence of decrease in range of motion, otherwise unremarkable. She will follow-up with her dentist for further evaluation. Also recommended mouthguard at night in case she is clenching/grinding teeth.  Sheral Flow, NP

## 2016-12-05 NOTE — Assessment & Plan Note (Signed)
Improved with initiation of Symbicort, continue same. Continue Singulair.

## 2016-12-05 NOTE — Assessment & Plan Note (Signed)
Improved with Singulair, Xyzal, Dymista. We'll have her try Singulair and Flonase moving forward to see if this is enough to control symptoms. She will update if not. Dymista is too expensive.

## 2016-12-05 NOTE — Patient Instructions (Signed)
Purchase a night guard to ensure you are not grinding your teeth or applying increased pressure to your jaw while you sleep.  I sent refills for Meloxicam and montelukast to your pharmacy.  Try holding Xyzal as discussed.  Try switching from Dymista to Flonase, update me if no improvement.   Schedule a lab only appointment in 1 month to recheck your cholesterol. Make sure you are fasting 8 hours prior. You may have water and black coffee.  It was a pleasure to see you today!

## 2016-12-26 DIAGNOSIS — H353132 Nonexudative age-related macular degeneration, bilateral, intermediate dry stage: Secondary | ICD-10-CM | POA: Diagnosis not present

## 2017-01-03 ENCOUNTER — Telehealth: Payer: Self-pay | Admitting: Primary Care

## 2017-01-03 NOTE — Telephone Encounter (Signed)
Please notify patient that she is on the max dose of the meloxicam. What is she taking it for? How often does she take this?

## 2017-01-03 NOTE — Telephone Encounter (Signed)
Pt called because she has an rx from her old pcp for myloxicam 15mg  and she needs a new prescription.  She says she has taken it for several years and it doesn't seem to help her as much as it once did and would like the dose increased.  Can you please call her about this.

## 2017-01-04 NOTE — Telephone Encounter (Signed)
Please notify patient that I'd like to examine her leg and discuss her symptoms in greater detail. Please schedule an appointment so we can treat.

## 2017-01-04 NOTE — Telephone Encounter (Signed)
Spoken and notified patient of Kate's comments.   She stated that she take it for the pain in her left leg.  She stated that meloxicam did help in the beginning but now not so much.  Patient stated that is there something else she can take then?

## 2017-01-05 ENCOUNTER — Ambulatory Visit (INDEPENDENT_AMBULATORY_CARE_PROVIDER_SITE_OTHER): Payer: Medicare HMO

## 2017-01-05 ENCOUNTER — Other Ambulatory Visit (INDEPENDENT_AMBULATORY_CARE_PROVIDER_SITE_OTHER): Payer: Medicare HMO

## 2017-01-05 DIAGNOSIS — R748 Abnormal levels of other serum enzymes: Secondary | ICD-10-CM

## 2017-01-05 DIAGNOSIS — Z23 Encounter for immunization: Secondary | ICD-10-CM | POA: Diagnosis not present

## 2017-01-05 LAB — HEPATIC FUNCTION PANEL
ALK PHOS: 71 U/L (ref 39–117)
ALT: 14 U/L (ref 0–35)
AST: 17 U/L (ref 0–37)
Albumin: 4 g/dL (ref 3.5–5.2)
BILIRUBIN DIRECT: 0.1 mg/dL (ref 0.0–0.3)
TOTAL PROTEIN: 7.1 g/dL (ref 6.0–8.3)
Total Bilirubin: 0.4 mg/dL (ref 0.2–1.2)

## 2017-01-05 NOTE — Telephone Encounter (Signed)
Patient made appt on 01/06/2017

## 2017-01-06 ENCOUNTER — Other Ambulatory Visit: Payer: Self-pay | Admitting: Primary Care

## 2017-01-06 ENCOUNTER — Ambulatory Visit (INDEPENDENT_AMBULATORY_CARE_PROVIDER_SITE_OTHER): Payer: Medicare HMO | Admitting: Primary Care

## 2017-01-06 ENCOUNTER — Encounter: Payer: Self-pay | Admitting: Primary Care

## 2017-01-06 VITALS — BP 122/66 | HR 90 | Temp 97.8°F | Ht 60.0 in | Wt 133.4 lb

## 2017-01-06 DIAGNOSIS — M549 Dorsalgia, unspecified: Secondary | ICD-10-CM

## 2017-01-06 DIAGNOSIS — M5442 Lumbago with sciatica, left side: Secondary | ICD-10-CM

## 2017-01-06 DIAGNOSIS — R053 Chronic cough: Secondary | ICD-10-CM

## 2017-01-06 DIAGNOSIS — R05 Cough: Secondary | ICD-10-CM

## 2017-01-06 DIAGNOSIS — G8929 Other chronic pain: Secondary | ICD-10-CM

## 2017-01-06 MED ORDER — DICLOFENAC SODIUM 75 MG PO TBEC
75.0000 mg | DELAYED_RELEASE_TABLET | Freq: Two times a day (BID) | ORAL | 0 refills | Status: DC
Start: 2017-01-06 — End: 2017-01-25

## 2017-01-06 NOTE — Progress Notes (Signed)
Subjective:    Patient ID: [name], female    DOB: 09-10-1940, 76 y.o.   MRN: [id number]  HPI  [name] is a 76 year old female who presents today with a chief complaint of lower extremity pain. Her pain is located to the mid/left lower back with radiation to her left lower extremity that has been present since January 2013. In 2013 she fell and "broke her legs".   She thinks she underwent an MRI of her lumbar spine and saw an neurosurgeon in 2013. She's never undergone injections to her back. She went and saw her PCP back in 2013 who prescribed Meloxicam 15 mg for which she's taken for years. She tries to stretch and exercise her lower back everyday with improvement to her back and buttocks pain but not to her lower extremity pain.   Her leg is "achy" with sitting and walking upstairs. She has no problems when she's walking as she walks one mile daily. She denies weakness, numbness/tingling, injury, loss of bowel/bladder function.  Review of Systems  Musculoskeletal: Positive for back pain.  Skin: Negative for color change.  Neurological: Negative for weakness and numbness.       Past Medical History:  Diagnosis Date  . Arthritis   . Asthma   . Cancer of breast ([place])    33 years ago, left,   . Hearing loss    both ears, wwears hearing aides  . Hyperlipidemia   . Macular degeneration   . Osteopenia      Social History   Social History  . Marital status: Married    Spouse name: N/A  . Number of children: N/A  . Years of education: N/A   Occupational History  . retired    Social History Main Topics  . Smoking status: Never Smoker  . Smokeless tobacco: Never Used  . Alcohol use Yes     Comment: glass wine 1x month  . Drug use: No  . Sexual activity: Not on file   Other Topics Concern  . Not on file   Social History Narrative   Married.   2 children, 6 grandchildren.   Retired. Previously was Pharmacist, hospital.    Enjoys participating in church.     Past  Surgical History:  Procedure Laterality Date  . ABDOMINAL HYSTERECTOMY    . BREAST ENHANCEMENT SURGERY    . CESAREAN SECTION     x 2  . COLONOSCOPY WITH PROPOFOL N/A 04/21/2014   Procedure: COLONOSCOPY WITH PROPOFOL;  Surgeon: Garlan Fair, MD;  Location: [facility];  Service: Endoscopy;  Laterality: N/A;  . EYE SURGERY Bilateral march 2015 and june 2015   lens for cataracts  . LEFT HEART CATHETERIZATION WITH CORONARY ANGIOGRAM N/A 01/24/2012   Procedure: LEFT HEART CATHETERIZATION WITH CORONARY ANGIOGRAM;  Surgeon: Sueanne Margarita, MD;  Location: [facility];  Service: Cardiovascular;  Laterality: N/A;  . MASTECTOMY  33 yrs ago   bilateral    Family History  Problem Relation Age of Onset  . Macular degeneration Mother   . Heart attack Father     Allergies  Allergen Reactions  . Fosamax [Alendronate Sodium] Nausea Only  . Sulfonamide Derivatives Rash    Current Outpatient Prescriptions on File Prior to Visit  Medication Sig Dispense Refill  . albuterol (PROVENTIL HFA;VENTOLIN HFA) 108 (90 Base) MCG/ACT inhaler Inhale 2 puffs into the lungs every 6 (six) hours as needed for wheezing or shortness of breath.    . calcium  carbonate (TUMS) 500 MG chewable tablet Chew 1,000 mg by mouth at bedtime.     . Cholecalciferol (VITAMIN D) 1000 UNITS capsule Take 1,000 Units by mouth daily with breakfast.     . DYMISTA 137-50 MCG/ACT SUSP USE 1 PUFF IN EACH NOSTRIL TWICE A DAY NASALLY  5  . levocetirizine (XYZAL) 5 MG tablet Take 1 tablet (5 mg total) by mouth every evening. 90 tablet 0  . Multiple Vitamins-Minerals (MULTI FOR HER 50+) TABS Take 1 tablet by mouth daily.    . Multiple Vitamins-Minerals (PRESERVISION/LUTEIN) CAPS Take 1 capsule by mouth 2 (two) times daily.    Marland Kitchen omeprazole (PRILOSEC) 20 MG capsule Take 1 capsule (20 mg total) by mouth daily. 90 capsule 0  . pravastatin (PRAVACHOL) 20 MG tablet Take 20 mg by mouth daily.    . raloxifene (EVISTA) 60 MG tablet Take 1 tablet  (60 mg total) by mouth daily with breakfast. 90 tablet 2  . SYMBICORT 160-4.5 MCG/ACT inhaler INHALE 2 PUFFS INTO THE LUNGS 2 (TWO) TIMES DAILY. 10.2 Inhaler 5  . triamcinolone ointment (KENALOG) 0.5 % Apply 1 application topically 2 (two) times daily. 30 g 0  . montelukast (SINGULAIR) 10 MG tablet TAKE 1 TABLET BY MOUTH EVERYDAY AT BEDTIME (Patient not taking: Reported on 01/06/2017) 90 tablet 3   No current facility-administered medications on file prior to visit.     BP 122/66   Pulse 90   Temp 97.8 F (36.6 C) (Oral)   Ht 5' (1.524 m)   Wt 133 lb 6.4 oz (60.5 kg)   SpO2 97%   BMI 26.05 kg/m    Objective:   Physical Exam  Constitutional: She appears well-nourished.  Neck: Neck supple.  Cardiovascular: Normal rate and regular rhythm.   Pulmonary/Chest: Effort normal and breath sounds normal.  Musculoskeletal:       Lumbar back: She exhibits decreased range of motion and pain.       Back:  No significant decrease in ROM when standing, decrease in ROM with supine position.   Skin: Skin is warm and dry.          Assessment & Plan:

## 2017-01-06 NOTE — Assessment & Plan Note (Addendum)
Managed on meloxicam for years, no longer effective. Discussed options for evaluation including xray, physical therapy, neurosurgery evaluation, different medication. She opts for different medication.   Renal function reviewed in chart which is stable. Repeat BMP in December. Rx for diclofenac tablets sent to pharmacy. Stop Meloxicam. She will update in 2 weeks. Continue stretching exercises.

## 2017-01-06 NOTE — Patient Instructions (Signed)
Stop Meloxicam tablets.  Start diclofenac 75 mg tablets for pain. Take 1 tablet by mouth once to twice daily as needed for pain.  Please update me in a few weeks.  It was a pleasure to see you today!

## 2017-01-09 ENCOUNTER — Other Ambulatory Visit: Payer: Self-pay

## 2017-01-24 ENCOUNTER — Other Ambulatory Visit: Payer: Self-pay | Admitting: Primary Care

## 2017-01-24 DIAGNOSIS — M5442 Lumbago with sciatica, left side: Principal | ICD-10-CM

## 2017-01-24 DIAGNOSIS — G8929 Other chronic pain: Secondary | ICD-10-CM

## 2017-01-24 NOTE — Telephone Encounter (Signed)
Copied from [place] #[id number]. Topic: Quick Communication - See Telephone Encounter >> Jan 24, 2017  9:02 AM Robina Ade, Helene Kelp D wrote: CRM for notification. See Telephone encounter for: 01/24/17. Patient called and wanted to let provider know that the medication Diclofenac she gave her 2 weeks ago is working only if she takes 2 pills a day. She would like refill on this medication. She also told her to talk over the counter Flonase. Patient says it is helping but only if she does 2 spray on each nostril. Sh would like to talk to nurse or provider about this. And she would also like a call back to let her know if she can have the medication refill.

## 2017-01-24 NOTE — Telephone Encounter (Signed)
Phone call to pt.  Requested a refill on the Diclofenac.  Stated she was to report back to Allie Bossier re: how effective it was.  Reported she was taking Diclofenac 1 tab daily, because she wanted to see if less would be enough for her pain. Stated that the pain is better, but the leg is still weak.  Reported she will start taking this twice/ day, as was ordered.  Also, wanted to let Allie Bossier know that she is using Flonase, over the counter, but it only works if she takes 2 sprays per nostril, so she has been taking 2 sprays in one nostril in AM, and 2 sprays in the other nostril in the PM.  Is asking if this is okay, as she is concerned she may not be using it correctly.    Please refill Diclofenac per Tawni Millers recommendation, and advise pt. with any change in recommendations  Thank you.

## 2017-01-25 MED ORDER — DICLOFENAC SODIUM 75 MG PO TBEC: 75.0000 mg | DELAYED_RELEASE_TABLET | Freq: Two times a day (BID) | ORAL | 0 refills | Status: DC

## 2017-01-25 NOTE — Telephone Encounter (Signed)
Okay to use 4 sprays of flonase total in a day, okay to continue as is.   rx sent for diclofenac.  Thanks.

## 2017-01-25 NOTE — Telephone Encounter (Signed)
Patient notified as instructed by telephone and verbalized understanding. 

## 2017-02-22 ENCOUNTER — Encounter: Payer: Self-pay | Admitting: Internal Medicine

## 2017-02-22 ENCOUNTER — Ambulatory Visit: Payer: Medicare HMO | Admitting: Internal Medicine

## 2017-02-22 VITALS — BP 122/78 | HR 86 | Temp 97.8°F | Wt 133.0 lb

## 2017-02-22 DIAGNOSIS — B9789 Other viral agents as the cause of diseases classified elsewhere: Secondary | ICD-10-CM

## 2017-02-22 DIAGNOSIS — J069 Acute upper respiratory infection, unspecified: Secondary | ICD-10-CM | POA: Diagnosis not present

## 2017-02-22 NOTE — Progress Notes (Signed)
HPI  Pt presents to the clinic today with c/o cough, chest congestion and shortness of breath. She reports this started 2 weeks ago. The cough is productive of clear/yellow mucous. She denies chest pain. She denies fever, chills or body aches but has been fatigued. She has tried her prescribed inhalers, antihistamine, Mucinex and Dymista with good relief .She does have a history of allergies and asthma. She has had sick contacts.  Review of Systems      Past Medical History:  Diagnosis Date  . Arthritis   . Asthma   . Cancer of breast ([place])    33 years ago, left,   . Hearing loss    both ears, wwears hearing aides  . Hyperlipidemia   . Macular degeneration   . Osteopenia     Family History  Problem Relation Age of Onset  . Macular degeneration Mother   . Heart attack Father     Social History   Socioeconomic History  . Marital status: Married    Spouse name: Not on file  . Number of children: Not on file  . Years of education: Not on file  . Highest education level: Not on file  Social Needs  . Financial resource strain: Not on file  . Food insecurity - worry: Not on file  . Food insecurity - inability: Not on file  . Transportation needs - medical: Not on file  . Transportation needs - non-medical: Not on file  Occupational History  . Occupation: retired  Tobacco Use  . Smoking status: Never Smoker  . Smokeless tobacco: Never Used  Substance and Sexual Activity  . Alcohol use: Yes    Comment: glass wine 1x month  . Drug use: No  . Sexual activity: Not on file  Other Topics Concern  . Not on file  Social History Narrative   Married.   2 children, 6 grandchildren.   Retired. Previously was Pharmacist, hospital.    Enjoys participating in church.     Allergies  Allergen Reactions  . Fosamax [Alendronate Sodium] Nausea Only  . Sulfonamide Derivatives Rash     Constitutional: Positive fatigue. Denies headache, fever or abrupt weight changes.  HEENT:  Denies eye  redness, eye pain, pressure behind the eyes, facial pain, nasal congestion, ear pain, ringing in the ears, wax buildup, runny nose or sore throat. Respiratory: Positive cough and shortness of breath. Denies difficulty breathing.  Cardiovascular: Denies chest pain, chest tightness, palpitations or swelling in the hands or feet.   No other specific complaints in a complete review of systems (except as listed in HPI above).  Objective:   BP 122/78   Pulse 86   Temp 97.8 F (36.6 C) (Oral)   Wt 133 lb (60.3 kg)   SpO2 96%   BMI 25.97 kg/m   Wt Readings from Last 3 Encounters:  02/22/17 133 lb (60.3 kg)  01/06/17 133 lb 6.4 oz (60.5 kg)  12/05/16 133 lb 12.8 oz (60.7 kg)     General: Appears her stated age, in NAD. HEENT:  Throat/Mouth: Teeth present, mucosa pink and moist, no exudate noted, no lesions or ulcerations noted.  Neck: No cervical lymphadenopathy.  Pulmonary/Chest: Normal effort and positive vesicular breath sounds. No respiratory distress. No wheezes, rales or ronchi noted.       Assessment & Plan:   Viral URI with Cough:  No indication for Prednisone or abx at this time Continue Xyzal, Singulair, Dymista and Symbicort  RTC as needed or if symptoms persist.  Webb Silversmith, NP

## 2017-02-22 NOTE — Patient Instructions (Signed)

## 2017-02-27 ENCOUNTER — Other Ambulatory Visit: Payer: Self-pay | Admitting: Family Medicine

## 2017-02-27 DIAGNOSIS — M5442 Lumbago with sciatica, left side: Principal | ICD-10-CM

## 2017-02-27 DIAGNOSIS — G8929 Other chronic pain: Secondary | ICD-10-CM

## 2017-02-27 NOTE — Telephone Encounter (Signed)
How's she doing on the diclofenac? Is she wanting refills or is this an autofill?

## 2017-02-27 NOTE — Telephone Encounter (Signed)
Ok to refill? Electronically refill request   diclofenac (VOLTAREN) 75 MG EC tablet  Last prescribed on 01/25/2017. Last seen on 01/06/2017

## 2017-02-28 NOTE — Telephone Encounter (Signed)
Spoken to patient and she stated that diclofenac is helping. Patient request a refill.

## 2017-02-28 NOTE — Telephone Encounter (Signed)
Noted, refill sent to pharmacy. 

## 2017-03-31 ENCOUNTER — Other Ambulatory Visit: Payer: Self-pay | Admitting: Primary Care

## 2017-03-31 DIAGNOSIS — M5442 Lumbago with sciatica, left side: Principal | ICD-10-CM

## 2017-03-31 DIAGNOSIS — G8929 Other chronic pain: Secondary | ICD-10-CM

## 2017-04-25 ENCOUNTER — Other Ambulatory Visit: Payer: Self-pay | Admitting: Primary Care

## 2017-04-25 DIAGNOSIS — M8580 Other specified disorders of bone density and structure, unspecified site: Secondary | ICD-10-CM

## 2017-04-25 DIAGNOSIS — J4540 Moderate persistent asthma, uncomplicated: Secondary | ICD-10-CM

## 2017-04-25 DIAGNOSIS — M5442 Lumbago with sciatica, left side: Principal | ICD-10-CM

## 2017-04-25 DIAGNOSIS — R053 Chronic cough: Secondary | ICD-10-CM

## 2017-04-25 DIAGNOSIS — R05 Cough: Secondary | ICD-10-CM

## 2017-04-25 DIAGNOSIS — G8929 Other chronic pain: Secondary | ICD-10-CM

## 2017-04-25 DIAGNOSIS — Z853 Personal history of malignant neoplasm of breast: Secondary | ICD-10-CM

## 2017-04-25 MED ORDER — LEVOCETIRIZINE DIHYDROCHLORIDE 5 MG PO TABS: 5.0000 mg | ORAL_TABLET | Freq: Every evening | ORAL | 1 refills | Status: DC

## 2017-04-25 MED ORDER — DICLOFENAC SODIUM 75 MG PO TBEC: 75.0000 mg | DELAYED_RELEASE_TABLET | Freq: Two times a day (BID) | ORAL | 0 refills | Status: DC

## 2017-04-25 MED ORDER — BUDESONIDE-FORMOTEROL FUMARATE 160-4.5 MCG/ACT IN AERO: 2.0000 | INHALATION_SPRAY | Freq: Two times a day (BID) | RESPIRATORY_TRACT | 5 refills | Status: DC

## 2017-04-25 MED ORDER — RALOXIFENE HCL 60 MG PO TABS: 60.0000 mg | ORAL_TABLET | Freq: Every day | ORAL | 1 refills | Status: DC

## 2017-05-26 ENCOUNTER — Other Ambulatory Visit: Payer: Self-pay | Admitting: Primary Care

## 2017-05-26 DIAGNOSIS — E785 Hyperlipidemia, unspecified: Secondary | ICD-10-CM

## 2017-05-26 DIAGNOSIS — M8580 Other specified disorders of bone density and structure, unspecified site: Secondary | ICD-10-CM

## 2017-05-29 ENCOUNTER — Ambulatory Visit (INDEPENDENT_AMBULATORY_CARE_PROVIDER_SITE_OTHER): Payer: Medicare HMO | Admitting: Internal Medicine

## 2017-05-29 ENCOUNTER — Encounter: Payer: Self-pay | Admitting: Internal Medicine

## 2017-05-29 VITALS — BP 124/76 | HR 101 | Temp 97.8°F | Wt 134.0 lb

## 2017-05-29 DIAGNOSIS — M5441 Lumbago with sciatica, right side: Secondary | ICD-10-CM

## 2017-05-29 MED ORDER — PREDNISONE 10 MG PO TABS: ORAL_TABLET | ORAL | 0 refills | Status: DC

## 2017-05-29 NOTE — Patient Instructions (Signed)
Sciatica Sciatica is pain, numbness, weakness, or tingling along your sciatic nerve. The sciatic nerve starts in the lower back and goes down the back of each leg. Sciatica happens when this nerve is pinched or has pressure put on it. Sciatica usually goes away on its own or with treatment. Sometimes, sciatica may keep coming back (recur). Follow these instructions at home: Medicines  Take over-the-counter and prescription medicines only as told by your doctor.  Do not drive or use heavy machinery while taking prescription pain medicine. Managing pain  If directed, put ice on the affected area. ? Put ice in a plastic bag. ? Place a towel between your skin and the bag. ? Leave the ice on for 20 minutes, 2-3 times a day.  After icing, apply heat to the affected area before you exercise or as often as told by your doctor. Use the heat source that your doctor tells you to use, such as a moist heat pack or a heating pad. ? Place a towel between your skin and the heat source. ? Leave the heat on for 20-30 minutes. ? Remove the heat if your skin turns bright red. This is especially important if you are unable to feel pain, heat, or cold. You may have a greater risk of getting burned. Activity  Return to your normal activities as told by your doctor. Ask your doctor what activities are safe for you. ? Avoid activities that make your sciatica worse.  Take short rests during the day. Rest in a lying or standing position. This is usually better than sitting to rest. ? When you rest for a long time, do some physical activity or stretching between periods of rest. ? Avoid sitting for a long time without moving. Get up and move around at least one time each hour.  Exercise and stretch regularly, as told by your doctor.  Do not lift anything that is heavier than 10 lb (4.5 kg) while you have symptoms of sciatica. ? Avoid lifting heavy things even when you do not have symptoms. ? Avoid lifting heavy  things over and over.  When you lift objects, always lift in a way that is safe for your body. To do this, you should: ? Bend your knees. ? Keep the object close to your body. ? Avoid twisting. General instructions  Use good posture. ? Avoid leaning forward when you are sitting. ? Avoid hunching over when you are standing.  Stay at a healthy weight.  Wear comfortable shoes that support your feet. Avoid wearing high heels.  Avoid sleeping on a mattress that is too soft or too hard. You might have less pain if you sleep on a mattress that is firm enough to support your back.  Keep all follow-up visits as told by your doctor. This is important. Contact a doctor if:  You have pain that: ? Wakes you up when you are sleeping. ? Gets worse when you lie down. ? Is worse than the pain you have had in the past. ? Lasts longer than 4 weeks.  You lose weight for without trying. Get help right away if:  You cannot control when you pee (urinate) or poop (have a bowel movement).  You have weakness in any of these areas and it gets worse. ? Lower back. ? Lower belly (pelvis). ? Butt (buttocks). ? Legs.  You have redness or swelling of your back.  You have a burning feeling when you pee. This information is not intended to replace   advice given to you by your health care provider. Make sure you discuss any questions you have with your health care provider. Document Released: 12/22/2007 Document Revised: 08/20/2015 Document Reviewed: 11/21/2014 Elsevier Interactive Patient Education  2018 [employer]vier Inc.  

## 2017-05-29 NOTE — Progress Notes (Signed)
Subjective:    Patient ID: [name], female    DOB: [birth year], 77 y.o.   MRN: [id number]  HPI  Pt presents to the clinic today with c/o low back pain. This started about 1 week ago. She describes the pain as achy. The pain radiates into her right lower leg. She denies numbness, tingling or weakness. She denies any injury to the area. She denies loss of bowel or bladder. 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 She is taking Voltaren 2 x day as prescribed with minimal relief.  Review of Systems      Past Medical History:  Diagnosis Date  . Arthritis   . Asthma   . Cancer of breast ([place])    33 years ago, left,   . Hearing loss    both ears, wwears hearing aides  . Hyperlipidemia   . Macular degeneration   . Osteopenia     Current Outpatient Medications  Medication Sig Dispense Refill  . albuterol (PROVENTIL HFA;VENTOLIN HFA) 108 (90 Base) MCG/ACT inhaler Inhale 2 puffs into the lungs every 6 (six) hours as needed for wheezing or shortness of breath.    . budesonide-formoterol (SYMBICORT) 160-4.5 MCG/ACT inhaler Inhale 2 puffs into the lungs 2 (two) times daily. 10.2 Inhaler 5  . calcium carbonate (TUMS) 500 MG chewable tablet Chew 1,000 mg by mouth at bedtime.     . Cholecalciferol (VITAMIN D) 1000 UNITS capsule Take 1,000 Units by mouth daily with breakfast.     . diclofenac (VOLTAREN) 75 MG EC tablet Take 1 tablet (75 mg total) by mouth 2 (two) times daily. With food. 180 tablet 0  . DYMISTA 137-50 MCG/ACT SUSP USE 1 PUFF IN EACH NOSTRIL TWICE A DAY NASALLY  5  . levocetirizine (XYZAL) 5 MG tablet Take 1 tablet (5 mg total) by mouth every evening. 90 tablet 1  . montelukast (SINGULAIR) 10 MG tablet TAKE 1 TABLET BY MOUTH EVERYDAY AT BEDTIME 90 tablet 3  . Multiple Vitamins-Minerals (MULTI FOR HER 50+) TABS Take 1 tablet by mouth daily.    . Multiple Vitamins-Minerals (PRESERVISION/LUTEIN) CAPS Take 1 capsule by mouth 2 (two) times daily.    Marland Kitchen  omeprazole (PRILOSEC) 20 MG capsule Take 1 capsule (20 mg total) by mouth daily. 90 capsule 0  . pravastatin (PRAVACHOL) 20 MG tablet Take 20 mg by mouth daily.    . raloxifene (EVISTA) 60 MG tablet Take 1 tablet (60 mg total) by mouth daily with breakfast. 90 tablet 1  . triamcinolone ointment (KENALOG) 0.5 % Apply 1 application topically 2 (two) times daily. 30 g 0   No current facility-administered medications for this visit.     Allergies  Allergen Reactions  . Fosamax [Alendronate Sodium] Nausea Only  . Sulfonamide Derivatives Rash    Family History  Problem Relation Age of Onset  . Macular degeneration Mother   . Heart attack Father     Social History   Socioeconomic History  . Marital status: Married    Spouse name: Not on file  . Number of children: Not on file  . Years of education: Not on file  . Highest education level: Not on file  Social Needs  . Financial resource strain: Not on file  . Food insecurity - worry: Not on file  . Food insecurity - inability: Not on file  . Transportation needs - medical: Not on file  . Transportation needs - non-medical: Not on file  Occupational  History  . Occupation: retired  Tobacco Use  . Smoking status: Never Smoker  . Smokeless tobacco: Never Used  Substance and Sexual Activity  . Alcohol use: Yes    Comment: glass wine 1x month  . Drug use: No  . Sexual activity: Not on file  Other Topics Concern  . Not on file  Social History Narrative   Married.   2 children, 6 grandchildren.   Retired. Previously was Pharmacist, hospital.    Enjoys participating in church.      Constitutional: Denies fever, malaise, fatigue, headache or abrupt weight changes.  Musculoskeletal: Pt reports low back pain. Denies decrease in range of motion, difficulty with gait,  or joint swelling.   Neurological: Denies dizziness, difficulty with memory, difficulty with speech or problems with balance and coordination.    No other specific complaints in  a complete review of systems (except as listed in HPI above).  Objective:   Physical Exam  BP 124/76   Pulse (!) 101   Temp 97.8 F (36.6 C) (Oral)   Wt 134 lb (60.8 kg)   SpO2 97%   BMI 26.17 kg/m  Wt Readings from Last 3 Encounters:  05/29/17 134 lb (60.8 kg)  02/22/17 133 lb (60.3 kg)  01/06/17 133 lb 6.4 oz (60.5 kg)    General: Appears her stated age, in NAD. Musculoskeletal: Normal flexion, extension of the lumbar spine. Bony tenderness noted over the lumbar spine. Strength 5/5 BLE.  Neurological: Alert and oriented. Positive SLR on the right. Sensation intact to BLE.   BMET    Component Value Date/Time   NA 143 04/28/2015 0753   K 4.0 04/28/2015 0753   CL 105 04/28/2015 0753   CO2 29 04/28/2015 0753   GLUCOSE 78 04/28/2015 0753   BUN 20 04/28/2015 0753   CREATININE 0.71 04/28/2015 0753   CALCIUM 9.9 04/28/2015 0753   GFRNONAA >60 08/31/2014 0528   GFRAA >60 08/31/2014 0528    Lipid Panel     Component Value Date/Time   CHOL 143 09/21/2016 0837   TRIG 184.0 (H) 09/21/2016 0837   HDL 37.20 (L) 09/21/2016 0837   CHOLHDL 4 09/21/2016 0837   VLDL 36.8 09/21/2016 0837   LDLCALC 69 09/21/2016 0837    CBC    Component Value Date/Time   WBC 18.0 cH (HH) 09/05/2014 1654   RBC 3.32 (L) 09/05/2014 1654   HGB 9.3 (L) 09/05/2014 1654   HCT 28.9 (L) 09/05/2014 1654   PLT 759.0 (H) 09/05/2014 1654   MCV 87.0 09/05/2014 1654   MCH 27.4 08/31/2014 0528   MCHC 32.2 09/05/2014 1654   RDW 16.5 (H) 09/05/2014 1654   LYMPHSABS 2.9 09/05/2014 1654   MONOABS 1.6 (H) 09/05/2014 1654   EOSABS 0.1 09/05/2014 1654   BASOSABS 0.1 09/05/2014 1654    Hgb A1C No results found for: HGBA1C          Assessment & Plan:   Low Back Pain with Right Side Sciatica:  Discussed about needing some updated imaging Hold Voltaren eRx for Pred Taper x 9 days Stretching exercises given She is contemplating seeing a chiropractor if this does not improve  Return precautions  discussed Webb Silversmith, NP

## 2017-05-31 ENCOUNTER — Ambulatory Visit: Payer: Self-pay

## 2017-05-31 ENCOUNTER — Ambulatory Visit (INDEPENDENT_AMBULATORY_CARE_PROVIDER_SITE_OTHER): Payer: Medicare HMO

## 2017-05-31 VITALS — BP 126/70 | HR 79 | Temp 97.9°F | Ht 59.5 in | Wt 131.5 lb

## 2017-05-31 DIAGNOSIS — M8580 Other specified disorders of bone density and structure, unspecified site: Secondary | ICD-10-CM

## 2017-05-31 DIAGNOSIS — Z0000 Encounter for general adult medical examination without abnormal findings: Secondary | ICD-10-CM

## 2017-05-31 DIAGNOSIS — E785 Hyperlipidemia, unspecified: Secondary | ICD-10-CM

## 2017-05-31 LAB — COMPREHENSIVE METABOLIC PANEL
ALBUMIN: 3.7 g/dL (ref 3.5–5.2)
ALK PHOS: 69 U/L (ref 39–117)
ALT: 28 U/L (ref 0–35)
AST: 21 U/L (ref 0–37)
BILIRUBIN TOTAL: 0.3 mg/dL (ref 0.2–1.2)
BUN: 21 mg/dL (ref 6–23)
CO2: 28 mEq/L (ref 19–32)
Calcium: 9.6 mg/dL (ref 8.4–10.5)
Chloride: 106 mEq/L (ref 96–112)
Creatinine, Ser: 0.63 mg/dL (ref 0.40–1.20)
GFR: 97.59 mL/min (ref >60.00)
GLUCOSE: 84 mg/dL (ref 70–99)
Potassium: 3.6 mEq/L (ref 3.5–5.1)
Sodium: 140 mEq/L (ref 135–145)
Total Protein: 7 g/dL (ref 6.0–8.3)

## 2017-05-31 LAB — LIPID PANEL
CHOLESTEROL: 187 mg/dL (ref 0–200)
HDL: 41.1 mg/dL (ref >39.00)
LDL Cholesterol: 112 mg/dL — ABNORMAL HIGH (ref 0–99)
NonHDL: 146.33
TRIGLYCERIDES: 172 mg/dL — AB (ref 0.0–149.0)
Total CHOL/HDL Ratio: 5
VLDL: 34.4 mg/dL (ref 0.0–40.0)

## 2017-05-31 LAB — VITAMIN D 25 HYDROXY (VIT D DEFICIENCY, FRACTURES): VITD: 40.43 ng/mL (ref 30.00–100.00)

## 2017-05-31 NOTE — Progress Notes (Signed)
Subjective:   [name] is a 77 y.o. female who presents for an Initial Medicare Annual Wellness Visit.  Review of Systems    N/A  Cardiac Risk Factors include: advanced age (>50men, >50 women);dyslipidemia     Objective:    Today's Vitals   05/31/17 1014  BP: 126/70  Pulse: 79  Temp: 97.9 F (36.6 C)  TempSrc: Oral  SpO2: 96%  Weight: 131 lb 8 oz (59.6 kg)  Height: 4' 11.5" (1.511 m)  PainSc: 5   PainLoc: Back   Body mass index is 26.12 kg/m.  Advanced Directives 05/31/2017 04/14/2015 08/27/2014 08/02/2014 08/02/2014 04/21/2014 04/07/2014  Does Patient Have a Medical Advance Directive? No No No No No No No  Does patient want to make changes to medical advance directive? Yes (MAU/Ambulatory/Procedural Areas - Information given) - - - - - -  Would patient like information on creating a medical advance directive? - - No - patient declined information - - No - patient declined information -    Current Medications (verified) Outpatient Encounter Medications as of 05/31/2017  Medication Sig  . albuterol (PROVENTIL HFA;VENTOLIN HFA) 108 (90 Base) MCG/ACT inhaler Inhale 2 puffs into the lungs every 6 (six) hours as needed for wheezing or shortness of breath.  . budesonide-formoterol (SYMBICORT) 160-4.5 MCG/ACT inhaler Inhale 2 puffs into the lungs 2 (two) times daily.  . calcium carbonate (TUMS) 500 MG chewable tablet Chew 1,000 mg by mouth at bedtime.   . Cholecalciferol (VITAMIN D) 1000 UNITS capsule Take 1,000 Units by mouth daily with breakfast.   . diclofenac (VOLTAREN) 75 MG EC tablet Take 1 tablet (75 mg total) by mouth 2 (two) times daily. With food.  Marland Kitchen levocetirizine (XYZAL) 5 MG tablet Take 1 tablet (5 mg total) by mouth every evening.  . montelukast (SINGULAIR) 10 MG tablet TAKE 1 TABLET BY MOUTH EVERYDAY AT BEDTIME  . Multiple Vitamins-Minerals (MULTI FOR HER 50+) TABS Take 1 tablet by mouth daily.  . Multiple Vitamins-Minerals (PRESERVISION/LUTEIN) CAPS Take 1 capsule by  mouth 2 (two) times daily.  . predniSONE (DELTASONE) 10 MG tablet Take 3 tabs on days 1-3, take 2 tabs on days 4-6, take 1 tab on days 7-9  . raloxifene (EVISTA) 60 MG tablet Take 1 tablet (60 mg total) by mouth daily with breakfast.  . triamcinolone ointment (KENALOG) 0.5 % Apply 1 application topically 2 (two) times daily. (Patient taking differently: Apply 1 application topically as needed. )  . [DISCONTINUED] DYMISTA 137-50 MCG/ACT SUSP USE 1 PUFF IN EACH NOSTRIL TWICE A DAY NASALLY  . [DISCONTINUED] omeprazole (PRILOSEC) 20 MG capsule Take 1 capsule (20 mg total) by mouth daily.  . [DISCONTINUED] pravastatin (PRAVACHOL) 20 MG tablet Take 20 mg by mouth daily.   No facility-administered encounter medications on file as of 05/31/2017.     Allergies (verified) Fosamax [alendronate sodium] and Sulfonamide derivatives   History: Past Medical History:  Diagnosis Date  . Arthritis   . Asthma   . Cancer of breast ([place])    33 years ago, left,   . Hearing loss    both ears, wwears hearing aides  . Hyperlipidemia   . Macular degeneration   . Osteopenia    Past Surgical History:  Procedure Laterality Date  . ABDOMINAL HYSTERECTOMY    . BREAST ENHANCEMENT SURGERY    . CESAREAN SECTION     x 2  . COLONOSCOPY WITH PROPOFOL N/A 04/21/2014   Procedure: COLONOSCOPY WITH PROPOFOL;  Surgeon: Garlan Fair,  MD;  Location: [facility];  Service: Endoscopy;  Laterality: N/A;  . EYE SURGERY Bilateral march 2015 and june 2015   lens for cataracts  . LEFT HEART CATHETERIZATION WITH CORONARY ANGIOGRAM N/A 01/24/2012   Procedure: LEFT HEART CATHETERIZATION WITH CORONARY ANGIOGRAM;  Surgeon: Sueanne Margarita, MD;  Location: [facility] [facility];  Service: Cardiovascular;  Laterality: N/A;  . MASTECTOMY  33 yrs ago   bilateral   Family History  Problem Relation Age of Onset  . Macular degeneration Mother   . Heart attack Father    Social History   Socioeconomic History  . Marital status: Married     Spouse name: None  . Number of children: None  . Years of education: None  . Highest education level: None  Social Needs  . Financial resource strain: None  . Food insecurity - worry: None  . Food insecurity - inability: None  . Transportation needs - medical: None  . Transportation needs - non-medical: None  Occupational History  . Occupation: retired  Tobacco Use  . Smoking status: Never Smoker  . Smokeless tobacco: Never Used  Substance and Sexual Activity  . Alcohol use: Yes    Comment: glass wine 1x month  . Drug use: No  . Sexual activity: None  Other Topics Concern  . None  Social History Narrative   Married.   2 children, 6 grandchildren.   Retired. Previously was Pharmacist, hospital.    Enjoys participating in church.     Tobacco Counseling Counseling given: No   Clinical Intake:  Pre-visit preparation completed: Yes  Pain : No/denies pain Pain Score: 5      Nutritional Status: BMI 25 -29 Overweight Nutritional Risks: None Diabetes: No  How often do you need to have someone help you when you read instructions, pamphlets, or other written materials from your doctor or pharmacy?: 1 - Never What is the last grade level you completed in school?: Masters degree  Interpreter Needed?: No  Comments: pt lives with spouse Information entered by :: LPinson, LPN   Activities of Daily Living In your present state of health, do you have any difficulty performing the following activities: 05/31/2017  Hearing? Y  Vision? N  Difficulty concentrating or making decisions? N  Walking or climbing stairs? Y  Dressing or bathing? N  Doing errands, shopping? N  Preparing Food and eating ? N  Using the Toilet? N  In the past six months, have you accidently leaked urine? Y  Do you have problems with loss of bowel control? N  Managing your Medications? N  Managing your Finances? N  Housekeeping or managing your Housekeeping? N  Some recent data might be hidden      Immunizations and Health Maintenance Immunization History  Administered Date(s) Administered  . Influenza Split 12/07/2011  . Influenza Whole 03/11/2010  . Influenza,inj,Quad PF,6+ Mos 12/26/2013, 12/02/2014, 01/05/2017  . Pneumococcal Conjugate-13 12/26/2013  . Pneumococcal Polysaccharide-23 03/28/2004  . Tdap 03/28/2009  . Zoster 03/28/2009   There are no preventive care reminders to display for this patient.  Patient Care Team: Pleas Koch, NP as PCP - General (Internal Medicine)  Indicate any recent Medical Services you may have received from other than [facility] providers in the past year (date may be approximate).     Assessment:   This is a routine wellness examination for Kip.  Hearing/Vision screen Hearing Screening Comments: Bilateral hearing aids Vision Screening Comments: Last vision exam in April 2018 with Dr. Edison Pace  Dietary issues  and exercise activities discussed: Current Exercise Habits: The patient does not participate in regular exercise at present, Exercise limited by: None identified  Goals    . Increase physical activity     When weather permits, I will resume walking for at least 30 minutes 5 days per week.       Depression Screen PHQ 2/9 Scores 05/31/2017 10/10/2016  PHQ - 2 Score 0 0  PHQ- 9 Score 0 -    Fall Risk Fall Risk  05/31/2017  Falls in the past year? No   Cognitive Function: MMSE - Mini Mental State Exam 05/31/2017  Orientation to time 5  Orientation to Place 5  Registration 3  Attention/ Calculation 0  Recall 3  Language- name 2 objects 0  Language- repeat 1  Language- follow 3 step command 3  Language- read & follow direction 0  Write a sentence 0  Copy design 0  Total score 20     PLEASE NOTE: A Mini-Cog screen was completed. Maximum score is 20. A value of 0 denotes this part of [place] MMSE was not completed or the patient failed this part of the Mini-Cog screening.   Mini-Cog Screening Orientation to Time - Max 5  pts Orientation to Place - Max 5 pts Registration - Max 3 pts Recall - Max 3 pts Language Repeat - Max 1 pts Language Follow 3 Step Command - Max 3 pts     Screening Tests Health Maintenance  Topic Date Due  . PNA vac Low Risk Adult (2 of 2 - PPSV23) 06/25/2017 (Originally 12/27/2014)  . TETANUS/TDAP  03/29/2019  . INFLUENZA VACCINE  Completed  . DEXA SCAN  Completed     Plan:     I have personally reviewed, addressed, and noted the following in the patient's chart:  A. Medical and social history B. Use of alcohol, tobacco or illicit drugs  C. Current medications and supplements D. Functional ability and status E.  Nutritional status F.  Physical activity G. Advance directives H. List of other physicians I.  Hospitalizations, surgeries, and ER visits in previous 12 months J.  [place] to include hearing, vision, cognitive, depression L. Referrals and appointments - none  In addition, I have reviewed and discussed with patient certain preventive protocols, quality metrics, and best practice recommendations. A written personalized care plan for preventive services as well as general preventive health recommendations were provided to patient.  See attached scanned questionnaire for additional information.   Signed,   Lindell Noe, MHA, BS, LPN Health Coach

## 2017-05-31 NOTE — Progress Notes (Signed)
PCP notes:   Health maintenance:  PPSV23 - PCP please address at next appt  Abnormal screenings:   None  Patient concerns:   Pt is still experiencing back pain. States Prednisone is not effective but will still complete medication as prescribed.  Nurse concerns:  None  Next PCP appt:   06/05/17 @ 1015

## 2017-05-31 NOTE — Patient Instructions (Addendum)
[name] , Thank you for taking time to come for your Medicare Wellness Visit. I appreciate your ongoing commitment to your health goals. Please review the following plan we discussed and let me know if I can assist you in the future.   These are the goals we discussed: Goals    . Increase physical activity     When weather permits, I will resume walking for at least 30 minutes 5 days per week.        This is a list of the screening recommended for you and due dates:  Health Maintenance  Topic Date Due  . Pneumonia vaccines (2 of 2 - PPSV23) 06/25/2017*  . Tetanus Vaccine  03/29/2019  . Flu Shot  Completed  . DEXA scan (bone density measurement)  Completed  *Topic was postponed. The date shown is not the original due date.   Preventive Care for Adults  A healthy lifestyle and preventive care can promote health and wellness. Preventive health guidelines for adults include the following key practices.  . A routine yearly physical is a good way to check with your health care provider about your health and preventive screening. It is a chance to share any concerns and updates on your health and to receive a thorough exam.  . Visit your dentist for a routine exam and preventive care every 6 months. Brush your teeth twice a day and floss once a day. Good oral hygiene prevents tooth decay and gum disease.  . The frequency of eye exams is based on your age, health, family medical history, use  of contact lenses, and other factors. Follow your health care provider's recommendations for frequency of eye exams.  . Eat a healthy diet. Foods like vegetables, fruits, whole grains, low-fat dairy products, and lean protein foods contain the nutrients you need without too many calories. Decrease your intake of foods high in solid fats, added sugars, and salt. Eat the right amount of calories for you. Get information about a proper diet from your health care provider, if necessary.  . Regular  physical exercise is one of the most important things you can do for your health. Most adults should get at least 150 minutes of moderate-intensity exercise (any activity that increases your heart rate and causes you to sweat) each week. In addition, most adults need muscle-strengthening exercises on 2 or more days a week.  Silver Sneakers may be a benefit available to you. To determine eligibility, you may visit the website: www.silversneakers.com or contact program at (469)088-5909 Mon-Fri between 8AM-8PM.   . Maintain a healthy weight. The body mass index (BMI) is a screening tool to identify possible weight problems. It provides an estimate of body fat based on height and weight. Your health care provider can find your BMI and can help you achieve or maintain a healthy weight.   For adults 20 years and older: ? A BMI below 18.5 is considered underweight. ? A BMI of 18.5 to 24.9 is normal. ? A BMI of 25 to 29.9 is considered overweight. ? A BMI of 30 and above is considered obese.   . Maintain normal blood lipids and cholesterol levels by exercising and minimizing your intake of saturated fat. Eat a balanced diet with plenty of fruit and vegetables. Blood tests for lipids and cholesterol should begin at age 69 and be repeated every 5 years. If your lipid or cholesterol levels are high, you are over 50, or you are at high risk for heart disease,  you may need your cholesterol levels checked more frequently. Ongoing high lipid and cholesterol levels should be treated with medicines if diet and exercise are not working.  . If you smoke, find out from your health care provider how to quit. If you do not use tobacco, please do not start.  . If you choose to drink alcohol, please do not consume more than 2 drinks per day. One drink is considered to be 12 ounces (355 mL) of beer, 5 ounces (148 mL) of wine, or 1.5 ounces (44 mL) of liquor.  . If you are 55-56 years old, ask your health care provider if  you should take aspirin to prevent strokes.  . Use sunscreen. Apply sunscreen liberally and repeatedly throughout the day. You should seek shade when your shadow is shorter than you. Protect yourself by wearing long sleeves, pants, a wide-brimmed hat, and sunglasses year round, whenever you are outdoors.  . Once a month, do a whole body skin exam, using a mirror to look at the skin on your back. Tell your health care provider of new moles, moles that have irregular borders, moles that are larger than a pencil eraser, or moles that have changed in shape or color.

## 2017-06-05 ENCOUNTER — Ambulatory Visit (INDEPENDENT_AMBULATORY_CARE_PROVIDER_SITE_OTHER): Payer: Medicare HMO | Admitting: Primary Care

## 2017-06-05 ENCOUNTER — Ambulatory Visit (INDEPENDENT_AMBULATORY_CARE_PROVIDER_SITE_OTHER)
Admission: RE | Admit: 2017-06-05 | Discharge: 2017-06-05 | Disposition: A | Discharge status: Home / Self Care | Payer: Medicare HMO | Source: Ambulatory Visit | Attending: Primary Care | Admitting: Primary Care

## 2017-06-05 ENCOUNTER — Other Ambulatory Visit: Payer: Self-pay | Admitting: Primary Care

## 2017-06-05 ENCOUNTER — Encounter: Payer: Self-pay | Admitting: Primary Care

## 2017-06-05 VITALS — BP 122/64 | HR 97 | Temp 98.0°F | Ht 59.5 in | Wt 131.5 lb

## 2017-06-05 DIAGNOSIS — Z0001 Encounter for general adult medical examination with abnormal findings: Secondary | ICD-10-CM | POA: Diagnosis not present

## 2017-06-05 DIAGNOSIS — J4540 Moderate persistent asthma, uncomplicated: Secondary | ICD-10-CM | POA: Diagnosis not present

## 2017-06-05 DIAGNOSIS — M5441 Lumbago with sciatica, right side: Secondary | ICD-10-CM

## 2017-06-05 DIAGNOSIS — E785 Hyperlipidemia, unspecified: Secondary | ICD-10-CM | POA: Diagnosis not present

## 2017-06-05 DIAGNOSIS — M8580 Other specified disorders of bone density and structure, unspecified site: Secondary | ICD-10-CM

## 2017-06-05 DIAGNOSIS — G8929 Other chronic pain: Secondary | ICD-10-CM

## 2017-06-05 DIAGNOSIS — Z1239 Encounter for other screening for malignant neoplasm of breast: Secondary | ICD-10-CM

## 2017-06-05 DIAGNOSIS — R05 Cough: Secondary | ICD-10-CM | POA: Diagnosis not present

## 2017-06-05 DIAGNOSIS — M545 Low back pain: Secondary | ICD-10-CM | POA: Diagnosis not present

## 2017-06-05 DIAGNOSIS — Z0000 Encounter for general adult medical examination without abnormal findings: Secondary | ICD-10-CM | POA: Insufficient documentation

## 2017-06-05 DIAGNOSIS — J180 Bronchopneumonia, unspecified organism: Secondary | ICD-10-CM

## 2017-06-05 DIAGNOSIS — R051 Acute cough: Secondary | ICD-10-CM | POA: Insufficient documentation

## 2017-06-05 DIAGNOSIS — R053 Chronic cough: Secondary | ICD-10-CM

## 2017-06-05 MED ORDER — AMOXICILLIN-POT CLAVULANATE 875-125 MG PO TABS: 1.0000 | ORAL_TABLET | Freq: Two times a day (BID) | ORAL | 0 refills | Status: DC

## 2017-06-05 MED ORDER — TRAMADOL HCL 50 MG PO TABS: 50.0000 mg | ORAL_TABLET | Freq: Two times a day (BID) | ORAL | 0 refills | Status: DC | PRN

## 2017-06-05 NOTE — Assessment & Plan Note (Addendum)
Compliant to her Symbicort twice daily, using albuterol inhaler twice weekly on average. Compliant to Singulair and Xyzal. Currently managed on prednisone for back pain, with improvement in URI symptoms.   Persistent cough x 3 years since pneumonia. She is complaint to inhalers, allergy meds, and was once treated with PPI without improvement. Given this, will send to pulmonology for further evaluation.   Chest xray pending to rule out pneumonia.

## 2017-06-05 NOTE — Progress Notes (Signed)
Subjective:    Patient ID: [name], female    DOB: 19-Aug-1940, 77 y.o.   MRN: [id number]  HPI  [name] is a 77 year old female who presents today for complete physical, chief complaint of persistent cough, acute on chronic back pain, and MWV Part 2. She saw our health advisor last week.  Chronic cough x 3 years since pneumonia diagnosis, no improvement with PPI treatment, allergy medication, inhaler compliance. Since placed on prednisone for her back she's noticed an improvement and is expelling thick yellow mucous.   Little to no improvement on prednisone course for her right lower back with sciatica. No improvement with diclofenac. She's had no recent imaging, never evaluated by PT.   Immunizations: -Tetanus: Completed in 2011 -Influenza: Completed this season -Pneumonia: Prevnar in 2015, Pneumovax in 2006 -Shingles: Zostavax in 2011  Eye exam: Due in April 2019 Dental exam: Completes annually  Colonoscopy: Completed in 2016 Dexa: Completed in 2018 Mammogram: Completed several years ago    Review of Systems  Constitutional: Negative for fatigue and unexpected weight change.  HENT: Negative for rhinorrhea.   Respiratory: Positive for cough and wheezing. Negative for shortness of breath.        Productive with yellow sputum  Cardiovascular: Negative for chest pain.  Gastrointestinal: Negative for constipation and diarrhea.  Genitourinary: Negative for difficulty urinating and menstrual problem.  Musculoskeletal: Positive for back pain. Negative for arthralgias and myalgias.  Skin: Negative for rash.  Allergic/Immunologic: Negative for environmental allergies.  Neurological: Negative for dizziness, numbness and headaches.  Psychiatric/Behavioral: The patient is not nervous/anxious.        Past Medical History:  Diagnosis Date  . Arthritis   . Asthma   . Cancer of breast ([place])    33 years ago, left,   . Hearing loss    both ears, wwears hearing aides  .  Hyperlipidemia   . Macular degeneration   . Osteopenia      Social History   Socioeconomic History  . Marital status: Married    Spouse name: Not on file  . Number of children: Not on file  . Years of education: Not on file  . Highest education level: Not on file  Social Needs  . Financial resource strain: Not on file  . Food insecurity - worry: Not on file  . Food insecurity - inability: Not on file  . Transportation needs - medical: Not on file  . Transportation needs - non-medical: Not on file  Occupational History  . Occupation: retired  Tobacco Use  . Smoking status: Never Smoker  . Smokeless tobacco: Never Used  Substance and Sexual Activity  . Alcohol use: Yes    Comment: glass wine 1x month  . Drug use: No  . Sexual activity: Not on file  Other Topics Concern  . Not on file  Social History Narrative   Married.   2 children, 6 grandchildren.   Retired. Previously was Pharmacist, hospital.    Enjoys participating in church.     Past Surgical History:  Procedure Laterality Date  . ABDOMINAL HYSTERECTOMY    . BREAST ENHANCEMENT SURGERY    . CESAREAN SECTION     x 2  . COLONOSCOPY WITH PROPOFOL N/A 04/21/2014   Procedure: COLONOSCOPY WITH PROPOFOL;  Surgeon: Garlan Fair, MD;  Location: [facility];  Service: Endoscopy;  Laterality: N/A;  . EYE SURGERY Bilateral march 2015 and june 2015   lens for cataracts  . LEFT HEART CATHETERIZATION  WITH CORONARY ANGIOGRAM N/A 01/24/2012   Procedure: LEFT HEART CATHETERIZATION WITH CORONARY ANGIOGRAM;  Surgeon: Sueanne Margarita, MD;  Location: [place] CATH LAB;  Service: Cardiovascular;  Laterality: N/A;  . MASTECTOMY  33 yrs ago   bilateral    Family History  Problem Relation Age of Onset  . Macular degeneration Mother   . Heart attack Father     Allergies  Allergen Reactions  . Fosamax [Alendronate Sodium] Nausea Only  . Sulfonamide Derivatives Rash    Current Outpatient Medications on File Prior to Visit  Medication Sig  Dispense Refill  . albuterol (PROVENTIL HFA;VENTOLIN HFA) 108 (90 Base) MCG/ACT inhaler Inhale 2 puffs into the lungs every 6 (six) hours as needed for wheezing or shortness of breath.    . budesonide-formoterol (SYMBICORT) 160-4.5 MCG/ACT inhaler Inhale 2 puffs into the lungs 2 (two) times daily. 10.2 Inhaler 5  . calcium carbonate (TUMS) 500 MG chewable tablet Chew 1,000 mg by mouth at bedtime.     . Cholecalciferol (VITAMIN D) 1000 UNITS capsule Take 1,000 Units by mouth daily with breakfast.     . diclofenac (VOLTAREN) 75 MG EC tablet Take 1 tablet (75 mg total) by mouth 2 (two) times daily. With food. 180 tablet 0  . fluticasone (FLONASE) 50 MCG/ACT nasal spray Place 1 spray into both nostrils 2 (two) times daily.    Marland Kitchen levocetirizine (XYZAL) 5 MG tablet Take 1 tablet (5 mg total) by mouth every evening. 90 tablet 1  . montelukast (SINGULAIR) 10 MG tablet TAKE 1 TABLET BY MOUTH EVERYDAY AT BEDTIME 90 tablet 3  . Multiple Vitamins-Minerals (MULTI FOR HER 50+) TABS Take 1 tablet by mouth daily.    . Multiple Vitamins-Minerals (PRESERVISION/LUTEIN) CAPS Take 1 capsule by mouth 2 (two) times daily.    . predniSONE (DELTASONE) 10 MG tablet Take 3 tabs on days 1-3, take 2 tabs on days 4-6, take 1 tab on days 7-9 18 tablet 0  . raloxifene (EVISTA) 60 MG tablet Take 1 tablet (60 mg total) by mouth daily with breakfast. 90 tablet 1  . triamcinolone ointment (KENALOG) 0.5 % Apply 1 application topically 2 (two) times daily. (Patient taking differently: Apply 1 application topically as needed. ) 30 g 0   No current facility-administered medications on file prior to visit.     BP 122/64   Pulse 97   Temp 98 F (36.7 C) (Oral)   Ht 4' 11.5" (1.511 m)   Wt 131 lb 8 oz (59.6 kg)   SpO2 96%   BMI 26.12 kg/m    Objective:   Physical Exam  Constitutional: She is oriented to person, place, and time. She appears well-nourished.  HENT:  Right Ear: Tympanic membrane and ear canal normal.  Left Ear:  Tympanic membrane and ear canal normal.  Nose: Nose normal.  Mouth/Throat: Oropharynx is clear and moist.  Eyes: Conjunctivae and EOM are normal. Pupils are equal, round, and reactive to light.  Neck: Neck supple. No thyromegaly present.  Cardiovascular: Normal rate and regular rhythm.  No murmur heard. Pulmonary/Chest: Effort normal. She has wheezes in the right upper field. She has no rales.  Mild wheezing to right upper field  Abdominal: Soft. Bowel sounds are normal. There is no tenderness.  Musculoskeletal:       Lumbar back: She exhibits pain. She exhibits normal range of motion, no bony tenderness and no spasm.       Back:  Lymphadenopathy:    She has no cervical adenopathy.  Neurological:  She is alert and oriented to person, place, and time. She has normal reflexes. No cranial nerve deficit.  Skin: Skin is warm and dry. No rash noted.  Psychiatric: She has a normal mood and affect.          Assessment & Plan:

## 2017-06-05 NOTE — Assessment & Plan Note (Signed)
Chronic for 3 years. Persists despite PPI treatment, allergy treatment, inhaler compliance. Check plain films today to rule out infection given productive cough.   Referral placed to pulmonology for further evaluation.

## 2017-06-05 NOTE — Assessment & Plan Note (Signed)
Bone density scan completed in 2018, stable on calcium and vitamin D. Continue same, repeat imaging in 2020.

## 2017-06-05 NOTE — Assessment & Plan Note (Addendum)
Continues, now with right lower extremity pain to her right lower back, calf, ankle. She saw one of our providers last week for sciatic pain to her right lower extremities. She felt no improvement from prednisone course, and has felt no improvement from diclofenac.   Check plain films today, then consider PT. Await results.  Exam stable. Rx for short term course of Tramadol provided given increased pain. Continue diclofenac for mild pain. Renal function unremarkable.

## 2017-06-05 NOTE — Patient Instructions (Addendum)
You will be contacted regarding your referral to pulmonology.  Please let us know if you have not been contacted within one week.   Call the [facility] to schedule your mammogram.  Complete xray(s) prior to leaving today. I will notify you of your results once received.  You may take the Tramadol as needed for severe pain, this is a temporary prescription. Use the diclofenac for mild-moderate pain.  Start exercising. You should be getting 150 minutes of exercise weekly.  Ensure to eat plenty of vegetables, fruit, whole grains, lean protein.  Ensure you are consuming 64 ounces of water daily.  Please update me if you have any problems.  It was a pleasure to see you today!

## 2017-06-05 NOTE — Assessment & Plan Note (Signed)
Stable on recent labs, continue to monitor.

## 2017-06-05 NOTE — Assessment & Plan Note (Signed)
Immunizations UTD. Mammogram pending, patient will schedule. History of breast cancer. Bone density UTD.  Colonoscopy UTD. Recommended regular exercise, healthy diet. Exam as noted in PE. Labs unremarkable. Follow up in 1 year.

## 2017-06-07 ENCOUNTER — Ambulatory Visit: Payer: Medicare HMO | Admitting: Pulmonary Disease

## 2017-06-07 ENCOUNTER — Encounter: Payer: Self-pay | Admitting: Pulmonary Disease

## 2017-06-07 VITALS — BP 122/68 | HR 89 | Ht 59.0 in | Wt 133.2 lb

## 2017-06-07 DIAGNOSIS — J309 Allergic rhinitis, unspecified: Secondary | ICD-10-CM

## 2017-06-07 DIAGNOSIS — J189 Pneumonia, unspecified organism: Secondary | ICD-10-CM

## 2017-06-07 DIAGNOSIS — J4551 Severe persistent asthma with (acute) exacerbation: Secondary | ICD-10-CM | POA: Diagnosis not present

## 2017-06-07 NOTE — Progress Notes (Signed)
[facility]  Chief Complaint  Patient presents with  . Follow-up    Pt was seen by Dr. Allie Bossier MD, she advised pt she has pneumonia on 06-05-2017. Pt has productive cough-yellow mucus, SOB with exertion.    Vital signs: BP 122/68 (BP Location: Left Arm, Cuff Size: Normal)   Pulse 89   Ht 4\' 11"  (1.499 m)   Wt 133 lb 3.2 oz (60.4 kg)   SpO2 97%   BMI 26.90 kg/m   History of Present Illness: [name] is a 77 y.o. female never smoker with asthma.  She had pneumonia with empyema in June 2016.  She has persistent cough with sputum since her episode of pneumonia in 2016.  She usually brings up clear sputum.  She was seen by PCP earlier this month for back pain.  Started on prednisone.  Didn't help back, but improved cough.  She started bringing up yellow sputum over past few days.  CXR from 06/05/17 showed patchy infiltrates and she was started on augmentin.  This has helped.  She uses symbicort bid, singulair, xyzal, and flonase.  Uses albuterol few times per week.  Physical Exam:  General - pleasant Eyes - pupils reactive, wears glasses ENT - no sinus tenderness, no oral exudate, no LAN, pale nasal mucosa Cardiac - regular, no murmur Chest - faint inspiratory squeaks Rt > Lt clear with coughing Abd - soft, non tender Ext - no edema Skin - no rashes Neuro - normal strength Psych - normal mood   Dg Chest 2 View  Result Date: 06/05/2017 CLINICAL DATA:  Productive cough over the last 4 days.  Asthma. EXAM: CHEST - 2 VIEW COMPARISON:  11/08/2016 FINDINGS: Heart size is normal. Chronic aortic atherosclerosis with unfolding. Background pattern of chronic lung disease with widespread pulmonary scarring. Chronic fissural thickening. More prominent patchy densities in the right mid lung and in the lingula consistent with mild bronchopneumonia. No dense consolidation or lobar collapse. No fluid in the posterior costophrenic angles. No significant  bone finding. IMPRESSION: Background pattern of chronic lung disease and aortic atherosclerosis. Superimpose patchy densities in both lower lungs consistent with mild bronchopneumonia. No advanced consolidation or lobar collapse. Electronically Signed   By: Nelson Chimes M.D.   On: 06/05/2017 15:04   Dg Lumbar Spine Complete  Result Date: 06/05/2017 CLINICAL DATA:  Low back pain worsening over the last week, extending to the right leg. EXAM: LUMBAR SPINE - COMPLETE 4+ VIEW COMPARISON:  10/31/2014 FINDINGS: Minimal thoracolumbar curvature. Chronic lower lumbar disc space narrowing, most pronounced at L3-4 and L5-S1, slightly progressive over time. Sacroiliac joints appear normal. Mild lower lumbar facet osteoarthritis. IMPRESSION: Progressive lower lumbar degenerative disc disease, most notable at L3-4 and L5-S1. Lower lumbar facet osteoarthritis. Findings could certainly relate to symptoms. Electronically Signed   By: Nelson Chimes M.D.   On: 06/05/2017 15:05     Assessment/Plan:  Recurrent pneumonia. - complete ABx - f/u CXR at next ROV  Allergic asthma and rhinitis. - hold off on additional prednisone for now - continue symbicort, singulair, xyzal, flonase and prn albuterol - will need to get CBC with diff and RAST with IgE at next visit to determine if she is a candidate for biologic agent to treat asthma    Patient Instructions  Follow up in 2 weeks with Dr. Halford Chessman or Nurse Practitioner  You will need a chest xray, CBC with differential, and RAST with IgE to be done at your  next visit    [name], MD [place] 06/07/2017, 9:47 AM Pager:  [phone]  Flow Sheet  Pulmonary tests: Spirometry 12/07/11>>1.31 (71%), FEV1% 66 Exhaled nitric oxide 12/07/11>>8 ppb (normal) PFT 12/05/12 >> FEV1 1.49 (82%), FEV1% 69, FEF 25-75% 1.09 (69%), TLC 4.38 (85%), DLCO 87%, borderline BD response Rt pleural fluid 08/08/14 >> glucose 59, protein <3, LDH 6800, WBC 16,640 (86%N),  cytology negative for malignancy Rt pleural fluid 08/28/14 >> glucose 59, LDH 178, protein 3.5, WBC 303 (68%L)  Chest imaging: CT chest 08/02/14 >> RML consolidation, small Rt pleural effusion CT chest 08/09/14 >> Rt lower lobe thin walled cavity, scattered GGO and interlobular septal thickening CT chest 09/18/14 >> mild Rt pleural thickening, small Rt pleural effusion, 4 mm nodule LUL, improved RML consolidation, mild RML BTX, RLL cavity 2.5 cm from 3.1 cm, mild BTX lingula CT chest 10/16/14 >> decreased RLL PNA w/o cavity CT chest 04/27/16 >> 4 mm LUL nodule stable, mild BTX and scarring b/l  Cardiac tests: Lt heart cath 01/24/12 >> Normal coronaries, LVEDP 8 mmHg, EF 55% Echo 08/03/14 >> EF 50 to 55%, mild MR  Past Medical History: She  has a past medical history of Arthritis, Asthma, Cancer of breast ([place]), Hearing loss, Hyperlipidemia, Macular degeneration, and Osteopenia.  Past Surgical History: She  has a past surgical history that includes Abdominal hysterectomy; Cesarean section; Breast enhancement surgery; left heart catheterization with coronary angiogram (N/A, 01/24/2012); Mastectomy (33 yrs ago); Eye surgery (Bilateral, march 2015 and june 2015); and Colonoscopy with propofol (N/A, 04/21/2014).  Family History: Her family history includes Heart attack in her father; Macular degeneration in her mother.  Social History: She  reports that  has never smoked. she has never used smokeless tobacco. She reports that she drinks alcohol. She reports that she does not use drugs.  Medications: Allergies as of 06/07/2017      Reactions   Fosamax [alendronate Sodium] Nausea Only   Sulfonamide Derivatives Rash      Medication List        Accurate as of 06/07/17  9:47 AM. Always use your most recent med list.          albuterol 108 (90 Base) MCG/ACT inhaler Commonly known as:  PROVENTIL HFA;VENTOLIN HFA Inhale 2 puffs into the lungs every 6 (six) hours as needed for wheezing or  shortness of breath.   amoxicillin-clavulanate 875-125 MG tablet Commonly known as:  AUGMENTIN Take 1 tablet by mouth 2 (two) times daily.   budesonide-formoterol 160-4.5 MCG/ACT inhaler Commonly known as:  SYMBICORT Inhale 2 puffs into the lungs 2 (two) times daily.   diclofenac 75 MG EC tablet Commonly known as:  VOLTAREN Take 1 tablet (75 mg total) by mouth 2 (two) times daily. With food.   fluticasone 50 MCG/ACT nasal spray Commonly known as:  FLONASE Place 1 spray into both nostrils 2 (two) times daily.   levocetirizine 5 MG tablet Commonly known as:  XYZAL Take 1 tablet (5 mg total) by mouth every evening.   montelukast 10 MG tablet Commonly known as:  SINGULAIR TAKE 1 TABLET BY MOUTH EVERYDAY AT BEDTIME   PRESERVISION/LUTEIN Caps Take 1 capsule by mouth 2 (two) times daily.   MULTI FOR HER 50+ Tabs Take 1 tablet by mouth daily.   raloxifene 60 MG tablet Commonly known as:  EVISTA Take 1 tablet (60 mg total) by mouth daily with breakfast.   traMADol 50 MG tablet Commonly known as:  ULTRAM Take 1 tablet (50 mg  total) by mouth every 12 (twelve) hours as needed for severe pain.   triamcinolone ointment 0.5 % Commonly known as:  KENALOG Apply 1 application topically 2 (two) times daily.   TUMS 500 MG chewable tablet Generic drug:  calcium carbonate Chew 1,000 mg by mouth at bedtime.   Vitamin D 1000 units capsule Take 1,000 Units by mouth daily with breakfast.

## 2017-06-07 NOTE — Patient Instructions (Signed)
Follow up in 2 weeks with Dr. Halford Chessman or Nurse Practitioner  You will need a chest xray, CBC with differential, and RAST with IgE to be done at your next visit

## 2017-06-09 ENCOUNTER — Telehealth: Payer: Self-pay | Admitting: Primary Care

## 2017-06-09 NOTE — Telephone Encounter (Signed)
Copied from [place]. Topic: Quick Communication - See Telephone Encounter >> Jun 09, 2017  9:54 AM Cleaster Corin, NT wrote: CRM for notification. See Telephone encounter for:   06/09/17.  Pt. Daughter calling wanted to speak with nurse or np concerning pts health. Daughter is on dpr daughter can be reached at [phone]

## 2017-06-09 NOTE — Telephone Encounter (Signed)
I called the pts daughter Margarita Grizzle and she stated she and her sister feel the pts cough is due to her diet.  States the pt eats bad and she feels a gluten free, healthy diet would help her as when she visits there and eats this way she is better.  Margarita Grizzle asked if this could be advised to the pt the next time she is here but do not mention she suggested this?  Message sent to [facility].

## 2017-06-09 NOTE — Telephone Encounter (Signed)
Noted, appreciate the information.  

## 2017-06-11 NOTE — Progress Notes (Signed)
I reviewed health advisor's note, was available for consultation, and agree with documentation and plan.  

## 2017-06-12 ENCOUNTER — Telehealth: Payer: Self-pay | Admitting: Primary Care

## 2017-06-12 DIAGNOSIS — G8929 Other chronic pain: Secondary | ICD-10-CM

## 2017-06-12 DIAGNOSIS — M5441 Lumbago with sciatica, right side: Principal | ICD-10-CM

## 2017-06-12 MED ORDER — TRAMADOL HCL 50 MG PO TABS: 50.0000 mg | ORAL_TABLET | Freq: Two times a day (BID) | ORAL | 0 refills | Status: DC | PRN

## 2017-06-12 NOTE — Telephone Encounter (Signed)
Of course, please notify patient that I sent a few more tablets to her pharmacy to get her through a few physical therapy sessions. She should still have a few tablets remaining.

## 2017-06-12 NOTE — Telephone Encounter (Signed)
Spoken and notified patient of Kate's comments. Patient verbalized understanding. 

## 2017-06-12 NOTE — Telephone Encounter (Signed)
Copied from [place]. Topic: Quick Communication - Rx Refill/Question >> Jun 12, 2017 11:39 AM Ether Griffins B wrote: Medication: tramadol   Pt starts PT tomorrow and is hoping to get a few more to help with pain until physical therapy starts helping and building her muscles back up   Preferred Pharmacy (with phone number or street name): [id number] - [place], [place]: Please be advised that RX refills may take up to 3 business days. We ask that you follow-up with your pharmacy.

## 2017-06-13 DIAGNOSIS — M5116 Intervertebral disc disorders with radiculopathy, lumbar region: Secondary | ICD-10-CM | POA: Diagnosis not present

## 2017-06-13 DIAGNOSIS — M545 Low back pain: Secondary | ICD-10-CM | POA: Diagnosis not present

## 2017-06-15 DIAGNOSIS — M5116 Intervertebral disc disorders with radiculopathy, lumbar region: Secondary | ICD-10-CM | POA: Diagnosis not present

## 2017-06-15 DIAGNOSIS — M545 Low back pain: Secondary | ICD-10-CM | POA: Diagnosis not present

## 2017-06-20 DIAGNOSIS — M545 Low back pain: Secondary | ICD-10-CM | POA: Diagnosis not present

## 2017-06-20 DIAGNOSIS — M5116 Intervertebral disc disorders with radiculopathy, lumbar region: Secondary | ICD-10-CM | POA: Diagnosis not present

## 2017-06-21 ENCOUNTER — Ambulatory Visit: Payer: Medicare HMO | Admitting: Adult Health

## 2017-06-21 ENCOUNTER — Encounter: Payer: Self-pay | Admitting: Adult Health

## 2017-06-21 ENCOUNTER — Other Ambulatory Visit (INDEPENDENT_AMBULATORY_CARE_PROVIDER_SITE_OTHER): Payer: Medicare HMO

## 2017-06-21 ENCOUNTER — Ambulatory Visit (INDEPENDENT_AMBULATORY_CARE_PROVIDER_SITE_OTHER)
Admission: RE | Admit: 2017-06-21 | Discharge: 2017-06-21 | Disposition: A | Discharge status: Home / Self Care | Payer: Medicare HMO | Source: Ambulatory Visit | Attending: Adult Health | Admitting: Adult Health

## 2017-06-21 VITALS — BP 124/60 | HR 89 | Ht 59.0 in | Wt 134.0 lb

## 2017-06-21 DIAGNOSIS — J189 Pneumonia, unspecified organism: Secondary | ICD-10-CM | POA: Diagnosis not present

## 2017-06-21 DIAGNOSIS — J4540 Moderate persistent asthma, uncomplicated: Secondary | ICD-10-CM

## 2017-06-21 DIAGNOSIS — J309 Allergic rhinitis, unspecified: Secondary | ICD-10-CM | POA: Diagnosis not present

## 2017-06-21 LAB — CBC WITH DIFFERENTIAL/PLATELET
BASOS PCT: 1 % (ref 0.0–3.0)
Basophils Absolute: 0.1 10*3/uL (ref 0.0–0.1)
EOS ABS: 0.2 10*3/uL (ref 0.0–0.7)
EOS PCT: 2.4 % (ref 0.0–5.0)
HEMATOCRIT: 37 % (ref 36.0–46.0)
Hemoglobin: 12.2 g/dL (ref 12.0–15.0)
LYMPHS PCT: 19.2 % (ref 12.0–46.0)
Lymphs Abs: 1.7 10*3/uL (ref 0.7–4.0)
MCHC: 33.1 g/dL (ref 30.0–36.0)
MCV: 90 fl (ref 78.0–100.0)
MONOS PCT: 11.8 % (ref 3.0–12.0)
Monocytes Absolute: 1 10*3/uL (ref 0.1–1.0)
NEUTROS ABS: 5.7 10*3/uL (ref 1.4–7.7)
Neutrophils Relative %: 65.6 % (ref 43.0–77.0)
PLATELETS: 300 10*3/uL (ref 150.0–400.0)
RBC: 4.11 Mil/uL (ref 3.87–5.11)
RDW: 13.9 % (ref 11.5–15.5)
WBC: 8.6 10*3/uL (ref 4.0–10.5)

## 2017-06-21 NOTE — Assessment & Plan Note (Signed)
Recent PNA clinically improving .  Hold on additional abx  Check cxr today .

## 2017-06-21 NOTE — Patient Instructions (Signed)
Chest xray today  Labs today .  Continue on current regimen Follow-up in 6-8 weeks with Dr. Halford Chessman with a pulmonary function test Please contact office for sooner follow up if symptoms do not improve or worsen or seek emergency care

## 2017-06-21 NOTE — Assessment & Plan Note (Signed)
Cont on current regimen  Check Ige Dewitt Rota

## 2017-06-21 NOTE — Progress Notes (Signed)
@Patient  ID: [name], female    DOB: 04/19/1940, 77 y.o.   MRN: [id number]  Chief Complaint  Patient presents with  . Follow-up    PNA     Referring provider: Pleas Koch, NP  HPI: 77 year old female never smoker followed for asthma Previous pneumonia with empyema June 2016  Pulmonary tests: Spirometry 12/07/11>>1.31 (71%), FEV1% 66 Exhaled nitric oxide 12/07/11>>8 ppb (normal) PFT 12/05/12 >> FEV1 1.49 (82%), FEV1% 69, FEF 25-75% 1.09 (69%), TLC 4.38 (85%), DLCO 87%, borderline BD response Rt pleural fluid 08/08/14 >> glucose 59, protein <3, LDH 6800, WBC 16,640 (86%N), cytology negative for malignancy Rt pleural fluid 08/28/14 >> glucose 59, LDH 178, protein 3.5, WBC 303 (68%L)  Chest imaging: CT chest 08/02/14 >> RML consolidation, small Rt pleural effusion CT chest 08/09/14 >> Rt lower lobe thin walled cavity, scattered GGO and interlobular septal thickening CT chest 09/18/14 >> mild Rt pleural thickening, small Rt pleural effusion, 4 mm nodule LUL, improved RML consolidation, mild RML BTX, RLL cavity 2.5 cm from 3.1 cm, mild BTX lingula CT chest 10/16/14 >> decreased RLL PNA w/o cavity CT chest 04/27/16 >> 4 mm LUL nodule stable, mild BTX and scarring b/l  Cardiac tests: Lt heart cath 01/24/12 >> Normal coronaries, LVEDP 8 mmHg, EF 55% Echo 08/03/14 >> EF 50 to 55%, mild MR  06/21/2017 Follow up ; Asthma and PNA  Patient returns for a 2-week follow-up.  Patient was seen last visit with pneumonia and asthma flare. She was treated with Augmentin.  Chest x-ray showed patchy densities in both lower lobes. Since last visit patient is feeling better.  During this time she was also treated with steroid taper . Says her cough got much better and actually went totally away for several days. Has noticed it is coming back some now , minimally productive . No fever, chest pain, orthopnea, edema or hemoptysis .   Allergies  Allergen Reactions  . Fosamax [Alendronate Sodium]  Nausea Only  . Sulfonamide Derivatives Rash    Immunization History  Administered Date(s) Administered  . Influenza Split 12/07/2011  . Influenza Whole 03/11/2010  . Influenza,inj,Quad PF,6+ Mos 12/26/2013, 12/02/2014, 01/05/2017  . Pneumococcal Conjugate-13 12/26/2013  . Pneumococcal Polysaccharide-23 03/28/2004  . Tdap 03/28/2009  . Zoster 03/28/2009    Past Medical History:  Diagnosis Date  . Arthritis   . Asthma   . Cancer of breast ([place])    33 years ago, left,   . Hearing loss    both ears, wwears hearing aides  . Hyperlipidemia   . Macular degeneration   . Osteopenia     Tobacco History: Social History   Tobacco Use  Smoking Status Never Smoker  Smokeless Tobacco Never Used   Counseling given: Not Answered   Outpatient Encounter Medications as of 06/21/2017  Medication Sig  . albuterol (PROVENTIL HFA;VENTOLIN HFA) 108 (90 Base) MCG/ACT inhaler Inhale 2 puffs into the lungs every 6 (six) hours as needed for wheezing or shortness of breath.  . budesonide-formoterol (SYMBICORT) 160-4.5 MCG/ACT inhaler Inhale 2 puffs into the lungs 2 (two) times daily.  . calcium carbonate (TUMS) 500 MG chewable tablet Chew 1,000 mg by mouth at bedtime.   . Cholecalciferol (VITAMIN D) 1000 UNITS capsule Take 1,000 Units by mouth daily with breakfast.   . diclofenac (VOLTAREN) 75 MG EC tablet Take 1 tablet (75 mg total) by mouth 2 (two) times daily. With food.  . fluticasone (FLONASE) 50 MCG/ACT nasal spray Place 1 spray into  both nostrils 2 (two) times daily.  Marland Kitchen levocetirizine (XYZAL) 5 MG tablet Take 1 tablet (5 mg total) by mouth every evening.  . montelukast (SINGULAIR) 10 MG tablet TAKE 1 TABLET BY MOUTH EVERYDAY AT BEDTIME  . Multiple Vitamins-Minerals (MULTI FOR HER 50+) TABS Take 1 tablet by mouth daily.  . Multiple Vitamins-Minerals (PRESERVISION/LUTEIN) CAPS Take 1 capsule by mouth 2 (two) times daily.  . raloxifene (EVISTA) 60 MG tablet Take 1 tablet (60 mg total) by mouth  daily with breakfast.  . traMADol (ULTRAM) 50 MG tablet Take 1 tablet (50 mg total) by mouth every 12 (twelve) hours as needed for severe pain.  Marland Kitchen triamcinolone ointment (KENALOG) 0.5 % Apply 1 application topically 2 (two) times daily. (Patient taking differently: Apply 1 application topically as needed. )  . [DISCONTINUED] amoxicillin-clavulanate (AUGMENTIN) 875-125 MG tablet Take 1 tablet by mouth 2 (two) times daily.   No facility-administered encounter medications on file as of 06/21/2017.      Review of Systems  Constitutional:   No  weight loss, night sweats,  Fevers, chills,  +fatigue, or  lassitude.  HEENT:   No headaches,  Difficulty swallowing,  Tooth/dental problems, or  Sore throat,                No sneezing, itching, ear ache,  +nasal congestion, post nasal drip,   CV:  No chest pain,  Orthopnea, PND, swelling in lower extremities, anasarca, dizziness, palpitations, syncope.   GI  No heartburn, indigestion, abdominal pain, nausea, vomiting, diarrhea, change in bowel habits, loss of appetite, bloody stools.   Resp:.  No chest wall deformity  Skin: no rash or lesions.  GU: no dysuria, change in color of urine, no urgency or frequency.  No flank pain, no hematuria   MS:  No joint pain or swelling.  No decreased range of motion.  No back pain.    Physical Exam  BP 124/60 (BP Location: Left Arm, Cuff Size: Normal)   Pulse 89   Ht 4\' 11"  (1.499 m)   Wt 134 lb (60.8 kg)   SpO2 97%   BMI 27.06 kg/m   GEN: A/Ox3; pleasant , NAD, elderly    HEENT:  [place]/AT,  EACs-clear, TMs-wnl, NOSE-clear, THROAT-clear, no lesions, no postnasal drip or exudate noted.   NECK:  Supple w/ fair ROM; no JVD; normal carotid impulses w/o bruits; no thyromegaly or nodules palpated; no lymphadenopathy.    RESP  Clear  P & A; w/o, wheezes/ rales/ or rhonchi. no accessory muscle use, no dullness to percussion  CARD:  RRR, no m/r/g, no peripheral edema, pulses intact, no cyanosis or  clubbing.  GI:   Soft & nt; nml bowel sounds; no organomegaly or masses detected.   Musco: Warm bil, no deformities or joint swelling noted.   Neuro: alert, no focal deficits noted.    Skin: Warm, no lesions or rashes    Lab Results:  CBC    Component Value Date/Time   WBC 8.6 06/21/2017 1003   RBC 4.11 06/21/2017 1003   HGB 12.2 06/21/2017 1003   HCT 37.0 06/21/2017 1003   PLT 300.0 06/21/2017 1003   MCV 90.0 06/21/2017 1003   MCH 27.4 08/31/2014 0528   MCHC 33.1 06/21/2017 1003   RDW 13.9 06/21/2017 1003   LYMPHSABS 1.7 06/21/2017 1003   MONOABS 1.0 06/21/2017 1003   EOSABS 0.2 06/21/2017 1003   BASOSABS 0.1 06/21/2017 1003    BMET    Component Value Date/Time   NA  140 05/31/2017 1009   K 3.6 05/31/2017 1009   CL 106 05/31/2017 1009   CO2 28 05/31/2017 1009   GLUCOSE 84 05/31/2017 1009   BUN 21 05/31/2017 1009   CREATININE 0.63 05/31/2017 1009   CALCIUM 9.6 05/31/2017 1009   GFRNONAA >60 08/31/2014 0528   GFRAA >60 08/31/2014 0528    BNP    Component Value Date/Time   BNP 519.1 (H) 08/05/2014 0007    ProBNP    Component Value Date/Time   PROBNP 133.0 (H) 09/05/2014 1654    Imaging: Dg Chest 2 View  Result Date: 06/21/2017 CLINICAL DATA:  Pneumonia. EXAM: CHEST - 2 VIEW COMPARISON:  Radiographs of June 05, 2017. FINDINGS: Stable cardiomediastinal silhouette. Atherosclerosis of thoracic aorta is noted. No pneumothorax or pleural effusion is noted. Stable bibasilar scarring is noted. Bony thorax is unremarkable. IMPRESSION: Stable bibasilar scarring. Aortic Atherosclerosis (ICD10-I70.0). Electronically Signed   By: Marijo Conception, M.D.   On: 06/21/2017 14:21   Dg Chest 2 View  Result Date: 06/05/2017 CLINICAL DATA:  Productive cough over the last 4 days.  Asthma. EXAM: CHEST - 2 VIEW COMPARISON:  11/08/2016 FINDINGS: Heart size is normal. Chronic aortic atherosclerosis with unfolding. Background pattern of chronic lung disease with widespread  pulmonary scarring. Chronic fissural thickening. More prominent patchy densities in the right mid lung and in the lingula consistent with mild bronchopneumonia. No dense consolidation or lobar collapse. No fluid in the posterior costophrenic angles. No significant bone finding. IMPRESSION: Background pattern of chronic lung disease and aortic atherosclerosis. Superimpose patchy densities in both lower lungs consistent with mild bronchopneumonia. No advanced consolidation or lobar collapse. Electronically Signed   By: Nelson Chimes M.D.   On: 06/05/2017 15:04   Dg Lumbar Spine Complete  Result Date: 06/05/2017 CLINICAL DATA:  Low back pain worsening over the last week, extending to the right leg. EXAM: LUMBAR SPINE - COMPLETE 4+ VIEW COMPARISON:  10/31/2014 FINDINGS: Minimal thoracolumbar curvature. Chronic lower lumbar disc space narrowing, most pronounced at L3-4 and L5-S1, slightly progressive over time. Sacroiliac joints appear normal. Mild lower lumbar facet osteoarthritis. IMPRESSION: Progressive lower lumbar degenerative disc disease, most notable at L3-4 and L5-S1. Lower lumbar facet osteoarthritis. Findings could certainly relate to symptoms. Electronically Signed   By: Nelson Chimes M.D.   On: 06/05/2017 15:05     Assessment & Plan:   Moderate persistent asthma Recent flare with PNA now improving . Pt with recurrent flare and ongoing cough despite maximum therapy.  Check Ige /RAST . May be candidate Biologic  Plan  Patient Instructions  Chest xray today  Labs today .  Continue on current regimen Follow-up in 6-8 weeks with Dr. Halford Chessman with a pulmonary function test Please contact office for sooner follow up if symptoms do not improve or worsen or seek emergency care        Allergic rhinitis Cont on current regimen  Check Ige /RAST   Pneumonia Recent PNA clinically improving .  Hold on additional abx  Check cxr today .      Rexene Edison, NP 06/21/2017

## 2017-06-21 NOTE — Assessment & Plan Note (Signed)
Recent flare with PNA now improving . Pt with recurrent flare and ongoing cough despite maximum therapy.  Check Ige /RAST . May be candidate Biologic  Plan  Patient Instructions  Chest xray today  Labs today .  Continue on current regimen Follow-up in 6-8 weeks with Dr. Halford Chessman with a pulmonary function test Please contact office for sooner follow up if symptoms do not improve or worsen or seek emergency care

## 2017-06-22 DIAGNOSIS — M5116 Intervertebral disc disorders with radiculopathy, lumbar region: Secondary | ICD-10-CM | POA: Diagnosis not present

## 2017-06-22 DIAGNOSIS — M545 Low back pain: Secondary | ICD-10-CM | POA: Diagnosis not present

## 2017-06-22 LAB — RESPIRATORY ALLERGY PROFILE REGION II ~~LOC~~
ALLERGEN, D PTERNOYSSINUS, D1: 2.64 kU/L — AB
Allergen, A. alternata, m6: <0.1 kU/L
Allergen, Comm Silver Birch, t9: <0.1 kU/L
Allergen, Cottonwood, t14: <0.1 kU/L
Allergen, Mouse Urine Protein, e78: <0.1 kU/L
Allergen, Oak,t7: <0.1 kU/L
Bermuda Grass: <0.1 kU/L
Box Elder IgE: <0.1 kU/L
CLADOSPORIUM HERBARUM (M2) IGE: <0.1 kU/L
CLASS: 0
CLASS: 0
CLASS: 0
CLASS: 0
CLASS: 0
CLASS: 0
CLASS: 0
CLASS: 0
CLASS: 0
CLASS: 0
CLASS: 0
CLASS: 0
CLASS: 0
CLASS: 2
CLASS: 2
COMMON RAGWEED (SHORT) (W1) IGE: <0.1 kU/L
Class: 0
Class: 0
Class: 0
Class: 0
Class: 0
Class: 0
Class: 0
Class: 0
Class: 0
Cockroach: <0.1 kU/L
D. FARINAE: 2.19 kU/L — AB
Elm IgE: <0.1 kU/L
IgE (Immunoglobulin E), Serum: 14 kU/L (ref <=114)
Johnson Grass: <0.1 kU/L
Pecan/Hickory Tree IgE: <0.1 kU/L
Sheep Sorrel IgE: <0.1 kU/L
Timothy Grass: <0.1 kU/L

## 2017-06-22 LAB — INTERPRETATION:

## 2017-06-22 NOTE — Progress Notes (Signed)
Called spoke with patient, advised of cxr results / recs as stated by TP.  Pt verbalized her understanding and denied any questions. 

## 2017-06-26 DIAGNOSIS — H353132 Nonexudative age-related macular degeneration, bilateral, intermediate dry stage: Secondary | ICD-10-CM | POA: Diagnosis not present

## 2017-06-27 NOTE — Progress Notes (Signed)
Called spoke with patient, advised of lab results / recs as stated by TP.  Pt verbalized her understanding and denied any questions. 

## 2017-06-29 DIAGNOSIS — M545 Low back pain: Secondary | ICD-10-CM | POA: Diagnosis not present

## 2017-06-29 DIAGNOSIS — M5116 Intervertebral disc disorders with radiculopathy, lumbar region: Secondary | ICD-10-CM | POA: Diagnosis not present

## 2017-08-02 DIAGNOSIS — H353132 Nonexudative age-related macular degeneration, bilateral, intermediate dry stage: Secondary | ICD-10-CM | POA: Diagnosis not present

## 2017-08-08 ENCOUNTER — Other Ambulatory Visit: Payer: Self-pay | Admitting: Primary Care

## 2017-08-08 ENCOUNTER — Encounter: Payer: Self-pay | Admitting: Primary Care

## 2017-08-08 ENCOUNTER — Ambulatory Visit: Payer: Self-pay

## 2017-08-08 ENCOUNTER — Ambulatory Visit (INDEPENDENT_AMBULATORY_CARE_PROVIDER_SITE_OTHER): Payer: Medicare HMO | Admitting: Primary Care

## 2017-08-08 VITALS — BP 116/70 | HR 91 | Temp 98.0°F | Ht 59.0 in | Wt 137.2 lb

## 2017-08-08 DIAGNOSIS — M5442 Lumbago with sciatica, left side: Principal | ICD-10-CM

## 2017-08-08 DIAGNOSIS — G8929 Other chronic pain: Secondary | ICD-10-CM

## 2017-08-08 DIAGNOSIS — J209 Acute bronchitis, unspecified: Secondary | ICD-10-CM | POA: Diagnosis not present

## 2017-08-08 MED ORDER — AZITHROMYCIN 250 MG PO TABS: ORAL_TABLET | ORAL | 0 refills | Status: DC

## 2017-08-08 MED ORDER — DICLOFENAC SODIUM 75 MG PO TBEC
DELAYED_RELEASE_TABLET | ORAL | 0 refills | Status: DC
Start: 2017-08-08 — End: 2017-11-04

## 2017-08-08 NOTE — Progress Notes (Signed)
Subjective:    Patient ID: [name], female    DOB: [birth year], 77 y.o.   MRN: [id number]  HPI  [name] is a 77 year old female with a history of asthma, allergic rhinitis, persistent cough who presents today with a chief complaint of cough. She'd also like a refill of her diclofenac.   Her symptoms began three weeks ago with cough. At that time her cough was productive with clear sputum, about 5 days ago she noticed a change in sputum color to light green. She also reports shortness of breath, chest congestion, wheezing. She denies fevers.   She's been using her Symbicort daily, has not used her albuterol inhaler. She's also compliant to her Singulair and Xyzal daily. Overall she's feeling no better.   Review of Systems  Constitutional: Positive for chills and fatigue. Negative for fever.  HENT: Positive for congestion. Negative for ear pain, sinus pressure and sore throat.   Respiratory: Positive for cough, shortness of breath and wheezing.        Past Medical History:  Diagnosis Date  . Arthritis   . Asthma   . Cancer of breast ([place])    33 years ago, left,   . Hearing loss    both ears, wwears hearing aides  . Hyperlipidemia   . Macular degeneration   . Osteopenia      Social History   Socioeconomic History  . Marital status: Married    Spouse name: Not on file  . Number of children: Not on file  . Years of education: Not on file  . Highest education level: Not on file  Occupational History  . Occupation: retired  Scientific laboratory technician  . Financial resource strain: Not on file  . Food insecurity:    Worry: Not on file    Inability: Not on file  . Transportation needs:    Medical: Not on file    Non-medical: Not on file  Tobacco Use  . Smoking status: Never Smoker  . Smokeless tobacco: Never Used  Substance and Sexual Activity  . Alcohol use: Yes    Comment: glass wine 1x month  . Drug use: No  . Sexual activity: Not on file  Lifestyle  . Physical  activity:    Days per week: Not on file    Minutes per session: Not on file  . Stress: Not on file  Relationships  . Social connections:    Talks on phone: Not on file    Gets together: Not on file    Attends religious service: Not on file    Active member of club or organization: Not on file    Attends meetings of clubs or organizations: Not on file    Relationship status: Not on file  . Intimate partner violence:    Fear of current or ex partner: Not on file    Emotionally abused: Not on file    Physically abused: Not on file    Forced sexual activity: Not on file  Other Topics Concern  . Not on file  Social History Narrative   Married.   2 children, 6 grandchildren.   Retired. Previously was Pharmacist, hospital.    Enjoys participating in church.     Past Surgical History:  Procedure Laterality Date  . ABDOMINAL HYSTERECTOMY    . BREAST ENHANCEMENT SURGERY    . CESAREAN SECTION     x 2  . COLONOSCOPY WITH PROPOFOL N/A 04/21/2014   Procedure: COLONOSCOPY WITH PROPOFOL;  Surgeon: Garlan Fair, MD;  Location: [facility];  Service: Endoscopy;  Laterality: N/A;  . EYE SURGERY Bilateral march 2015 and june 2015   lens for cataracts  . LEFT HEART CATHETERIZATION WITH CORONARY ANGIOGRAM N/A 01/24/2012   Procedure: LEFT HEART CATHETERIZATION WITH CORONARY ANGIOGRAM;  Surgeon: Sueanne Margarita, MD;  Location: [facility];  Service: Cardiovascular;  Laterality: N/A;  . MASTECTOMY  33 yrs ago   bilateral    Family History  Problem Relation Age of Onset  . Macular degeneration Mother   . Heart attack Father     Allergies  Allergen Reactions  . Fosamax [Alendronate Sodium] Nausea Only  . Sulfonamide Derivatives Rash    Current Outpatient Medications on File Prior to Visit  Medication Sig Dispense Refill  . albuterol (PROVENTIL HFA;VENTOLIN HFA) 108 (90 Base) MCG/ACT inhaler Inhale 2 puffs into the lungs every 6 (six) hours as needed for wheezing or shortness of breath.    .  budesonide-formoterol (SYMBICORT) 160-4.5 MCG/ACT inhaler Inhale 2 puffs into the lungs 2 (two) times daily. 10.2 Inhaler 5  . calcium carbonate (TUMS) 500 MG chewable tablet Chew 1,000 mg by mouth at bedtime.     . Cholecalciferol (VITAMIN D) 1000 UNITS capsule Take 1,000 Units by mouth daily with breakfast.     . diclofenac (VOLTAREN) 75 MG EC tablet Take 1 tablet (75 mg total) by mouth 2 (two) times daily. With food. 180 tablet 0  . fluticasone (FLONASE) 50 MCG/ACT nasal spray Place 1 spray into both nostrils 2 (two) times daily.    Marland Kitchen levocetirizine (XYZAL) 5 MG tablet Take 1 tablet (5 mg total) by mouth every evening. 90 tablet 1  . montelukast (SINGULAIR) 10 MG tablet TAKE 1 TABLET BY MOUTH EVERYDAY AT BEDTIME 90 tablet 3  . Multiple Vitamins-Minerals (MULTI FOR HER 50+) TABS Take 1 tablet by mouth daily.    . Multiple Vitamins-Minerals (PRESERVISION/LUTEIN) CAPS Take 1 capsule by mouth 2 (two) times daily.    . raloxifene (EVISTA) 60 MG tablet Take 1 tablet (60 mg total) by mouth daily with breakfast. 90 tablet 1  . triamcinolone ointment (KENALOG) 0.5 % Apply 1 application topically 2 (two) times daily. (Patient taking differently: Apply 1 application topically as needed. ) 30 g 0   No current facility-administered medications on file prior to visit.     BP 116/70   Pulse 91   Temp 98 F (36.7 C) (Oral)   Ht 4\' 11"  (1.499 m)   Wt 137 lb 4 oz (62.3 kg)   SpO2 97%   BMI 27.72 kg/m    Objective:   Physical Exam  Constitutional: She appears well-nourished.  HENT:  Right Ear: Tympanic membrane and ear canal normal.  Left Ear: Tympanic membrane and ear canal normal.  Nose: Right sinus exhibits no maxillary sinus tenderness and no frontal sinus tenderness. Left sinus exhibits no maxillary sinus tenderness and no frontal sinus tenderness.  Mouth/Throat: Oropharynx is clear and moist.  Eyes: Conjunctivae are normal.  Neck: Neck supple.  Cardiovascular: Normal rate and regular  rhythm.  Pulmonary/Chest: Effort normal and breath sounds normal. She has no wheezes. She has no rales.  Congested, deep cough noted during exam  Lymphadenopathy:    She has no cervical adenopathy.  Skin: Skin is warm and dry.          Assessment & Plan:  URI vs Bronchitis:  Cough, congestion, SOB, wheezing x 3 weeks, no improvement. Exam today overall unremarkable, no wheezing or  rhonchi noted. She does have a deep, congested cough during exam. Given duration of symptoms coupled with presence of purulent sputum and fatigue, will treat for presumed bacterial involvement. Rx for Azithromycin course sent to pharmacy. Delsym OTC PRN. Discussed to use the albuterol inhaler PRN. Continue Symbicort and other medications as prescribed.   Pleas Koch, NP

## 2017-08-08 NOTE — Assessment & Plan Note (Signed)
Significant improvement with PT, no longer taking Tramadol. Discussed to use diclofenac only as needed for pain as she was taking this twice daily regardless. Refill sent to pharmacy.

## 2017-08-08 NOTE — Telephone Encounter (Signed)
Pt. Has a history of asthma and reports she started with a cough last week - productive with pale green phlegm. Denies fever. States she does have some wheezing and reports she had pneumonia last month. Appointment made for today.  Reason for Disposition . [1] Continuous (nonstop) coughing interferes with work or school AND [2] no improvement using cough treatment per Care Advice  Answer Assessment - Initial Assessment Questions 1. ONSET: "When did the cough begin?"      Started last week 2. SEVERITY: "How bad is the cough today?"      Severe 3. RESPIRATORY DISTRESS: "Describe your breathing."      No 4. FEVER: "Do you have a fever?" If so, ask: "What is your temperature, how was it measured, and when did it start?"     No 5. SPUTUM: "Describe the color of your sputum" (clear, white, yellow, green)     Pale green 6. HEMOPTYSIS: "Are you coughing up any blood?" If so ask: "How much?" (flecks, streaks, tablespoons, etc.)     No 7. CARDIAC HISTORY: "Do you have any history of heart disease?" (e.g., heart attack, congestive heart failure)      No  8. LUNG HISTORY: "Do you have any history of lung disease?"  (e.g., pulmonary embolus, asthma, emphysema)     Asthma 9. PE RISK FACTORS: "Do you have a history of blood clots?" (or: recent major surgery, recent prolonged travel, bedridden )     No 10. OTHER SYMPTOMS: "Do you have any other symptoms?" (e.g., runny nose, wheezing, chest pain)       Wheezing 11. PREGNANCY: "Is there any chance you are pregnant?" "When was your last menstrual period?"       No 12. TRAVEL: "Have you traveled out of the country in the last month?" (e.g., travel history, exposures)       No  Protocols used: [place]

## 2017-08-08 NOTE — Patient Instructions (Signed)
Start Azithromycin antibiotics for infection. Take 2 tablets by mouth today, then 1 tablet daily for 4 additional days.  Shortness of Breath/Wheezing/Cough: Use the albuterol inhaler. Inhale 2 puffs into the lungs every 4 to 6 hours as needed for wheezing, cough, and/or shortness of breath.   Continue Symbicort daily.   Make sure to drink plenty of water and rest.  It was a pleasure to see you today!

## 2017-08-08 NOTE — Telephone Encounter (Signed)
Noted, will evaluate. 

## 2017-08-08 NOTE — Telephone Encounter (Signed)
Pt has appt to see Gentry Fitz NP 08/08/17 at 2:30.

## 2017-08-22 ENCOUNTER — Other Ambulatory Visit: Payer: Self-pay | Admitting: Pulmonary Disease

## 2017-08-22 DIAGNOSIS — R0600 Dyspnea, unspecified: Secondary | ICD-10-CM

## 2017-08-23 ENCOUNTER — Ambulatory Visit (INDEPENDENT_AMBULATORY_CARE_PROVIDER_SITE_OTHER): Payer: Medicare HMO | Admitting: Pulmonary Disease

## 2017-08-23 ENCOUNTER — Encounter: Payer: Self-pay | Admitting: Pulmonary Disease

## 2017-08-23 ENCOUNTER — Ambulatory Visit: Payer: Medicare HMO | Admitting: Pulmonary Disease

## 2017-08-23 VITALS — BP 122/80 | HR 92 | Ht 59.5 in | Wt 133.0 lb

## 2017-08-23 DIAGNOSIS — J45909 Unspecified asthma, uncomplicated: Secondary | ICD-10-CM

## 2017-08-23 DIAGNOSIS — J8283 Eosinophilic asthma: Secondary | ICD-10-CM

## 2017-08-23 DIAGNOSIS — R0600 Dyspnea, unspecified: Secondary | ICD-10-CM

## 2017-08-23 DIAGNOSIS — J309 Allergic rhinitis, unspecified: Secondary | ICD-10-CM | POA: Diagnosis not present

## 2017-08-23 DIAGNOSIS — J82 Pulmonary eosinophilia, not elsewhere classified: Secondary | ICD-10-CM

## 2017-08-23 LAB — PULMONARY FUNCTION TEST
DL/VA % PRED: 103 %
DL/VA: 4.29 ml/min/mmHg/L
DLCO unc % pred: 71 %
DLCO unc: 13.01 ml/min/mmHg
FEF 25-75 POST: 0.73 L/s
FEF 25-75 Pre: 0.64 L/sec
FEF2575-%CHANGE-POST: 14 %
FEF2575-%PRED-PRE: 47 %
FEF2575-%Pred-Post: 54 %
FEV1-%Change-Post: 3 %
FEV1-%Pred-Post: 73 %
FEV1-%Pred-Pre: 71 %
FEV1-PRE: 1.17 L
FEV1-Post: 1.21 L
FEV1FVC-%CHANGE-POST: -3 %
FEV1FVC-%PRED-PRE: 89 %
FEV6-%Change-Post: 8 %
FEV6-%Pred-Post: 89 %
FEV6-%Pred-Pre: 82 %
FEV6-Post: 1.87 L
FEV6-Pre: 1.73 L
FEV6FVC-%Change-Post: 1 %
FEV6FVC-%PRED-POST: 105 %
FEV6FVC-%Pred-Pre: 103 %
FVC-%Change-Post: 6 %
FVC-%PRED-PRE: 79 %
FVC-%Pred-Post: 84 %
FVC-POST: 1.87 L
FVC-PRE: 1.76 L
PRE FEV1/FVC RATIO: 67 %
Post FEV1/FVC ratio: 65 %
Post FEV6/FVC ratio: 100 %
Pre FEV6/FVC Ratio: 98 %
RV % pred: 107 %
RV: 2.25 L
TLC % PRED: 91 %
TLC: 4.02 L

## 2017-08-23 MED ORDER — TIOTROPIUM BROMIDE MONOHYDRATE 2.5 MCG/ACT IN AERS: 2.0000 | INHALATION_SPRAY | Freq: Every day | RESPIRATORY_TRACT | 5 refills | Status: DC

## 2017-08-23 NOTE — Progress Notes (Signed)
PFT done today. 

## 2017-08-23 NOTE — Progress Notes (Signed)
[facility]  Chief Complaint  Patient presents with  . Follow-up    Pt had PFT prior to OV today. Pt has increase productive cough-yellow/clear, wheezing, SOB with exertion. Pt has more wheezing when laying down flat on her back.    Vital signs: BP 122/80 (BP Location: Left Arm, Cuff Size: Normal)   Pulse 92   Ht 4' 11.5" (1.511 m)   Wt 133 lb (60.3 kg)   SpO2 96%   BMI 26.41 kg/m   History of Present Illness: [name] is a 78 y.o. female never smoker with asthma.  She had pneumonia with empyema in June 2016.  She has been on prednisone several times in the past few months for asthma flare ups.  Most recently was in March 2019.  She does better when on prednisone.  Symptoms recur when this is stopped.  She has nasal congestion, post nasal drainage, scratchy throat, cough, wheeze, and chest congestion.  Bringing up clear to yellow sputum.   Not having fever, skin rash, leg swelling, or gland swelling.  No GI symptoms.  Using symbicort, flonase, xyzal, singulair.  Uses albuterol at least once a day.    PFT today shows mild obstruction, and mild diffusion defect.  CXR from 06/21/17 showed basilar scarring.  Physical Exam:  General - pleasant Eyes - pupils reactive, wears glasses ENT - no sinus tenderness, no oral exudate, no LAN, clear nasal drainage Cardiac - regular, no murmur Chest - scattered rhonchi that partially clear with coughing Abd - soft, non tender Ext - no edema Skin - no rashes Neuro - normal strength Psych - normal mood   CBC    Component Value Date/Time   WBC 8.6 06/21/2017 1003   RBC 4.11 06/21/2017 1003   HGB 12.2 06/21/2017 1003   HCT 37.0 06/21/2017 1003   PLT 300.0 06/21/2017 1003   MCV 90.0 06/21/2017 1003   MCH 27.4 08/31/2014 0528   MCHC 33.1 06/21/2017 1003   RDW 13.9 06/21/2017 1003   LYMPHSABS 1.7 06/21/2017 1003   MONOABS 1.0 06/21/2017 1003   EOSABS 0.2 06/21/2017 1003   BASOSABS 0.1  06/21/2017 1003    Discussion: 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  She has recurrent flare ups requiring prednisone.  Her RAST is positive.  Her eosinophil level on peripheral smear is likely falsely low due to recent prednisone therapy.  She is on multiple therapies for her asthma and allergies, and her symptoms persist.  I think she is a candidate for a biologic agent to control her asthma and allergic rhinitis.  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 - add spiriva - continue symbicort, singulair, xyzal, flonase - prn albuterol - will start process to get approval for dupixent    Patient Instructions  Will start approval process for dupixent to treat allergic asthma with eosinophilic phenotype Spiriva respimat two puffs daily  Follow up in 2 months    [name], MD [place] 08/23/2017, 10:45 AM Pager:  [phone]  Flow Sheet  Pulmonary tests: Spirometry 12/07/11>>1.31 (71%), FEV1% 66 Exhaled nitric oxide 12/07/11>>8 ppb (normal) PFT 12/05/12 >> FEV1 1.49 (82%), FEV1% 69, FEF 25-75% 1.09 (69%), TLC 4.38 (85%), DLCO 87%, borderline BD response Rt pleural fluid 08/08/14 >> glucose 59, protein <3, LDH 6800, WBC 16,640 (86%N), cytology negative for malignancy Rt pleural fluid 08/28/14 >> glucose 59, LDH 178, protein 3.5, WBC 303 (68%L) RAST 06/21/17 >> dust mites, IgE  14 PFT 08/23/17 >> FEV1 1.21 (73%), FEV1% 65, TLC 4.02 (91%), DLCO 71%, no BD  Chest imaging: CT chest 08/02/14 >> RML consolidation, small Rt pleural effusion CT chest 08/09/14 >> Rt lower lobe thin walled cavity, scattered GGO and interlobular septal thickening CT chest 09/18/14 >> mild Rt pleural thickening, small Rt pleural effusion, 4 mm nodule LUL, improved RML consolidation, mild RML BTX, RLL cavity 2.5 cm from 3.1 cm, mild BTX lingula CT chest 10/16/14 >> decreased RLL PNA w/o cavity CT chest 04/27/16 >> 4 mm LUL nodule  stable, mild BTX and scarring b/l  Cardiac tests: Lt heart cath 01/24/12 >> Normal coronaries, LVEDP 8 mmHg, EF 55% Echo 08/03/14 >> EF 50 to 55%, mild MR  Past Medical History: She  has a past medical history of Arthritis, Asthma, Cancer of breast ([place]), Hearing loss, Hyperlipidemia, Macular degeneration, and Osteopenia.  Past Surgical History: She  has a past surgical history that includes Abdominal hysterectomy; Cesarean section; Breast enhancement surgery; left heart catheterization with coronary angiogram (N/A, 01/24/2012); Mastectomy (33 yrs ago); Eye surgery (Bilateral, march 2015 and june 2015); and Colonoscopy with propofol (N/A, 04/21/2014).  Family History: Her family history includes Heart attack in her father; Macular degeneration in her mother.  Social History: She  reports that she has never smoked. She has never used smokeless tobacco. She reports that she drinks alcohol. She reports that she does not use drugs.  Medications: Allergies as of 08/23/2017      Reactions   Fosamax [alendronate Sodium] Nausea Only   Sulfonamide Derivatives Rash      Medication List        Accurate as of 08/23/17 10:45 AM. Always use your most recent med list.          albuterol 108 (90 Base) MCG/ACT inhaler Commonly known as:  PROVENTIL HFA;VENTOLIN HFA Inhale 2 puffs into the lungs every 6 (six) hours as needed for wheezing or shortness of breath.   budesonide-formoterol 160-4.5 MCG/ACT inhaler Commonly known as:  SYMBICORT Inhale 2 puffs into the lungs 2 (two) times daily.   diclofenac 75 MG EC tablet Commonly known as:  VOLTAREN Take 1 tablet by mouth up to twice daily as needed for pain.   fluticasone 50 MCG/ACT nasal spray Commonly known as:  FLONASE Place 1 spray into both nostrils 2 (two) times daily.   levocetirizine 5 MG tablet Commonly known as:  XYZAL Take 1 tablet (5 mg total) by mouth every evening.   montelukast 10 MG tablet Commonly known as:   SINGULAIR TAKE 1 TABLET BY MOUTH EVERYDAY AT BEDTIME   PRESERVISION/LUTEIN Caps Take 1 capsule by mouth 2 (two) times daily.   MULTI FOR HER 50+ Tabs Take 1 tablet by mouth daily.   raloxifene 60 MG tablet Commonly known as:  EVISTA Take 1 tablet (60 mg total) by mouth daily with breakfast.   Tiotropium Bromide Monohydrate 2.5 MCG/ACT Aers Commonly known as:  SPIRIVA RESPIMAT Inhale 2 puffs into the lungs daily.   triamcinolone ointment 0.5 % Commonly known as:  KENALOG Apply 1 application topically 2 (two) times daily.   TUMS 500 MG chewable tablet Generic drug:  calcium carbonate Chew 1,000 mg by mouth at bedtime.   Vitamin D 1000 units capsule Take 1,000 Units by mouth daily with breakfast.

## 2017-08-23 NOTE — Patient Instructions (Addendum)
Will start approval process for dupixent to treat allergic asthma with eosinophilic phenotype Spiriva respimat two puffs daily  Follow up in 2 months

## 2017-09-06 DIAGNOSIS — H353221 Exudative age-related macular degeneration, left eye, with active choroidal neovascularization: Secondary | ICD-10-CM | POA: Diagnosis not present

## 2017-09-11 ENCOUNTER — Telehealth: Payer: Self-pay | Admitting: Pulmonary Disease

## 2017-09-11 NOTE — Telephone Encounter (Signed)
[facility] filled out P/A thru [facility], I reviewed the info to make sure it was correct and sent to plan. [facility]) If I don't hear from them in 2 to 3 days I will call [employer]: [phone]. ( Wed. Pm)

## 2017-09-12 ENCOUNTER — Encounter: Payer: Self-pay | Admitting: Primary Care

## 2017-09-12 ENCOUNTER — Other Ambulatory Visit: Payer: Self-pay | Admitting: Primary Care

## 2017-09-12 ENCOUNTER — Ambulatory Visit (INDEPENDENT_AMBULATORY_CARE_PROVIDER_SITE_OTHER): Payer: Medicare HMO | Admitting: Primary Care

## 2017-09-12 ENCOUNTER — Ambulatory Visit (INDEPENDENT_AMBULATORY_CARE_PROVIDER_SITE_OTHER)
Admission: RE | Admit: 2017-09-12 | Discharge: 2017-09-12 | Disposition: A | Discharge status: Home / Self Care | Payer: Medicare HMO | Source: Ambulatory Visit | Attending: Primary Care | Admitting: Primary Care

## 2017-09-12 VITALS — BP 122/72 | HR 94 | Temp 97.8°F | Ht 59.5 in | Wt 134.5 lb

## 2017-09-12 DIAGNOSIS — J069 Acute upper respiratory infection, unspecified: Secondary | ICD-10-CM

## 2017-09-12 DIAGNOSIS — J22 Unspecified acute lower respiratory infection: Secondary | ICD-10-CM

## 2017-09-12 DIAGNOSIS — R05 Cough: Secondary | ICD-10-CM | POA: Diagnosis not present

## 2017-09-12 MED ORDER — AMOXICILLIN-POT CLAVULANATE 875-125 MG PO TABS: 1.0000 | ORAL_TABLET | Freq: Two times a day (BID) | ORAL | 0 refills | Status: DC

## 2017-09-12 MED ORDER — BENZONATATE 200 MG PO CAPS: 200.0000 mg | ORAL_CAPSULE | Freq: Three times a day (TID) | ORAL | 0 refills | Status: DC | PRN

## 2017-09-12 NOTE — Patient Instructions (Signed)
Complete xray(s) prior to leaving today. I will notify you of your results once received.  You may take Benzonatate capsules for cough. Take 1 capsule by mouth three times daily as needed for cough.  Continue your inhalers per Pulmonology.  It was a pleasure to see you today!

## 2017-09-12 NOTE — Progress Notes (Signed)
Subjective:    Patient ID: [name], female    DOB: April 19, 1940, 77 y.o.   MRN: [id number]  HPI  [name] is a 77 year old female with a history of asthma, allergic rhinitis, chronic cough, pneumonia who presents today with a chief complaint of cough.   She also reports fevers, chest congestion, nasal congestion. Her symptoms began one week ago after exposure to her grandchildren who had similar symptoms. Her fevers are running 99. Her cough is productive with a small amount of clear and rarely  yellow sputum.  Overall she's feeling okay. She's been taking her prescription medications and Dayquil without much improvement in cough.  She's following with pulmonology who recently added in Spiriva to her regimen. She was last treated with antibiotics in mid May 2019.  Review of Systems  Constitutional: Positive for fatigue and fever.  HENT: Positive for congestion and sore throat. Negative for ear pain.   Respiratory: Positive for cough. Negative for shortness of breath and wheezing.        Past Medical History:  Diagnosis Date  . Arthritis   . Asthma   . Cancer of breast ([place])    33 years ago, left,   . Hearing loss    both ears, wwears hearing aides  . Hyperlipidemia   . Macular degeneration   . Osteopenia      Social History   Socioeconomic History  . Marital status: Married    Spouse name: Not on file  . Number of children: Not on file  . Years of education: Not on file  . Highest education level: Not on file  Occupational History  . Occupation: retired  Scientific laboratory technician  . Financial resource strain: Not on file  . Food insecurity:    Worry: Not on file    Inability: Not on file  . Transportation needs:    Medical: Not on file    Non-medical: Not on file  Tobacco Use  . Smoking status: Never Smoker  . Smokeless tobacco: Never Used  Substance and Sexual Activity  . Alcohol use: Yes    Comment: glass wine 1x month  . Drug use: No  . Sexual activity: Not on  file  Lifestyle  . Physical activity:    Days per week: Not on file    Minutes per session: Not on file  . Stress: Not on file  Relationships  . Social connections:    Talks on phone: Not on file    Gets together: Not on file    Attends religious service: Not on file    Active member of club or organization: Not on file    Attends meetings of clubs or organizations: Not on file    Relationship status: Not on file  . Intimate partner violence:    Fear of current or ex partner: Not on file    Emotionally abused: Not on file    Physically abused: Not on file    Forced sexual activity: Not on file  Other Topics Concern  . Not on file  Social History Narrative   Married.   2 children, 6 grandchildren.   Retired. Previously was Pharmacist, hospital.    Enjoys participating in church.     Past Surgical History:  Procedure Laterality Date  . ABDOMINAL HYSTERECTOMY    . BREAST ENHANCEMENT SURGERY    . CESAREAN SECTION     x 2  . COLONOSCOPY WITH PROPOFOL N/A 04/21/2014   Procedure: COLONOSCOPY WITH PROPOFOL;  Surgeon: Garlan Fair, MD;  Location: [facility];  Service: Endoscopy;  Laterality: N/A;  . EYE SURGERY Bilateral march 2015 and june 2015   lens for cataracts  . LEFT HEART CATHETERIZATION WITH CORONARY ANGIOGRAM N/A 01/24/2012   Procedure: LEFT HEART CATHETERIZATION WITH CORONARY ANGIOGRAM;  Surgeon: Sueanne Margarita, MD;  Location: [facility];  Service: Cardiovascular;  Laterality: N/A;  . MASTECTOMY  33 yrs ago   bilateral    Family History  Problem Relation Age of Onset  . Macular degeneration Mother   . Heart attack Father     Allergies  Allergen Reactions  . Fosamax [Alendronate Sodium] Nausea Only  . Sulfonamide Derivatives Rash    Current Outpatient Medications on File Prior to Visit  Medication Sig Dispense Refill  . albuterol (PROVENTIL HFA;VENTOLIN HFA) 108 (90 Base) MCG/ACT inhaler Inhale 2 puffs into the lungs every 6 (six) hours as needed for wheezing or  shortness of breath.    . budesonide-formoterol (SYMBICORT) 160-4.5 MCG/ACT inhaler Inhale 2 puffs into the lungs 2 (two) times daily. 10.2 Inhaler 5  . calcium carbonate (TUMS) 500 MG chewable tablet Chew 1,000 mg by mouth at bedtime.     . Cholecalciferol (VITAMIN D) 1000 UNITS capsule Take 1,000 Units by mouth daily with breakfast.     . diclofenac (VOLTAREN) 75 MG EC tablet Take 1 tablet by mouth up to twice daily as needed for pain. 180 tablet 0  . fluticasone (FLONASE) 50 MCG/ACT nasal spray Place 1 spray into both nostrils 2 (two) times daily.    Marland Kitchen levocetirizine (XYZAL) 5 MG tablet Take 1 tablet (5 mg total) by mouth every evening. 90 tablet 1  . montelukast (SINGULAIR) 10 MG tablet TAKE 1 TABLET BY MOUTH EVERYDAY AT BEDTIME 90 tablet 3  . Multiple Vitamins-Minerals (MULTI FOR HER 50+) TABS Take 1 tablet by mouth daily.    . Multiple Vitamins-Minerals (PRESERVISION/LUTEIN) CAPS Take 1 capsule by mouth 2 (two) times daily.    . raloxifene (EVISTA) 60 MG tablet Take 1 tablet (60 mg total) by mouth daily with breakfast. 90 tablet 1  . Tiotropium Bromide Monohydrate (SPIRIVA RESPIMAT) 2.5 MCG/ACT AERS Inhale 2 puffs into the lungs daily. 1 Inhaler 5  . triamcinolone ointment (KENALOG) 0.5 % Apply 1 application topically 2 (two) times daily. (Patient taking differently: Apply 1 application topically as needed. ) 30 g 0   No current facility-administered medications on file prior to visit.     BP 122/72   Pulse 94   Temp 97.8 F (36.6 C) (Oral)   Ht 4' 11.5" (1.511 m)   Wt 134 lb 8 oz (61 kg)   SpO2 96%   BMI 26.71 kg/m    Objective:   Physical Exam  Constitutional: She appears well-nourished. She does not appear ill.  HENT:  Right Ear: Tympanic membrane and ear canal normal.  Left Ear: Tympanic membrane and ear canal normal.  Nose: No mucosal edema. Right sinus exhibits no maxillary sinus tenderness and no frontal sinus tenderness. Left sinus exhibits no maxillary sinus  tenderness and no frontal sinus tenderness.  Mouth/Throat: Oropharynx is clear and moist.  Neck: Neck supple.  Cardiovascular: Normal rate and regular rhythm.  Respiratory: Effort normal. She has wheezes in the right upper field.  Skin: Skin is warm and dry.           Assessment & Plan:  URI:  Congestion, low grade fevers, fatigue x 7 days. History of chronic cough for over one month,  following with pulmonology. Exam today with overall clear lungs, doesn't appear sickly, normal vitals. Do suspect viral component given low grade fevers.  Check chest xray today given persistent cough in office today. Rx for Ladona Ridgel sent to pharmacy. Await chest xray results.  Pleas Koch, NP

## 2017-09-15 NOTE — Telephone Encounter (Signed)
Will route to [employer] to follow up on.

## 2017-09-15 NOTE — Telephone Encounter (Signed)
[name] or [employer], please advise on this. Thanks!

## 2017-09-15 NOTE — Telephone Encounter (Signed)
    VS Please advise if you would like to do appeal for Dupixent or try a different Biologic medication. Thanks.

## 2017-09-15 NOTE — Telephone Encounter (Signed)
Why was medication denied?

## 2017-09-15 NOTE — Telephone Encounter (Signed)
[employer] denied this request under Part D because: Dupixent is non-formulary (not on [employer]'s list of preferred drugs).Before the drug can be covered, we need more info.. I will bring the denial letter and put it on top of your desk top comp. Keyboard. Will route to VS.

## 2017-09-15 NOTE — Telephone Encounter (Signed)
Lelan Pons from [employer] [phone] She want's to know if we want to do an appeal for Dupixent. She said we should of got a Denial letter from the insurance company.

## 2017-09-18 ENCOUNTER — Telehealth: Payer: Self-pay | Admitting: *Deleted

## 2017-09-18 NOTE — Telephone Encounter (Signed)
Noted and appreciate the update.

## 2017-09-18 NOTE — Telephone Encounter (Signed)
Copied from [place] [id number]. Topic: General - Other >> Sep 18, 2017  8:36 AM Yvette Rack wrote: Reason for CRM: pt calling to let Carlis Abbott know that she's much better shes not coughing that much now

## 2017-09-19 NOTE — Telephone Encounter (Signed)
Spoke with [name] at [place] will work on appeal for Dupixent-I have faxed the denial letter to her at [phone] ; she will keep [place] updated.

## 2017-09-21 ENCOUNTER — Telehealth: Payer: Self-pay | Admitting: *Deleted

## 2017-09-21 MED ORDER — FLUCONAZOLE 150 MG PO TABS: 150.0000 mg | ORAL_TABLET | Freq: Once | ORAL | 0 refills | Status: AC

## 2017-09-21 NOTE — Telephone Encounter (Signed)
No availability to be seen, will send in Diflucan

## 2017-09-21 NOTE — Telephone Encounter (Signed)
Copied from [place] [id number]. Topic: General - Other >> Sep 21, 2017  7:09 AM Yvette Rack wrote: Reason for CRM: pt states that she is on a antibiotic and now she has a yeast infection and would like a RX for that sent to the   [id number] [place], [place] if you have any  questions please call [phone]

## 2017-09-21 NOTE — Telephone Encounter (Signed)
Spoken to patient and notified her that Rx has been sent.

## 2017-09-21 NOTE — Addendum Note (Signed)
Addended by: Jearld Fenton on: 09/21/2017 08:57 AM   Modules accepted: Orders

## 2017-09-21 NOTE — Telephone Encounter (Signed)
We did not get the appeal fax [employer] sent 09/20/17, Lelan Pons faxed it again 09/21/17. We got it this time. The appeal was signed and I fax back.

## 2017-09-22 NOTE — Telephone Encounter (Signed)
Someone from [employer] called back. Asking for labs, progress notes, and tried & failed meds.. I faxed those. Will wait for approval.

## 2017-09-26 NOTE — Telephone Encounter (Signed)
Tammy, please advise if you have an update on pt's med.  Thanks!

## 2017-09-29 NOTE — Telephone Encounter (Signed)
Called [employer] about appeal, they haven't heard anything back from the pt's ins. As of earlier this afternoon.

## 2017-10-04 NOTE — Telephone Encounter (Signed)
Marie from [facility] called, pt's appeal was denied. She asked me to send the letter,I will do that once I get it.

## 2017-10-05 NOTE — Telephone Encounter (Signed)
Called [employer] it took 2 ph calls, and a lot of holding but I finally was able to to speak to someone who could help me. Rep confirmed our fax #  And faxed the appeal denial letter to [place]. I will finish this 12/07/17.

## 2017-10-11 NOTE — Telephone Encounter (Signed)
Marie from [employer] called this morning, to check to see if we received the denial letter from the appeal. No, she said she would call [facility] and ask them to Fax it to [place] again. [employer] is considered a third party, that [place] has to fax the denial letter  to [place] so we can fax it to [place]. Waiting.

## 2017-10-16 ENCOUNTER — Ambulatory Visit: Payer: Self-pay

## 2017-10-16 ENCOUNTER — Telehealth: Payer: Self-pay | Admitting: *Deleted

## 2017-10-16 NOTE — Telephone Encounter (Signed)
Patient called in with c/o "itching." She says "it started about 3 weeks ago to my left arm, now it is both arms. There is no rash, no bumps on my arms, they just itch. I've tried Benadryl, but that hyped me up where I couldn't sleep. I haven't tried hydrocortisone cream, but some cream my daughter gave me to try for eczema." I asked about other symptoms, she denies. According to protocol, see PCP within 3 days, no availability with PCP, appointment scheduled for tomorrow at 3 with Webb Silversmith, FNP, care advice given, patient verbalized understanding.  Reason for Disposition . [1] Cause unknown AND [2] present > 7 days  Answer Assessment - Initial Assessment Questions 1. DESCRIPTION: "Describe the itching you are having." "Where is it located?"     Just on my arms 2. SEVERITY: "How bad is it?"    - MILD - doesn't interfere with normal activities   - MODERATE - SEVERE: interferes with work, school, sleep, or other activities      Moderate to severe at night 3. SCRATCHING: "Are there any scratch marks? Bleeding?"     No 4. ONSET: "When did the itching begin?"      About 3 weeks ago on my left arm, now it's on both arms 5. CAUSE: "What do you think is causing the itching?"      I don't know 6. OTHER SYMPTOMS: "Do you have any other symptoms?"      No 7. PREGNANCY: "Is there any chance you are pregnant?" "When was your last menstrual period?"     No  Protocols used: ITCHING - LOCALIZED-A-AH

## 2017-10-16 NOTE — Telephone Encounter (Signed)
Routing message to TB to f/u with PT on denial letter for dupexent with [employer]. TS please advise

## 2017-10-16 NOTE — Telephone Encounter (Signed)
Called [employer] they found the denial letter for the appeal. I was going to give them the Triage fax #, since I've asked for it twice and didn't get it. The rep was looking for another ph. # and fax # to give me, but I accdientally cut [employer] off. I'll call them back in a few mins.Marland Kitchen

## 2017-10-16 NOTE — Telephone Encounter (Signed)
I called [employer] back and gave the rep the Triage fax #. [id number]'s the p/a denial.

## 2017-10-17 ENCOUNTER — Encounter: Payer: Self-pay | Admitting: Internal Medicine

## 2017-10-17 ENCOUNTER — Ambulatory Visit (INDEPENDENT_AMBULATORY_CARE_PROVIDER_SITE_OTHER): Payer: Medicare HMO | Admitting: Internal Medicine

## 2017-10-17 VITALS — BP 128/82 | HR 83 | Temp 97.8°F | Wt 135.2 lb

## 2017-10-17 DIAGNOSIS — R21 Rash and other nonspecific skin eruption: Secondary | ICD-10-CM | POA: Diagnosis not present

## 2017-10-17 NOTE — Telephone Encounter (Signed)
[place], Thanks! I will wait for denial letter to come thru the mail.

## 2017-10-17 NOTE — Telephone Encounter (Signed)
Per Lelan Pons, this [employer] company will not fax a denial letter. Lelan Pons states that a mailed letter of denial will be sent to this office. No call back is needed if there are no further questions. Cb is [phone]

## 2017-10-17 NOTE — Patient Instructions (Signed)
Pruritus  Pruritus is an itching feeling. There are many different conditions and factors that can make your skin itchy. Dry skin is one of the most common causes of itching. Most cases of itching do not require medical attention. Itchy skin can turn into a rash.  Follow these instructions at home:  Watch your pruritus for any changes. Take these steps to help with your condition:  Skin Care  · Moisturize your skin as needed. A moisturizer that contains petroleum jelly is best for keeping moisture in your skin.  · Take or apply medicines only as directed by your health care provider. This may include:  ? Corticosteroid cream.  ? Anti-itch lotions.  ? Oral anti-histamines.  · Apply cool compresses to the affected areas.  · Try taking a bath with:  ? Epsom salts. Follow the instructions on the packaging. You can get these at your local pharmacy or grocery store.  ? Baking soda. Pour a small amount into the bath as directed by your health care provider.  ? Colloidal oatmeal. Follow the instructions on the packaging. You can get this at your local pharmacy or grocery store.  · Try applying baking soda paste to your skin. Stir water into baking soda until it reaches a paste-like consistency.  · Do not scratch your skin.  · Avoid hot showers or baths, which can make itching worse. A cold shower may help with itching as long as you use a moisturizer after.  · Avoid scented soaps, detergents, and perfumes. Use gentle soaps, detergents, perfumes, and other cosmetic products.  General instructions  · Avoid wearing tight clothes.  · Keep a journal to help track what causes your itch. Write down:  ? What you eat.  ? What cosmetic products you use.  ? What you drink.  ? What you wear. This includes jewelry.  · Use a humidifier. This keeps the air moist, which helps to prevent dry skin.  Contact a health care provider if:  · The itching does not go away after several days.  · You sweat at night.  · You have weight loss.  · You  are unusually thirsty.  · You urinate more than normal.  · You are more tired than normal.  · You have abdominal pain.  · Your skin tingles.  · You feel weak.  · Your skin or the whites of your eyes look yellow (jaundice).  · Your skin feels numb.  This information is not intended to replace advice given to you by your health care provider. Make sure you discuss any questions you have with your health care provider.  Document Released: 11/24/2010 Document Revised: 08/20/2015 Document Reviewed: 03/10/2014  Elsevier Interactive Patient Education © 2018 Elsevier Inc.

## 2017-10-18 ENCOUNTER — Encounter: Payer: Self-pay | Admitting: Internal Medicine

## 2017-10-18 NOTE — Progress Notes (Signed)
Subjective:    Patient ID: [name], female    DOB: October 02, 1940, 77 y.o.   MRN: [id number]  HPI  Pt presents to the clinic today with complaint of itching to bilateral wrist and forearms.  She reports this started 1 week ago.  She has not noticed any rash in the area.  She has not come in contact with anything that she might build to.  She denies recent changes in soaps, lotions or detergents.  No one in her home has a similar rash.  She has tried [place] with minimal relief.  She started using hydrocortisone cream over-the-counter this morning and reports marked improvement since that time.  Review of Systems      Past Medical History:  Diagnosis Date  . Arthritis   . Asthma   . Cancer of breast ([place])    33 years ago, left,   . Hearing loss    both ears, wwears hearing aides  . Hyperlipidemia   . Macular degeneration   . Osteopenia     Current Outpatient Medications  Medication Sig Dispense Refill  . albuterol (PROVENTIL HFA;VENTOLIN HFA) 108 (90 Base) MCG/ACT inhaler Inhale 2 puffs into the lungs every 6 (six) hours as needed for wheezing or shortness of breath.    . budesonide-formoterol (SYMBICORT) 160-4.5 MCG/ACT inhaler Inhale 2 puffs into the lungs 2 (two) times daily. 10.2 Inhaler 5  . calcium carbonate (TUMS) 500 MG chewable tablet Chew 1,000 mg by mouth at bedtime.     . Cholecalciferol (VITAMIN D) 1000 UNITS capsule Take 1,000 Units by mouth daily with breakfast.     . diclofenac (VOLTAREN) 75 MG EC tablet Take 1 tablet by mouth up to twice daily as needed for pain. 180 tablet 0  . fluticasone (FLONASE) 50 MCG/ACT nasal spray Place 1 spray into both nostrils 2 (two) times daily.    Marland Kitchen levocetirizine (XYZAL) 5 MG tablet Take 1 tablet (5 mg total) by mouth every evening. 90 tablet 1  . montelukast (SINGULAIR) 10 MG tablet TAKE 1 TABLET BY MOUTH EVERYDAY AT BEDTIME 90 tablet 3  . Multiple Vitamins-Minerals (MULTI FOR HER 50+) TABS Take 1 tablet by mouth daily.    .  Multiple Vitamins-Minerals (PRESERVISION/LUTEIN) CAPS Take 1 capsule by mouth 2 (two) times daily.    . raloxifene (EVISTA) 60 MG tablet Take 1 tablet (60 mg total) by mouth daily with breakfast. 90 tablet 1  . Tiotropium Bromide Monohydrate (SPIRIVA RESPIMAT) 2.5 MCG/ACT AERS Inhale 2 puffs into the lungs daily. 1 Inhaler 5  . triamcinolone ointment (KENALOG) 0.5 % Apply 1 application topically 2 (two) times daily. (Patient taking differently: Apply 1 application topically as needed. ) 30 g 0   No current facility-administered medications for this visit.     Allergies  Allergen Reactions  . Fosamax [Alendronate Sodium] Nausea Only  . Sulfonamide Derivatives Rash    Family History  Problem Relation Age of Onset  . Macular degeneration Mother   . Heart attack Father     Social History   Socioeconomic History  . Marital status: Married    Spouse name: Not on file  . Number of children: Not on file  . Years of education: Not on file  . Highest education level: Not on file  Occupational History  . Occupation: retired  Scientific laboratory technician  . Financial resource strain: Not on file  . Food insecurity:    Worry: Not on file    Inability: Not on file  .  Transportation needs:    Medical: Not on file    Non-medical: Not on file  Tobacco Use  . Smoking status: Never Smoker  . Smokeless tobacco: Never Used  Substance and Sexual Activity  . Alcohol use: Yes    Comment: glass wine 1x month  . Drug use: No  . Sexual activity: Not on file  Lifestyle  . Physical activity:    Days per week: Not on file    Minutes per session: Not on file  . Stress: Not on file  Relationships  . Social connections:    Talks on phone: Not on file    Gets together: Not on file    Attends religious service: Not on file    Active member of club or organization: Not on file    Attends meetings of clubs or organizations: Not on file    Relationship status: Not on file  . Intimate partner violence:    Fear  of current or ex partner: Not on file    Emotionally abused: Not on file    Physically abused: Not on file    Forced sexual activity: Not on file  Other Topics Concern  . Not on file  Social History Narrative   Married.   2 children, 6 grandchildren.   Retired. Previously was Pharmacist, hospital.    Enjoys participating in church.      Constitutional: Denies fever, malaise, fatigue, headache or abrupt weight changes.  Skin: Pt reports itching to bilateral arms. Denies redness, rashes, lesions or ulcercations.    No other specific complaints in a complete review of systems (except as listed in HPI above).  Objective:   Physical Exam  BP 128/82   Pulse 83   Temp 97.8 F (36.6 C) (Oral)   Wt 135 lb 4 oz (61.3 kg)   SpO2 98%   BMI 26.86 kg/m  Wt Readings from Last 3 Encounters:  10/17/17 135 lb 4 oz (61.3 kg)  09/12/17 134 lb 8 oz (61 kg)  08/23/17 133 lb (60.3 kg)    General: Appears her stated age, well developed, well nourished in NAD. Skin: Fine, maculopapular rash noted over bilateral posterior wrist, extending up the forearms bilaterally. Excoriation noted.  BMET    Component Value Date/Time   NA 140 05/31/2017 1009   K 3.6 05/31/2017 1009   CL 106 05/31/2017 1009   CO2 28 05/31/2017 1009   GLUCOSE 84 05/31/2017 1009   BUN 21 05/31/2017 1009   CREATININE 0.63 05/31/2017 1009   CALCIUM 9.6 05/31/2017 1009   GFRNONAA >60 08/31/2014 0528   GFRAA >60 08/31/2014 0528    Lipid Panel     Component Value Date/Time   CHOL 187 05/31/2017 1009   TRIG 172.0 (H) 05/31/2017 1009   HDL 41.10 05/31/2017 1009   CHOLHDL 5 05/31/2017 1009   VLDL 34.4 05/31/2017 1009   LDLCALC 112 (H) 05/31/2017 1009    CBC    Component Value Date/Time   WBC 8.6 06/21/2017 1003   RBC 4.11 06/21/2017 1003   HGB 12.2 06/21/2017 1003   HCT 37.0 06/21/2017 1003   PLT 300.0 06/21/2017 1003   MCV 90.0 06/21/2017 1003   MCH 27.4 08/31/2014 0528   MCHC 33.1 06/21/2017 1003   RDW 13.9 06/21/2017  1003   LYMPHSABS 1.7 06/21/2017 1003   MONOABS 1.0 06/21/2017 1003   EOSABS 0.2 06/21/2017 1003   BASOSABS 0.1 06/21/2017 1003    Hgb A1C No results found for: HGBA1C  Assessment & Plan:  Rash:  She is seeing improvement with hydrocortisone cream over-the-counter Encouraged her to continue to use this 2 times a day until resolved If seems to worsen can try some IM or oral steroids She declines Rx for Hydroxyzine at this time  Advised her to tryZyrtec over-the-counter if Benadryl is too sedating  Return/Follow up precautions discussed Webb Silversmith, NP

## 2017-10-18 NOTE — Telephone Encounter (Signed)
Please refer to 722/19 ph. note. Everything has been documented in that encounter. Nothing further needed.

## 2017-10-19 DIAGNOSIS — H353221 Exudative age-related macular degeneration, left eye, with active choroidal neovascularization: Secondary | ICD-10-CM | POA: Diagnosis not present

## 2017-10-23 NOTE — Telephone Encounter (Signed)
[name], is calling to see if the mailed denial has arrived. Cb is [phone].

## 2017-10-24 ENCOUNTER — Encounter: Payer: Self-pay | Admitting: Pulmonary Disease

## 2017-10-24 ENCOUNTER — Ambulatory Visit: Payer: Medicare HMO | Admitting: Pulmonary Disease

## 2017-10-24 ENCOUNTER — Ambulatory Visit (INDEPENDENT_AMBULATORY_CARE_PROVIDER_SITE_OTHER)
Admission: RE | Admit: 2017-10-24 | Discharge: 2017-10-24 | Disposition: A | Discharge status: Home / Self Care | Payer: Medicare HMO | Source: Ambulatory Visit | Attending: Pulmonary Disease | Admitting: Pulmonary Disease

## 2017-10-24 VITALS — BP 130/80 | HR 86 | Ht 59.5 in | Wt 134.0 lb

## 2017-10-24 DIAGNOSIS — J45909 Unspecified asthma, uncomplicated: Secondary | ICD-10-CM | POA: Diagnosis not present

## 2017-10-24 DIAGNOSIS — J189 Pneumonia, unspecified organism: Secondary | ICD-10-CM

## 2017-10-24 DIAGNOSIS — J309 Allergic rhinitis, unspecified: Secondary | ICD-10-CM

## 2017-10-24 DIAGNOSIS — Z8701 Personal history of pneumonia (recurrent): Secondary | ICD-10-CM | POA: Diagnosis not present

## 2017-10-24 DIAGNOSIS — J479 Bronchiectasis, uncomplicated: Secondary | ICD-10-CM | POA: Diagnosis not present

## 2017-10-24 DIAGNOSIS — J82 Pulmonary eosinophilia, not elsewhere classified: Secondary | ICD-10-CM | POA: Diagnosis not present

## 2017-10-24 DIAGNOSIS — J8283 Eosinophilic asthma: Secondary | ICD-10-CM

## 2017-10-24 NOTE — Progress Notes (Signed)
[facility]  Chief Complaint  Patient presents with  . Follow-up    not needing rescue inhaler, using symbicort and spiriva     Constitutional: BP 130/80 (BP Location: Left Arm, Cuff Size: Normal)   Pulse 86   Ht 4' 11.5" (1.511 m)   Wt 134 lb (60.8 kg)   SpO2 99%   BMI 26.61 kg/m   History of Present Illness: [name] is a 77 y.o. female never smoker with asthma.  She had pneumonia with empyema in June 2016.  She had low grade fever and cough with sputum about 1 month ago.  CXR from 09/12/17 showed infiltrate in Rt mid lung area (I reviewed myself).  She was tx with augmentin.  This helped  She isn't having as much cough now except when she uses spiriva.  She tried using spacer device with spiriva.  Not as much cough, but not sure she is getting medicine in.  Denies sinus congestion, chest pain, skin rash, swelling, bloating, or gland swelling.   Comprehensive Respiratory Exam:  Appearance - well kempt  ENMT - nasal mucosa moist, turbinates clear, midline nasal septum, no dental lesions, no gingival bleeding, no oral exudates, no tonsillar hypertrophy Neck - no masses, trachea midline, no thyromegaly, no elevation in JVP Respiratory - normal appearance of chest wall, normal respiratory effort w/o accessory muscle use, no dullness on percussion, no wheezing or rales CV - s1s2 regular rate and rhythm, no murmurs, no peripheral edema, no varicosities, radial pulses symmetric GI - soft, non tender, no masses, no hepatosplenomegaly Lymph - no adenopathy noted in neck and axillary areas MSK - normal muscle strength and tone, normal gait Ext - no cyanosis, clubbing, or joint inflammation noted Skin - no rashes, lesions, or ulcers Neuro - oriented to person, place, and time Psych - normal mood and affect  Discussion: 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  She has recurrent flare ups requiring prednisone.  Her  RAST is positive.  Her eosinophil level on peripheral smear is likely falsely low due to recent prednisone therapy.  She is on multiple therapies for her asthma and allergies, and her symptoms persist.  I think she is a candidate for a biologic agent to control her asthma and allergic rhinitis.  Will need to f/u with insurance to determine reason for denial of dupixent.  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 - continue symbicort, singulair, flonase, xyzal - prn albuterol - f/u with insurance for approval of biologic agent  Cough from spiriva. - will d/c and monitor off LAMA - if symptoms gets worse, then consider adding incruse or tudorza  Bronchiectasis. - likely related to previous episode of pneumonia - bronchial hygiene  Recent episode of pneumonia - f/u CXR today  Time spent 28 minutes  Patient Instructions  Chest xray today  Stop spiriva  Follow up in 3 months    Kendra Mires, MD [place] 10/24/2017, 9:47 AM Pager:  [phone]  Flow Sheet  Pulmonary tests: Spirometry 12/07/11>>1.31 (71%), FEV1% 66 Exhaled nitric oxide 12/07/11>>8 ppb (normal) PFT 12/05/12 >> FEV1 1.49 (82%), FEV1% 69, FEF 25-75% 1.09 (69%), TLC 4.38 (85%), DLCO 87%, borderline BD response Rt pleural fluid 08/08/14 >> glucose 59, protein <3, LDH 6800, WBC 16,640 (86%N), cytology negative for malignancy Rt pleural fluid 08/28/14 >> glucose 59, LDH 178, protein 3.5, WBC 303 (68%L) RAST 06/21/17 >> dust mites, IgE 14 PFT 08/23/17 >> FEV1 1.21 (  73%), FEV1% 65, TLC 4.02 (91%), DLCO 71%, no BD  Chest imaging: CT chest 08/02/14 >> RML consolidation, small Rt pleural effusion CT chest 08/09/14 >> Rt lower lobe thin walled cavity, scattered GGO and interlobular septal thickening CT chest 09/18/14 >> mild Rt pleural thickening, small Rt pleural effusion, 4 mm nodule LUL, improved RML consolidation, mild RML BTX, RLL cavity 2.5 cm from 3.1 cm, mild BTX  lingula CT chest 10/16/14 >> decreased RLL PNA w/o cavity CT chest 04/27/16 >> 4 mm LUL nodule stable, mild BTX and scarring b/l  Cardiac tests: Lt heart cath 01/24/12 >> Normal coronaries, LVEDP 8 mmHg, EF 55% Echo 08/03/14 >> EF 50 to 55%, mild MR  Past Medical History: She  has a past medical history of Arthritis, Asthma, Cancer of breast ([place]), Hearing loss, Hyperlipidemia, Macular degeneration, and Osteopenia.  Past Surgical History: She  has a past surgical history that includes Abdominal hysterectomy; Cesarean section; Breast enhancement surgery; left heart catheterization with coronary angiogram (N/A, 01/24/2012); Mastectomy (33 yrs ago); Eye surgery (Bilateral, march 2015 and june 2015); and Colonoscopy with propofol (N/A, 04/21/2014).  Family History: Her family history includes Heart attack in her father; Macular degeneration in her mother.  Social History: She  reports that she has never smoked. She has never used smokeless tobacco. She reports that she drinks alcohol. She reports that she does not use drugs.  Medications: Allergies as of 10/24/2017      Reactions   Fosamax [alendronate Sodium] Nausea Only   Sulfonamide Derivatives Rash      Medication List        Accurate as of 10/24/17  9:47 AM. Always use your most recent med list.          albuterol 108 (90 Base) MCG/ACT inhaler Commonly known as:  PROVENTIL HFA;VENTOLIN HFA Inhale 2 puffs into the lungs every 6 (six) hours as needed for wheezing or shortness of breath.   budesonide-formoterol 160-4.5 MCG/ACT inhaler Commonly known as:  SYMBICORT Inhale 2 puffs into the lungs 2 (two) times daily.   diclofenac 75 MG EC tablet Commonly known as:  VOLTAREN Take 1 tablet by mouth up to twice daily as needed for pain.   fluticasone 50 MCG/ACT nasal spray Commonly known as:  FLONASE Place 1 spray into both nostrils 2 (two) times daily.   levocetirizine 5 MG tablet Commonly known as:  XYZAL Take 1 tablet (5 mg  total) by mouth every evening.   montelukast 10 MG tablet Commonly known as:  SINGULAIR TAKE 1 TABLET BY MOUTH EVERYDAY AT BEDTIME   PRESERVISION/LUTEIN Caps Take 1 capsule by mouth 2 (two) times daily.   MULTI FOR HER 50+ Tabs Take 1 tablet by mouth daily.   raloxifene 60 MG tablet Commonly known as:  EVISTA Take 1 tablet (60 mg total) by mouth daily with breakfast.   triamcinolone ointment 0.5 % Commonly known as:  KENALOG Apply 1 application topically 2 (two) times daily.   TUMS 500 MG chewable tablet Generic drug:  calcium carbonate Chew 1,000 mg by mouth at bedtime.   Vitamin D 1000 units capsule Take 1,000 Units by mouth daily with breakfast.

## 2017-10-24 NOTE — Patient Instructions (Signed)
Chest xray today  Stop spiriva  Follow up in 3 months

## 2017-10-26 ENCOUNTER — Telehealth: Payer: Self-pay | Admitting: Pulmonary Disease

## 2017-10-26 NOTE — Telephone Encounter (Signed)
Dg Chest 2 View  Result Date: 10/24/2017 CLINICAL DATA:  Follow-up pneumonia EXAM: CHEST - 2 VIEW COMPARISON:  09/12/2017 FINDINGS: Cardiac shadows within normal limits. Aortic calcifications are seen. Previously seen patchy changes in right mid lung have improved in the interval from prior exam. Mild interstitial changes are noted. Some persistent thickening of the major fissures is noted bilaterally. IMPRESSION: Persistent thickening of the major fissures bilaterally with improved aeration in the right lung. Electronically Signed   By: Inez Catalina M.D.   On: 10/24/2017 11:46     Please let her know that CXR shows resolution of pneumonia in right lung that was seen on CXR from 09/12/17.

## 2017-10-26 NOTE — Telephone Encounter (Signed)
Attempted to call patient today regarding results. I did not receive an answer at time of call. I have left a voicemail message for pt to return call. X1  

## 2017-10-26 NOTE — Telephone Encounter (Signed)
Routing message to TS for update

## 2017-10-27 ENCOUNTER — Telehealth: Payer: Self-pay | Admitting: *Deleted

## 2017-10-27 NOTE — Telephone Encounter (Signed)
Attempted to call patient today regarding results. I did not receive an answer at time of call. I have left a voicemail message for pt to return call. X2  

## 2017-10-27 NOTE — Telephone Encounter (Signed)
They are closed for the wk., I'll call them 10/30/17.

## 2017-10-30 NOTE — Telephone Encounter (Signed)
Spoke with pt. She is aware of results. Nothing further was needed.  

## 2017-10-30 NOTE — Telephone Encounter (Signed)
Pt is calling back [phone]

## 2017-10-30 NOTE — Telephone Encounter (Signed)
I explained to her([name]) I had given VS pt's Dupixent folder to review what the appeal letter said. VS sent me a staff message this morning. He doesn't understand what they want. Pt. Has failed meds in the same classifications as the ones listed, also ICS inhalers, and Pred. I've called twice and haven't gotten anywhere. The second time [employer] hung up on me. I'll try again. I called [employer] back, they gave me [employer] #. She told me if the p/a and appeal are denied, a second appeal is sent to them automatically. [facility] has not received the second appeal letter yet. I told the rep "Thank you", but that not what I was told.  The rep at [facility] said I needed to call this #: to start the appeal.  I also put a call into [place], our rembursiment rep.. She hasn't had a chance to call me back. Will route this to VS so he'll know what's going on.

## 2017-10-30 NOTE — Telephone Encounter (Signed)
I called [name] back, They are at a stand still with [facility]. They asked for our tax Id # and of course [name] with [employer] didn't have it.

## 2017-10-30 NOTE — Telephone Encounter (Signed)
I'll create another encounter, I closed it by mistake.

## 2017-10-31 NOTE — Telephone Encounter (Signed)
Noted.  Please make sure that [name] is in the loop about what our actions have been in dealing with her insurance plan.

## 2017-11-01 NOTE — Telephone Encounter (Signed)
Called and spoke with patient to make her aware of insurance appeal for dupixent. Pt verbalized understanding and had no concerns Routing to TS for further update on appeal.

## 2017-11-01 NOTE — Telephone Encounter (Addendum)
Hey Dr.Sood, I spoke to [place], she said we have to send an appeal letter. She suggested you put in the letter the list of medicines (Qvar, Spiriva Respimat,Pred. 10 mg,Proventil neb. Sol..) that the pt has tried and failed ( the fact that the list is comparable to the med's the [place] listed), about the multiple times pt has had to go on Prednisone,and of course what you think is relevant as well as helpful in getting Dupixent for the pt.. Will route to [facility] and VS.

## 2017-11-02 NOTE — Telephone Encounter (Signed)
Will keep look out for appeal fax and paperwork on pt for VS to complete and sign Routing message to myself to f/u

## 2017-11-03 NOTE — Telephone Encounter (Signed)
No appeal has been faxed or rec'd at this time on pt. Checked folders at front desk/office and VS cubby. Will keep check for this appeal to be faxed.

## 2017-11-04 ENCOUNTER — Other Ambulatory Visit: Payer: Self-pay | Admitting: Primary Care

## 2017-11-04 DIAGNOSIS — M5442 Lumbago with sciatica, left side: Principal | ICD-10-CM

## 2017-11-04 DIAGNOSIS — G8929 Other chronic pain: Secondary | ICD-10-CM

## 2017-11-06 NOTE — Telephone Encounter (Addendum)
This appeal will be the 2nd one. [employer] needs a formal written letter from Dr. Halford Chessman. I'll put a sample letter along with pt's folder on his desk unless you'd rather I give it to you [facility].Please ask him to refer to the note I created 11/01/2017 at 5:11also.   I'll route this to [facility]. Skype me please and let me know what you want me to do.

## 2017-11-06 NOTE — Telephone Encounter (Signed)
I have checked up front, VS look at and triage and I can not locate an appeal.

## 2017-11-08 NOTE — Telephone Encounter (Signed)
I gave [name]'s folder and the sample letter to [facility]. I'll wait for VS to review and write letter, then fax it to [employer].Marland Kitchen

## 2017-11-08 NOTE — Telephone Encounter (Signed)
Tammy will be providing me sample letter and folder today. Once these documents are rec'd, will hand this info to VS for review.

## 2017-11-09 NOTE — Telephone Encounter (Signed)
[name] will be providing me sample letter and folder today in blue folder for pt Placed on VS desk today  VS please advise

## 2017-11-10 NOTE — Telephone Encounter (Signed)
I called the pt to let her know we haven't forgotten about her. I let her know we are in the process of doing an appeal. I told her as soon as VS writes the letter and it's given to me, I'll fax it to [facility] and write urgent on the appeal letter. We should get it back in three days or less.

## 2017-11-14 NOTE — Telephone Encounter (Signed)
Called Marie at number provided, was placed on hold for over 7 minutes. Will route to TS to follow up on.

## 2017-11-14 NOTE — Telephone Encounter (Signed)
Lelan Pons happened to answer the phone.(That was a few mins. Ago.)  I explained to her, VS's nurse has not had a chance to speak with him today about the appeal letter. When [place] sees or talks to him she'll ask him and then let me know.  Kelli skyped me VS said he would have the letter ready by this afternoon or tomorrow morning, at the latest. Kathlene November at [facility] and made her aware. I will fax the letter as soon as I get it.

## 2017-11-14 NOTE — Telephone Encounter (Signed)
Lelan Pons from [facility] calling to follow up on [place] denial on dupixent.  CB is [phone].

## 2017-11-15 ENCOUNTER — Encounter: Payer: Self-pay | Admitting: Pulmonary Disease

## 2017-11-15 ENCOUNTER — Telehealth: Payer: Self-pay

## 2017-11-15 DIAGNOSIS — M5441 Lumbago with sciatica, right side: Principal | ICD-10-CM

## 2017-11-15 DIAGNOSIS — G8929 Other chronic pain: Secondary | ICD-10-CM

## 2017-11-15 NOTE — Telephone Encounter (Signed)
Noted, referral placed.  

## 2017-11-15 NOTE — Telephone Encounter (Signed)
Spoke with CSR from [facility] and she states a peer to peer cannot be done on a Medicare supplement pt;s.  We must mail a letter or fax it to [phone].   Address [address] [place], [place]

## 2017-11-15 NOTE — Telephone Encounter (Signed)
[facility] Dept.. The rep gave me [facility] ph # and fax #s. The rep I spoke to at [facility] gave me a web site to go to: medicare Part D [email or web address] I printed off the form she said I needed to fill out.(Did that.) I am also faxing Dr. Juanetta Gosling letter once it's written, clinical notes, labs and tried & failed meds.Marland Kitchen

## 2017-11-15 NOTE — Telephone Encounter (Signed)
TS please advise on progress of letter.

## 2017-11-15 NOTE — Telephone Encounter (Signed)
Spoke with patient, she wants to get P.T. Done there again like she did in March, [place] location. Patient states she will get this scheduled, sent referral over. May have to have this referral changed to say specifically P.T. But will wait to hear back.-Autumm Hattery V Casen Pryor, RMA

## 2017-11-15 NOTE — Telephone Encounter (Signed)
error 

## 2017-11-15 NOTE — Telephone Encounter (Signed)
Can we get a phone number that I can call to speak to a physician reviewer directly at [facility] to do peer to peer rather than writing a letter.  I feel that speaking directly with a peer to peer review will be more effective.

## 2017-11-15 NOTE — Telephone Encounter (Signed)
Copied from [place] [facility] [place] [id number]. Topic: Referral - Request >> Nov 15, 2017  8:54 AM Mylinda Latina, NT wrote: Reason for CRM:   patient was calling and states she would like another referral to [facility]. She sates she called to try to make and appt and they stated she needs a new referral CB# [phone]

## 2017-11-16 NOTE — Telephone Encounter (Signed)
Hey Dr. Halford Chessman, did you get a chance to write Joann Castiglia's dob [birth year] appeal letter? I need to fax the letter and the other info. To [facility] as soon as I can.

## 2017-11-17 NOTE — Telephone Encounter (Signed)
Dr. Halford Chessman has pt's folder. I gave VS the [facility] Address and clinical notes, labs, etc.. I will fax everything once VS has a chance to write the appeal letter. Routing to VS for f/u.

## 2017-11-20 DIAGNOSIS — M5116 Intervertebral disc disorders with radiculopathy, lumbar region: Secondary | ICD-10-CM | POA: Diagnosis not present

## 2017-11-20 DIAGNOSIS — M79604 Pain in right leg: Secondary | ICD-10-CM | POA: Diagnosis not present

## 2017-11-20 DIAGNOSIS — M5431 Sciatica, right side: Secondary | ICD-10-CM | POA: Diagnosis not present

## 2017-11-21 ENCOUNTER — Telehealth: Payer: Self-pay | Admitting: Pulmonary Disease

## 2017-11-21 NOTE — Telephone Encounter (Signed)
[facility], unable to reach. Left message to give [place] a call back.

## 2017-11-22 NOTE — Telephone Encounter (Signed)
Dr. Sood - please advise. Thanks. 

## 2017-11-22 NOTE — Telephone Encounter (Signed)
Will route to [employer] since she is working on [employer].

## 2017-11-23 DIAGNOSIS — M79604 Pain in right leg: Secondary | ICD-10-CM | POA: Diagnosis not present

## 2017-11-23 DIAGNOSIS — M5116 Intervertebral disc disorders with radiculopathy, lumbar region: Secondary | ICD-10-CM | POA: Diagnosis not present

## 2017-11-23 DIAGNOSIS — M5431 Sciatica, right side: Secondary | ICD-10-CM | POA: Diagnosis not present

## 2017-11-24 NOTE — Telephone Encounter (Signed)
Spoke with rep at [employer]-aware that we are working on appeal and will update them as we can. Rep stated he will note that on patients account and no further call needed at this time.

## 2017-11-25 ENCOUNTER — Other Ambulatory Visit: Payer: Self-pay | Admitting: Primary Care

## 2017-11-25 DIAGNOSIS — J4540 Moderate persistent asthma, uncomplicated: Secondary | ICD-10-CM

## 2017-11-28 ENCOUNTER — Ambulatory Visit (INDEPENDENT_AMBULATORY_CARE_PROVIDER_SITE_OTHER): Payer: Medicare HMO | Admitting: Internal Medicine

## 2017-11-28 ENCOUNTER — Encounter: Payer: Self-pay | Admitting: Internal Medicine

## 2017-11-28 VITALS — BP 128/80 | HR 88 | Temp 97.8°F | Wt 135.0 lb

## 2017-11-28 DIAGNOSIS — M79604 Pain in right leg: Secondary | ICD-10-CM | POA: Diagnosis not present

## 2017-11-28 NOTE — Patient Instructions (Signed)

## 2017-11-28 NOTE — Telephone Encounter (Signed)
Last prescribed on 04/25/2017. Last office visit on 09/12/2017

## 2017-11-28 NOTE — Progress Notes (Signed)
Subjective:    Patient ID: [name], female    DOB: 1940-11-08, 77 y.o.   MRN: [id number]  HPI  Pt presents to the clinic today with c/o right leg pain. She reports this started 1 month ago. She reports the pain starts in her right buttock and radiates down to her right foot. She describes the pain as sharp and shooting. She denies numbness, tingling or weakness. She started PT 2 weeks ago and initially was not having any improvement and the physical therapist suggested that she needed a MRI. However, over the weekend, she was doing PT exercises at home and reports her pain is much improved. She has been taking Diclofenac as prescribed. She denies recent fall or injury to the area. Xray of lumbar spine from 05/2017 showed:  IMPRESSION: Progressive lower lumbar degenerative disc disease, most notable at L3-4 and L5-S1. Lower lumbar facet osteoarthritis. Findings could certainly relate to symptoms.   Review of Systems      Past Medical History:  Diagnosis Date  . Arthritis   . Asthma   . Cancer of breast ([place])    33 years ago, left,   . Hearing loss    both ears, wwears hearing aides  . Hyperlipidemia   . Macular degeneration   . Osteopenia     Current Outpatient Medications  Medication Sig Dispense Refill  . albuterol (PROVENTIL HFA;VENTOLIN HFA) 108 (90 Base) MCG/ACT inhaler Inhale 2 puffs into the lungs every 6 (six) hours as needed for wheezing or shortness of breath.    . budesonide-formoterol (SYMBICORT) 160-4.5 MCG/ACT inhaler Inhale 2 puffs into the lungs 2 (two) times daily. 10.2 Inhaler 5  . calcium carbonate (TUMS) 500 MG chewable tablet Chew 1,000 mg by mouth at bedtime.     . Cholecalciferol (VITAMIN D) 1000 UNITS capsule Take 1,000 Units by mouth daily with breakfast.     . diclofenac (VOLTAREN) 75 MG EC tablet TAKE 1 TABLET BY MOUTH UP TO TWICE DAILY AS NEEDED FOR PAIN. 180 tablet 0  . fluticasone (FLONASE) 50 MCG/ACT nasal spray Place 1 spray into both  nostrils 2 (two) times daily.    Marland Kitchen levocetirizine (XYZAL) 5 MG tablet Take 1 tablet (5 mg total) by mouth every evening. 90 tablet 1  . montelukast (SINGULAIR) 10 MG tablet TAKE 1 TABLET BY MOUTH EVERYDAY AT BEDTIME 90 tablet 3  . Multiple Vitamins-Minerals (MULTI FOR HER 50+) TABS Take 1 tablet by mouth daily.    . Multiple Vitamins-Minerals (PRESERVISION/LUTEIN) CAPS Take 1 capsule by mouth 2 (two) times daily.    . raloxifene (EVISTA) 60 MG tablet Take 1 tablet (60 mg total) by mouth daily with breakfast. 90 tablet 1  . triamcinolone ointment (KENALOG) 0.5 % Apply 1 application topically 2 (two) times daily. (Patient taking differently: Apply 1 application topically as needed. ) 30 g 0   No current facility-administered medications for this visit.     Allergies  Allergen Reactions  . Fosamax [Alendronate Sodium] Nausea Only  . Sulfonamide Derivatives Rash    Family History  Problem Relation Age of Onset  . Macular degeneration Mother   . Heart attack Father     Social History   Socioeconomic History  . Marital status: Married    Spouse name: Not on file  . Number of children: Not on file  . Years of education: Not on file  . Highest education level: Not on file  Occupational History  . Occupation: retired  Scientific laboratory technician  .  Financial resource strain: Not on file  . Food insecurity:    Worry: Not on file    Inability: Not on file  . Transportation needs:    Medical: Not on file    Non-medical: Not on file  Tobacco Use  . Smoking status: Never Smoker  . Smokeless tobacco: Never Used  Substance and Sexual Activity  . Alcohol use: Yes    Comment: glass wine 1x month  . Drug use: No  . Sexual activity: Not on file  Lifestyle  . Physical activity:    Days per week: Not on file    Minutes per session: Not on file  . Stress: Not on file  Relationships  . Social connections:    Talks on phone: Not on file    Gets together: Not on file    Attends religious service:  Not on file    Active member of club or organization: Not on file    Attends meetings of clubs or organizations: Not on file    Relationship status: Not on file  . Intimate partner violence:    Fear of current or ex partner: Not on file    Emotionally abused: Not on file    Physically abused: Not on file    Forced sexual activity: Not on file  Other Topics Concern  . Not on file  Social History Narrative   Married.   2 children, 6 grandchildren.   Retired. Previously was Pharmacist, hospital.    Enjoys participating in church.      Constitutional: Denies fever, malaise, fatigue, headache or abrupt weight changes.  Musculoskeletal: Pt reports right leg pain. Denies decrease in range of motion, difficulty with gait, muscle pain or joint swelling.  Neurological: Denies numbness or tingling of LLE or problems with balance and coordination.    No other specific complaints in a complete review of systems (except as listed in HPI above).  Objective:   Physical Exam   BP 128/80   Pulse 88   Temp 97.8 F (36.6 C) (Oral)   Wt 135 lb (61.2 kg)   SpO2 97%   BMI 26.81 kg/m  Wt Readings from Last 3 Encounters:  11/28/17 135 lb (61.2 kg)  10/24/17 134 lb (60.8 kg)  10/17/17 135 lb 4 oz (61.3 kg)    General: Appears her stated age, well developed, well nourished in NAD. Musculoskeletal: Normal flexion, extension and rotation of the spine. Normal internal and external rotation of the right hip. Normal flexion and extension of the right knee. Crepitus noted with ROM of knee. Strength 5/5 BLE. No difficulty with gait. Able to walk on toes and heels.  Neurological: Alert and oriented. Negative SLR on the right.   BMET    Component Value Date/Time   NA 140 05/31/2017 1009   K 3.6 05/31/2017 1009   CL 106 05/31/2017 1009   CO2 28 05/31/2017 1009   GLUCOSE 84 05/31/2017 1009   BUN 21 05/31/2017 1009   CREATININE 0.63 05/31/2017 1009   CALCIUM 9.6 05/31/2017 1009   GFRNONAA >60 08/31/2014 0528    GFRAA >60 08/31/2014 0528    Lipid Panel     Component Value Date/Time   CHOL 187 05/31/2017 1009   TRIG 172.0 (H) 05/31/2017 1009   HDL 41.10 05/31/2017 1009   CHOLHDL 5 05/31/2017 1009   VLDL 34.4 05/31/2017 1009   LDLCALC 112 (H) 05/31/2017 1009    CBC    Component Value Date/Time   WBC 8.6 06/21/2017 1003  RBC 4.11 06/21/2017 1003   HGB 12.2 06/21/2017 1003   HCT 37.0 06/21/2017 1003   PLT 300.0 06/21/2017 1003   MCV 90.0 06/21/2017 1003   MCH 27.4 08/31/2014 0528   MCHC 33.1 06/21/2017 1003   RDW 13.9 06/21/2017 1003   LYMPHSABS 1.7 06/21/2017 1003   MONOABS 1.0 06/21/2017 1003   EOSABS 0.2 06/21/2017 1003   BASOSABS 0.1 06/21/2017 1003    Hgb A1C No results found for: HGBA1C         Assessment & Plan:   Right Leg Pain:  Improving per patient Will hold off on MRI at this time Continue Diclofenac, stretching Heat may be helpful  Advised her to follow up with PCP if symptoms worsen Webb Silversmith, NP

## 2017-11-28 NOTE — Telephone Encounter (Signed)
Noted, refill sent to pharmacy. 

## 2017-11-29 DIAGNOSIS — H353221 Exudative age-related macular degeneration, left eye, with active choroidal neovascularization: Secondary | ICD-10-CM | POA: Diagnosis not present

## 2017-12-04 DIAGNOSIS — M5413 Radiculopathy, cervicothoracic region: Secondary | ICD-10-CM | POA: Diagnosis not present

## 2017-12-04 DIAGNOSIS — M5116 Intervertebral disc disorders with radiculopathy, lumbar region: Secondary | ICD-10-CM | POA: Diagnosis not present

## 2017-12-04 DIAGNOSIS — M79604 Pain in right leg: Secondary | ICD-10-CM | POA: Diagnosis not present

## 2017-12-05 NOTE — Telephone Encounter (Signed)
I called [employer] back to make them aware. [name] was on another line, I lm with Katrina. Will wait till next wk. When VS returns.

## 2017-12-05 NOTE — Telephone Encounter (Signed)
[name] from [facility] called this morning(they are handling the appeal for [place]) to check the status. I told her we have asked several times about the letter. Kelli I will call her back and let her know what you said in the ph note. Will route to [facility] to make her aware. Thanks!

## 2017-12-05 NOTE — Telephone Encounter (Signed)
VS has everything needed for pt's letter that is needing creating He is out of the office this week, will return back in office on 12/11/17 VS is aware of the letter that is needing completed, and will completed as soon as possible.  Will f/u on 12/11/17 when he returns

## 2017-12-05 NOTE — Telephone Encounter (Signed)
Routing this to VS, please advise on patients letter. Thank you.

## 2017-12-12 NOTE — Telephone Encounter (Signed)
Tammy can you please provide an update on this? Thanks. 

## 2017-12-12 NOTE — Telephone Encounter (Signed)
I skyped Kelli this morning, VS said he would write the letter this afternoon. Once I get it I'll send it to Lelan Pons at [employer].

## 2017-12-12 NOTE — Telephone Encounter (Signed)
VS please advise when letter has been completed.

## 2017-12-13 ENCOUNTER — Encounter: Payer: Self-pay | Admitting: Pulmonary Disease

## 2017-12-13 DIAGNOSIS — J8283 Eosinophilic asthma: Secondary | ICD-10-CM | POA: Insufficient documentation

## 2017-12-13 DIAGNOSIS — J82 Pulmonary eosinophilia, not elsewhere classified: Secondary | ICD-10-CM

## 2017-12-13 NOTE — Telephone Encounter (Signed)
rec'd blue folder completed and letter created by VS Handed the folder to TS in injection room this morning She has everything needed.  Nothing further needed at this time.

## 2017-12-13 NOTE — Telephone Encounter (Signed)
Letter completed.  Please print, and I will sign.

## 2017-12-14 NOTE — Telephone Encounter (Addendum)
Faxed appeal letter to [place] attn: Lelan Pons. I got the transmission log so Lelan Pons received the fax.. She faxed the letter to [facility]. I'll create another note when I hear from [facility]. Nothing further needed.

## 2017-12-25 ENCOUNTER — Other Ambulatory Visit: Payer: Self-pay | Admitting: Primary Care

## 2017-12-25 DIAGNOSIS — R053 Chronic cough: Secondary | ICD-10-CM

## 2017-12-25 DIAGNOSIS — R05 Cough: Secondary | ICD-10-CM

## 2017-12-28 ENCOUNTER — Other Ambulatory Visit: Payer: Self-pay | Admitting: Primary Care

## 2017-12-29 ENCOUNTER — Other Ambulatory Visit: Payer: Self-pay | Admitting: Primary Care

## 2017-12-29 DIAGNOSIS — G8929 Other chronic pain: Secondary | ICD-10-CM

## 2017-12-29 DIAGNOSIS — M5442 Lumbago with sciatica, left side: Principal | ICD-10-CM

## 2018-01-01 ENCOUNTER — Telehealth: Payer: Self-pay | Admitting: Primary Care

## 2018-01-01 ENCOUNTER — Telehealth: Payer: Self-pay

## 2018-01-01 MED ORDER — DICLOFENAC SODIUM 75 MG PO TBEC: DELAYED_RELEASE_TABLET | ORAL | 0 refills | Status: DC

## 2018-01-01 NOTE — Telephone Encounter (Signed)
Send refill as requested.

## 2018-01-01 NOTE — Telephone Encounter (Signed)
PLEASE NOTE: All timestamps contained within this report are represented as [place] Standard Time. CONFIDENTIALTY NOTICE: This fax transmission is intended only for the addressee. It contains information that is legally privileged, confidential or otherwise protected from use or disclosure. If you are not the intended recipient, you are strictly prohibited from reviewing, disclosing, copying using or disseminating any of this information or taking any action in reliance on or regarding this information. If you have received this fax in error, please notify us immediately by telephone so that we can arrange for its return to [place]. Phone: [phone], Toll-Free: [phone], Fax: [phone] Page: 1 of 3 Call Id: [id number] [place] Night - Client >>>Contains Verbal Order - Signature Required<<< [place] Patient Name: [name] Gender: Female DOB: Nov 14, 1940 Age: 77 Y 9 M 21 D Return Phone Number: [phone] (Primary), [phone] (Secondary) Address[place] [address] Client [place] Night - Client Client Site [place] Physician Alma Friendly - NP Contact Type Call Who Is Calling Patient / Member / Family / Caregiver Call Type Triage / Clinical Relationship To Patient Self Return Phone Number [phone] (Primary) Chief Complaint Prescription Refill or Medication Request (non symptomatic) Reason for Call Medication Question / Request Initial Comment Caller states she needs her montelukast Translation No Nurse Assessment Nurse: Hassell Done, RN, Melanie Date/Time (Eastern Time): 12/30/2017 9:17:55 AM Confirm and document reason for call. If symptomatic, describe symptoms. ---Caller states she is out of her montelukast and her last refill ran out. She last filled in June. Caller denies having any symptoms. [facility] [phone]. Does the  patient have any new or worsening symptoms? ---No Nurse: Hassell Done, RN, Melanie Date/Time (Eastern Time): 12/30/2017 9:25:43 AM Please select the assessment type ---Verbal order / New medication order Does the client directives allow for assistance with medications after hours? ---Yes Other current medications? ---Unknown Medication allergies? ---No Pharmacy name and phone number. [phone] Does the client directive allow for RN to call in the medication order to the pharmacy? ---Yes Guidelines Guideline Title Affirmed Question Affirmed Notes Nurse Date/Time ([place] Time) Disp. Time Eilene Ghazi Time) Disposition Final User 12/30/2017 9:29:37 AM Pharmacy Call Hassell Done, RN, Melanie Reason: verbal order for 5 days of montelukast called to pharmacy 12/30/2017 9:29:50 AM Clinical Call Yes Hassell Done, RN, Melanie PLEASE NOTE: All timestamps contained within this report are represented as [place] Standard Time. CONFIDENTIALTY NOTICE: This fax transmission is intended only for the addressee. It contains information that is legally privileged, confidential or otherwise protected from use or disclosure. If you are not the intended recipient, you are strictly prohibited from reviewing, disclosing, copying using or disseminating any of this information or taking any action in reliance on or regarding this information. If you have received this fax in error, please notify us immediately by telephone so that we can arrange for its return to [place]. Phone: [phone], Toll-Free: [phone], Fax: [phone] Page: 2 of 3 Call Id: [id number] Verbal Orders/Maintenance Medications Medication Refill Route Dosage Regime Duration Admin Instructions User Name montelukast Oral 10 mg at bedtime 5 Days Take one tablet by mouth daily at bedtime Hassell Done, RN, Threasa Beards PLEASE NOTE: All timestamps contained within this report are represented as [place] Standard Time. CONFIDENTIALTY NOTICE: This fax transmission is  intended only for the addressee. It contains information that is legally privileged, confidential or otherwise protected from use or disclosure. If you are not the intended recipient, you are strictly prohibited from reviewing,  disclosing, copying using or disseminating any of this information or taking any action in reliance on or regarding this information. If you have received this fax in error, please notify us immediately by telephone so that we can arrange for its return to [place]. Phone: [phone], Toll-Free: [phone], Fax: [phone] Page: 3 of 3 Call Id: [id number] [place] >>>Contains Verbal Order - Signature Required<<< [address], [place] [address] [phone] Fax: [phone] MEDICATION ORDER [place] Night - Client [place] - Night Date: 12/30/2017 From: [facility] Department To: Alma Friendly - NP Please sign the order for the approved drug(s) given by our call center nurse on your behalf. Fax to [phone] within 5 business days. Thank you. Date Eilene Ghazi Time): 12/30/2017 8:54:40 AM Triage RN: Verner Chol, RN NAME: Filbert Berthold PHONE NUMBER: [phone] (Primary), [phone] (Secondary) BIRTHDATE: [birth year] [place] [address] CALLER: Self NAME: Rx Given Medication Refill Route Dosage Regime Duration Admin Instructions User Name montelukast Oral 10 mg at bedtime 5 Days Take one tablet by mouth daily at bedtime Hassell Done, RN, Threasa Beards MD Signature Date

## 2018-01-01 NOTE — Telephone Encounter (Signed)
Pt states she has picked up her rx for levocetirizine (XYZAL) 5 MG tablet, but still needs her montelukast (SINGULAIR) 10 MG tablet.  She called today to f/up, states the pharmacy gave her 3 doses.  She will finish those up today.

## 2018-01-01 NOTE — Telephone Encounter (Signed)
Updates are in ph note 10/16/17. Appeal letter was faxed to [facility] and they faxed it to [place].

## 2018-01-01 NOTE — Telephone Encounter (Signed)
Copied from [place] [phone]. Topic: Quick Communication - Rx Refill/Question >> Jan 01, 2018  8:43 AM Burchel, Abbi R wrote: Medication: diclofenac (VOLTAREN) 75 MG EC tablet  [id number] [place] [place] [address] Phone: [phone] Fax: [phone]

## 2018-01-01 NOTE — Telephone Encounter (Signed)
Rx has been sent to pharmacy

## 2018-01-01 NOTE — Telephone Encounter (Signed)
Rx refill request that was called into the [facility].   Voltaren 75 mg tablet  [name]'s pt.

## 2018-01-01 NOTE — Telephone Encounter (Signed)
This Rx has been refilled.

## 2018-01-02 DIAGNOSIS — H353221 Exudative age-related macular degeneration, left eye, with active choroidal neovascularization: Secondary | ICD-10-CM | POA: Diagnosis not present

## 2018-01-09 ENCOUNTER — Other Ambulatory Visit: Payer: Self-pay | Admitting: Primary Care

## 2018-01-09 DIAGNOSIS — M8580 Other specified disorders of bone density and structure, unspecified site: Secondary | ICD-10-CM

## 2018-01-09 DIAGNOSIS — Z853 Personal history of malignant neoplasm of breast: Secondary | ICD-10-CM

## 2018-01-09 NOTE — Telephone Encounter (Signed)
Last prescribed on 04/25/2017 Last office visit on 11/28/2017 with Rollene Fare for acute

## 2018-01-10 NOTE — Telephone Encounter (Signed)
Noted, refill sent to pharmacy. 

## 2018-01-16 ENCOUNTER — Encounter: Payer: Self-pay | Admitting: Internal Medicine

## 2018-01-16 ENCOUNTER — Ambulatory Visit (INDEPENDENT_AMBULATORY_CARE_PROVIDER_SITE_OTHER): Payer: Medicare HMO | Admitting: Internal Medicine

## 2018-01-16 VITALS — BP 124/80 | HR 89 | Temp 97.8°F | Wt 138.0 lb

## 2018-01-16 DIAGNOSIS — Z23 Encounter for immunization: Secondary | ICD-10-CM

## 2018-01-16 DIAGNOSIS — L989 Disorder of the skin and subcutaneous tissue, unspecified: Secondary | ICD-10-CM | POA: Diagnosis not present

## 2018-01-16 NOTE — Patient Instructions (Signed)
Excision of Skin Lesions, Care After Refer to this sheet in the next few weeks. These instructions provide you with information about caring for yourself after your procedure. Your health care provider may also give you more specific instructions. Your treatment has been planned according to current medical practices, but problems sometimes occur. Call your health care provider if you have any problems or questions after your procedure. What can I expect after the procedure? After your procedure, it is common to have pain or discomfort at the excision site. Follow these instructions at home:  Take over-the-counter and prescription medicines only as told by your health care provider.  Follow instructions from your health care provider about: ? How to take care of your excision site. You should keep the site clean, dry, and protected for at least 48 hours. ? When and how you should change your bandage (dressing). ? When you should remove your dressing. ? Removing whatever was used to close your excision site.  Check the excision area every day for signs of infection. Watch for: ? Redness, swelling, or pain. ? Fluid, blood, or pus.  For bleeding, apply gentle but firm pressure to the area using a folded towel for 20 minutes.  Avoid high-impact exercise and activities until the stitches (sutures) are removed or the area heals.  Follow instructions from your health care provider about how to minimize scarring. Avoid sun exposure until the area has healed. Scarring should lessen over time.  Keep all follow-up visits as told by your health care provider. This is important. Contact a health care provider if:  You have a fever.  You have redness, swelling, or pain at the excision site.  You have fluid, blood, or pus coming from the excision site.  You have ongoing bleeding at the excision site.  You have pain that does not improve in 2-3 days after your procedure.  You notice skin  irregularities or changes in sensation. This information is not intended to replace advice given to you by your health care provider. Make sure you discuss any questions you have with your health care provider. Document Released: 07/29/2014 Document Revised: 08/20/2015 Document Reviewed: 04/30/2014 Elsevier Interactive Patient Education  2018 Elsevier Inc.  

## 2018-01-16 NOTE — Addendum Note (Signed)
Addended by: Lurlean Nanny on: 01/16/2018 04:49 PM   Modules accepted: Orders

## 2018-01-16 NOTE — Progress Notes (Signed)
Subjective:    Patient ID: [name], female    DOB: 1941/01/11, 77 y.o.   MRN: [id number]  HPI  Pt presents to the clinic today with c/o a skin lesion of her scalp. She reports she noticed this about 1 month ago. It irritates her but it does not itchy or hurt. She reports it will scab up and she will pick the scab off. She does not feel like it has gotten bigger in size. She has not put anything on it.  She also reports a skin lesion and the base of her neck. She reports that has been there 4-5 years but reports it turned into a bump about 1 month ago. The bump has increased in size. It does not itch or hurt. She has tried Kenalog cream on the area with minimal relief. She does have a history of skin cancer.  Review of Systems      Past Medical History:  Diagnosis Date  . Arthritis   . Asthma   . Cancer of breast ([place])    33 years ago, left,   . Hearing loss    both ears, wwears hearing aides  . Hyperlipidemia   . Macular degeneration   . Osteopenia     Current Outpatient Medications  Medication Sig Dispense Refill  . albuterol (PROVENTIL HFA;VENTOLIN HFA) 108 (90 Base) MCG/ACT inhaler Inhale 2 puffs into the lungs every 6 (six) hours as needed for wheezing or shortness of breath.    . calcium carbonate (TUMS) 500 MG chewable tablet Chew 1,000 mg by mouth at bedtime.     . Cholecalciferol (VITAMIN D) 1000 UNITS capsule Take 1,000 Units by mouth daily with breakfast.     . diclofenac (VOLTAREN) 75 MG EC tablet TAKE 1 TABLET BY MOUTH UP TO TWICE DAILY AS NEEDED FOR PAIN. 180 tablet 0  . fluticasone (FLONASE) 50 MCG/ACT nasal spray Place 1 spray into both nostrils 2 (two) times daily.    Marland Kitchen levocetirizine (XYZAL) 5 MG tablet TAKE 1 TABLET BY MOUTH EVERY DAY IN THE EVENING 90 tablet 1  . montelukast (SINGULAIR) 10 MG tablet TAKE 1 TABLET BY MOUTH EVERYDAY AT BEDTIME 90 tablet 3  . Multiple Vitamins-Minerals (MULTI FOR HER 50+) TABS Take 1 tablet by mouth daily.    . Multiple  Vitamins-Minerals (PRESERVISION/LUTEIN) CAPS Take 1 capsule by mouth 2 (two) times daily.    . raloxifene (EVISTA) 60 MG tablet TAKE 1 TABLET (60 MG TOTAL) BY MOUTH DAILY WITH BREAKFAST. 90 tablet 1  . SYMBICORT 160-4.5 MCG/ACT inhaler INHALE 2 PUFFS INTO THE LUNGS TWO TIMES DAILY 10.2 Inhaler 5   No current facility-administered medications for this visit.     Allergies  Allergen Reactions  . Fosamax [Alendronate Sodium] Nausea Only  . Sulfonamide Derivatives Rash    Family History  Problem Relation Age of Onset  . Macular degeneration Mother   . Heart attack Father     Social History   Socioeconomic History  . Marital status: Married    Spouse name: Not on file  . Number of children: Not on file  . Years of education: Not on file  . Highest education level: Not on file  Occupational History  . Occupation: retired  Scientific laboratory technician  . Financial resource strain: Not on file  . Food insecurity:    Worry: Not on file    Inability: Not on file  . Transportation needs:    Medical: Not on file    Non-medical:  Not on file  Tobacco Use  . Smoking status: Never Smoker  . Smokeless tobacco: Never Used  Substance and Sexual Activity  . Alcohol use: Yes    Comment: glass wine 1x month  . Drug use: No  . Sexual activity: Not on file  Lifestyle  . Physical activity:    Days per week: Not on file    Minutes per session: Not on file  . Stress: Not on file  Relationships  . Social connections:    Talks on phone: Not on file    Gets together: Not on file    Attends religious service: Not on file    Active member of club or organization: Not on file    Attends meetings of clubs or organizations: Not on file    Relationship status: Not on file  . Intimate partner violence:    Fear of current or ex partner: Not on file    Emotionally abused: Not on file    Physically abused: Not on file    Forced sexual activity: Not on file  Other Topics Concern  . Not on file  Social  History Narrative   Married.   2 children, 6 grandchildren.   Retired. Previously was Pharmacist, hospital.    Enjoys participating in church.      Constitutional: Denies fever, malaise, fatigue, headache or abrupt weight changes.  Skin: Pt reports skin lesion of scalp and neck.    No other specific complaints in a complete review of systems (except as listed in HPI above).  Objective:   Physical Exam   BP 124/80   Pulse 89   Temp 97.8 F (36.6 C) (Oral)   Wt 138 lb (62.6 kg)   SpO2 98%   BMI 27.41 kg/m  Wt Readings from Last 3 Encounters:  01/16/18 138 lb (62.6 kg)  11/28/17 135 lb (61.2 kg)  10/24/17 134 lb (60.8 kg)    General: Appears her stated age, well developed, well nourished in NAD. Skin: Irregularly shaped, raised scab noted of top of head. Linear scabbed area noted anterior neck.   BMET    Component Value Date/Time   NA 140 05/31/2017 1009   K 3.6 05/31/2017 1009   CL 106 05/31/2017 1009   CO2 28 05/31/2017 1009   GLUCOSE 84 05/31/2017 1009   BUN 21 05/31/2017 1009   CREATININE 0.63 05/31/2017 1009   CALCIUM 9.6 05/31/2017 1009   GFRNONAA >60 08/31/2014 0528   GFRAA >60 08/31/2014 0528    Lipid Panel     Component Value Date/Time   CHOL 187 05/31/2017 1009   TRIG 172.0 (H) 05/31/2017 1009   HDL 41.10 05/31/2017 1009   CHOLHDL 5 05/31/2017 1009   VLDL 34.4 05/31/2017 1009   LDLCALC 112 (H) 05/31/2017 1009    CBC    Component Value Date/Time   WBC 8.6 06/21/2017 1003   RBC 4.11 06/21/2017 1003   HGB 12.2 06/21/2017 1003   HCT 37.0 06/21/2017 1003   PLT 300.0 06/21/2017 1003   MCV 90.0 06/21/2017 1003   MCH 27.4 08/31/2014 0528   MCHC 33.1 06/21/2017 1003   RDW 13.9 06/21/2017 1003   LYMPHSABS 1.7 06/21/2017 1003   MONOABS 1.0 06/21/2017 1003   EOSABS 0.2 06/21/2017 1003   BASOSABS 0.1 06/21/2017 1003    Hgb A1C No results found for: HGBA1C         Assessment & Plan:   Skin Lesion of Scalp, Neck:  Hard to see what they are  because she keeps picking at the scabs Advised her to avoid doing this Referral placed to derm for further evaluation and treatment  Return precautions discussed Webb Silversmith, NP

## 2018-01-17 DIAGNOSIS — H01003 Unspecified blepharitis right eye, unspecified eyelid: Secondary | ICD-10-CM | POA: Diagnosis not present

## 2018-01-24 ENCOUNTER — Ambulatory Visit: Payer: Medicare HMO | Admitting: Pulmonary Disease

## 2018-01-24 ENCOUNTER — Encounter: Payer: Self-pay | Admitting: Pulmonary Disease

## 2018-01-24 VITALS — BP 114/76 | HR 86 | Ht 59.5 in | Wt 138.0 lb

## 2018-01-24 DIAGNOSIS — J82 Pulmonary eosinophilia, not elsewhere classified: Secondary | ICD-10-CM

## 2018-01-24 DIAGNOSIS — J309 Allergic rhinitis, unspecified: Secondary | ICD-10-CM

## 2018-01-24 DIAGNOSIS — J479 Bronchiectasis, uncomplicated: Secondary | ICD-10-CM

## 2018-01-24 DIAGNOSIS — J4551 Severe persistent asthma with (acute) exacerbation: Secondary | ICD-10-CM | POA: Diagnosis not present

## 2018-01-24 DIAGNOSIS — J8283 Eosinophilic asthma: Secondary | ICD-10-CM

## 2018-01-24 MED ORDER — BENZONATATE 200 MG PO CAPS: 200.0000 mg | ORAL_CAPSULE | Freq: Three times a day (TID) | ORAL | 2 refills | Status: DC | PRN

## 2018-01-24 NOTE — Patient Instructions (Signed)
Tessalon 200 mg pill every 8 hours as needed for dry cough Mucinex 600 mg twice per day as needed for productive cough Will start process to get approval for Berna Bue Can try using albuterol more often when you have chest congestion and cough  Follow up in 6 months

## 2018-01-24 NOTE — Progress Notes (Signed)
[name]  Chief Complaint  Patient presents with  . Follow-up    3 month follow up for history of PNA. Per patient, she been feeling ok since her last visit.     Constitutional:  BP 114/76 (BP Location: Left Arm, Patient Position: Sitting, Cuff Size: Normal)   Pulse 86   Ht 4' 11.5" (1.511 m)   Wt 138 lb (62.6 kg)   SpO2 95%   BMI 27.41 kg/m   Past Medical History:  Breast cancer, HLD, Macular degeneration, Osteopenia  Brief Summary:  [name] is a 77 y.o. female never smoker with asthma.  She had pneumonia with empyema in June 2016.  She was not able to get dupixent approved by insurance.  She did okay through the Fall.  Winter generally is her bad time of year.  She gets a rattle in her chest at times.  Brings up clear sputum.  Not having wheeze recently.  Denies chest pain, fever, hemoptysis, or skin rash.  She used tessalon before and this helped.   Physical Exam:   Appearance - well kempt   ENMT - midline nasal  septum, no oral exudates, no LAN, trachea midline, narrow nasal angle, pale nasal mucosa  Respiratory - normal chest wall, normal respiratory effort, no accessory muscle use, scattered rhonchi at bases that cleared with coughing  CV - s1s2 regular rate and rhythm, no murmurs, no peripheral edema, radial pulses symmetric  GI - soft, non tender, no masses  Lymph - no adenopathy noted in neck and axillary areas  MSK - normal gait  Ext - no cyanosis, clubbing, or joint inflammation noted  Skin - no rashes, lesions, or ulcers  Neuro - normal strength, oriented x 3  Psych - normal mood and affect  Discussion:  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  She has recurrent exacerbations requiring prednisone.  RAST is positive.  She is on multiple controller medications with persistent symptoms.  Will try to get approval for fasenra.   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 - continue symbicort, singulair, flonase, xyzal - prn albuterol - will start paperwork for fasenra - prn tessalon, mucinex - hopefully can avoid another round of ABx and prednisone  Cough from spiriva. - will d/c and monitor off LAMA - if symptoms gets worse, then consider adding incruse or tudorza  Bronchiectasis. - likely related to previous episode of pneumonia - bronchial hygiene   Patient Instructions  Tessalon 200 mg pill every 8 hours as needed for dry cough Mucinex 600 mg twice per day as needed for productive cough Will start process to get approval for Berna Bue Can try using albuterol more often when you have chest congestion and cough  Follow up in 6 months  Time spent 26 minutes  [name], MD [place] Pager: [phone] 01/24/2018, 10:12 AM  Flow Sheet     Pulmonary tests:  Spirometry 12/07/11>>1.31 (71%), FEV1% 66 Exhaled nitric oxide 12/07/11>>8 ppb (normal) PFT 12/05/12 >> FEV1 1.49 (82%), FEV1% 69, FEF 25-75% 1.09 (69%), TLC 4.38 (85%), DLCO 87%, borderline BD response Rt pleural fluid 08/08/14 >> glucose 59, protein <3, LDH 6800, WBC 16,640 (86%N), cytology negative for malignancy Rt pleural fluid 08/28/14 >> glucose 59, LDH 178, protein 3.5, WBC 303 (68%L) RAST 06/21/17 >> dust mites, IgE 14 PFT 08/23/17 >> FEV1 1.21 (73%), FEV1% 65, TLC 4.02 (91%), DLCO 71%, no BD  Chest imaging:  CT  chest 08/02/14 >> RML consolidation, small Rt pleural effusion CT chest 08/09/14 >> Rt lower lobe thin walled cavity, scattered GGO and interlobular septal thickening CT chest 09/18/14 >> mild Rt pleural thickening, small Rt pleural effusion, 4 mm nodule LUL, improved RML consolidation, mild RML BTX, RLL cavity 2.5 cm from 3.1 cm, mild BTX lingula CT chest 10/16/14 >> decreased RLL PNA w/o cavity CT chest 04/27/16 >> 4 mm LUL nodule stable, mild BTX and scarring b/l  Cardiac tests:  Lt heart cath 01/24/12 >> Normal  coronaries, LVEDP 8 mmHg, EF 55% Echo 08/03/14 >> EF 50 to 55%, mild MR   Medications:   Allergies as of 01/24/2018      Reactions   Fosamax [alendronate Sodium] Nausea Only   Sulfonamide Derivatives Rash      Medication List        Accurate as of 01/24/18 10:12 AM. Always use your most recent med list.          albuterol 108 (90 Base) MCG/ACT inhaler Commonly known as:  PROVENTIL HFA;VENTOLIN HFA Inhale 2 puffs into the lungs every 6 (six) hours as needed for wheezing or shortness of breath.   benzonatate 200 MG capsule Commonly known as:  TESSALON Take 1 capsule (200 mg total) by mouth 3 (three) times daily as needed for cough.   diclofenac 75 MG EC tablet Commonly known as:  VOLTAREN TAKE 1 TABLET BY MOUTH UP TO TWICE DAILY AS NEEDED FOR PAIN.   fluticasone 50 MCG/ACT nasal spray Commonly known as:  FLONASE Place 1 spray into both nostrils 2 (two) times daily.   levocetirizine 5 MG tablet Commonly known as:  XYZAL TAKE 1 TABLET BY MOUTH EVERY DAY IN THE EVENING   montelukast 10 MG tablet Commonly known as:  SINGULAIR TAKE 1 TABLET BY MOUTH EVERYDAY AT BEDTIME   PRESERVISION/LUTEIN Caps Take 1 capsule by mouth 2 (two) times daily.   MULTI FOR HER 50+ Tabs Take 1 tablet by mouth daily.   raloxifene 60 MG tablet Commonly known as:  EVISTA TAKE 1 TABLET (60 MG TOTAL) BY MOUTH DAILY WITH BREAKFAST.   SYMBICORT 160-4.5 MCG/ACT inhaler Generic drug:  budesonide-formoterol INHALE 2 PUFFS INTO THE LUNGS TWO TIMES DAILY   TUMS 500 MG chewable tablet Generic drug:  calcium carbonate Chew 1,000 mg by mouth at bedtime.   Vitamin D 1000 units capsule Take 1,000 Units by mouth daily with breakfast.       Past Surgical History:  She  has a past surgical history that includes Abdominal hysterectomy; Cesarean section; Breast enhancement surgery; left heart catheterization with coronary angiogram (N/A, 01/24/2012); Mastectomy (33 yrs ago); Eye surgery (Bilateral,  march 2015 and june 2015); and Colonoscopy with propofol (N/A, 04/21/2014).  Family History:  Her family history includes Heart attack in her father; Macular degeneration in her mother.  Social History:  She  reports that she has never smoked. She has never used smokeless tobacco. She reports that she drinks alcohol. She reports that she does not use drugs.

## 2018-01-30 ENCOUNTER — Telehealth: Payer: Self-pay | Admitting: Pulmonary Disease

## 2018-01-30 NOTE — Telephone Encounter (Signed)
Called [employer] they said [employer] hadn't sent in appeal but they did, because it was denied again. [employer] can't approve Dupixent until VS can prove Arnuity Ellipta,Flovent Diskus, Bero Ellipta and Serevent Diskus will not work for pt.. Or tried and failed these meds, [employer] ( Appeals) will not approve it. These meds are the next step before Dupixent according to [facility]. (ins. Co[facility]  I called [employer] to let them know The appeal had been denied. Shella Maxim, Lelan Pons was on another line.) I had a call into Dupixent's reimbursement rep. About their denial program. Routing to VS to make him aware.

## 2018-01-30 NOTE — Telephone Encounter (Signed)
Based on d/w pt at office visit on 01/24/18 we have decided to forego dupixent and will instead try to get approval for fasenra.

## 2018-01-30 NOTE — Telephone Encounter (Signed)
[employer] has all pt's clinical info. This last time I sent VS's letter along with clinical info. and they denied it again. I called Bonita Quin we have a direct connect now. I haven't heard from them.  I put in a call to [place] our reimbursement rep., I lm for her tcb.. Waiting for her tcb, will call [employer] back.

## 2018-01-30 NOTE — Telephone Encounter (Signed)
[place] is calling about the appeal for Dupixent and want's to know the status.   [phone]

## 2018-01-31 DIAGNOSIS — L820 Inflamed seborrheic keratosis: Secondary | ICD-10-CM | POA: Diagnosis not present

## 2018-01-31 DIAGNOSIS — D225 Melanocytic nevi of trunk: Secondary | ICD-10-CM | POA: Diagnosis not present

## 2018-01-31 DIAGNOSIS — C4441 Basal cell carcinoma of skin of scalp and neck: Secondary | ICD-10-CM | POA: Diagnosis not present

## 2018-01-31 DIAGNOSIS — L821 Other seborrheic keratosis: Secondary | ICD-10-CM | POA: Diagnosis not present

## 2018-01-31 NOTE — Telephone Encounter (Signed)
Paperwork on [name] is completed by patient, and hand delivered to Tammy S in injection rm. Waiting on approval for Fasenra.

## 2018-01-31 NOTE — Telephone Encounter (Signed)
I filled out [place] paperwork,  just need VS signature. I put the enrollment form on his desktop keyboard. Waiting.

## 2018-01-31 NOTE — Telephone Encounter (Signed)
VS please advise once completed, thank you.

## 2018-02-01 NOTE — Telephone Encounter (Signed)
TS did you receive this paperwork?

## 2018-02-01 NOTE — Telephone Encounter (Signed)
I signed this earlier this week.  It should either be in my office, or Vida Roller has already picked it up.

## 2018-02-01 NOTE — Telephone Encounter (Signed)
Gave paperwork to TS on 01/31/18 TS please advise if anything else is needed at this time.

## 2018-02-01 NOTE — Telephone Encounter (Signed)
Enrollment form and clinical notes were faxed, waiting on summary of benefits.

## 2018-02-05 NOTE — Telephone Encounter (Signed)
I received pt's summary of benefits P/A. I filled out the P/A and gave it to [facility], I asked her please ask VS to sign this when he gets a chance. Then I can fax the form to [facility].  Waiting on signature.

## 2018-02-06 ENCOUNTER — Telehealth: Payer: Self-pay | Admitting: Pulmonary Disease

## 2018-02-06 NOTE — Telephone Encounter (Signed)
VS signed form, I sent P/A to [facility]. Waiting on determination.

## 2018-02-06 NOTE — Telephone Encounter (Signed)
Called and spoke to patient, patient states she has been prescribed Berna Bue, she states she looked up the reasons for the medication as well as symptoms and she does not think she should be taking it. She states they will be delivering the medication Thursday and she will not start it until she speaks with VS. Informed patient that VS is not back in office until the 19th. Patient voices understanding.   VS advise. Patient would like a call from you directly.

## 2018-02-13 DIAGNOSIS — H353221 Exudative age-related macular degeneration, left eye, with active choroidal neovascularization: Secondary | ICD-10-CM | POA: Diagnosis not present

## 2018-02-14 NOTE — Telephone Encounter (Signed)
FYI pt has been approved for Dupixent, finally! Pt is also aapproved for co-pay asst..

## 2018-02-14 NOTE — Telephone Encounter (Signed)
Routing message to TS for any determination update at this time.

## 2018-02-14 NOTE — Telephone Encounter (Signed)
Dr. Halford Chessman, please advise if you have been able to contact pt as she is wanting you to call her directly. Thanks!

## 2018-02-16 NOTE — Telephone Encounter (Signed)
Spoke with patient.  Explained plan of care for her asthma.  She is agreeable to proceed with dupixent.  As such there will be no need to pursue fasenra.

## 2018-02-16 NOTE — Telephone Encounter (Signed)
I'll will proceed with Dupixent. Thank you!

## 2018-02-19 ENCOUNTER — Encounter: Payer: Self-pay | Admitting: Internal Medicine

## 2018-02-19 ENCOUNTER — Telehealth: Payer: Self-pay

## 2018-02-19 ENCOUNTER — Ambulatory Visit (INDEPENDENT_AMBULATORY_CARE_PROVIDER_SITE_OTHER)
Admission: RE | Admit: 2018-02-19 | Discharge: 2018-02-19 | Disposition: A | Discharge status: Home / Self Care | Payer: Medicare HMO | Source: Ambulatory Visit | Attending: Internal Medicine | Admitting: Internal Medicine

## 2018-02-19 ENCOUNTER — Ambulatory Visit (INDEPENDENT_AMBULATORY_CARE_PROVIDER_SITE_OTHER): Payer: Medicare HMO | Admitting: Internal Medicine

## 2018-02-19 VITALS — BP 120/78 | HR 95 | Temp 98.0°F | Ht 59.5 in | Wt 137.0 lb

## 2018-02-19 DIAGNOSIS — J181 Lobar pneumonia, unspecified organism: Secondary | ICD-10-CM | POA: Diagnosis not present

## 2018-02-19 DIAGNOSIS — R05 Cough: Secondary | ICD-10-CM | POA: Diagnosis not present

## 2018-02-19 DIAGNOSIS — R059 Cough, unspecified: Secondary | ICD-10-CM | POA: Insufficient documentation

## 2018-02-19 HISTORY — DX: Lobar pneumonia, unspecified organism: J18.1

## 2018-02-19 MED ORDER — AZITHROMYCIN 250 MG PO TABS: ORAL_TABLET | ORAL | 0 refills | Status: DC

## 2018-02-19 NOTE — Telephone Encounter (Signed)
[facility] faxed note for pt on 02/19/18 at 7:12 AM pt having trouble breathing, chest pains on rt side, S/T and H/ATemp 101 temp. Pt had cough since 11/22/19and has pain when breathes in. Pt was advised to go to ED but disagreed with advice.  Pt or family called back to [facility] and requested appt at 8:12 AM for weak and tired. Pt was given an appt by [place] to see Dr Silvio Pate 02/19/18 at 11:15. I have tried x 2 to call pt and unable to reach pt by phone. Copy of [facility] note in Dr Alla German in box.

## 2018-02-19 NOTE — Telephone Encounter (Signed)
Pt has an appt today. We can address it then.

## 2018-02-19 NOTE — Progress Notes (Signed)
Subjective:    Patient ID: [name], female    DOB: May 02, 1940, 77 y.o.   MRN: [id number]  HPI Here due to respiratory illness Started with a cold about a week ago--and has been building up Head congestion, sore throat, pain when moving eyes, stuffy ears Really got worse 3 days ago  This morning---now with yellow sputum Had some chest pain----right side-- with the cough (seems better now)  Fever yesterday--- 101.1 Started nyquil last night (2 doses -- 6PM and midnight) No more SOB "than normal" No clear wheezing (she can't hear it due to poor hearing)  Current Outpatient Medications on File Prior to Visit  Medication Sig Dispense Refill  . albuterol (PROVENTIL HFA;VENTOLIN HFA) 108 (90 Base) MCG/ACT inhaler Inhale 2 puffs into the lungs every 6 (six) hours as needed for wheezing or shortness of breath.    . benzonatate (TESSALON) 200 MG capsule Take 1 capsule (200 mg total) by mouth 3 (three) times daily as needed for cough. 30 capsule 2  . calcium carbonate (TUMS) 500 MG chewable tablet Chew 1,000 mg by mouth at bedtime.     . Cholecalciferol (VITAMIN D) 1000 UNITS capsule Take 1,000 Units by mouth daily with breakfast.     . diclofenac (VOLTAREN) 75 MG EC tablet TAKE 1 TABLET BY MOUTH UP TO TWICE DAILY AS NEEDED FOR PAIN. 180 tablet 0  . fluticasone (FLONASE) 50 MCG/ACT nasal spray Place 1 spray into both nostrils 2 (two) times daily.    Marland Kitchen levocetirizine (XYZAL) 5 MG tablet TAKE 1 TABLET BY MOUTH EVERY DAY IN THE EVENING 90 tablet 1  . montelukast (SINGULAIR) 10 MG tablet TAKE 1 TABLET BY MOUTH EVERYDAY AT BEDTIME 90 tablet 3  . Multiple Vitamins-Minerals (MULTI FOR HER 50+) TABS Take 1 tablet by mouth daily.    . Multiple Vitamins-Minerals (PRESERVISION/LUTEIN) CAPS Take 1 capsule by mouth 2 (two) times daily.    . raloxifene (EVISTA) 60 MG tablet TAKE 1 TABLET (60 MG TOTAL) BY MOUTH DAILY WITH BREAKFAST. 90 tablet 1  . SYMBICORT 160-4.5 MCG/ACT inhaler INHALE 2 PUFFS INTO  THE LUNGS TWO TIMES DAILY 10.2 Inhaler 5   No current facility-administered medications on file prior to visit.     Allergies  Allergen Reactions  . Fosamax [Alendronate Sodium] Nausea Only  . Sulfonamide Derivatives Rash    Past Medical History:  Diagnosis Date  . Arthritis   . Asthma   . Cancer of breast ([place])    33 years ago, left,   . Hearing loss    both ears, wwears hearing aides  . Hyperlipidemia   . Macular degeneration   . Osteopenia     Past Surgical History:  Procedure Laterality Date  . ABDOMINAL HYSTERECTOMY    . BREAST ENHANCEMENT SURGERY    . CESAREAN SECTION     x 2  . COLONOSCOPY WITH PROPOFOL N/A 04/21/2014   Procedure: COLONOSCOPY WITH PROPOFOL;  Surgeon: Garlan Fair, MD;  Location: [facility];  Service: Endoscopy;  Laterality: N/A;  . EYE SURGERY Bilateral march 2015 and june 2015   lens for cataracts  . LEFT HEART CATHETERIZATION WITH CORONARY ANGIOGRAM N/A 01/24/2012   Procedure: LEFT HEART CATHETERIZATION WITH CORONARY ANGIOGRAM;  Surgeon: Sueanne Margarita, MD;  Location: [facility];  Service: Cardiovascular;  Laterality: N/A;  . MASTECTOMY  33 yrs ago   bilateral    Family History  Problem Relation Age of Onset  . Macular degeneration Mother   . Heart  attack Father     Social History   Socioeconomic History  . Marital status: Married    Spouse name: Not on file  . Number of children: Not on file  . Years of education: Not on file  . Highest education level: Not on file  Occupational History  . Occupation: retired  Scientific laboratory technician  . Financial resource strain: Not on file  . Food insecurity:    Worry: Not on file    Inability: Not on file  . Transportation needs:    Medical: Not on file    Non-medical: Not on file  Tobacco Use  . Smoking status: Never Smoker  . Smokeless tobacco: Never Used  Substance and Sexual Activity  . Alcohol use: Yes    Comment: glass wine 1x month  . Drug use: No  . Sexual activity: Not on file    Lifestyle  . Physical activity:    Days per week: Not on file    Minutes per session: Not on file  . Stress: Not on file  Relationships  . Social connections:    Talks on phone: Not on file    Gets together: Not on file    Attends religious service: Not on file    Active member of club or organization: Not on file    Attends meetings of clubs or organizations: Not on file    Relationship status: Not on file  . Intimate partner violence:    Fear of current or ex partner: Not on file    Emotionally abused: Not on file    Physically abused: Not on file    Forced sexual activity: Not on file  Other Topics Concern  . Not on file  Social History Narrative   Married.   2 children, 6 grandchildren.   Retired. Previously was Pharmacist, hospital.    Enjoys participating in church.    Review of Systems Body aches yesterday "like the flu" Takes the diclofenac bid but trying to hold off on every day No rash No vomiting or diarrhea Appetite is off    Objective:   Physical Exam  Constitutional: She appears well-developed. No distress.  HENT:  Mouth/Throat: Oropharynx is clear and moist. No oropharyngeal exudate.  No sinus tenderness TMs normal Moderate nasal inflammation  Neck: No thyromegaly present.  Respiratory: Effort normal and breath sounds normal. No respiratory distress. She has no wheezes. She has no rales.  Lymphadenopathy:    She has no cervical adenopathy.           Assessment & Plan:

## 2018-02-19 NOTE — Assessment & Plan Note (Signed)
CXR shows increased RLL findings suggesting acute pneumonia Awaiting radiologist overread--but will treat with azithromycin ER evaluation if worsens analgesics

## 2018-02-19 NOTE — Assessment & Plan Note (Signed)
No evidence of asthma flare Seems to have sinusitis but did have pleuritic chest pain so will check CXR

## 2018-02-20 NOTE — Telephone Encounter (Signed)
Pt apparently had her 1st shipment sent to [place]. (vacationing there prehaps). Picked it up in [place] and brought it home. Pt probably then called [employer] and asked them to ship it to her home.  I lmomtcb and let me know if that is what happened.

## 2018-02-20 NOTE — Telephone Encounter (Signed)
Please refer to note 02/06/18 for updates. Nothing further needed.

## 2018-02-23 NOTE — Telephone Encounter (Signed)
Pt has not called back.

## 2018-02-27 NOTE — Telephone Encounter (Signed)
Please advise if you have spoken with the pt. Thanks.

## 2018-02-27 NOTE — Telephone Encounter (Signed)
Pt is calling back [phone]

## 2018-03-01 ENCOUNTER — Other Ambulatory Visit: Payer: Self-pay | Admitting: Pulmonary Disease

## 2018-03-01 NOTE — Telephone Encounter (Signed)
Tammy, please advise if you have been able to call pt back as she was returning call a couple days ago.

## 2018-03-02 ENCOUNTER — Telehealth: Payer: Self-pay | Admitting: Pulmonary Disease

## 2018-03-02 NOTE — Telephone Encounter (Signed)
rec'd a fax from [place] for pt's Dupixent 300mg  These forms have been given to VS today to complete and sign  VS please advise once completed and signed. Thank you.

## 2018-03-05 DIAGNOSIS — Z85828 Personal history of other malignant neoplasm of skin: Secondary | ICD-10-CM | POA: Diagnosis not present

## 2018-03-05 DIAGNOSIS — L820 Inflamed seborrheic keratosis: Secondary | ICD-10-CM | POA: Diagnosis not present

## 2018-03-05 DIAGNOSIS — Z08 Encounter for follow-up examination after completed treatment for malignant neoplasm: Secondary | ICD-10-CM | POA: Diagnosis not present

## 2018-03-05 NOTE — Telephone Encounter (Signed)
I'll call pt first thing 03/06/18.

## 2018-03-05 NOTE — Telephone Encounter (Signed)
Called and spoke with pt. Pt stated she did receive her first [place] injection 03/03/18 and she was wanting to know if she needed to be seen sooner for an appt than recently stated at pt's last OV due to her starting on the biologic.  At pt's last OV with VS 01/24/18, it states in there for pt to follow up in 6 months but when speaking with pt, she stated she thought she was told to come in sooner for an appt once she did start on the [place].  Dr. Halford Chessman, please advise on this for [place] if pt was needing to schedule a sooner appt than 6 months once she started the [place] or if a 6 month follow up is still what needs to happen.

## 2018-03-05 NOTE — Telephone Encounter (Signed)
Tammy please advise °

## 2018-03-05 NOTE — Telephone Encounter (Signed)
Patient called stating she received first Dupixent injection at home on 03/03/2018.

## 2018-03-05 NOTE — Telephone Encounter (Signed)
Patient is calling to see when she should schedule follow up appointment to see VS or NP.  Recall in system is for 06/2018 but she states she thinks she needs to follow up and sign form after VS receives it.  States she recd her first Dupixent injection on 03/03/18.  CB is [phone]

## 2018-03-07 NOTE — Telephone Encounter (Signed)
Can schedule appointment with me or NP in 2 to 4 weeks.

## 2018-03-07 NOTE — Telephone Encounter (Signed)
Patient has been scheduled for 04/04/18 with Dr. Halford Chessman. Nothing further needed. Will close message.

## 2018-03-08 NOTE — Telephone Encounter (Signed)
Tammy please advise if you have spoken with the pt. Thanks.

## 2018-03-09 NOTE — Telephone Encounter (Signed)
Called Kendra KitchenFYI for VS and Kelli, pt received the 1st inj. At home. A nurse came and explained about Dupixent and how to give herself an inj.Hilary Hertz with her for 2 hrs. And answered any questions the pt had. She's doing fine. Advised pt to call us if she needs [place]. Nothing further needed.  Will route message to VS & Kelli as FYI.

## 2018-03-12 ENCOUNTER — Telehealth: Payer: Self-pay | Admitting: *Deleted

## 2018-03-12 NOTE — Telephone Encounter (Signed)
-----   Message from [name], MD sent at 03/12/2018  8:40 AM EST ----- Regarding: RE: Dupixent Injections That is fine.  This seems to be a growing trend with patients I have on dupixent.  V ----- Message ----- From: Lorane Gell, CMA Sent: 03/09/2018  12:13 PM EST To: Toney Rakes, MD, # Subject: Dupixent Injections                            Hi,    I need to make sure with you guys that VS is okay with patient giving herself the Dupixent Injections at home. Appears the patient took her first dose at home and not in our office. If VS is okay with this please have him dictate in his OV note with patient coming up soon(04-04-18) as well as Staff message me and Alroy Bailiff so we can document in our files as well.   Thanks,  Blair Hailey

## 2018-03-12 NOTE — Telephone Encounter (Signed)
Noted. Nothing further needed. 

## 2018-03-12 NOTE — Telephone Encounter (Signed)
Noted  

## 2018-03-12 NOTE — Telephone Encounter (Signed)
Tammy [name] please take note of this and update records you have on patient. Thanks.

## 2018-03-15 ENCOUNTER — Telehealth: Payer: Self-pay

## 2018-03-15 NOTE — Telephone Encounter (Signed)
-----   Message from [name], MD sent at 03/12/2018  8:40 AM EST ----- Regarding: RE: Dupixent Injections That is fine.  This seems to be a growing trend with patients I have on dupixent.  V ----- Message ----- From: Lorane Gell, CMA Sent: 03/09/2018  12:13 PM EST To: Toney Rakes, MD, # Subject: Dupixent Injections                            Hi,    I need to make sure with you guys that VS is okay with patient giving herself the Dupixent Injections at home. Appears the patient took her first dose at home and not in our office. If VS is okay with this please have him dictate in his OV note with patient coming up soon(04-04-18) as well as Staff message me and Alroy Bailiff so we can document in our files as well.   Thanks,  Blair Hailey

## 2018-04-02 ENCOUNTER — Telehealth: Payer: Self-pay | Admitting: Pulmonary Disease

## 2018-04-02 NOTE — Telephone Encounter (Signed)
I called and gave a verbal rx to a Careers information officer, [facility]. He said he'd take care of it. I will call pt to make her aware. Pt underestood nothing further needed.

## 2018-04-02 NOTE — Telephone Encounter (Signed)
Will route to [employer].for Dupixent.

## 2018-04-04 ENCOUNTER — Ambulatory Visit: Payer: Medicare HMO | Admitting: Pulmonary Disease

## 2018-04-04 ENCOUNTER — Encounter: Payer: Self-pay | Admitting: Pulmonary Disease

## 2018-04-04 VITALS — BP 118/74 | HR 83 | Ht 59.0 in | Wt 137.0 lb

## 2018-04-04 DIAGNOSIS — J82 Pulmonary eosinophilia, not elsewhere classified: Secondary | ICD-10-CM

## 2018-04-04 DIAGNOSIS — J309 Allergic rhinitis, unspecified: Secondary | ICD-10-CM | POA: Diagnosis not present

## 2018-04-04 DIAGNOSIS — H353221 Exudative age-related macular degeneration, left eye, with active choroidal neovascularization: Secondary | ICD-10-CM | POA: Diagnosis not present

## 2018-04-04 DIAGNOSIS — J8283 Eosinophilic asthma: Secondary | ICD-10-CM

## 2018-04-04 NOTE — Patient Instructions (Addendum)
Follow up in 4 months 

## 2018-04-04 NOTE — Progress Notes (Addendum)
[facility]  Chief Complaint  Patient presents with  . Follow-up    Pt has concerns about Dupixent injections at home. Pt feels well overall.    Constitutional:  BP 118/74 (BP Location: Left Arm, Cuff Size: Normal)   Pulse 83   Ht 4\' 11"  (1.499 m)   Wt 137 lb (62.1 kg)   SpO2 98%   BMI 27.67 kg/m   Past Medical History:  Breast cancer, HLD, Macular degeneration, Osteopenia  Brief Summary:  [name] is a 78 y.o. 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  She had pneumonia with empyema in June 2016.  She was finally approved for dupixent.  She feels this has helped.  She has noticed that her breathing is better.  She isn't having trouble in cold air anymore.  She is not having as much cough.  Not having wheeze, chest pain, sinus congestion.  She had bump in her abdomen after injecting at home.  Last for few hours and went away.  Didn't have pain, bruising, bleeding, or warmth.   Physical Exam:   Appearance - well kempt   ENMT - no sinus tenderness, no nasal discharge, no oral exudate  Neck - no masses, trachea midline, no thyromegaly, no elevation in JVP  Respiratory - normal appearance of chest wall, normal respiratory effort w/o accessory muscle use, no dullness on percussion, no tactile fremitus, no wheezing or rales  CV - s1s2 regular rate and rhythm, no murmurs, no peripheral edema, no varicosities, radial pulses symmetric  GI - soft, non tender, no masses, no hepatosplenomegaly  Lymph - no adenopathy noted in neck and axillary areas  MSK - normal gait  Ext - no cyanosis, clubbing, or joint inflammation noted  Skin - no rashes, lesions, or ulcers  Neuro - normal strength, oriented x 3  Psych - normal mood and affect   Assessment/Plan:   Asthma and rhinitis with eosinophilic phenotype. - clinical improvement since starting dupixent >> it is okay for her to continue doing home injections - continue singulair,  xyzal for now - she can try decreasing symbicort to 1 puff bid - prn flonase - prn tessalon, mucinex  Bronchiectasis. - likely related to previous episode of pneumonia - bronchial hygiene   Patient Instructions  Follow up in 4 months    [name], MD [facility] Pager: [phone] 04/04/2018, 9:58 AM  Flow Sheet     Pulmonary tests:  Spirometry 12/07/11>>1.31 (71%), FEV1% 66 Exhaled nitric oxide 12/07/11>>8 ppb (normal) PFT 12/05/12 >> FEV1 1.49 (82%), FEV1% 69, FEF 25-75% 1.09 (69%), TLC 4.38 (85%), DLCO 87%, borderline BD response Rt pleural fluid 08/08/14 >> glucose 59, protein <3, LDH 6800, WBC 16,640 (86%N), cytology negative for malignancy Rt pleural fluid 08/28/14 >> glucose 59, LDH 178, protein 3.5, WBC 303 (68%L) RAST 06/21/17 >> dust mites, IgE 14 PFT 08/23/17 >> FEV1 1.21 (73%), FEV1% 65, TLC 4.02 (91%), DLCO 71%, no BD  Chest imaging:  CT chest 08/02/14 >> RML consolidation, small Rt pleural effusion CT chest 08/09/14 >> Rt lower lobe thin walled cavity, scattered GGO and interlobular septal thickening CT chest 09/18/14 >> mild Rt pleural thickening, small Rt pleural effusion, 4 mm nodule LUL, improved RML consolidation, mild RML BTX, RLL cavity 2.5 cm from 3.1 cm, mild BTX lingula CT chest 10/16/14 >> decreased RLL PNA w/o cavity CT chest 04/27/16 >> 4 mm LUL nodule stable, mild BTX and scarring b/l  Cardiac tests:  Lt heart  cath 01/24/12 >> Normal coronaries, LVEDP 8 mmHg, EF 55% Echo 08/03/14 >> EF 50 to 55%, mild MR   Medications:   Allergies as of 04/04/2018      Reactions   Fosamax [alendronate Sodium] Nausea Only   Sulfonamide Derivatives Rash      Medication List       Accurate as of April 04, 2018  9:58 AM. Always use your most recent med list.        albuterol 108 (90 Base) MCG/ACT inhaler Commonly known as:  PROVENTIL HFA;VENTOLIN HFA Inhale 2 puffs into the lungs every 6 (six) hours as needed for wheezing or shortness of  breath.   benzonatate 200 MG capsule Commonly known as:  TESSALON Take 1 capsule (200 mg total) by mouth 3 (three) times daily as needed for cough.   diclofenac 75 MG EC tablet Commonly known as:  VOLTAREN TAKE 1 TABLET BY MOUTH UP TO TWICE DAILY AS NEEDED FOR PAIN.   DUPIXENT 300 MG/2ML Sosy Generic drug:  Dupilumab INJECT 300MG  (1 SYRINGE) SUBCUTANEOUSLY EVERY OTHER WEEK.   fluticasone 50 MCG/ACT nasal spray Commonly known as:  FLONASE Place 1 spray into both nostrils 2 (two) times daily.   levocetirizine 5 MG tablet Commonly known as:  XYZAL TAKE 1 TABLET BY MOUTH EVERY DAY IN THE EVENING   montelukast 10 MG tablet Commonly known as:  SINGULAIR TAKE 1 TABLET BY MOUTH EVERYDAY AT BEDTIME   PRESERVISION/LUTEIN Caps Take 1 capsule by mouth 2 (two) times daily.   MULTI FOR HER 50+ Tabs Take 1 tablet by mouth daily.   raloxifene 60 MG tablet Commonly known as:  EVISTA TAKE 1 TABLET (60 MG TOTAL) BY MOUTH DAILY WITH BREAKFAST.   SYMBICORT 160-4.5 MCG/ACT inhaler Generic drug:  budesonide-formoterol INHALE 2 PUFFS INTO THE LUNGS TWO TIMES DAILY   TUMS 500 MG chewable tablet Generic drug:  calcium carbonate Chew 1,000 mg by mouth at bedtime.   Vitamin D 1000 units capsule Take 1,000 Units by mouth daily with breakfast.       Past Surgical History:  She  has a past surgical history that includes Abdominal hysterectomy; Cesarean section; Breast enhancement surgery; left heart catheterization with coronary angiogram (N/A, 01/24/2012); Mastectomy (33 yrs ago); Eye surgery (Bilateral, march 2015 and june 2015); and Colonoscopy with propofol (N/A, 04/21/2014).  Family History:  Her family history includes Heart attack in her father; Macular degeneration in her mother.  Social History:  She  reports that she has never smoked. She has never used smokeless tobacco. She reports current alcohol use. She reports that she does not use drugs.

## 2018-04-13 ENCOUNTER — Telehealth: Payer: Self-pay | Admitting: Pulmonary Disease

## 2018-04-13 NOTE — Telephone Encounter (Signed)
Pt states that [employer] called her stating its time to verify her benefits again and have our office all [phone] to do so. I called number given and states the number is not in working order at this time.   Pt was approved for medication assistance in Nov 2019 and is good through 01-2019. Therefore nothing is need by our office or patient at this time. Medications will continue to be shipped to our office

## 2018-05-01 ENCOUNTER — Telehealth: Payer: Self-pay | Admitting: *Deleted

## 2018-05-01 NOTE — Telephone Encounter (Signed)
-----   Message from [name], MD sent at 04/04/2018  5:43 PM EST ----- Regarding: RE: Dupixent Injections I added information in my note that she can do home injections.  V  ----- Message ----- From: Lorane Gell, CMA Sent: 03/09/2018  12:13 PM EST To: Toney Rakes, MD, # Subject: Dupixent Injections                            Hi,    I need to make sure with you guys that VS is okay with patient giving herself the Dupixent Injections at home. Appears the patient took her first dose at home and not in our office. If VS is okay with this please have him dictate in his OV note with patient coming up soon(04-04-18) as well as Staff message me and Alroy Bailiff so we can document in our files as well.   Thanks,  Blair Hailey

## 2018-05-23 DIAGNOSIS — H353221 Exudative age-related macular degeneration, left eye, with active choroidal neovascularization: Secondary | ICD-10-CM | POA: Diagnosis not present

## 2018-06-06 ENCOUNTER — Ambulatory Visit (INDEPENDENT_AMBULATORY_CARE_PROVIDER_SITE_OTHER): Payer: Medicare HMO

## 2018-06-06 ENCOUNTER — Other Ambulatory Visit: Payer: Self-pay

## 2018-06-06 VITALS — BP 134/78 | HR 89 | Temp 97.9°F | Ht 59.5 in | Wt 133.0 lb

## 2018-06-06 DIAGNOSIS — M8580 Other specified disorders of bone density and structure, unspecified site: Secondary | ICD-10-CM | POA: Diagnosis not present

## 2018-06-06 DIAGNOSIS — E782 Mixed hyperlipidemia: Secondary | ICD-10-CM | POA: Diagnosis not present

## 2018-06-06 DIAGNOSIS — Z0000 Encounter for general adult medical examination without abnormal findings: Secondary | ICD-10-CM

## 2018-06-06 DIAGNOSIS — G8929 Other chronic pain: Secondary | ICD-10-CM

## 2018-06-06 DIAGNOSIS — M5442 Lumbago with sciatica, left side: Principal | ICD-10-CM

## 2018-06-06 LAB — CBC WITH DIFFERENTIAL/PLATELET
BASOS ABS: 0.1 10*3/uL (ref 0.0–0.1)
Basophils Relative: 1.2 % (ref 0.0–3.0)
EOS ABS: 0.2 10*3/uL (ref 0.0–0.7)
Eosinophils Relative: 2.1 % (ref 0.0–5.0)
HEMATOCRIT: 39.6 % (ref 36.0–46.0)
Hemoglobin: 13.2 g/dL (ref 12.0–15.0)
LYMPHS PCT: 23.4 % (ref 12.0–46.0)
Lymphs Abs: 2.3 10*3/uL (ref 0.7–4.0)
MCHC: 33.3 g/dL (ref 30.0–36.0)
MCV: 91.6 fl (ref 78.0–100.0)
MONOS PCT: 8.7 % (ref 3.0–12.0)
Monocytes Absolute: 0.9 10*3/uL (ref 0.1–1.0)
NEUTROS PCT: 64.6 % (ref 43.0–77.0)
Neutro Abs: 6.4 10*3/uL (ref 1.4–7.7)
Platelets: 336 10*3/uL (ref 150.0–400.0)
RBC: 4.32 Mil/uL (ref 3.87–5.11)
RDW: 13.6 % (ref 11.5–15.5)
WBC: 9.8 10*3/uL (ref 4.0–10.5)

## 2018-06-06 LAB — LIPID PANEL
CHOLESTEROL: 215 mg/dL — AB (ref 0–200)
HDL: 49.9 mg/dL (ref >39.00)
LDL Cholesterol: 127 mg/dL — ABNORMAL HIGH (ref 0–99)
NonHDL: 164.95
TRIGLYCERIDES: 192 mg/dL — AB (ref 0.0–149.0)
Total CHOL/HDL Ratio: 4
VLDL: 38.4 mg/dL (ref 0.0–40.0)

## 2018-06-06 LAB — COMPREHENSIVE METABOLIC PANEL
ALBUMIN: 4.2 g/dL (ref 3.5–5.2)
ALK PHOS: 61 U/L (ref 39–117)
ALT: 17 U/L (ref 0–35)
AST: 20 U/L (ref 0–37)
BILIRUBIN TOTAL: 0.4 mg/dL (ref 0.2–1.2)
BUN: 20 mg/dL (ref 6–23)
CALCIUM: 9.5 mg/dL (ref 8.4–10.5)
CO2: 29 meq/L (ref 19–32)
CREATININE: 0.67 mg/dL (ref 0.40–1.20)
Chloride: 105 mEq/L (ref 96–112)
GFR: 85.29 mL/min (ref >60.00)
Glucose, Bld: 92 mg/dL (ref 70–99)
Potassium: 4.2 mEq/L (ref 3.5–5.1)
Sodium: 140 mEq/L (ref 135–145)
Total Protein: 7 g/dL (ref 6.0–8.3)

## 2018-06-06 LAB — VITAMIN D 25 HYDROXY (VIT D DEFICIENCY, FRACTURES): VITD: 64.11 ng/mL (ref 30.00–100.00)

## 2018-06-06 MED ORDER — DICLOFENAC SODIUM 75 MG PO TBEC: DELAYED_RELEASE_TABLET | ORAL | 0 refills | Status: DC

## 2018-06-06 NOTE — Progress Notes (Signed)
PCP notes:   Health maintenance:  No gaps identified.   Abnormal screenings:   None  Patient concerns:   Medication refill request for Diclofenac.   Nurse concerns:  None  Next PCP appt:   06/08/18 @ 0940

## 2018-06-06 NOTE — Telephone Encounter (Signed)
Patient in office today for AWV. Requested refill for Diclofenac.

## 2018-06-06 NOTE — Patient Instructions (Addendum)
[name] , Thank you for taking time to come for your Medicare Wellness Visit. I appreciate your ongoing commitment to your health goals. Please review the following plan we discussed and let me know if I can assist you in the future.   These are the goals we discussed: Goals    . Increase physical activity     Starting 06/06/2018, I will continue to do physical therapy exercises for 10 minutes daily.        This is a list of the screening recommended for you and due dates:  Health Maintenance  Topic Date Due  . Tetanus Vaccine  03/29/2019  . Flu Shot  Completed  . DEXA scan (bone density measurement)  Completed  . Pneumonia vaccines  Completed   Preventive Care for Adults  A healthy lifestyle and preventive care can promote health and wellness. Preventive health guidelines for adults include the following key practices.  . A routine yearly physical is a good way to check with your health care provider about your health and preventive screening. It is a chance to share any concerns and updates on your health and to receive a thorough exam.  . Visit your dentist for a routine exam and preventive care every 6 months. Brush your teeth twice a day and floss once a day. Good oral hygiene prevents tooth decay and gum disease.  . The frequency of eye exams is based on your age, health, family medical history, use  of contact lenses, and other factors. Follow your health care provider's recommendations for frequency of eye exams.  . Eat a healthy diet. Foods like vegetables, fruits, whole grains, low-fat dairy products, and lean protein foods contain the nutrients you need without too many calories. Decrease your intake of foods high in solid fats, added sugars, and salt. Eat the right amount of calories for you. Get information about a proper diet from your health care provider, if necessary.  . Regular physical exercise is one of the most important things you can do for your health. Most  adults should get at least 150 minutes of moderate-intensity exercise (any activity that increases your heart rate and causes you to sweat) each week. In addition, most adults need muscle-strengthening exercises on 2 or more days a week.  Silver Sneakers may be a benefit available to you. To determine eligibility, you may visit the website: www.silversneakers.com or contact program at (414) 360-1301 Mon-Fri between 8AM-8PM.   . Maintain a healthy weight. The body mass index (BMI) is a screening tool to identify possible weight problems. It provides an estimate of body fat based on height and weight. Your health care provider can find your BMI and can help you achieve or maintain a healthy weight.   For adults 20 years and older: ? A BMI below 18.5 is considered underweight. ? A BMI of 18.5 to 24.9 is normal. ? A BMI of 25 to 29.9 is considered overweight. ? A BMI of 30 and above is considered obese.   . Maintain normal blood lipids and cholesterol levels by exercising and minimizing your intake of saturated fat. Eat a balanced diet with plenty of fruit and vegetables. Blood tests for lipids and cholesterol should begin at age 12 and be repeated every 5 years. If your lipid or cholesterol levels are high, you are over 50, or you are at high risk for heart disease, you may need your cholesterol levels checked more frequently. Ongoing high lipid and cholesterol levels should be treated with  medicines if diet and exercise are not working.  . If you smoke, find out from your health care provider how to quit. If you do not use tobacco, please do not start.  . If you choose to drink alcohol, please do not consume more than 2 drinks per day. One drink is considered to be 12 ounces (355 mL) of beer, 5 ounces (148 mL) of wine, or 1.5 ounces (44 mL) of liquor.  . If you are 41-14 years old, ask your health care provider if you should take aspirin to prevent strokes.  . Use sunscreen. Apply sunscreen  liberally and repeatedly throughout the day. You should seek shade when your shadow is shorter than you. Protect yourself by wearing long sleeves, pants, a wide-brimmed hat, and sunglasses year round, whenever you are outdoors.  . Once a month, do a whole body skin exam, using a mirror to look at the skin on your back. Tell your health care provider of new moles, moles that have irregular borders, moles that are larger than a pencil eraser, or moles that have changed in shape or color.

## 2018-06-06 NOTE — Progress Notes (Signed)
Subjective:   [name] is a 78 y.o. female who presents for Medicare Annual (Subsequent) preventive examination.  Review of Systems:  N/A Cardiac Risk Factors include: advanced age (>36men, >43 women);dyslipidemia     Objective:     Vitals: BP 134/78 (BP Location: Left Arm, Patient Position: Sitting, Cuff Size: Normal)   Pulse 89   Temp 97.9 F (36.6 C) (Oral)   Ht 4' 11.5" (1.511 m) Comment: no shoes  Wt 133 lb (60.3 kg)   SpO2 97%   BMI 26.41 kg/m   Body mass index is 26.41 kg/m.  Advanced Directives 06/06/2018 05/31/2017 04/14/2015 08/27/2014 08/02/2014 08/02/2014 04/21/2014  Does Patient Have a Medical Advance Directive? No No No No No No No  Does patient want to make changes to medical advance directive? - Yes (MAU/Ambulatory/Procedural Areas - Information given) - - - - -  Would patient like information on creating a medical advance directive? Yes (MAU/Ambulatory/Procedural Areas - Information given) - - No - patient declined information - - No - patient declined information    Tobacco Social History   Tobacco Use  Smoking Status Never Smoker  Smokeless Tobacco Never Used     Counseling given: No   Clinical Intake:  Pre-visit preparation completed: Yes  Pain : 0-10 Pain Score: 3  Pain Type: Chronic pain Pain Location: Generalized     Nutritional Status: BMI 25 -29 Overweight Nutritional Risks: None Diabetes: No  How often do you need to have someone help you when you read instructions, pamphlets, or other written materials from your doctor or pharmacy?: 1 - Never What is the last grade level you completed in school?: Master degree  Interpreter Needed?: No  Comments: pt lives with spouse Information entered by :: LPinson, LPN  Past Medical History:  Diagnosis Date  . Arthritis   . Asthma   . Cancer of breast ([place])    33 years ago, left,   . Hearing loss    both ears, wwears hearing aides  . Hyperlipidemia   . Macular degeneration   .  Osteopenia    Past Surgical History:  Procedure Laterality Date  . ABDOMINAL HYSTERECTOMY    . BREAST ENHANCEMENT SURGERY    . CESAREAN SECTION     x 2  . COLONOSCOPY WITH PROPOFOL N/A 04/21/2014   Procedure: COLONOSCOPY WITH PROPOFOL;  Surgeon: Garlan Fair, MD;  Location: [facility];  Service: Endoscopy;  Laterality: N/A;  . EYE SURGERY Bilateral march 2015 and june 2015   lens for cataracts  . LEFT HEART CATHETERIZATION WITH CORONARY ANGIOGRAM N/A 01/24/2012   Procedure: LEFT HEART CATHETERIZATION WITH CORONARY ANGIOGRAM;  Surgeon: Sueanne Margarita, MD;  Location: [facility];  Service: Cardiovascular;  Laterality: N/A;  . MASTECTOMY  33 yrs ago   bilateral   Family History  Problem Relation Age of Onset  . Macular degeneration Mother   . Heart attack Father    Social History   Socioeconomic History  . Marital status: Married    Spouse name: Not on file  . Number of children: Not on file  . Years of education: Not on file  . Highest education level: Not on file  Occupational History  . Occupation: retired  Scientific laboratory technician  . Financial resource strain: Not on file  . Food insecurity:    Worry: Not on file    Inability: Not on file  . Transportation needs:    Medical: Not on file    Non-medical: Not  on file  Tobacco Use  . Smoking status: Never Smoker  . Smokeless tobacco: Never Used  Substance and Sexual Activity  . Alcohol use: Yes    Comment: glass wine 1x month  . Drug use: No  . Sexual activity: Not Currently  Lifestyle  . Physical activity:    Days per week: Not on file    Minutes per session: Not on file  . Stress: Not on file  Relationships  . Social connections:    Talks on phone: Not on file    Gets together: Not on file    Attends religious service: Not on file    Active member of club or organization: Not on file    Attends meetings of clubs or organizations: Not on file    Relationship status: Not on file  Other Topics Concern  . Not on file   Social History Narrative   Married.   2 children, 6 grandchildren.   Retired. Previously was Pharmacist, hospital.    Enjoys participating in church.     Outpatient Encounter Medications as of 06/06/2018  Medication Sig  . albuterol (PROVENTIL HFA;VENTOLIN HFA) 108 (90 Base) MCG/ACT inhaler Inhale 2 puffs into the lungs every 6 (six) hours as needed for wheezing or shortness of breath.  . benzonatate (TESSALON) 200 MG capsule Take 1 capsule (200 mg total) by mouth 3 (three) times daily as needed for cough.  . calcium carbonate (TUMS) 500 MG chewable tablet Chew 1,000 mg by mouth at bedtime.   . Cholecalciferol (VITAMIN D) 1000 UNITS capsule Take 1,000 Units by mouth daily with breakfast.   . diclofenac (VOLTAREN) 75 MG EC tablet TAKE 1 TABLET BY MOUTH UP TO TWICE DAILY AS NEEDED FOR PAIN.  . DUPIXENT 300 MG/2ML SOSY INJECT 300MG  (1 SYRINGE) SUBCUTANEOUSLY EVERY OTHER WEEK.  . fluticasone (FLONASE) 50 MCG/ACT nasal spray Place 1 spray into both nostrils 2 (two) times daily.  Marland Kitchen levocetirizine (XYZAL) 5 MG tablet TAKE 1 TABLET BY MOUTH EVERY DAY IN THE EVENING  . montelukast (SINGULAIR) 10 MG tablet TAKE 1 TABLET BY MOUTH EVERYDAY AT BEDTIME  . Multiple Vitamins-Minerals (MULTI FOR HER 50+) TABS Take 1 tablet by mouth daily.  . Multiple Vitamins-Minerals (PRESERVISION/LUTEIN) CAPS Take 1 capsule by mouth 2 (two) times daily.  . raloxifene (EVISTA) 60 MG tablet TAKE 1 TABLET (60 MG TOTAL) BY MOUTH DAILY WITH BREAKFAST.  . SYMBICORT 160-4.5 MCG/ACT inhaler INHALE 2 PUFFS INTO THE LUNGS TWO TIMES DAILY   No facility-administered encounter medications on file as of 06/06/2018.     Activities of Daily Living In your present state of health, do you have any difficulty performing the following activities: 06/06/2018  Hearing? Y  Vision? Y  Difficulty concentrating or making decisions? N  Walking or climbing stairs? N  Dressing or bathing? N  Doing errands, shopping? N  Preparing Food and eating ? N  Using  the Toilet? N  In the past six months, have you accidently leaked urine? N  Do you have problems with loss of bowel control? N  Managing your Medications? N  Managing your Finances? N  Housekeeping or managing your Housekeeping? N  Some recent data might be hidden    Patient Care Team: Pleas Koch, NP as PCP - General (Internal Medicine)    Assessment:   This is a routine wellness examination for Adiah.  Hearing Screening Comments: Bilateral hearing aids Vision Screening Comments: Vision exam every 5 wks with Dr. Benay Pillow; both wet and dry  macular degeneration  Exercise Activities and Dietary recommendations Current Exercise Habits: Home exercise routine, Type of exercise: Other - see comments(PT exercises), Time (Minutes): 10, Frequency (Times/Week): 7, Weekly Exercise (Minutes/Week): 70, Intensity: Mild, Exercise limited by: None identified  Goals    . Increase physical activity     Starting 06/06/2018, I will continue to do physical therapy exercises for 10 minutes daily.        Fall Risk Fall Risk  06/06/2018 05/31/2017  Falls in the past year? 0 No   Depression Screen PHQ 2/9 Scores 06/06/2018 05/31/2017 10/10/2016  PHQ - 2 Score 0 0 0  PHQ- 9 Score 0 0 -     Cognitive Function MMSE - Mini Mental State Exam 06/06/2018 05/31/2017  Orientation to time 5 5  Orientation to Place 5 5  Registration 3 3  Attention/ Calculation 0 0  Recall 3 3  Language- name 2 objects 0 0  Language- repeat 1 1  Language- follow 3 step command 3 3  Language- read & follow direction 0 0  Write a sentence 0 0  Copy design 0 0  Total score 20 20     PLEASE NOTE: A Mini-Cog screen was completed. Maximum score is 20. A value of 0 denotes this part of Folstein MMSE was not completed or the patient failed this part of the Mini-Cog screening.   Mini-Cog Screening Orientation to Time - Max 5 pts Orientation to Place - Max 5 pts Registration - Max 3 pts Recall - Max 3 pts Language Repeat -  Max 1 pts Language Follow 3 Step Command - Max 3 pts     Immunization History  Administered Date(s) Administered  . Influenza Split 12/07/2011  . Influenza Whole 03/11/2010  . Influenza,inj,Quad PF,6+ Mos 12/26/2013, 12/02/2014, 01/05/2017, 01/16/2018  . Pneumococcal Conjugate-13 12/26/2013  . Pneumococcal Polysaccharide-23 03/28/2004, 01/16/2018  . Tdap 03/28/2009  . Zoster 03/28/2009   Screening Tests Health Maintenance  Topic Date Due  . TETANUS/TDAP  03/29/2019  . INFLUENZA VACCINE  Completed  . DEXA SCAN  Completed  . PNA vac Low Risk Adult  Completed       Plan:     I have personally reviewed, addressed, and noted the following in the patient's chart:  A. Medical and social history B. Use of alcohol, tobacco or illicit drugs  C. Current medications and supplements D. Functional ability and status E.  Nutritional status F.  Physical activity G. Advance directives H. List of other physicians I.  Hospitalizations, surgeries, and ER visits in previous 12 months J.  [place] to include hearing, vision, cognitive, depression L. Referrals and appointments - none  In addition, I have reviewed and discussed with patient certain preventive protocols, quality metrics, and best practice recommendations. A written personalized care plan for preventive services as well as general preventive health recommendations were provided to patient.  See attached scanned questionnaire for additional information.   Signed,   Lindell Noe, MHA, BS, LPN Health Coach

## 2018-06-06 NOTE — Telephone Encounter (Signed)
Noted, refill sent to pharmacy. 

## 2018-06-06 NOTE — Progress Notes (Signed)
I reviewed health advisor's note, was available for consultation, and agree with documentation and plan.  

## 2018-06-08 ENCOUNTER — Other Ambulatory Visit: Payer: Self-pay

## 2018-06-08 ENCOUNTER — Encounter: Payer: Self-pay | Admitting: Primary Care

## 2018-06-08 ENCOUNTER — Ambulatory Visit (INDEPENDENT_AMBULATORY_CARE_PROVIDER_SITE_OTHER): Payer: Medicare HMO | Admitting: Primary Care

## 2018-06-08 VITALS — BP 118/72 | HR 95 | Temp 98.0°F | Ht 59.5 in | Wt 134.8 lb

## 2018-06-08 DIAGNOSIS — J4540 Moderate persistent asthma, uncomplicated: Secondary | ICD-10-CM

## 2018-06-08 DIAGNOSIS — E2839 Other primary ovarian failure: Secondary | ICD-10-CM

## 2018-06-08 DIAGNOSIS — Z0000 Encounter for general adult medical examination without abnormal findings: Secondary | ICD-10-CM

## 2018-06-08 DIAGNOSIS — M5441 Lumbago with sciatica, right side: Secondary | ICD-10-CM | POA: Diagnosis not present

## 2018-06-08 DIAGNOSIS — R05 Cough: Secondary | ICD-10-CM

## 2018-06-08 DIAGNOSIS — E782 Mixed hyperlipidemia: Secondary | ICD-10-CM | POA: Diagnosis not present

## 2018-06-08 DIAGNOSIS — M8580 Other specified disorders of bone density and structure, unspecified site: Secondary | ICD-10-CM | POA: Diagnosis not present

## 2018-06-08 DIAGNOSIS — Z1239 Encounter for other screening for malignant neoplasm of breast: Secondary | ICD-10-CM | POA: Diagnosis not present

## 2018-06-08 DIAGNOSIS — D649 Anemia, unspecified: Secondary | ICD-10-CM

## 2018-06-08 DIAGNOSIS — J309 Allergic rhinitis, unspecified: Secondary | ICD-10-CM

## 2018-06-08 DIAGNOSIS — R053 Chronic cough: Secondary | ICD-10-CM

## 2018-06-08 DIAGNOSIS — G8929 Other chronic pain: Secondary | ICD-10-CM

## 2018-06-08 MED ORDER — MONTELUKAST SODIUM 10 MG PO TABS: ORAL_TABLET | ORAL | 3 refills | Status: DC

## 2018-06-08 MED ORDER — LEVOCETIRIZINE DIHYDROCHLORIDE 5 MG PO TABS: ORAL_TABLET | ORAL | 3 refills | Status: DC

## 2018-06-08 NOTE — Assessment & Plan Note (Signed)
Compliant to Evista, calcium, and vitamin D. Repeat bone density pending. Continue current regimen.

## 2018-06-08 NOTE — Assessment & Plan Note (Signed)
Immunizations UTD. Mammogram and Bone Density due, pending. Colonoscopy UTD, doesn't need additional imaging. Recommended regular exercise, healthy diet. Exam stable. Labs reviewed. Follow up in 1 year for CPE.

## 2018-06-08 NOTE — Assessment & Plan Note (Signed)
Recent CBC unremarkable. Continue to monitor.  

## 2018-06-08 NOTE — Assessment & Plan Note (Signed)
Doing well in [place] since initiated. Continue Symbicort, Singulair, Xyzal. Refills provided today. Following with pulmonology.

## 2018-06-08 NOTE — Patient Instructions (Signed)
Start exercising. You should be getting 150 minutes of exercise weekly.  It's important to improve your diet by reducing consumption of fast food, fried food, processed snack foods, sugary drinks. Increase consumption of fresh vegetables and fruits, whole grains, water.  Ensure you are drinking 64 ounces of water daily.  Follow up with the dermatologist as discussed.  It was a pleasure to see you today!   Preventive Care 78 Years and Older, Female Preventive care refers to lifestyle choices and visits with your health care provider that can promote health and wellness. What does preventive care include?  A yearly physical exam. This is also called an annual well check.  Dental exams once or twice a year.  Routine eye exams. Ask your health care provider how often you should have your eyes checked.  Personal lifestyle choices, including: ? Daily care of your teeth and gums. ? Regular physical activity. ? Eating a healthy diet. ? Avoiding tobacco and drug use. ? Limiting alcohol use. ? Practicing safe sex. ? Taking low-dose aspirin every day. ? Taking vitamin and mineral supplements as recommended by your health care provider. What happens during an annual well check? The services and screenings done by your health care provider during your annual well check will depend on your age, overall health, lifestyle risk factors, and family history of disease. Counseling Your health care provider may ask you questions about your:  Alcohol use.  Tobacco use.  Drug use.  Emotional well-being.  Home and relationship well-being.  Sexual activity.  Eating habits.  History of falls.  Memory and ability to understand (cognition).  Work and work Statistician.  Reproductive health.  Screening You may have the following tests or measurements:  Height, weight, and BMI.  Blood pressure.  Lipid and cholesterol levels. These may be checked every 5 years, or more frequently if  you are over 49 years old.  Skin check.  Lung cancer screening. You may have this screening every year starting at age 62 if you have a 30-pack-year history of smoking and currently smoke or have quit within the past 15 years.  Colorectal cancer screening. All adults should have this screening starting at age 18 and continuing until age 20. You will have tests every 1-10 years, depending on your results and the type of screening test. People at increased risk should start screening at an earlier age. Screening tests may include: ? Guaiac-based fecal occult blood testing. ? Fecal immunochemical test (FIT). ? Stool DNA test. ? Virtual colonoscopy. ? Sigmoidoscopy. During this test, a flexible tube with a tiny camera (sigmoidoscope) is used to examine your rectum and lower colon. The sigmoidoscope is inserted through your anus into your rectum and lower colon. ? Colonoscopy. During this test, a long, thin, flexible tube with a tiny camera (colonoscope) is used to examine your entire colon and rectum.  Hepatitis C blood test.  Hepatitis B blood test.  Sexually transmitted disease (STD) testing.  Diabetes screening. This is done by checking your blood sugar (glucose) after you have not eaten for a while (fasting). You may have this done every 1-3 years.  Bone density scan. This is done to screen for osteoporosis. You may have this done starting at age 62.  Mammogram. This may be done every 1-2 years. Talk to your health care provider about how often you should have regular mammograms. Talk with your health care provider about your test results, treatment options, and if necessary, the need for more tests. Vaccines Your  health care provider may recommend certain vaccines, such as:  Influenza vaccine. This is recommended every year.  Tetanus, diphtheria, and acellular pertussis (Tdap, Td) vaccine. You may need a Td booster every 10 years.  Varicella vaccine. You may need this if you have  not been vaccinated.  Zoster vaccine. You may need this after age 69.  Measles, mumps, and rubella (MMR) vaccine. You may need at least one dose of MMR if you were born in 1957 or later. You may also need a second dose.  Pneumococcal 13-valent conjugate (PCV13) vaccine. One dose is recommended after age 13.  Pneumococcal polysaccharide (PPSV23) vaccine. One dose is recommended after age 37.  Meningococcal vaccine. You may need this if you have certain conditions.  Hepatitis A vaccine. You may need this if you have certain conditions or if you travel or work in places where you may be exposed to hepatitis A.  Hepatitis B vaccine. You may need this if you have certain conditions or if you travel or work in places where you may be exposed to hepatitis B.  Haemophilus influenzae type b (Hib) vaccine. You may need this if you have certain conditions. Talk to your health care provider about which screenings and vaccines you need and how often you need them. This information is not intended to replace advice given to you by your health care provider. Make sure you discuss any questions you have with your health care provider. Document Released: 04/10/2015 Document Revised: 05/04/2017 Document Reviewed: 01/13/2015 Elsevier Interactive Patient Education  2019 [employer].

## 2018-06-08 NOTE — Assessment & Plan Note (Signed)
Doing much better on Duxipent. Continue current regimen.

## 2018-06-08 NOTE — Assessment & Plan Note (Signed)
Using diclofenac once daily, sometimes twice daily.  Continue stretching. Recommended to work on regular exercise and movement.

## 2018-06-08 NOTE — Progress Notes (Signed)
Subjective:    Patient ID: [name], female    DOB: May 11, 1940, 77 y.o.   MRN: [id number]  HPI  [name] is a 78 year old female who presents today for complete physical.  Immunizations: -Tetanus: Completed in 2011 -Influenza: Completed this season 2019 -Pneumonia: Completed in 2019 -Shingles: Completed Zostavax in 2011  Diet: She endorses a healthy diet Breakfast: Fruit, cereal  Lunch: Meat, vegetable Dinner: Meat, vegetable  Snacks: Ice cream Desserts: Daily  Beverages: Water, juice, semi-sweet tea  Exercise: She is not exercising, started walking recently, is stretching Eye exam: Completes regularly due to macular degeneration Dental exam: Follows regularly Colonoscopy: Completed in 2016, done Dexa: Completed in 2018, osteopenia. On Evista now, on Fosamax until 1999. Mammogram: No recent exam.    Review of Systems  Constitutional: Negative for unexpected weight change.  HENT: Negative for rhinorrhea.   Respiratory: Negative for cough and shortness of breath.   Cardiovascular: Negative for chest pain.  Gastrointestinal: Negative for constipation and diarrhea.  Genitourinary: Negative for difficulty urinating.  Musculoskeletal: Positive for arthralgias and back pain.  Skin: Negative for rash.  Allergic/Immunologic: Positive for environmental allergies.  Neurological: Negative for dizziness and headaches.  Psychiatric/Behavioral: The patient is not nervous/anxious.        Past Medical History:  Diagnosis Date  . Arthritis   . Asthma   . Cancer of breast ([place])    33 years ago, left,   . Hearing loss    both ears, wwears hearing aides  . Hyperlipidemia   . Macular degeneration   . Osteopenia   . Pneumonia      Social History   Socioeconomic History  . Marital status: Married    Spouse name: Not on file  . Number of children: Not on file  . Years of education: Not on file  . Highest education level: Not on file  Occupational History  .  Occupation: retired  Scientific laboratory technician  . Financial resource strain: Not on file  . Food insecurity:    Worry: Not on file    Inability: Not on file  . Transportation needs:    Medical: Not on file    Non-medical: Not on file  Tobacco Use  . Smoking status: Never Smoker  . Smokeless tobacco: Never Used  Substance and Sexual Activity  . Alcohol use: Yes    Comment: glass wine 1x month  . Drug use: No  . Sexual activity: Not Currently  Lifestyle  . Physical activity:    Days per week: Not on file    Minutes per session: Not on file  . Stress: Not on file  Relationships  . Social connections:    Talks on phone: Not on file    Gets together: Not on file    Attends religious service: Not on file    Active member of club or organization: Not on file    Attends meetings of clubs or organizations: Not on file    Relationship status: Not on file  . Intimate partner violence:    Fear of current or ex partner: Not on file    Emotionally abused: Not on file    Physically abused: Not on file    Forced sexual activity: Not on file  Other Topics Concern  . Not on file  Social History Narrative   Married.   2 children, 6 grandchildren.   Retired. Previously was Pharmacist, hospital.    Enjoys participating in church.     Past  Surgical History:  Procedure Laterality Date  . ABDOMINAL HYSTERECTOMY    . BREAST ENHANCEMENT SURGERY    . CESAREAN SECTION     x 2  . COLONOSCOPY WITH PROPOFOL N/A 04/21/2014   Procedure: COLONOSCOPY WITH PROPOFOL;  Surgeon: Garlan Fair, MD;  Location: [facility];  Service: Endoscopy;  Laterality: N/A;  . EYE SURGERY Bilateral march 2015 and june 2015   lens for cataracts  . LEFT HEART CATHETERIZATION WITH CORONARY ANGIOGRAM N/A 01/24/2012   Procedure: LEFT HEART CATHETERIZATION WITH CORONARY ANGIOGRAM;  Surgeon: Sueanne Margarita, MD;  Location: [facility];  Service: Cardiovascular;  Laterality: N/A;  . MASTECTOMY  33 yrs ago   bilateral    Family History   Problem Relation Age of Onset  . Macular degeneration Mother   . Heart attack Father     Allergies  Allergen Reactions  . Fosamax [Alendronate Sodium] Nausea Only  . Sulfonamide Derivatives Rash    Current Outpatient Medications on File Prior to Visit  Medication Sig Dispense Refill  . albuterol (PROVENTIL HFA;VENTOLIN HFA) 108 (90 Base) MCG/ACT inhaler Inhale 2 puffs into the lungs every 6 (six) hours as needed for wheezing or shortness of breath.    . benzonatate (TESSALON) 200 MG capsule Take 1 capsule (200 mg total) by mouth 3 (three) times daily as needed for cough. 30 capsule 2  . calcium carbonate (TUMS) 500 MG chewable tablet Chew 1,000 mg by mouth at bedtime.     . Cholecalciferol (VITAMIN D) 1000 UNITS capsule Take 1,000 Units by mouth daily with breakfast.     . diclofenac (VOLTAREN) 75 MG EC tablet TAKE 1 TABLET BY MOUTH UP TO TWICE DAILY AS NEEDED FOR PAIN. 180 tablet 0  . DUPIXENT 300 MG/2ML SOSY INJECT 300MG  (1 SYRINGE) SUBCUTANEOUSLY EVERY OTHER WEEK. 1 Syringe 6  . fluticasone (FLONASE) 50 MCG/ACT nasal spray Place 1 spray into both nostrils 2 (two) times daily.    . Multiple Vitamins-Minerals (MULTI FOR HER 50+) TABS Take 1 tablet by mouth daily.    . Multiple Vitamins-Minerals (PRESERVISION/LUTEIN) CAPS Take 1 capsule by mouth 2 (two) times daily.    . raloxifene (EVISTA) 60 MG tablet TAKE 1 TABLET (60 MG TOTAL) BY MOUTH DAILY WITH BREAKFAST. 90 tablet 1  . SYMBICORT 160-4.5 MCG/ACT inhaler INHALE 2 PUFFS INTO THE LUNGS TWO TIMES DAILY 10.2 Inhaler 5   No current facility-administered medications on file prior to visit.     BP 118/72   Pulse 95   Temp 98 F (36.7 C) (Oral)   Ht 4' 11.5" (1.511 m)   Wt 134 lb 12 oz (61.1 kg)   SpO2 97%   BMI 26.76 kg/m    Objective:   Physical Exam  Constitutional: She is oriented to person, place, and time. She appears well-nourished.  HENT:  Mouth/Throat: No oropharyngeal exudate.  Eyes: Pupils are equal, round, and  reactive to light. EOM are normal.  Neck: Neck supple. No thyromegaly present.  Cardiovascular: Normal rate and regular rhythm.  Respiratory: Effort normal and breath sounds normal.  GI: Soft. Bowel sounds are normal. There is no abdominal tenderness.  Musculoskeletal: Normal range of motion.  Neurological: She is alert and oriented to person, place, and time.  Skin: Skin is warm and dry.  Psychiatric: She has a normal mood and affect.           Assessment & Plan:

## 2018-06-08 NOTE — Assessment & Plan Note (Signed)
Increased from last year.  Discussed to work on diet and start exercising.  Continue to monitor.

## 2018-06-11 ENCOUNTER — Other Ambulatory Visit: Payer: Self-pay | Admitting: Primary Care

## 2018-06-11 DIAGNOSIS — J4540 Moderate persistent asthma, uncomplicated: Secondary | ICD-10-CM

## 2018-06-21 ENCOUNTER — Ambulatory Visit (INDEPENDENT_AMBULATORY_CARE_PROVIDER_SITE_OTHER): Payer: Medicare HMO | Admitting: Primary Care

## 2018-06-21 ENCOUNTER — Other Ambulatory Visit: Payer: Self-pay

## 2018-06-21 ENCOUNTER — Encounter: Payer: Self-pay | Admitting: Primary Care

## 2018-06-21 ENCOUNTER — Telehealth: Payer: Self-pay

## 2018-06-21 VITALS — BP 122/82 | HR 89 | Temp 98.0°F | Ht 59.5 in | Wt 137.0 lb

## 2018-06-21 DIAGNOSIS — M79605 Pain in left leg: Secondary | ICD-10-CM | POA: Insufficient documentation

## 2018-06-21 DIAGNOSIS — M79604 Pain in right leg: Secondary | ICD-10-CM | POA: Diagnosis not present

## 2018-06-21 HISTORY — DX: Pain in right leg: M79.604

## 2018-06-21 MED ORDER — PREDNISONE 20 MG PO TABS: ORAL_TABLET | ORAL | 0 refills | Status: DC

## 2018-06-21 NOTE — Telephone Encounter (Signed)
Noted and evaluated. 

## 2018-06-21 NOTE — Patient Instructions (Signed)
Start prednisone 20 mg tablets. Take 2 tablets daily for 3 days, then 1 tablet daily for 3 days.  You will be contacted regarding your referral to orthopedics.  Please let us know if you have not been contacted within one week.   Continue stretching as discussed.   It was a pleasure to see you today!

## 2018-06-21 NOTE — Assessment & Plan Note (Signed)
Chronic for 2-3 months.  Could be originating from knee or lower back. Also consider piriformis muscle involvement.  Discussed different options for treatment including medication, PT, ortho evaluation.   Will start with short course of steroids for potential sciatic nerve involvement. Discussed to hold diclofenac. Will also place referral for orthopedic evaluation as she's undergone PT in the past.   She will update in 1-2 weeks.

## 2018-06-21 NOTE — Telephone Encounter (Signed)
For months pt has had pain in upper rt leg. For 2 wks pt as been walking 0.6 of a mile. Starting today the upper rt thigh pain has worsened and is dull pain and pt said at times feels like leg is going to give away. Also 1 hr ago pt started with numbness in rt thumb; no weakness in rt hand or arm; pt said she does have arthritis. No fever, cough or SOB. No travel and no known exposure to covid or flu. There is no known injury and no swelling, redness and no warmth to rt leg. Pt scheduled appt 06/21/18 at 11:20.FYI to Gentry Fitz NP.

## 2018-06-21 NOTE — Progress Notes (Signed)
Subjective:    Patient ID: [name], female    DOB: 10-09-1940, 78 y.o.   MRN: [id number]  HPI  [name] is a 78 year old female with a history of asthma, hyperlipidemia, chronic back pain, obesity who presents today with a chief complaint of lower extremity pain.  Her pain is located to the posterior right lower extremity under buttocks down through her knee, also with anterior thigh pain. She describes her pain as dull. Going up stairs increases the pain to her right anterior thigh.  This morning she was walking in her home, felt her right knee "give out" and she fell. She was able to fall carefully as she grabbed the door knob and lowered herself to the floor. Her right lower extremity has been bothering her for several months.   Her chronic back pain is no worse than before. She's been increasing her walking recently, thinks she's had some knee pain. Denies hip pain, numbness/tingling down the right leg, left lower extremity pain, changes in bowel/bladder control, other trauma, redness, swelling. She has been doing stretching and home PT exercises from prior PT visits for her lower back pain. Last PT visit was about one year ago. She was once seeing orthopedics years ago and was supposed to undergo injection to the lumbar spine but never completed.   Review of Systems  Genitourinary:       Denies changes in bowel/bladder control  Musculoskeletal: Positive for arthralgias and back pain.       Right lower extremity pain  Skin: Negative for color change.  Neurological: Negative for numbness.       Past Medical History:  Diagnosis Date  . Arthritis   . Asthma   . Cancer of breast ([place])    33 years ago, left,   . Hearing loss    both ears, wwears hearing aides  . Hyperlipidemia   . Macular degeneration   . Osteopenia   . Pneumonia      Social History   Socioeconomic History  . Marital status: Married    Spouse name: Not on file  . Number of children: Not on file  .  Years of education: Not on file  . Highest education level: Not on file  Occupational History  . Occupation: retired  Scientific laboratory technician  . Financial resource strain: Not on file  . Food insecurity:    Worry: Not on file    Inability: Not on file  . Transportation needs:    Medical: Not on file    Non-medical: Not on file  Tobacco Use  . Smoking status: Never Smoker  . Smokeless tobacco: Never Used  Substance and Sexual Activity  . Alcohol use: Yes    Comment: glass wine 1x month  . Drug use: No  . Sexual activity: Not Currently  Lifestyle  . Physical activity:    Days per week: Not on file    Minutes per session: Not on file  . Stress: Not on file  Relationships  . Social connections:    Talks on phone: Not on file    Gets together: Not on file    Attends religious service: Not on file    Active member of club or organization: Not on file    Attends meetings of clubs or organizations: Not on file    Relationship status: Not on file  . Intimate partner violence:    Fear of current or ex partner: Not on file  Emotionally abused: Not on file    Physically abused: Not on file    Forced sexual activity: Not on file  Other Topics Concern  . Not on file  Social History Narrative   Married.   2 children, 6 grandchildren.   Retired. Previously was Pharmacist, hospital.    Enjoys participating in church.     Past Surgical History:  Procedure Laterality Date  . ABDOMINAL HYSTERECTOMY    . BREAST ENHANCEMENT SURGERY    . CESAREAN SECTION     x 2  . COLONOSCOPY WITH PROPOFOL N/A 04/21/2014   Procedure: COLONOSCOPY WITH PROPOFOL;  Surgeon: Garlan Fair, MD;  Location: [facility];  Service: Endoscopy;  Laterality: N/A;  . EYE SURGERY Bilateral march 2015 and june 2015   lens for cataracts  . LEFT HEART CATHETERIZATION WITH CORONARY ANGIOGRAM N/A 01/24/2012   Procedure: LEFT HEART CATHETERIZATION WITH CORONARY ANGIOGRAM;  Surgeon: Sueanne Margarita, MD;  Location: [facility];  Service:  Cardiovascular;  Laterality: N/A;  . MASTECTOMY  33 yrs ago   bilateral    Family History  Problem Relation Age of Onset  . Macular degeneration Mother   . Heart attack Father     Allergies  Allergen Reactions  . Fosamax [Alendronate Sodium] Nausea Only  . Sulfonamide Derivatives Rash    Current Outpatient Medications on File Prior to Visit  Medication Sig Dispense Refill  . albuterol (PROVENTIL HFA;VENTOLIN HFA) 108 (90 Base) MCG/ACT inhaler Inhale 2 puffs into the lungs every 6 (six) hours as needed for wheezing or shortness of breath.    . benzonatate (TESSALON) 200 MG capsule Take 1 capsule (200 mg total) by mouth 3 (three) times daily as needed for cough. 30 capsule 2  . calcium carbonate (TUMS) 500 MG chewable tablet Chew 1,000 mg by mouth at bedtime.     . Cholecalciferol (VITAMIN D) 1000 UNITS capsule Take 1,000 Units by mouth daily with breakfast.     . diclofenac (VOLTAREN) 75 MG EC tablet TAKE 1 TABLET BY MOUTH UP TO TWICE DAILY AS NEEDED FOR PAIN. 180 tablet 0  . DUPIXENT 300 MG/2ML SOSY INJECT 300MG  (1 SYRINGE) SUBCUTANEOUSLY EVERY OTHER WEEK. 1 Syringe 6  . fluticasone (FLONASE) 50 MCG/ACT nasal spray Place 1 spray into both nostrils 2 (two) times daily.    Marland Kitchen levocetirizine (XYZAL) 5 MG tablet TAKE 1 TABLET BY MOUTH EVERY DAY IN THE EVENING for allergies. 90 tablet 3  . montelukast (SINGULAIR) 10 MG tablet TAKE 1 TABLET BY MOUTH EVERYDAY AT BEDTIME 90 tablet 3  . Multiple Vitamins-Minerals (MULTI FOR HER 50+) TABS Take 1 tablet by mouth daily.    . Multiple Vitamins-Minerals (PRESERVISION/LUTEIN) CAPS Take 1 capsule by mouth 2 (two) times daily.    . raloxifene (EVISTA) 60 MG tablet TAKE 1 TABLET (60 MG TOTAL) BY MOUTH DAILY WITH BREAKFAST. 90 tablet 1  . SYMBICORT 160-4.5 MCG/ACT inhaler INHALE 2 PUFFS INTO THE LUNGS TWO TIMES DAILY 10.2 Inhaler 5   No current facility-administered medications on file prior to visit.     BP 122/82   Pulse 89   Temp 98 F (36.7 C)  (Oral)   Ht 4' 11.5" (1.511 m)   Wt 137 lb (62.1 kg)   SpO2 97%   BMI 27.21 kg/m    Objective:   Physical Exam  Constitutional: She appears well-nourished.  Cardiovascular: Normal rate.  Respiratory: Effort normal.  Musculoskeletal:       Legs:     Comments: 5/5 strength to  bilateral lower extremities. Walking with limp to right leg in clinic. No swelling, erythema.  Skin: Skin is warm and dry.           Assessment & Plan:

## 2018-06-28 DIAGNOSIS — H353221 Exudative age-related macular degeneration, left eye, with active choroidal neovascularization: Secondary | ICD-10-CM | POA: Diagnosis not present

## 2018-07-11 ENCOUNTER — Other Ambulatory Visit: Payer: Self-pay | Admitting: Primary Care

## 2018-07-11 DIAGNOSIS — Z853 Personal history of malignant neoplasm of breast: Secondary | ICD-10-CM

## 2018-07-11 DIAGNOSIS — M8580 Other specified disorders of bone density and structure, unspecified site: Secondary | ICD-10-CM

## 2018-07-12 ENCOUNTER — Encounter: Payer: Self-pay | Admitting: Pulmonary Disease

## 2018-08-02 DIAGNOSIS — H353221 Exudative age-related macular degeneration, left eye, with active choroidal neovascularization: Secondary | ICD-10-CM | POA: Diagnosis not present

## 2018-08-03 ENCOUNTER — Ambulatory Visit: Payer: Medicare HMO | Admitting: Primary Care

## 2018-08-03 ENCOUNTER — Other Ambulatory Visit: Payer: Self-pay

## 2018-08-03 ENCOUNTER — Encounter: Payer: Self-pay | Admitting: Primary Care

## 2018-08-03 ENCOUNTER — Ambulatory Visit: Payer: Medicare HMO | Admitting: Pulmonary Disease

## 2018-08-03 DIAGNOSIS — J8283 Eosinophilic asthma: Secondary | ICD-10-CM

## 2018-08-03 DIAGNOSIS — J82 Pulmonary eosinophilia, not elsewhere classified: Secondary | ICD-10-CM | POA: Diagnosis not present

## 2018-08-03 NOTE — Assessment & Plan Note (Addendum)
-   Continue Singulair, xyzal and Flonase

## 2018-08-03 NOTE — Progress Notes (Signed)
@Patient  ID: [name], female    DOB: 04/24/1940, 78 y.o.   MRN: [id number]  Chief Complaint  Patient presents with  . Follow-up    feels 'phenomenal" on dupixent.  For about a month she had slight congestion in chest but unable to cough it up.    Referring provider: Pleas Koch, NP  HPI: 78 year old female, never smoked. PMH significant for eosinophilic asthma,bronchiectasis, persistent cough, allergic rhinitis, osteopenia, hyperlipidemia, chronic back pain. Patient of Dr. Halford Chessman, last seen on 04/04/18. Maintained on Dupixent injections q 2 weeks. Symbicort decreased to 1 puff twice daily.   08/03/2018 Patient presents today for 4 month follow-up visit. States that she feels phenomenal since starting dupixent injections in December 2019. She has been giving herself the injections at home every 2 weeks. States that she was noticing a small raised area at the injection site but realized she was withdrawing the needle before all medication had been administered creating a bubble in her skin. She has had no issues since. She also reports some itching at night mainly. It does not keep her awake at night and is not bothersome. There is no pattern to symptoms and it does not happen after injection that she has noticed. No rash to skin or facial involvement.   Testing reviewed: PFT 08/23/17 >> FEV1 1.21 (73%), FEV1% 65, TLC 4.02 (91%), DLCO 71%, no BD Exhaled nitric oxide 12/07/11>>8 ppb (normal) RAST 06/21/17 >> dust mites, IgE 14 CT chest 04/27/16 >> 4 mm LUL nodule stable, mild BTX and scarring b/l   Allergies  Allergen Reactions  . Fosamax [Alendronate Sodium] Nausea Only  . Sulfonamide Derivatives Rash    Immunization History  Administered Date(s) Administered  . Influenza Split 12/07/2011  . Influenza Whole 03/11/2010  . Influenza, High Dose Seasonal PF 11/25/2013  . Influenza,inj,Quad PF,6+ Mos 12/26/2013, 12/02/2014, 01/05/2017, 01/16/2018  . Influenza-Unspecified 12/07/2011   . Pneumococcal Conjugate-13 11/25/2013, 12/26/2013  . Pneumococcal Polysaccharide-23 03/28/2004, 05/10/2006, 01/16/2018  . Td 08/08/1999  . Tdap 03/28/2009, 08/20/2009  . Zoster 03/28/2009, 08/20/2009    Past Medical History:  Diagnosis Date  . Arthritis   . Asthma   . Cancer of breast ([place])    33 years ago, left,   . Hearing loss    both ears, wwears hearing aides  . Hyperlipidemia   . Macular degeneration   . Osteopenia   . Pneumonia     Tobacco History: Social History   Tobacco Use  Smoking Status Never Smoker  Smokeless Tobacco Never Used   Counseling given: Not Answered   Outpatient Medications Prior to Visit  Medication Sig Dispense Refill  . albuterol (PROVENTIL HFA;VENTOLIN HFA) 108 (90 Base) MCG/ACT inhaler Inhale 2 puffs into the lungs every 6 (six) hours as needed for wheezing or shortness of breath.    . benzonatate (TESSALON) 200 MG capsule Take 1 capsule (200 mg total) by mouth 3 (three) times daily as needed for cough. 30 capsule 2  . calcium carbonate (TUMS) 500 MG chewable tablet Chew 1,000 mg by mouth at bedtime.     . Cholecalciferol (VITAMIN D) 1000 UNITS capsule Take 1,000 Units by mouth daily with breakfast.     . diclofenac (VOLTAREN) 75 MG EC tablet TAKE 1 TABLET BY MOUTH UP TO TWICE DAILY AS NEEDED FOR PAIN. 180 tablet 0  . DUPIXENT 300 MG/2ML SOSY INJECT 300MG  (1 SYRINGE) SUBCUTANEOUSLY EVERY OTHER WEEK. 1 Syringe 6  . fluticasone (FLONASE) 50 MCG/ACT nasal spray Place 1  spray into both nostrils 2 (two) times daily.    Marland Kitchen levocetirizine (XYZAL) 5 MG tablet TAKE 1 TABLET BY MOUTH EVERY DAY IN THE EVENING for allergies. 90 tablet 3  . montelukast (SINGULAIR) 10 MG tablet TAKE 1 TABLET BY MOUTH EVERYDAY AT BEDTIME 90 tablet 3  . Multiple Vitamins-Minerals (MULTI FOR HER 50+) TABS Take 1 tablet by mouth daily.    . Multiple Vitamins-Minerals (PRESERVISION/LUTEIN) CAPS Take 1 capsule by mouth 2 (two) times daily.    . predniSONE (DELTASONE) 20 MG  tablet Take 2 tablets for 3 days, then 1 tablet for 3 days. 9 tablet 0  . raloxifene (EVISTA) 60 MG tablet TAKE 1 TABLET (60 MG TOTAL) BY MOUTH DAILY WITH BREAKFAST. 90 tablet 1  . SYMBICORT 160-4.5 MCG/ACT inhaler INHALE 2 PUFFS INTO THE LUNGS TWO TIMES DAILY 10.2 Inhaler 5   No facility-administered medications prior to visit.     Review of Systems  Review of Systems  HENT: Positive for congestion.   Cardiovascular: Negative.    Physical Exam  BP 140/64 (BP Location: Left Arm, Patient Position: Sitting, Cuff Size: Normal)   Pulse 84   Temp 98 F (36.7 C)   Ht 4\' 11"  (1.499 m)   Wt 136 lb 6.4 oz (61.9 kg)   SpO2 97%   BMI 27.55 kg/m  Physical Exam Constitutional:      General: She is not in acute distress.    Appearance: Normal appearance. She is not ill-appearing.  HENT:     Head: Normocephalic and atraumatic.     Mouth/Throat:     Mouth: Mucous membranes are moist.     Pharynx: Oropharynx is clear.  Neck:     Musculoskeletal: Normal range of motion and neck supple.  Cardiovascular:     Rate and Rhythm: Normal rate and regular rhythm.  Pulmonary:     Effort: Pulmonary effort is normal.     Breath sounds: No wheezing or rhonchi.     Comments: CTA Musculoskeletal: Normal range of motion.  Skin:    General: Skin is warm and dry.  Neurological:     General: No focal deficit present.     Mental Status: She is alert and oriented to person, place, and time. Mental status is at baseline.  Psychiatric:        Mood and Affect: Mood normal.        Behavior: Behavior normal.        Thought Content: Thought content normal.        Judgment: Judgment normal.      Lab Results:  CBC    Component Value Date/Time   WBC 9.8 06/06/2018 0911   RBC 4.32 06/06/2018 0911   HGB 13.2 06/06/2018 0911   HCT 39.6 06/06/2018 0911   PLT 336.0 06/06/2018 0911   MCV 91.6 06/06/2018 0911   MCH 27.4 08/31/2014 0528   MCHC 33.3 06/06/2018 0911   RDW 13.6 06/06/2018 0911   LYMPHSABS  2.3 06/06/2018 0911   MONOABS 0.9 06/06/2018 0911   EOSABS 0.2 06/06/2018 0911   BASOSABS 0.1 06/06/2018 0911    BMET    Component Value Date/Time   NA 140 06/06/2018 0911   K 4.2 06/06/2018 0911   CL 105 06/06/2018 0911   CO2 29 06/06/2018 0911   GLUCOSE 92 06/06/2018 0911   BUN 20 06/06/2018 0911   CREATININE 0.67 06/06/2018 0911   CALCIUM 9.5 06/06/2018 0911   GFRNONAA >60 08/31/2014 0528   GFRAA >60 08/31/2014 6378  BNP    Component Value Date/Time   BNP 519.1 (H) 08/05/2014 0007    ProBNP    Component Value Date/Time   PROBNP 133.0 (H) 09/05/2014 1654    Imaging: No results found.   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 Halford Chessman or sooner if needed    Allergic rhinitis - Continue Singulair, xyzal and Flonase    Martyn Ehrich, NP 08/03/2018

## 2018-08-03 NOTE — Patient Instructions (Addendum)
Continue Symbicort inahler 1-2 puffs twice daily   Albuterol rescue inhaler 2 puffs every 4-6 hours as needed only for shortness of breath/wheezing  Continue Dupixent injections every 2 weeks- monitor for worsening inching or rash (notify office)  Follow up in 4-6 months with Dr. Halford Chessman or sooner if needed     Allergies, Adult An allergy means that your body reacts to something that bothers it (allergen). It is not a normal reaction. This can happen from something that you: Eat. Breathe in. Touch. You can have an allergy (be allergic) to: Outdoor things, like: Pollen. Grass. Weeds. Indoor things, like: Dust. Smoke. Pet dander. Foods. Medicines. Things that bother your skin, like: Detergents. Chemicals. Latex. Perfume. Bugs. An allergy cannot spread from person to person (is not contagious). Follow these instructions at home:        Stay away from things that you know you are allergic to. If you have allergies to things in the air, wash out your nose each day. Do it with one of these: A salt-water (saline) spray. A container (neti pot). Take over-the-counter and prescription medicines only as told by your doctor. Keep all follow-up visits as told by your doctor. This is important. If you are at risk for a very bad allergy reaction (anaphylaxis), keep an auto-injector with you all the time. This is called an epinephrine injection. This is pre-measured medicine with a needle. You can put it into your skin by yourself. Right after you have a very bad allergy reaction, you or a person with you must give the medicine in less than a few minutes. This is an emergency. If you have ever had a very bad allergy reaction, wear a medical alert bracelet or necklace. Your very bad allergy should be written on it. Contact a health care provider if: Your symptoms do not get better with treatment. Get help right away if: You have symptoms of a very bad allergy reaction. These  include: A swollen mouth, tongue, or throat. Pain or tightness in your chest. Trouble breathing. Being short of breath. Dizziness. Fainting. Very bad pain in your belly (abdomen). Throwing up (vomiting). Watery poop (diarrhea). Summary An allergy means that your body reacts to something that bothers it (allergen). It is not a normal reaction. Stay away from things that make your body react. Take over-the-counter and prescription medicines only as told by your doctor. If you are at risk for a very bad allergy reaction, carry an auto-injector (epinephrine injection) all the time. Also, wear a medical alert bracelet or necklace so people know about your allergy. This information is not intended to replace advice given to you by your health care provider. Make sure you discuss any questions you have with your health care provider. Document Released: 07/09/2012 Document Revised: 06/27/2016 Document Reviewed: 06/27/2016 Elsevier Interactive Patient Education  2019 [place]. Asthma, Adult  Asthma is a long-term (chronic) condition in which the airways get tight and narrow. The airways are the breathing passages that lead from the nose and mouth down into the lungs. A person with asthma will have times when symptoms get worse. These are called asthma attacks. They can cause coughing, whistling sounds when you breathe (wheezing), shortness of breath, and chest pain. They can make it hard to breathe. There is no cure for asthma, but medicines and lifestyle changes can help control it. There are many things that can bring on an asthma attack or make asthma symptoms worse (triggers). Common triggers include:  Mold.  Dust.  Cigarette smoke.  Cockroaches.  Things that can cause allergy symptoms (allergens). These include animal skin flakes (dander) and pollen from trees or grass.  Things that pollute the air. These may include household cleaners, wood smoke, smog, or chemical odors.  Cold  air, weather changes, and wind.  Crying or laughing hard.  Stress.  Certain medicines or drugs.  Certain foods such as dried fruit, potato chips, and grape juice.  Infections, such as a cold or the flu.  Certain medical conditions or diseases.  Exercise or tiring activities. Asthma may be treated with medicines and by staying away from the things that cause asthma attacks. Types of medicines may include:  Controller medicines. These help prevent asthma symptoms. They are usually taken every day.  Fast-acting reliever or rescue medicines. These quickly relieve asthma symptoms. They are used as needed and provide short-term relief.  Allergy medicines if your attacks are brought on by allergens.  Medicines to help control the body's defense (immune) system. Follow these instructions at home: Avoiding triggers in your home  Change your heating and air conditioning filter often.  Limit your use of fireplaces and wood stoves.  Get rid of pests (such as roaches and mice) and their droppings.  Throw away plants if you see mold on them.  Clean your floors. Dust regularly. Use cleaning products that do not smell.  Have someone vacuum when you are not home. Use a vacuum cleaner with a HEPA filter if possible.  Replace carpet with wood, tile, or vinyl flooring. Carpet can trap animal skin flakes and dust.  Use allergy-proof pillows, mattress covers, and box spring covers.  Wash bed sheets and blankets every week in hot water. Dry them in a dryer.  Keep your bedroom free of any triggers.  Avoid pets and keep windows closed when things that cause allergy symptoms are in the air.  Use blankets that are made of polyester or cotton.  Clean bathrooms and kitchens with bleach. If possible, have someone repaint the walls in these rooms with mold-resistant paint. Keep out of the rooms that are being cleaned and painted.  Wash your hands often with soap and water. If soap and water  are not available, use hand sanitizer.  Do not allow anyone to smoke in your home. General instructions  Take over-the-counter and prescription medicines only as told by your doctor. ? Talk with your doctor if you have questions about how or when to take your medicines. ? Make note if you need to use your medicines more often than usual.  Do not use any products that contain nicotine or tobacco, such as cigarettes and e-cigarettes. If you need help quitting, ask your doctor.  Stay away from secondhand smoke.  Avoid doing things outdoors when allergen counts are high and when air quality is low.  Wear a ski mask when doing outdoor activities in the winter. The mask should cover your nose and mouth. Exercise indoors on cold days if you can.  Warm up before you exercise. Take time to cool down after exercise.  Use a peak flow meter as told by your doctor. A peak flow meter is a tool that measures how well the lungs are working.  Keep track of the peak flow meter's readings. Write them down.  Follow your asthma action plan. This is a written plan for taking care of your asthma and treating your attacks.  Make sure you get all the shots (vaccines) that your doctor recommends. Ask your doctor about  a flu shot and a pneumonia shot.  Keep all follow-up visits as told by your doctor. This is important. Contact a doctor if:  You have wheezing, shortness of breath, or a cough even while taking medicine to prevent attacks.  The mucus you cough up (sputum) is thicker than usual.  The mucus you cough up changes from clear or white to yellow, green, gray, or bloody.  You have problems from the medicine you are taking, such as: ? A rash. ? Itching. ? Swelling. ? Trouble breathing.  You need reliever medicines more than 2-3 times a week.  Your peak flow reading is still at 50-79% of your personal best after following the action plan for 1 hour.  You have a fever. Get help right away  if:  You seem to be worse and are not responding to medicine during an asthma attack.  You are short of breath even at rest.  You get short of breath when doing very little activity.  You have trouble eating, drinking, or talking.  You have chest pain or tightness.  You have a fast heartbeat.  Your lips or fingernails start to turn blue.  You are light-headed or dizzy, or you faint.  Your peak flow is less than 50% of your personal best.  You feel too tired to breathe normally. Summary  Asthma is a long-term (chronic) condition in which the airways get tight and narrow. An asthma attack can make it hard to breathe.  Asthma cannot be cured, but medicines and lifestyle changes can help control it.  Make sure you understand how to avoid triggers and how and when to use your medicines. This information is not intended to replace advice given to you by your health care provider. Make sure you discuss any questions you have with your health care provider. Document Released: 08/31/2007 Document Revised: 04/18/2016 Document Reviewed: 04/18/2016 Elsevier Interactive Patient Education  2019 [place].   Allergies, Adult An allergy means that your body reacts to something that bothers it (allergen). It is not a normal reaction. This can happen from something that you:  Eat.  Breathe in.  Touch. You can have an allergy (be allergic) to:  Outdoor things, like: ? Pollen. ? Grass. ? Weeds.  Indoor things, like: ? Dust. ? Smoke. ? Pet dander.  Foods.  Medicines.  Things that bother your skin, like: ? Detergents. ? Chemicals. ? Latex.  Perfume.  Bugs. An allergy cannot spread from person to person (is not contagious). Follow these instructions at home:         Stay away from things that you know you are allergic to.  If you have allergies to things in the air, wash out your nose each day. Do it with one of these: ? A salt-water (saline) spray. ? A  container (neti pot).  Take over-the-counter and prescription medicines only as told by your doctor.  Keep all follow-up visits as told by your doctor. This is important.  If you are at risk for a very bad allergy reaction (anaphylaxis), keep an auto-injector with you all the time. This is called an epinephrine injection. ? This is pre-measured medicine with a needle. You can put it into your skin by yourself. ? Right after you have a very bad allergy reaction, you or a person with you must give the medicine in less than a few minutes. This is an emergency.  If you have ever had a very bad allergy reaction, wear a medical alert  bracelet or necklace. Your very bad allergy should be written on it. Contact a health care provider if:  Your symptoms do not get better with treatment. Get help right away if:  You have symptoms of a very bad allergy reaction. These include: ? A swollen mouth, tongue, or throat. ? Pain or tightness in your chest. ? Trouble breathing. ? Being short of breath. ? Dizziness. ? Fainting. ? Very bad pain in your belly (abdomen). ? Throwing up (vomiting). ? Watery poop (diarrhea). Summary  An allergy means that your body reacts to something that bothers it (allergen). It is not a normal reaction.  Stay away from things that make your body react.  Take over-the-counter and prescription medicines only as told by your doctor.  If you are at risk for a very bad allergy reaction, carry an auto-injector (epinephrine injection) all the time. Also, wear a medical alert bracelet or necklace so people know about your allergy. This information is not intended to replace advice given to you by your health care provider. Make sure you discuss any questions you have with your health care provider. Document Released: 07/09/2012 Document Revised: 06/27/2016 Document Reviewed: 06/27/2016 Elsevier Interactive Patient Education  2019 [employer].

## 2018-08-03 NOTE — Assessment & Plan Note (Addendum)
-   Significant symptom improvement with Dupixent, ok to continue home injections  - Monitor itching and notify if worsens or develops rash/facial symptoms  - Continue Symbicort 1-2 puffs BID - Albuterol hfa 2 puffs every 4-6 hours prn sob/wheezing  - Advised Mucinex twice daily for chest congestion  - FU in 4-6 months with Dr. Halford Chessman or sooner if needed

## 2018-08-28 ENCOUNTER — Ambulatory Visit
Admission: RE | Admit: 2018-08-28 | Discharge: 2018-08-28 | Disposition: A | Discharge status: Home / Self Care | Payer: Medicare HMO | Source: Ambulatory Visit | Attending: Primary Care | Admitting: Primary Care

## 2018-08-28 ENCOUNTER — Other Ambulatory Visit: Payer: Self-pay | Admitting: Primary Care

## 2018-08-28 ENCOUNTER — Other Ambulatory Visit: Payer: Self-pay

## 2018-08-28 DIAGNOSIS — Z1231 Encounter for screening mammogram for malignant neoplasm of breast: Secondary | ICD-10-CM | POA: Diagnosis not present

## 2018-08-28 DIAGNOSIS — M8589 Other specified disorders of bone density and structure, multiple sites: Secondary | ICD-10-CM | POA: Diagnosis not present

## 2018-08-28 DIAGNOSIS — Z1239 Encounter for other screening for malignant neoplasm of breast: Secondary | ICD-10-CM

## 2018-08-28 DIAGNOSIS — E2839 Other primary ovarian failure: Secondary | ICD-10-CM

## 2018-08-28 DIAGNOSIS — Z853 Personal history of malignant neoplasm of breast: Secondary | ICD-10-CM | POA: Diagnosis not present

## 2018-09-05 DIAGNOSIS — L820 Inflamed seborrheic keratosis: Secondary | ICD-10-CM | POA: Diagnosis not present

## 2018-09-05 DIAGNOSIS — C4441 Basal cell carcinoma of skin of scalp and neck: Secondary | ICD-10-CM | POA: Diagnosis not present

## 2018-09-05 DIAGNOSIS — L218 Other seborrheic dermatitis: Secondary | ICD-10-CM | POA: Diagnosis not present

## 2018-09-06 DIAGNOSIS — H353221 Exudative age-related macular degeneration, left eye, with active choroidal neovascularization: Secondary | ICD-10-CM | POA: Diagnosis not present

## 2018-10-08 DIAGNOSIS — Z85828 Personal history of other malignant neoplasm of skin: Secondary | ICD-10-CM | POA: Diagnosis not present

## 2018-10-08 DIAGNOSIS — L821 Other seborrheic keratosis: Secondary | ICD-10-CM | POA: Diagnosis not present

## 2018-10-08 DIAGNOSIS — L820 Inflamed seborrheic keratosis: Secondary | ICD-10-CM | POA: Diagnosis not present

## 2018-10-08 DIAGNOSIS — Z08 Encounter for follow-up examination after completed treatment for malignant neoplasm: Secondary | ICD-10-CM | POA: Diagnosis not present

## 2018-10-08 DIAGNOSIS — Z1283 Encounter for screening for malignant neoplasm of skin: Secondary | ICD-10-CM | POA: Diagnosis not present

## 2018-10-16 ENCOUNTER — Telehealth: Payer: Self-pay | Admitting: Pulmonary Disease

## 2018-10-16 ENCOUNTER — Other Ambulatory Visit: Payer: Self-pay | Admitting: Pulmonary Disease

## 2018-10-17 MED ORDER — DUPIXENT 300 MG/2ML ~~LOC~~ SOSY: PREFILLED_SYRINGE | SUBCUTANEOUS | 5 refills | Status: DC

## 2018-10-17 NOTE — Telephone Encounter (Signed)
Order sent. Nothing further is needed at this time.

## 2018-10-17 NOTE — Telephone Encounter (Signed)
Call returned to patient, I confirmed that she did request a refill of her Dupixent. I confirmed that she is using 45ml (1 syringe) or 300mg  every two weeks. Confirmed pharmacy. Will route message to app of the day. Order has been pended but we will have to print order and have app sign in absence of VS.   SG please advise if willing to sign order for Dupixent 300mg  every other week.

## 2018-10-17 NOTE — Telephone Encounter (Signed)
As we discussed, send this electronically under Dr. Collie Siad name. Thanks [place].

## 2018-10-18 DIAGNOSIS — H353221 Exudative age-related macular degeneration, left eye, with active choroidal neovascularization: Secondary | ICD-10-CM | POA: Diagnosis not present

## 2018-11-06 NOTE — Progress Notes (Signed)
Reviewed and agree with assessment/plan.   Ignace Mandigo, MD [facility] 03/23/2016, 12:24 PM Pager:  [phone]  

## 2018-11-28 DIAGNOSIS — H353221 Exudative age-related macular degeneration, left eye, with active choroidal neovascularization: Secondary | ICD-10-CM | POA: Diagnosis not present

## 2018-12-03 ENCOUNTER — Other Ambulatory Visit: Payer: Self-pay | Admitting: Primary Care

## 2018-12-03 DIAGNOSIS — G8929 Other chronic pain: Secondary | ICD-10-CM

## 2018-12-17 ENCOUNTER — Telehealth: Payer: Self-pay

## 2018-12-17 NOTE — Telephone Encounter (Signed)
Pt is currently receiving free medication from [facility]. States that [facility] sent her forms to be filled out to renew her patient assistance. There are portions on the form that we are responsible to fill out. Advised the pt to come by the office and we can fill out our portion and give them back to her. She agreed and verbalized understanding. Nothing further is needed at this time.

## 2018-12-19 ENCOUNTER — Telehealth: Payer: Self-pay | Admitting: Pulmonary Disease

## 2018-12-19 NOTE — Telephone Encounter (Signed)
Fax received from [facility] requesting renewal application for patient assistance to be completed and faxed back. Dupixent application office portion completed.  ATC Patient. Left message to call back. Patient needs to complete annual income and sign Dupixent renewal application.

## 2018-12-20 NOTE — Telephone Encounter (Signed)
Patient is returning the call. CB is [phone].

## 2018-12-20 NOTE — Telephone Encounter (Signed)
Called and spoke with Patient. Patient is aware of [place] renewal application.  Patient will stop by [facility] to sign and complete annual income for Dupixent application.

## 2018-12-21 NOTE — Telephone Encounter (Signed)
Dupixent application signed and completed by Patient.  Dupixent application faxed to [facility] for Patient renewal.

## 2018-12-31 ENCOUNTER — Telehealth: Payer: Self-pay | Admitting: Pulmonary Disease

## 2018-12-31 NOTE — Telephone Encounter (Signed)
Noted  

## 2018-12-31 NOTE — Telephone Encounter (Signed)
Forwarding to VS as FYI pt doing well with Dupixent

## 2019-01-01 ENCOUNTER — Other Ambulatory Visit: Payer: Self-pay | Admitting: Primary Care

## 2019-01-01 DIAGNOSIS — J4540 Moderate persistent asthma, uncomplicated: Secondary | ICD-10-CM

## 2019-01-05 ENCOUNTER — Other Ambulatory Visit: Payer: Self-pay | Admitting: Primary Care

## 2019-01-05 DIAGNOSIS — Z853 Personal history of malignant neoplasm of breast: Secondary | ICD-10-CM

## 2019-01-05 DIAGNOSIS — G8929 Other chronic pain: Secondary | ICD-10-CM

## 2019-01-05 DIAGNOSIS — M8580 Other specified disorders of bone density and structure, unspecified site: Secondary | ICD-10-CM

## 2019-01-11 ENCOUNTER — Telehealth: Payer: Self-pay | Admitting: Pulmonary Disease

## 2019-01-11 NOTE — Telephone Encounter (Signed)
Received fax from [facility] requesting a new PA. Called [facility] to initiate PA. Was told that PA was required through [place], [phone]. Called to initiate Dupixent PA for continuing therapy. Patient does her own injections. PA approval/denial will be received within 24-72 hours via fax. Reference # [id number]. Will follow up.

## 2019-01-14 NOTE — Telephone Encounter (Signed)
Fax received from [facility] stating Patient has been approved for [place] until 03/27/20. Dupixent approval letter faxed to [facility]. Patient aware of Dupixent approval. Patient orders and gives her own injections. Nothing further at this time.

## 2019-01-15 ENCOUNTER — Ambulatory Visit (INDEPENDENT_AMBULATORY_CARE_PROVIDER_SITE_OTHER): Payer: Medicare HMO

## 2019-01-15 ENCOUNTER — Other Ambulatory Visit: Payer: Self-pay

## 2019-01-15 DIAGNOSIS — Z23 Encounter for immunization: Secondary | ICD-10-CM | POA: Diagnosis not present

## 2019-01-16 DIAGNOSIS — H353221 Exudative age-related macular degeneration, left eye, with active choroidal neovascularization: Secondary | ICD-10-CM | POA: Diagnosis not present

## 2019-02-04 ENCOUNTER — Telehealth: Payer: Self-pay | Admitting: Pulmonary Disease

## 2019-02-04 NOTE — Telephone Encounter (Signed)
This PA has already been completed and approved through 03/27/2020. Spoke with pt and made her aware of this information.  Called [facility] and spoke with [place]. She states that every thing is in order on their end. [facility] are the ones who needed to speak to [place]. She transferred by call to [place] [facility]. I received a recording stating, "Due to high call volumes you will need to leave a message and a [facility] nurse will return your call." LMTCB x1 for [facility].

## 2019-02-08 NOTE — Telephone Encounter (Signed)
Spoke with Legrand Como with [facility]. He states that they need a copy of the pt's PA approval letter to have on file. This has been faxed to them at [phone].  Spoke with pt. I have made her aware of this information. Nothing further was needed.

## 2019-03-07 DIAGNOSIS — H353221 Exudative age-related macular degeneration, left eye, with active choroidal neovascularization: Secondary | ICD-10-CM | POA: Diagnosis not present

## 2019-03-07 DIAGNOSIS — H26493 Other secondary cataract, bilateral: Secondary | ICD-10-CM | POA: Diagnosis not present

## 2019-03-25 ENCOUNTER — Other Ambulatory Visit: Payer: Self-pay | Admitting: Primary Care

## 2019-03-25 DIAGNOSIS — J4540 Moderate persistent asthma, uncomplicated: Secondary | ICD-10-CM

## 2019-03-25 DIAGNOSIS — M5442 Lumbago with sciatica, left side: Secondary | ICD-10-CM

## 2019-03-25 DIAGNOSIS — J309 Allergic rhinitis, unspecified: Secondary | ICD-10-CM

## 2019-03-25 DIAGNOSIS — G8929 Other chronic pain: Secondary | ICD-10-CM

## 2019-03-26 ENCOUNTER — Telehealth: Payer: Self-pay | Admitting: Pulmonary Disease

## 2019-03-26 NOTE — Telephone Encounter (Signed)
Patient in the lobby waiting regarding forms for Dupixent.  Message sent to [place].

## 2019-03-26 NOTE — Telephone Encounter (Signed)
ATC pt, there was no answer and no option to leave a message. Will try back. 

## 2019-03-26 NOTE — Telephone Encounter (Signed)
Spoke with pt in the lobby. The forms were not anything that we needed to fill out. The pt just wanted to make sure that she filled them out correctly. Nothing further was needed.

## 2019-04-05 ENCOUNTER — Telehealth: Payer: Self-pay | Admitting: Primary Care

## 2019-04-05 NOTE — Telephone Encounter (Signed)
Spoken and notified patient of [name]'s comments. Patient verbalized understanding.  

## 2019-04-05 NOTE — Telephone Encounter (Signed)
I believe she should proceed with the Covid-19 vaccine doses.

## 2019-04-05 NOTE — Telephone Encounter (Signed)
Patient said she has an opportunity to sign up for the covid vaccine.  Patient wants Kate's opinion about her getting the vaccine.  Patient said she's had several bouts of pneumonia, and Asthma.

## 2019-04-10 ENCOUNTER — Telehealth: Payer: Self-pay | Admitting: Pulmonary Disease

## 2019-04-11 NOTE — Telephone Encounter (Signed)
Called [facility] about patient's Dupixent enrollment and approval, spoke with Destiny. Destiny stated Dupixent is in process and PA is required. Explained patient Dupixent was approved 01/2019 through 03/27/2020.  Destiny requested PA approval be faxed again to [facility].  Fax number given [phone].  Dupixent approval letter faxed. Called and updated Patient.  Patient stated her next injection would be due 04/14/19.  Will follow up.

## 2019-04-11 NOTE — Telephone Encounter (Signed)
Pt states that she is still having problems with Dupixent. She is very frustrated and doesn't know what to do. States that her [name] is requiring a PA - please advise pt at [phone] - supposed to have new shot on Sunday/Monday but she doesn't have any med.

## 2019-04-12 NOTE — Telephone Encounter (Signed)
Called [facility] to follow up on patient [name]. Spoke with Kathlee Nations. Kathlee Nations stated everything was in order and Patient should receive Dupixent this weekend. Patient is aware that nothing further is needed at this time.

## 2019-04-15 ENCOUNTER — Telehealth: Payer: Self-pay | Admitting: Pulmonary Disease

## 2019-04-15 NOTE — Telephone Encounter (Signed)
ATC [facility] about Dupixent shipment for Patient. LM for someone to call back.  [facility] and [facility] are closed for [facility] holiday. Called Charles, Dupixent representative and LM on VM to call back regarding Patient medication and shipment. Patient is aware and will keep updated.

## 2019-04-17 ENCOUNTER — Other Ambulatory Visit: Payer: Self-pay | Admitting: Primary Care

## 2019-04-17 DIAGNOSIS — J309 Allergic rhinitis, unspecified: Secondary | ICD-10-CM

## 2019-04-17 NOTE — Telephone Encounter (Signed)
Called and spoke with Threasa Beards, Dupixent Rep.  [facility] is currently behind on approvals for 2021. Threasa Beards stated Patient should receive call from [place] for home delivery. Threasa Beards stated she was having Dupixent samples sent to office and if Patient needed she could come pick Dupixent box up. Called and updated Patient.  Patient stated she would reach out to [facility] for shipment update and would contact office if she needs a sample.

## 2019-04-18 DIAGNOSIS — H353221 Exudative age-related macular degeneration, left eye, with active choroidal neovascularization: Secondary | ICD-10-CM | POA: Diagnosis not present

## 2019-04-19 ENCOUNTER — Telehealth: Payer: Self-pay | Admitting: Pulmonary Disease

## 2019-04-19 ENCOUNTER — Ambulatory Visit (INDEPENDENT_AMBULATORY_CARE_PROVIDER_SITE_OTHER): Payer: Medicare HMO

## 2019-04-19 ENCOUNTER — Other Ambulatory Visit: Payer: Self-pay

## 2019-04-19 DIAGNOSIS — J8283 Eosinophilic asthma: Secondary | ICD-10-CM

## 2019-04-19 MED ORDER — DUPILUMAB 300 MG/2ML ~~LOC~~ SOSY
300.0000 mg | PREFILLED_SYRINGE | Freq: Once | SUBCUTANEOUS | Status: AC
  Administered 2019-04-19: 12:00:00 300 mg via SUBCUTANEOUS

## 2019-04-19 NOTE — Telephone Encounter (Signed)
I called and spoke with the patient after speaking with Lattie Haw who has gotten recommendations from providers and made her aware that it will be fine for her to get her Covid vaccine tomorrow after her dupixent injection today. She verbalized understanding

## 2019-04-19 NOTE — Telephone Encounter (Signed)
Called and spoke with Patient.  Patient Dupixent has not arrived.  Patient does home/self injections.Per Jari Sportsman Representative, [facility] [phone] enrollment is behind. Patient offered Dupixent sample.  Patient scheduled 1130 today for injection. Nothing further at this time.

## 2019-04-19 NOTE — Telephone Encounter (Signed)
Patient came into office today and received dupixent injection.  Nothing further at this time.

## 2019-04-19 NOTE — Progress Notes (Signed)
Have you been hospitalized within the last 10 days?  No Do you have a fever?  No Do you have a cough?  No Do you have a headache or sore throat? No  

## 2019-04-20 ENCOUNTER — Ambulatory Visit: Payer: Medicare HMO

## 2019-04-20 ENCOUNTER — Ambulatory Visit: Payer: Medicare HMO | Attending: Internal Medicine

## 2019-04-20 DIAGNOSIS — Z23 Encounter for immunization: Secondary | ICD-10-CM | POA: Insufficient documentation

## 2019-04-20 NOTE — Progress Notes (Signed)
   Covid-19 Vaccination Clinic  Name:  [name]    MRN: [id number] DOB: 1940/10/18  04/20/2019  [name] was observed post Covid-19 immunization for 15 minutes without incidence. She was provided with Vaccine Information Sheet and instruction to access the V-Safe system.   [name] was instructed to call 911 with any severe reactions post vaccine: Marland Kitchen Difficulty breathing  . Swelling of your face and throat  . A fast heartbeat  . A bad rash all over your body  . Dizziness and weakness    Immunizations Administered    Name Date Dose VIS Date Route   Pfizer COVID-19 Vaccine 04/20/2019  2:33 PM 0.3 mL 03/08/2019 Intramuscular   Manufacturer: [place]   Lot: [id number]   [place]: [id number]

## 2019-05-12 ENCOUNTER — Ambulatory Visit: Payer: Medicare HMO | Attending: Internal Medicine

## 2019-05-12 DIAGNOSIS — Z23 Encounter for immunization: Secondary | ICD-10-CM | POA: Insufficient documentation

## 2019-05-12 NOTE — Progress Notes (Signed)
   Covid-19 Vaccination Clinic  Name:  [name]    MRN: [id number] DOB: 1940/12/26  05/12/2019  [name] was observed post Covid-19 immunization for 15 minutes without incidence. She was provided with Vaccine Information Sheet and instruction to access the V-Safe system.   [name] was instructed to call 911 with any severe reactions post vaccine: Marland Kitchen Difficulty breathing  . Swelling of your face and throat  . A fast heartbeat  . A bad rash all over your body  . Dizziness and weakness    Immunizations Administered    Name Date Dose VIS Date Route   Pfizer COVID-19 Vaccine 05/12/2019 10:45 AM 0.3 mL 03/08/2019 Intramuscular   Manufacturer: [place]   Lot: [id number]   [place]: [id number]

## 2019-05-20 ENCOUNTER — Telehealth: Payer: Self-pay

## 2019-05-20 NOTE — Telephone Encounter (Signed)
COVID vaccines already in pt's chart  Sent mychart msg to let pt know   [place] Night - Client Nonclinical Telephone Record AccessNurse Client [place] Night - Client Client Site [place] Physician Alma Friendly - NP Contact Type Call Who Is Calling Patient / Member / Family / Caregiver Caller Name Marliene Giffen Caller Phone Number [phone] Patient Name [name] Patient DOB 1940/09/17 Call Type Message Only Information Provided Reason for Call Request for General Office Information Initial Comment Caller states she just received her second COVID Vaccine and is wondering if the office needs a copy of it. Additional Comment Provided office hours, caller declined triage. Disp. Time Disposition Final User 05/18/2019 10:08:42 AM General Information Provided Yes Pense, Lori Call Closed By: Gloris Ham Transaction Date/Time: 05/18/2019 10:06:57 AM (ET)

## 2019-05-30 DIAGNOSIS — H353221 Exudative age-related macular degeneration, left eye, with active choroidal neovascularization: Secondary | ICD-10-CM | POA: Diagnosis not present

## 2019-06-07 ENCOUNTER — Other Ambulatory Visit: Payer: Self-pay | Admitting: Primary Care

## 2019-06-07 DIAGNOSIS — D649 Anemia, unspecified: Secondary | ICD-10-CM

## 2019-06-07 DIAGNOSIS — E782 Mixed hyperlipidemia: Secondary | ICD-10-CM

## 2019-06-07 DIAGNOSIS — M8580 Other specified disorders of bone density and structure, unspecified site: Secondary | ICD-10-CM

## 2019-06-10 ENCOUNTER — Ambulatory Visit (INDEPENDENT_AMBULATORY_CARE_PROVIDER_SITE_OTHER): Payer: Medicare HMO

## 2019-06-10 ENCOUNTER — Ambulatory Visit: Payer: Medicare HMO | Admitting: Internal Medicine

## 2019-06-10 ENCOUNTER — Other Ambulatory Visit: Payer: Self-pay

## 2019-06-10 ENCOUNTER — Ambulatory Visit: Payer: Medicare HMO

## 2019-06-10 ENCOUNTER — Other Ambulatory Visit (INDEPENDENT_AMBULATORY_CARE_PROVIDER_SITE_OTHER): Payer: Medicare HMO

## 2019-06-10 ENCOUNTER — Encounter: Payer: Self-pay | Admitting: Internal Medicine

## 2019-06-10 VITALS — BP 122/84 | HR 95 | Temp 98.2°F | Wt 134.0 lb

## 2019-06-10 DIAGNOSIS — H60542 Acute eczematoid otitis externa, left ear: Secondary | ICD-10-CM

## 2019-06-10 DIAGNOSIS — M8580 Other specified disorders of bone density and structure, unspecified site: Secondary | ICD-10-CM

## 2019-06-10 DIAGNOSIS — R238 Other skin changes: Secondary | ICD-10-CM | POA: Diagnosis not present

## 2019-06-10 DIAGNOSIS — D649 Anemia, unspecified: Secondary | ICD-10-CM

## 2019-06-10 DIAGNOSIS — Z0000 Encounter for general adult medical examination without abnormal findings: Secondary | ICD-10-CM

## 2019-06-10 DIAGNOSIS — E782 Mixed hyperlipidemia: Secondary | ICD-10-CM

## 2019-06-10 LAB — CBC
HCT: 38.2 % (ref 36.0–46.0)
Hemoglobin: 12.7 g/dL (ref 12.0–15.0)
MCHC: 33.3 g/dL (ref 30.0–36.0)
MCV: 93.1 fl (ref 78.0–100.0)
Platelets: 350 10*3/uL (ref 150.0–400.0)
RBC: 4.1 Mil/uL (ref 3.87–5.11)
RDW: 14.1 % (ref 11.5–15.5)
WBC: 9.8 10*3/uL (ref 4.0–10.5)

## 2019-06-10 LAB — VITAMIN D 25 HYDROXY (VIT D DEFICIENCY, FRACTURES): VITD: 51.55 ng/mL (ref 30.00–100.00)

## 2019-06-10 LAB — COMPREHENSIVE METABOLIC PANEL
ALT: 18 U/L (ref 0–35)
AST: 21 U/L (ref 0–37)
Albumin: 3.9 g/dL (ref 3.5–5.2)
Alkaline Phosphatase: 54 U/L (ref 39–117)
BUN: 25 mg/dL — ABNORMAL HIGH (ref 6–23)
CO2: 26 mEq/L (ref 19–32)
Calcium: 9.6 mg/dL (ref 8.4–10.5)
Chloride: 107 mEq/L (ref 96–112)
Creatinine, Ser: 0.65 mg/dL (ref 0.40–1.20)
GFR: 88.1 mL/min (ref >60.00)
Glucose, Bld: 80 mg/dL (ref 70–99)
Potassium: 4 mEq/L (ref 3.5–5.1)
Sodium: 142 mEq/L (ref 135–145)
Total Bilirubin: 0.3 mg/dL (ref 0.2–1.2)
Total Protein: 7 g/dL (ref 6.0–8.3)

## 2019-06-10 LAB — LIPID PANEL
Cholesterol: 214 mg/dL — ABNORMAL HIGH (ref 0–200)
HDL: 44.9 mg/dL (ref >39.00)
LDL Cholesterol: 131 mg/dL — ABNORMAL HIGH (ref 0–99)
NonHDL: 169.02
Total CHOL/HDL Ratio: 5
Triglycerides: 188 mg/dL — ABNORMAL HIGH (ref 0.0–149.0)
VLDL: 37.6 mg/dL (ref 0.0–40.0)

## 2019-06-10 NOTE — Patient Instructions (Signed)
[name] , Thank you for taking time to come for your Medicare Wellness Visit. I appreciate your ongoing commitment to your health goals. Please review the following plan we discussed and let me know if I can assist you in the future.   Screening recommendations/referrals: Colonoscopy: Up to date, completed 04/21/2014 Mammogram: Up to date, completed 08/28/2018 Bone Density: Up to date, completed 08/28/2018 Recommended yearly ophthalmology/optometry visit for glaucoma screening and checkup Recommended yearly dental visit for hygiene and checkup  Vaccinations: Influenza vaccine: Up to date, completed 01/15/2019 Pneumococcal vaccine: Completed series Tdap vaccine: Up to date, completed 08/20/2009 Shingles vaccine: discussed    Advanced directives: Advance directive discussed with you today. I have provided a copy for you to complete at home and have notarized. Once this is complete please bring a copy in to our office so we can scan it into your chart.   Conditions/risks identified: hyperlipidemia  Next appointment: 06/14/2019 @ 10:20 am    Preventive Care 65 Years and Older, Female Preventive care refers to lifestyle choices and visits with your health care provider that can promote health and wellness. What does preventive care include?  A yearly physical exam. This is also called an annual well check.  Dental exams once or twice a year.  Routine eye exams. Ask your health care provider how often you should have your eyes checked.  Personal lifestyle choices, including:  Daily care of your teeth and gums.  Regular physical activity.  Eating a healthy diet.  Avoiding tobacco and drug use.  Limiting alcohol use.  Practicing safe sex.  Taking low-dose aspirin every day.  Taking vitamin and mineral supplements as recommended by your health care provider. What happens during an annual well check? The services and screenings done by your health care provider during your annual  well check will depend on your age, overall health, lifestyle risk factors, and family history of disease. Counseling  Your health care provider may ask you questions about your:  Alcohol use.  Tobacco use.  Drug use.  Emotional well-being.  Home and relationship well-being.  Sexual activity.  Eating habits.  History of falls.  Memory and ability to understand (cognition).  Work and work Statistician.  Reproductive health. Screening  You may have the following tests or measurements:  Height, weight, and BMI.  Blood pressure.  Lipid and cholesterol levels. These may be checked every 5 years, or more frequently if you are over 64 years old.  Skin check.  Lung cancer screening. You may have this screening every year starting at age 81 if you have a 30-pack-year history of smoking and currently smoke or have quit within the past 15 years.  Fecal occult blood test (FOBT) of the stool. You may have this test every year starting at age 87.  Flexible sigmoidoscopy or colonoscopy. You may have a sigmoidoscopy every 5 years or a colonoscopy every 10 years starting at age 31.  Hepatitis C blood test.  Hepatitis B blood test.  Sexually transmitted disease (STD) testing.  Diabetes screening. This is done by checking your blood sugar (glucose) after you have not eaten for a while (fasting). You may have this done every 1-3 years.  Bone density scan. This is done to screen for osteoporosis. You may have this done starting at age 61.  Mammogram. This may be done every 1-2 years. Talk to your health care provider about how often you should have regular mammograms. Talk with your health care provider about your test results,  treatment options, and if necessary, the need for more tests. Vaccines  Your health care provider may recommend certain vaccines, such as:  Influenza vaccine. This is recommended every year.  Tetanus, diphtheria, and acellular pertussis (Tdap, Td) vaccine.  You may need a Td booster every 10 years.  Zoster vaccine. You may need this after age 50.  Pneumococcal 13-valent conjugate (PCV13) vaccine. One dose is recommended after age 67.  Pneumococcal polysaccharide (PPSV23) vaccine. One dose is recommended after age 84. Talk to your health care provider about which screenings and vaccines you need and how often you need them. This information is not intended to replace advice given to you by your health care provider. Make sure you discuss any questions you have with your health care provider. Document Released: 04/10/2015 Document Revised: 12/02/2015 Document Reviewed: 01/13/2015 Elsevier Interactive Patient Education  2017 [employer] Prevention in the Home Falls can cause injuries. They can happen to people of all ages. There are many things you can do to make your home safe and to help prevent falls. What can I do on the outside of my home?  Regularly fix the edges of walkways and driveways and fix any cracks.  Remove anything that might make you trip as you walk through a door, such as a raised step or threshold.  Trim any bushes or trees on the path to your home.  Use bright outdoor lighting.  Clear any walking paths of anything that might make someone trip, such as rocks or tools.  Regularly check to see if handrails are loose or broken. Make sure that both sides of any steps have handrails.  Any raised decks and porches should have guardrails on the edges.  Have any leaves, snow, or ice cleared regularly.  Use sand or salt on walking paths during winter.  Clean up any spills in your garage right away. This includes oil or grease spills. What can I do in the bathroom?  Use night lights.  Install grab bars by the toilet and in the tub and shower. Do not use towel bars as grab bars.  Use non-skid mats or decals in the tub or shower.  If you need to sit down in the shower, use a plastic, non-slip stool.  Keep the  floor dry. Clean up any water that spills on the floor as soon as it happens.  Remove soap buildup in the tub or shower regularly.  Attach bath mats securely with double-sided non-slip rug tape.  Do not have throw rugs and other things on the floor that can make you trip. What can I do in the bedroom?  Use night lights.  Make sure that you have a light by your bed that is easy to reach.  Do not use any sheets or blankets that are too big for your bed. They should not hang down onto the floor.  Have a firm chair that has side arms. You can use this for support while you get dressed.  Do not have throw rugs and other things on the floor that can make you trip. What can I do in the kitchen?  Clean up any spills right away.  Avoid walking on wet floors.  Keep items that you use a lot in easy-to-reach places.  If you need to reach something above you, use a strong step stool that has a grab bar.  Keep electrical cords out of the way.  Do not use floor polish or wax that makes floors  slippery. If you must use wax, use non-skid floor wax.  Do not have throw rugs and other things on the floor that can make you trip. What can I do with my stairs?  Do not leave any items on the stairs.  Make sure that there are handrails on both sides of the stairs and use them. Fix handrails that are broken or loose. Make sure that handrails are as long as the stairways.  Check any carpeting to make sure that it is firmly attached to the stairs. Fix any carpet that is loose or worn.  Avoid having throw rugs at the top or bottom of the stairs. If you do have throw rugs, attach them to the floor with carpet tape.  Make sure that you have a light switch at the top of the stairs and the bottom of the stairs. If you do not have them, ask someone to add them for you. What else can I do to help prevent falls?  Wear shoes that:  Do not have high heels.  Have rubber bottoms.  Are comfortable and fit  you well.  Are closed at the toe. Do not wear sandals.  If you use a stepladder:  Make sure that it is fully opened. Do not climb a closed stepladder.  Make sure that both sides of the stepladder are locked into place.  Ask someone to hold it for you, if possible.  Clearly mark and make sure that you can see:  Any grab bars or handrails.  First and last steps.  Where the edge of each step is.  Use tools that help you move around (mobility aids) if they are needed. These include:  Canes.  Walkers.  Scooters.  Crutches.  Turn on the lights when you go into a dark area. Replace any light bulbs as soon as they burn out.  Set up your furniture so you have a clear path. Avoid moving your furniture around.  If any of your floors are uneven, fix them.  If there are any pets around you, be aware of where they are.  Review your medicines with your doctor. Some medicines can make you feel dizzy. This can increase your chance of falling. Ask your doctor what other things that you can do to help prevent falls. This information is not intended to replace advice given to you by your health care provider. Make sure you discuss any questions you have with your health care provider. Document Released: 01/08/2009 Document Revised: 08/20/2015 Document Reviewed: 04/18/2014 Elsevier Interactive Patient Education  2017 [employer].

## 2019-06-10 NOTE — Progress Notes (Signed)
Subjective:   Jayli Gutterman is a 79 y.o. female who presents for Medicare Annual (Subsequent) preventive examination.  Review of Systems: N/A   This visit is being conducted through telemedicine via telephone at the nurse health advisor's home address due to the COVID-19 pandemic. This patient has given me verbal consent via doximity to conduct this visit, patient states they are participating from their home address. Patient and myself are on the telephone call. There is no referral for this visit. Some vital signs may be absent or patient reported.    Patient identification: identified by name, DOB, and current address   Cardiac Risk Factors include: advanced age (>65men, >9 women);dyslipidemia     Objective:     Vitals: There were no vitals taken for this visit.  There is no height or weight on file to calculate BMI.  Advanced Directives 06/10/2019 06/06/2018 05/31/2017 04/14/2015 08/27/2014 08/02/2014 08/02/2014  Does Patient Have a Medical Advance Directive? No No No No No No No  Does patient want to make changes to medical advance directive? - - Yes (MAU/Ambulatory/Procedural Areas - Information given) - - - -  Would patient like information on creating a medical advance directive? Yes (MAU/Ambulatory/Procedural Areas - Information given) Yes (MAU/Ambulatory/Procedural Areas - Information given) - - No - patient declined information - -    Tobacco Social History   Tobacco Use  Smoking Status Never Smoker  Smokeless Tobacco Never Used     Counseling given: Not Answered   Clinical Intake:  Pre-visit preparation completed: Yes  Pain : No/denies pain     Nutritional Risks: None Diabetes: No  How often do you need to have someone help you when you read instructions, pamphlets, or other written materials from your doctor or pharmacy?: 1 - Never What is the last grade level you completed in school?: Masters in Field seismologist Needed?: No  Information entered by ::  CJohnson, LPN  Past Medical History:  Diagnosis Date  . Arthritis   . Asthma   . Cancer of breast ([place])    33 years ago, left,   . Hearing loss    both ears, wwears hearing aides  . Hyperlipidemia   . Macular degeneration   . Osteopenia   . Pneumonia    Past Surgical History:  Procedure Laterality Date  . ABDOMINAL HYSTERECTOMY    . BREAST ENHANCEMENT SURGERY    . CESAREAN SECTION     x 2  . COLONOSCOPY WITH PROPOFOL N/A 04/21/2014   Procedure: COLONOSCOPY WITH PROPOFOL;  Surgeon: Garlan Fair, MD;  Location: [facility];  Service: Endoscopy;  Laterality: N/A;  . EYE SURGERY Bilateral march 2015 and june 2015   lens for cataracts  . LEFT HEART CATHETERIZATION WITH CORONARY ANGIOGRAM N/A 01/24/2012   Procedure: LEFT HEART CATHETERIZATION WITH CORONARY ANGIOGRAM;  Surgeon: Sueanne Margarita, MD;  Location: [facility];  Service: Cardiovascular;  Laterality: N/A;  . MASTECTOMY  33 yrs ago   bilateral   Family History  Problem Relation Age of Onset  . Macular degeneration Mother   . Heart attack Father    Social History   Socioeconomic History  . Marital status: Married    Spouse name: Not on file  . Number of children: Not on file  . Years of education: Not on file  . Highest education level: Not on file  Occupational History  . Occupation: retired  Tobacco Use  . Smoking status: Never Smoker  . Smokeless tobacco: Never  Used  Substance and Sexual Activity  . Alcohol use: Yes    Comment: occasionally  . Drug use: No  . Sexual activity: Not Currently  Other Topics Concern  . Not on file  Social History Narrative   Married.   2 children, 6 grandchildren.   Retired. Previously was Pharmacist, hospital.    Enjoys participating in church.    Social Determinants of Health   Financial Resource Strain: Low Risk   . Difficulty of Paying Living Expenses: Not hard at all  Food Insecurity: No Food Insecurity  . Worried About Charity fundraiser in the Last Year: Never true  .  Ran Out of Food in the Last Year: Never true  Transportation Needs: No Transportation Needs  . Lack of Transportation (Medical): No  . Lack of Transportation (Non-Medical): No  Physical Activity: Inactive  . Days of Exercise per Week: 0 days  . Minutes of Exercise per Session: 0 min  Stress: No Stress Concern Present  . Feeling of Stress : Not at all  Social Connections:   . Frequency of Communication with Friends and Family:   . Frequency of Social Gatherings with Friends and Family:   . Attends Religious Services:   . Active Member of Clubs or Organizations:   . Attends Archivist Meetings:   Marland Kitchen Marital Status:     Outpatient Encounter Medications as of 06/10/2019  Medication Sig  . albuterol (PROVENTIL HFA;VENTOLIN HFA) 108 (90 Base) MCG/ACT inhaler Inhale 2 puffs into the lungs every 6 (six) hours as needed for wheezing or shortness of breath.  . benzonatate (TESSALON) 200 MG capsule Take 1 capsule (200 mg total) by mouth 3 (three) times daily as needed for cough.  . calcium carbonate (TUMS) 500 MG chewable tablet Chew 1,000 mg by mouth at bedtime.   . Cholecalciferol (VITAMIN D) 1000 UNITS capsule Take 1,000 Units by mouth daily with breakfast.   . diclofenac (VOLTAREN) 75 MG EC tablet TAKE 1 TABLET BY MOUTH UP TO TWICE DAILY AS NEEDED FOR PAIN.  . DUPIXENT 300 MG/2ML prefilled syringe INJECT 300MG  (1 SYRINGE) SUBCUTANEOUSLY EVERY OTHER WEEK.  . fluticasone (FLONASE) 50 MCG/ACT nasal spray Place 1 spray into both nostrils 2 (two) times daily.  Marland Kitchen levocetirizine (XYZAL) 5 MG tablet TAKE 1 TABLET BY MOUTH EVERY DAY IN THE EVENING FOR ALLERGIES.  Marland Kitchen montelukast (SINGULAIR) 10 MG tablet TAKE 1 TABLET BY MOUTH EVERYDAY AT BEDTIME  . Multiple Vitamins-Minerals (MULTI FOR HER 50+) TABS Take 1 tablet by mouth daily.  . Multiple Vitamins-Minerals (PRESERVISION/LUTEIN) CAPS Take 1 capsule by mouth 2 (two) times daily.  . raloxifene (EVISTA) 60 MG tablet TAKE 1 TABLET (60 MG TOTAL) BY  MOUTH DAILY WITH BREAKFAST.  . SYMBICORT 160-4.5 MCG/ACT inhaler INHALE 2 PUFFS INTO THE LUNGS TWO TIMES DAILY   No facility-administered encounter medications on file as of 06/10/2019.    Activities of Daily Living In your present state of health, do you have any difficulty performing the following activities: 06/10/2019  Hearing? Y  Comment wears hearing aids  Vision? Y  Comment Patient has macular degeneration  Difficulty concentrating or making decisions? N  Walking or climbing stairs? N  Dressing or bathing? N  Doing errands, shopping? N  Preparing Food and eating ? N  Using the Toilet? N  In the past six months, have you accidently leaked urine? Y  Comment wears liner day and night  Do you have problems with loss of bowel control? N  Managing your  Medications? N  Managing your Finances? N  Housekeeping or managing your Housekeeping? N  Some recent data might be hidden    Patient Care Team: Pleas Koch, NP as PCP - General (Internal Medicine)    Assessment:   This is a routine wellness examination for [place].  Exercise Activities and Dietary recommendations Current Exercise Habits: The patient does not participate in regular exercise at present, Exercise limited by: None identified  Goals    . Increase physical activity     Starting 06/06/2018, I will continue to do physical therapy exercises for 10 minutes daily.     . Patient Stated     06/10/2019, I will maintain and continue medications as prescribed.        Fall Risk Fall Risk  06/10/2019 06/06/2018 05/31/2017  Falls in the past year? 0 0 No  Number falls in past yr: 0 - -  Injury with Fall? 0 - -  Risk for fall due to : No Fall Risks - -  Follow up Falls evaluation completed;Falls prevention discussed - -   Is the patient's home free of loose throw rugs in walkways, pet beds, electrical cords, etc?   yes      Grab bars in the bathroom? no      Handrails on the stairs?   yes      Adequate lighting?    yes  Timed Get Up and Go performed: N/A  Depression Screen PHQ 2/9 Scores 06/10/2019 06/06/2018 05/31/2017 10/10/2016  PHQ - 2 Score 0 0 0 0  PHQ- 9 Score 0 0 0 -     Cognitive Function MMSE - Mini Mental State Exam 06/10/2019 06/06/2018 05/31/2017  Orientation to time 5 5 5   Orientation to Place 5 5 5   Registration 3 3 3   Attention/ Calculation 5 0 0  Recall 3 3 3   Language- name 2 objects - 0 0  Language- repeat 1 1 1   Language- follow 3 step command - 3 3  Language- read & follow direction - 0 0  Write a sentence - 0 0  Copy design - 0 0  Total score - 20 20  Mini Cog  Mini-Cog screen was completed. Maximum score is 22. A value of 0 denotes this part of the MMSE was not completed or the patient failed this part of the Mini-Cog screening.       Immunization History  Administered Date(s) Administered  . Fluad Quad(high Dose 65+) 01/15/2019  . Influenza Split 12/07/2011  . Influenza Whole 03/11/2010  . Influenza, High Dose Seasonal PF 11/25/2013  . Influenza,inj,Quad PF,6+ Mos 12/26/2013, 12/02/2014, 01/05/2017, 01/16/2018  . Influenza-Unspecified 12/07/2011  . PFIZER SARS-COV-2 Vaccination 04/20/2019, 05/12/2019  . Pneumococcal Conjugate-13 11/25/2013, 12/26/2013  . Pneumococcal Polysaccharide-23 03/28/2004, 05/10/2006, 01/16/2018  . Td 08/08/1999  . Tdap 03/28/2009, 08/20/2009  . Zoster 03/28/2009, 08/20/2009    Qualifies for Shingles Vaccine: Yes   Screening Tests Health Maintenance  Topic Date Due  . TETANUS/TDAP  08/21/2019  . INFLUENZA VACCINE  Completed  . DEXA SCAN  Completed  . PNA vac Low Risk Adult  Completed    Cancer Screenings: Lung: Low Dose CT Chest recommended if Age 15-80 years, 30 pack-year currently smoking OR have quit w/in 15 years. Patient does not qualify. Breast:  Up to date on Mammogram: Yes, completed 08/28/2018   Up to date of Bone Density/Dexa: Yes, completed 08/28/2018 Colorectal: completed 04/21/2014  Additional Screenings:  Hepatitis  C Screening: N/A     Plan:  Patient will maintain and continue medications as prescribed.    I have personally reviewed and noted the following in the patient's chart:   . Medical and social history . Use of alcohol, tobacco or illicit drugs  . Current medications and supplements . Functional ability and status . Nutritional status . Physical activity . Advanced directives . List of other physicians . Hospitalizations, surgeries, and ER visits in previous 12 months . Vitals . Screenings to include cognitive, depression, and falls . Referrals and appointments  In addition, I have reviewed and discussed with patient certain preventive protocols, quality metrics, and best practice recommendations. A written personalized care plan for preventive services as well as general preventive health recommendations were provided to patient.     Dashanay, Pitkin, LPN  [phone]

## 2019-06-10 NOTE — Progress Notes (Signed)
PCP notes:  Health Maintenance: No gaps noted   Abnormal Screenings: none   Patient concerns: none   Nurse concerns: none   Next PCP appt.: 06/14/2019 @ 10:20 am

## 2019-06-10 NOTE — Progress Notes (Signed)
Subjective:    Patient ID: [name], female    DOB: 04/11/1940, 79 y.o.   MRN: [id number]  HPI  Pt presents to the clinic today with c/o scaly skin behind her left ear. She noticed this 2 days ago. The area does not itch or burn. She denies any pain or drainage from the area. She has not tried anything OTC for this.  She also reports bump on the left side of her cheek. She noticed this yesterday. The bump has not drained. It is not tender to touch. She has not tried anything OTC for this.  Review of Systems      Past Medical History:  Diagnosis Date  . Arthritis   . Asthma   . Cancer of breast ([place])    33 years ago, left,   . Hearing loss    both ears, wwears hearing aides  . Hyperlipidemia   . Macular degeneration   . Osteopenia   . Pneumonia     Current Outpatient Medications  Medication Sig Dispense Refill  . albuterol (PROVENTIL HFA;VENTOLIN HFA) 108 (90 Base) MCG/ACT inhaler Inhale 2 puffs into the lungs every 6 (six) hours as needed for wheezing or shortness of breath.    . benzonatate (TESSALON) 200 MG capsule Take 1 capsule (200 mg total) by mouth 3 (three) times daily as needed for cough. 30 capsule 2  . calcium carbonate (TUMS) 500 MG chewable tablet Chew 1,000 mg by mouth at bedtime.     . Cholecalciferol (VITAMIN D) 1000 UNITS capsule Take 1,000 Units by mouth daily with breakfast.     . diclofenac (VOLTAREN) 75 MG EC tablet TAKE 1 TABLET BY MOUTH UP TO TWICE DAILY AS NEEDED FOR PAIN. 180 tablet 0  . DUPIXENT 300 MG/2ML prefilled syringe INJECT 300MG  (1 SYRINGE) SUBCUTANEOUSLY EVERY OTHER WEEK. 4 mL 5  . fluticasone (FLONASE) 50 MCG/ACT nasal spray Place 1 spray into both nostrils 2 (two) times daily.    Marland Kitchen levocetirizine (XYZAL) 5 MG tablet TAKE 1 TABLET BY MOUTH EVERY DAY IN THE EVENING FOR ALLERGIES. 90 tablet 3  . montelukast (SINGULAIR) 10 MG tablet TAKE 1 TABLET BY MOUTH EVERYDAY AT BEDTIME 90 tablet 0  . Multiple Vitamins-Minerals (MULTI FOR HER 50+)  TABS Take 1 tablet by mouth daily.    . Multiple Vitamins-Minerals (PRESERVISION/LUTEIN) CAPS Take 1 capsule by mouth 2 (two) times daily.    . raloxifene (EVISTA) 60 MG tablet TAKE 1 TABLET (60 MG TOTAL) BY MOUTH DAILY WITH BREAKFAST. 90 tablet 1  . SYMBICORT 160-4.5 MCG/ACT inhaler INHALE 2 PUFFS INTO THE LUNGS TWO TIMES DAILY 10.2 Inhaler 5   No current facility-administered medications for this visit.    Allergies  Allergen Reactions  . Fosamax [Alendronate Sodium] Nausea Only  . Sulfonamide Derivatives Rash    Family History  Problem Relation Age of Onset  . Macular degeneration Mother   . Heart attack Father     Social History   Socioeconomic History  . Marital status: Married    Spouse name: Not on file  . Number of children: Not on file  . Years of education: Not on file  . Highest education level: Not on file  Occupational History  . Occupation: retired  Tobacco Use  . Smoking status: Never Smoker  . Smokeless tobacco: Never Used  Substance and Sexual Activity  . Alcohol use: Yes    Comment: glass wine 1x month  . Drug use: No  . Sexual activity: Not  Currently  Other Topics Concern  . Not on file  Social History Narrative   Married.   2 children, 6 grandchildren.   Retired. Previously was Pharmacist, hospital.    Enjoys participating in church.    Social Determinants of Health   Financial Resource Strain:   . Difficulty of Paying Living Expenses:   Food Insecurity:   . Worried About Charity fundraiser in the Last Year:   . Arboriculturist in the Last Year:   Transportation Needs:   . Film/video editor (Medical):   Marland Kitchen Lack of Transportation (Non-Medical):   Physical Activity:   . Days of Exercise per Week:   . Minutes of Exercise per Session:   Stress:   . Feeling of Stress :   Social Connections:   . Frequency of Communication with Friends and Family:   . Frequency of Social Gatherings with Friends and Family:   . Attends Religious Services:   .  Active Member of Clubs or Organizations:   . Attends Archivist Meetings:   Marland Kitchen Marital Status:   Intimate Partner Violence:   . Fear of Current or Ex-Partner:   . Emotionally Abused:   Marland Kitchen Physically Abused:   . Sexually Abused:      Constitutional: Denies fever, malaise, fatigue, headache or abrupt weight changes.  HEENT: Denies eye pain, eye redness, ear pain, ringing in the ears, wax buildup, runny nose, nasal congestion, bloody nose, or sore throat. Respiratory: Denies difficulty breathing, shortness of breath, cough or sputum production.   Cardiovascular: Denies chest pain, chest tightness, palpitations or swelling in the hands or feet.  Skin: Pt reports scaly skin behind left ear, skin lesion on left cheek. Denies ulcercations.    No other specific complaints in a complete review of systems (except as listed in HPI above).  Objective:   Physical Exam  BP 122/84   Pulse 95   Temp 98.2 F (36.8 C) (Temporal)   Wt 134 lb (60.8 kg)   SpO2 98%   BMI 27.06 kg/m  Wt Readings from Last 3 Encounters:  06/10/19 134 lb (60.8 kg)  08/03/18 136 lb 6.4 oz (61.9 kg)  06/21/18 137 lb (62.1 kg)    General: Appears her stated age, well developed, well nourished in NAD. Skin: Patch of eczema noted behind left ear. Pimple note on left cheek. Cardiovascular: Normal rate and rhythm.  Pulmonary/Chest: Normal effort and positive vesicular breath sounds. No respiratory distress.  Neurological: Alert and oriented.   BMET    Component Value Date/Time   NA 140 06/06/2018 0911   K 4.2 06/06/2018 0911   CL 105 06/06/2018 0911   CO2 29 06/06/2018 0911   GLUCOSE 92 06/06/2018 0911   BUN 20 06/06/2018 0911   CREATININE 0.67 06/06/2018 0911   CALCIUM 9.5 06/06/2018 0911   GFRNONAA >60 08/31/2014 0528   GFRAA >60 08/31/2014 0528    Lipid Panel     Component Value Date/Time   CHOL 215 (H) 06/06/2018 0911   TRIG 192.0 (H) 06/06/2018 0911   HDL 49.90 06/06/2018 0911   CHOLHDL 4  06/06/2018 0911   VLDL 38.4 06/06/2018 0911   LDLCALC 127 (H) 06/06/2018 0911    CBC    Component Value Date/Time   WBC 9.8 06/06/2018 0911   RBC 4.32 06/06/2018 0911   HGB 13.2 06/06/2018 0911   HCT 39.6 06/06/2018 0911   PLT 336.0 06/06/2018 0911   MCV 91.6 06/06/2018 0911   MCH 27.4 08/31/2014  0528   MCHC 33.3 06/06/2018 0911   RDW 13.6 06/06/2018 0911   LYMPHSABS 2.3 06/06/2018 0911   MONOABS 0.9 06/06/2018 0911   EOSABS 0.2 06/06/2018 0911   BASOSABS 0.1 06/06/2018 0911    Hgb A1C No results found for: HGBA1C          Assessment & Plan:   Eczema, Left Ear:  Hydrocortisone 1% cream OTC BID prn  Pimple:  Warm compresses TID Will monitor  Return precautions discussed Webb Silversmith, NP This visit occurred during the SARS-CoV-2 public health emergency.  Safety protocols were in place, including screening questions prior to the visit, additional usage of staff PPE, and extensive cleaning of exam room while observing appropriate contact time as indicated for disinfecting solutions.

## 2019-06-10 NOTE — Patient Instructions (Signed)

## 2019-06-14 ENCOUNTER — Ambulatory Visit (INDEPENDENT_AMBULATORY_CARE_PROVIDER_SITE_OTHER): Payer: Medicare HMO | Admitting: Primary Care

## 2019-06-14 ENCOUNTER — Other Ambulatory Visit: Payer: Self-pay

## 2019-06-14 ENCOUNTER — Encounter: Payer: Self-pay | Admitting: Primary Care

## 2019-06-14 VITALS — BP 130/80 | HR 94 | Temp 96.7°F | Ht 59.0 in | Wt 137.0 lb

## 2019-06-14 DIAGNOSIS — M79604 Pain in right leg: Secondary | ICD-10-CM

## 2019-06-14 DIAGNOSIS — R053 Chronic cough: Secondary | ICD-10-CM

## 2019-06-14 DIAGNOSIS — R05 Cough: Secondary | ICD-10-CM

## 2019-06-14 DIAGNOSIS — J4540 Moderate persistent asthma, uncomplicated: Secondary | ICD-10-CM | POA: Diagnosis not present

## 2019-06-14 DIAGNOSIS — Z0000 Encounter for general adult medical examination without abnormal findings: Secondary | ICD-10-CM

## 2019-06-14 DIAGNOSIS — R21 Rash and other nonspecific skin eruption: Secondary | ICD-10-CM | POA: Insufficient documentation

## 2019-06-14 DIAGNOSIS — D649 Anemia, unspecified: Secondary | ICD-10-CM

## 2019-06-14 DIAGNOSIS — M8580 Other specified disorders of bone density and structure, unspecified site: Secondary | ICD-10-CM | POA: Diagnosis not present

## 2019-06-14 DIAGNOSIS — J8283 Eosinophilic asthma: Secondary | ICD-10-CM

## 2019-06-14 DIAGNOSIS — E782 Mixed hyperlipidemia: Secondary | ICD-10-CM | POA: Diagnosis not present

## 2019-06-14 DIAGNOSIS — J309 Allergic rhinitis, unspecified: Secondary | ICD-10-CM | POA: Diagnosis not present

## 2019-06-14 MED ORDER — VALACYCLOVIR HCL 1 G PO TABS: 1000.0000 mg | ORAL_TABLET | Freq: Three times a day (TID) | ORAL | 0 refills | Status: DC

## 2019-06-14 NOTE — Assessment & Plan Note (Signed)
Doing well on Singulair and Xyzal, continue same.

## 2019-06-14 NOTE — Assessment & Plan Note (Signed)
LDL of 131 with ASCVD risk score of 21%. She kindly declines treatment despite recommendations and explanation of ASCVD risk.

## 2019-06-14 NOTE — Assessment & Plan Note (Signed)
Doing well on Symbicort 2 puffs BID, Dupixent every other week, Singulair nightly.   Following with pulmonology.

## 2019-06-14 NOTE — Patient Instructions (Signed)
Start valacyclovir 1000 mg for likely shingles. Take 1 tablet by mouth three times daily for 7 days.  Start exercising. You should be getting 150 minutes of exercise weekly.  Continue to work on a healthy diet. Ensure you are consuming 64 ounces of water daily.  Please update me Monday as discussed.  It was a pleasure to see you today!   Preventive Care 79 Years and Older, Female Preventive care refers to lifestyle choices and visits with your health care provider that can promote health and wellness. This includes:  A yearly physical exam. This is also called an annual well check.  Regular dental and eye exams.  Immunizations.  Screening for certain conditions.  Healthy lifestyle choices, such as diet and exercise. What can I expect for my preventive care visit? Physical exam Your health care provider will check:  Height and weight. These may be used to calculate body mass index (BMI), which is a measurement that tells if you are at a healthy weight.  Heart rate and blood pressure.  Your skin for abnormal spots. Counseling Your health care provider may ask you questions about:  Alcohol, tobacco, and drug use.  Emotional well-being.  Home and relationship well-being.  Sexual activity.  Eating habits.  History of falls.  Memory and ability to understand (cognition).  Work and work environment.  Pregnancy and menstrual history. What immunizations do I need?  Influenza (flu) vaccine  This is recommended every year. Tetanus, diphtheria, and pertussis (Tdap) vaccine  You may need a Td booster every 10 years. Varicella (chickenpox) vaccine  You may need this vaccine if you have not already been vaccinated. Zoster (shingles) vaccine  You may need this after age 60. Pneumococcal conjugate (PCV13) vaccine  One dose is recommended after age 79. Pneumococcal polysaccharide (PPSV23) vaccine  One dose is recommended after age 79. Measles, mumps, and rubella  (MMR) vaccine  You may need at least one dose of MMR if you were born in 1957 or later. You may also need a second dose. Meningococcal conjugate (MenACWY) vaccine  You may need this if you have certain conditions. Hepatitis A vaccine  You may need this if you have certain conditions or if you travel or work in places where you may be exposed to hepatitis A. Hepatitis B vaccine  You may need this if you have certain conditions or if you travel or work in places where you may be exposed to hepatitis B. Haemophilus influenzae type b (Hib) vaccine  You may need this if you have certain conditions. You may receive vaccines as individual doses or as more than one vaccine together in one shot (combination vaccines). Talk with your health care provider about the risks and benefits of combination vaccines. What tests do I need? Blood tests  Lipid and cholesterol levels. These may be checked every 5 years, or more frequently depending on your overall health.  Hepatitis C test.  Hepatitis B test. Screening  Lung cancer screening. You may have this screening every year starting at age 55 if you have a 30-pack-year history of smoking and currently smoke or have quit within the past 15 years.  Colorectal cancer screening. All adults should have this screening starting at age 79 and continuing until age 75. Your health care provider may recommend screening at age 45 if you are at increased risk. You will have tests every 1-10 years, depending on your results and the type of screening test.  Diabetes screening. This is done by checking   your blood sugar (glucose) after you have not eaten for a while (fasting). You may have this done every 1-3 years.  Mammogram. This may be done every 1-2 years. Talk with your health care provider about how often you should have regular mammograms.  BRCA-related cancer screening. This may be done if you have a family history of breast, ovarian, tubal, or peritoneal  cancers. Other tests  Sexually transmitted disease (STD) testing.  Bone density scan. This is done to screen for osteoporosis. You may have this done starting at age 65. Follow these instructions at home: Eating and drinking  Eat a diet that includes fresh fruits and vegetables, whole grains, lean protein, and low-fat dairy products. Limit your intake of foods with high amounts of sugar, saturated fats, and salt.  Take vitamin and mineral supplements as recommended by your health care provider.  Do not drink alcohol if your health care provider tells you not to drink.  If you drink alcohol: ? Limit how much you have to 0-1 drink a day. ? Be aware of how much alcohol is in your drink. In the [place] one drink equals one 12 oz bottle of beer (355 mL), one 5 oz glass of wine (148 mL), or one 1 oz glass of hard liquor (44 mL). Lifestyle  Take daily care of your teeth and gums.  Stay active. Exercise for at least 30 minutes on 5 or more days each week.  Do not use any products that contain nicotine or tobacco, such as cigarettes, e-cigarettes, and chewing tobacco. If you need help quitting, ask your health care provider.  If you are sexually active, practice safe sex. Use a condom or other form of protection in order to prevent STIs (sexually transmitted infections).  Talk with your health care provider about taking a low-dose aspirin or statin. What's next?  Go to your health care provider once a year for a well check visit.  Ask your health care provider how often you should have your eyes and teeth checked.  Stay up to date on all vaccines. This information is not intended to replace advice given to you by your health care provider. Make sure you discuss any questions you have with your health care provider. Document Revised: 03/08/2018 Document Reviewed: 03/08/2018 Elsevier Patient Education  2020 Elsevier Inc.  

## 2019-06-14 NOTE — Assessment & Plan Note (Signed)
Compliant to calcium and vitamin D. Bone density scan from 2020 reviewed.

## 2019-06-14 NOTE — Progress Notes (Signed)
Subjective:    Patient ID: [name], female    DOB: 12-Jan-1941, 79 y.o.   MRN: [id number]  HPI  This visit occurred during the SARS-CoV-2 public health emergency.  Safety protocols were in place, including screening questions prior to the visit, additional usage of staff PPE, and extensive cleaning of exam room while observing appropriate contact time as indicated for disinfecting solutions.   [name] is a 79 year old female who presents today for complete physical.  Evaluated four days ago by Webb Silversmith, NP for scaly skin behind the left ear and a "bump" to the left cheek. Treated with hydrocortisone cream. Since the she's noticed a new bump to the left side of the frontal lobe. The bump behind her left ear start crusting so the picked it off. No improvement to the bump on her cheek.   She denies burning, pain, visual changes. She does have itching.  Immunizations: -Tetanus: Completed in 2011 -Influenza: Completed this season  -Shingles: Completed Zostavax in 2011 -Pneumonia: Completed series  Diet: She endorses a fair diet.  Exercise: She is not exercising  Eye exam: Follows every 6 weeks Dental exam: Completes every 6 months  Mammogram: Completed in June 2020 Dexa: Completed in 2020, osteopenia, taking calcium and vitamin D Colonoscopy: Completed in 2016  BP Readings from Last 3 Encounters:  06/14/19 130/80  06/10/19 122/84  08/03/18 140/64    The 10-year ASCVD risk score Mikey Bussing DC Jr., et al., 2013) is: 21.8%   Values used to calculate the score:     Age: 89 years     Sex: Female     Is Non-Hispanic African American: No     Diabetic: No     Tobacco smoker: No     Systolic Blood Pressure: [id number] mmHg     Is BP treated: No     HDL Cholesterol: 44.9 mg/dL     Total Cholesterol: 214 mg/dL   Review of Systems  Constitutional: Negative for unexpected weight change.  HENT: Negative for rhinorrhea.   Respiratory: Negative for cough and shortness of breath.    Cardiovascular: Negative for chest pain.  Gastrointestinal: Negative for constipation and diarrhea.  Genitourinary: Negative for difficulty urinating.  Musculoskeletal: Positive for arthralgias.  Skin: Positive for rash.       See HPI  Allergic/Immunologic: Positive for environmental allergies.  Neurological: Negative for dizziness and headaches.  Psychiatric/Behavioral: The patient is not nervous/anxious.        Past Medical History:  Diagnosis Date  . Arthritis   . Asthma   . Cancer of breast ([place])    33 years ago, left,   . Hearing loss    both ears, wwears hearing aides  . Hyperlipidemia   . Macular degeneration   . Osteopenia   . Pneumonia      Social History   Socioeconomic History  . Marital status: Married    Spouse name: Not on file  . Number of children: Not on file  . Years of education: Not on file  . Highest education level: Not on file  Occupational History  . Occupation: retired  Tobacco Use  . Smoking status: Never Smoker  . Smokeless tobacco: Never Used  Substance and Sexual Activity  . Alcohol use: Yes    Comment: occasionally  . Drug use: No  . Sexual activity: Not Currently  Other Topics Concern  . Not on file  Social History Narrative   Married.   2 children, 6 grandchildren.  Retired. Previously was Pharmacist, hospital.    Enjoys participating in church.    Social Determinants of Health   Financial Resource Strain: Low Risk   . Difficulty of Paying Living Expenses: Not hard at all  Food Insecurity: No Food Insecurity  . Worried About Charity fundraiser in the Last Year: Never true  . Ran Out of Food in the Last Year: Never true  Transportation Needs: No Transportation Needs  . Lack of Transportation (Medical): No  . Lack of Transportation (Non-Medical): No  Physical Activity: Inactive  . Days of Exercise per Week: 0 days  . Minutes of Exercise per Session: 0 min  Stress: No Stress Concern Present  . Feeling of Stress : Not at all   Social Connections:   . Frequency of Communication with Friends and Family:   . Frequency of Social Gatherings with Friends and Family:   . Attends Religious Services:   . Active Member of Clubs or Organizations:   . Attends Archivist Meetings:   Marland Kitchen Marital Status:   Intimate Partner Violence: Not At Risk  . Fear of Current or Ex-Partner: No  . Emotionally Abused: No  . Physically Abused: No  . Sexually Abused: No    Past Surgical History:  Procedure Laterality Date  . ABDOMINAL HYSTERECTOMY    . BREAST ENHANCEMENT SURGERY    . CESAREAN SECTION     x 2  . COLONOSCOPY WITH PROPOFOL N/A 04/21/2014   Procedure: COLONOSCOPY WITH PROPOFOL;  Surgeon: Garlan Fair, MD;  Location: [facility];  Service: Endoscopy;  Laterality: N/A;  . EYE SURGERY Bilateral march 2015 and june 2015   lens for cataracts  . LEFT HEART CATHETERIZATION WITH CORONARY ANGIOGRAM N/A 01/24/2012   Procedure: LEFT HEART CATHETERIZATION WITH CORONARY ANGIOGRAM;  Surgeon: Sueanne Margarita, MD;  Location: [facility];  Service: Cardiovascular;  Laterality: N/A;  . MASTECTOMY  33 yrs ago   bilateral    Family History  Problem Relation Age of Onset  . Macular degeneration Mother   . Heart attack Father     Allergies  Allergen Reactions  . Fosamax [Alendronate Sodium] Nausea Only  . Sulfonamide Derivatives Rash    Current Outpatient Medications on File Prior to Visit  Medication Sig Dispense Refill  . Cholecalciferol (VITAMIN D) 1000 UNITS capsule Take 1,000 Units by mouth daily with breakfast.     . diclofenac (VOLTAREN) 75 MG EC tablet TAKE 1 TABLET BY MOUTH UP TO TWICE DAILY AS NEEDED FOR PAIN. 180 tablet 0  . DUPIXENT 300 MG/2ML prefilled syringe INJECT 300MG  (1 SYRINGE) SUBCUTANEOUSLY EVERY OTHER WEEK. 4 mL 5  . fluticasone (FLONASE) 50 MCG/ACT nasal spray Place 1 spray into both nostrils 2 (two) times daily.    Marland Kitchen levocetirizine (XYZAL) 5 MG tablet TAKE 1 TABLET BY MOUTH EVERY DAY IN THE  EVENING FOR ALLERGIES. 90 tablet 3  . montelukast (SINGULAIR) 10 MG tablet TAKE 1 TABLET BY MOUTH EVERYDAY AT BEDTIME 90 tablet 0  . Multiple Vitamins-Minerals (MULTI FOR HER 50+) TABS Take 1 tablet by mouth daily.    . Multiple Vitamins-Minerals (PRESERVISION/LUTEIN) CAPS Take 1 capsule by mouth 2 (two) times daily.    . raloxifene (EVISTA) 60 MG tablet TAKE 1 TABLET (60 MG TOTAL) BY MOUTH DAILY WITH BREAKFAST. 90 tablet 1  . SYMBICORT 160-4.5 MCG/ACT inhaler INHALE 2 PUFFS INTO THE LUNGS TWO TIMES DAILY 10.2 Inhaler 5   No current facility-administered medications on file prior to visit.  BP 130/80   Pulse 94   Temp (!) 96.7 F (35.9 C) (Temporal)   Ht 4\' 11"  (1.499 m)   Wt 137 lb (62.1 kg)   SpO2 98%   BMI 27.67 kg/m    Objective:   Physical Exam  Constitutional: She is oriented to person, place, and time. She appears well-nourished.  HENT:  Right Ear: Tympanic membrane and ear canal normal.  Left Ear: Tympanic membrane and ear canal normal.  Mouth/Throat: Oropharynx is clear and moist.  Eyes: Pupils are equal, round, and reactive to light. EOM are normal.  Cardiovascular: Normal rate and regular rhythm.  Respiratory: Effort normal and breath sounds normal.  GI: Soft. Bowel sounds are normal. There is no abdominal tenderness.  Musculoskeletal:        General: Normal range of motion.     Cervical back: Neck supple.  Neurological: She is alert and oriented to person, place, and time. No cranial nerve deficit.  Reflex Scores:      Patellar reflexes are 2+ on the right side and 2+ on the left side. Skin: Skin is warm and dry. Rash noted.  Three red, round, slightly raised bumps to left side of face. Mild scaling to bump of left cheek. No rash to right side.  Psychiatric: She has a normal mood and affect.           Assessment & Plan:

## 2019-06-14 NOTE — Assessment & Plan Note (Signed)
Using diclofenac most days of the week, twice daily, doing well on this regimen. Continue current regimen.

## 2019-06-14 NOTE — Assessment & Plan Note (Signed)
Improved with Symbicort, Dupixent, Singulair, Xyzal. Following with pulmonology.

## 2019-06-14 NOTE — Assessment & Plan Note (Signed)
Immunizations UTD, she will check on insurance coverage for tetanus. Mammogram UTD. Colonoscopy UTD.  Encouraged a healthy diet and regular weight bearing exercise.  Exam today as noted in HPI. Labs reviewed.

## 2019-06-14 NOTE — Assessment & Plan Note (Signed)
Following with pulmonology, doing well on Symbicort, Dupixent, Singulair, continue same.

## 2019-06-14 NOTE — Assessment & Plan Note (Signed)
Representative of herpes zoster. Given new spot with crusting of initial spot, will treat. Rx for Valtrex course sent to pharmacy.   She will update her ophthalmologist.  She will update [place] next week.

## 2019-06-14 NOTE — Assessment & Plan Note (Signed)
Recent CBC unremarkable. Continue to monitor.  

## 2019-06-16 ENCOUNTER — Other Ambulatory Visit: Payer: Self-pay | Admitting: Primary Care

## 2019-06-16 DIAGNOSIS — J309 Allergic rhinitis, unspecified: Secondary | ICD-10-CM

## 2019-06-16 DIAGNOSIS — J4540 Moderate persistent asthma, uncomplicated: Secondary | ICD-10-CM

## 2019-06-17 ENCOUNTER — Other Ambulatory Visit: Payer: Self-pay

## 2019-06-17 DIAGNOSIS — B029 Zoster without complications: Secondary | ICD-10-CM | POA: Diagnosis not present

## 2019-06-17 DIAGNOSIS — G8929 Other chronic pain: Secondary | ICD-10-CM

## 2019-06-17 MED ORDER — DICLOFENAC SODIUM 75 MG PO TBEC: DELAYED_RELEASE_TABLET | ORAL | 0 refills | Status: DC

## 2019-06-17 NOTE — Telephone Encounter (Signed)
Patient called today in regards to refill request She stated that she is going out of town on Wednesday and would really like the prescription sent in today  Patient stated she is needing diclofenac (VOLTAREN) 75 MG EC tablet

## 2019-06-25 ENCOUNTER — Telehealth: Payer: Self-pay

## 2019-06-25 NOTE — Telephone Encounter (Signed)
Pt left v/m that pt was treated for shingles on 06/14/19 and pt has finished the med and pt thinks she is OK. Pt also saw eye dr at [facility] and it was confirmed her eyes are OK; Shingles have not affected her eyes. Pt wanted Anda Kraft to know.

## 2019-06-25 NOTE — Telephone Encounter (Signed)
Noted and appreciate the update.

## 2019-07-01 ENCOUNTER — Other Ambulatory Visit: Payer: Self-pay | Admitting: Primary Care

## 2019-07-01 DIAGNOSIS — M8580 Other specified disorders of bone density and structure, unspecified site: Secondary | ICD-10-CM

## 2019-07-01 DIAGNOSIS — Z853 Personal history of malignant neoplasm of breast: Secondary | ICD-10-CM

## 2019-07-08 ENCOUNTER — Other Ambulatory Visit: Payer: Self-pay | Admitting: Primary Care

## 2019-07-08 DIAGNOSIS — J4540 Moderate persistent asthma, uncomplicated: Secondary | ICD-10-CM

## 2019-07-11 DIAGNOSIS — H353221 Exudative age-related macular degeneration, left eye, with active choroidal neovascularization: Secondary | ICD-10-CM | POA: Diagnosis not present

## 2019-07-29 DIAGNOSIS — S70361D Insect bite (nonvenomous), right thigh, subsequent encounter: Secondary | ICD-10-CM | POA: Diagnosis not present

## 2019-07-29 DIAGNOSIS — C4441 Basal cell carcinoma of skin of scalp and neck: Secondary | ICD-10-CM | POA: Diagnosis not present

## 2019-08-28 DIAGNOSIS — H353221 Exudative age-related macular degeneration, left eye, with active choroidal neovascularization: Secondary | ICD-10-CM | POA: Diagnosis not present

## 2019-08-30 DIAGNOSIS — C4441 Basal cell carcinoma of skin of scalp and neck: Secondary | ICD-10-CM | POA: Diagnosis not present

## 2019-09-24 ENCOUNTER — Telehealth: Payer: Self-pay | Admitting: Pulmonary Disease

## 2019-09-24 MED ORDER — DOXYCYCLINE HYCLATE 100 MG PO TABS: 100.0000 mg | ORAL_TABLET | Freq: Two times a day (BID) | ORAL | 0 refills | Status: DC

## 2019-09-24 NOTE — Telephone Encounter (Signed)
Will send in RX for doxycyline 1 tab twice daily x 1 week. Make sure she is still receiving dupixent injections and taking her Symbicort twice daily. IF she hasnt been vaccinated for covid she should be tested. If she develops fever or does not improve notify office

## 2019-09-24 NOTE — Telephone Encounter (Signed)
Patient called reporting contact with sick family members. She started with a sore throat 5 days ago and now has a cough with clear mucus, no fever, not wheezing, right pain in arm pit, chest feels raw from coughing. She is not taking any medication for symptoms. Please advise.

## 2019-09-24 NOTE — Telephone Encounter (Signed)
Spoke with pt, aware of recs.  Nothing further needed at this time- will close encounter.   

## 2019-09-26 ENCOUNTER — Other Ambulatory Visit: Payer: Self-pay | Admitting: Primary Care

## 2019-09-26 DIAGNOSIS — M5442 Lumbago with sciatica, left side: Secondary | ICD-10-CM

## 2019-09-26 DIAGNOSIS — J309 Allergic rhinitis, unspecified: Secondary | ICD-10-CM

## 2019-09-26 DIAGNOSIS — J4540 Moderate persistent asthma, uncomplicated: Secondary | ICD-10-CM

## 2019-10-01 ENCOUNTER — Telehealth: Payer: Self-pay | Admitting: Primary Care

## 2019-10-01 ENCOUNTER — Telehealth: Payer: Self-pay | Admitting: Pulmonary Disease

## 2019-10-01 MED ORDER — FLUCONAZOLE 150 MG PO TABS: 150.0000 mg | ORAL_TABLET | Freq: Every day | ORAL | 0 refills | Status: DC

## 2019-10-01 NOTE — Telephone Encounter (Signed)
Called and spoke with pt who stated she took the last of her doxycycline this morning and stated she started having burning with urination as well as back pain 2 days ago which was due to the doxycycline abx Beth prescribed to her on 6/29.  Pt stated that she went to the pharmacy to speak with pharmacist and they told her to try taking a probiotic. Pt states that the probiotic has not really helped and is wanting to know if there is anything we can recommend to help.  Dr. Halford Chessman, please advise.

## 2019-10-01 NOTE — Telephone Encounter (Signed)
Noted. Patient called and rescheduled for Thursday 10/03/2019

## 2019-10-01 NOTE — Telephone Encounter (Signed)
It appears that she was prescribed fluconazole by her pulmonologist for presumed vaginal yeast infection. I am happy to see her tomorrow as scheduled, okay to cancel if she is doing better with the fluconazole.

## 2019-10-01 NOTE — Telephone Encounter (Addendum)
Please advise. Schedule is full for the rest of today

## 2019-10-01 NOTE — Telephone Encounter (Signed)
Called and spoke with pt letting her know the info stated by VS and she verbalized understanding. Rx for one dose of fluconazole 150mg  has been sent to pharmacy for pt. Nothing further needed.

## 2019-10-01 NOTE — Telephone Encounter (Signed)
Sounds more likely she has yeast infection from being on antibiotic.  Please send script for fluconazole 150 mg pill x one dose.  If symptoms don't improve, then she should contact her PCP to get further urinalysis done.

## 2019-10-01 NOTE — Telephone Encounter (Signed)
Pt has been taking over the counter AZO for UTI sypmptoms and want to know if she can come in just for a urine today? Pt is scheduled for appt tomorrow at 11:40. Should she just keep this appointment? She was on an antibiotic and her her doctor think she has a yeast infection not UTI.

## 2019-10-02 ENCOUNTER — Ambulatory Visit: Payer: Medicare HMO | Admitting: Primary Care

## 2019-10-02 DIAGNOSIS — C4441 Basal cell carcinoma of skin of scalp and neck: Secondary | ICD-10-CM | POA: Diagnosis not present

## 2019-10-03 ENCOUNTER — Encounter: Payer: Self-pay | Admitting: Primary Care

## 2019-10-03 ENCOUNTER — Ambulatory Visit: Payer: Medicare HMO | Admitting: Primary Care

## 2019-10-03 ENCOUNTER — Other Ambulatory Visit: Payer: Self-pay

## 2019-10-03 VITALS — BP 124/84 | HR 100 | Temp 96.2°F | Ht 59.0 in | Wt 133.0 lb

## 2019-10-03 DIAGNOSIS — R300 Dysuria: Secondary | ICD-10-CM

## 2019-10-03 HISTORY — DX: Dysuria: R30.0

## 2019-10-03 LAB — POC URINALSYSI DIPSTICK (AUTOMATED)
Bilirubin, UA: NEGATIVE
Blood, UA: NEGATIVE
Glucose, UA: NEGATIVE
Ketones, UA: NEGATIVE
Leukocytes, UA: NEGATIVE
Nitrite, UA: NEGATIVE
Protein, UA: POSITIVE — AB
Spec Grav, UA: >=1.03 — AB (ref 1.010–1.025)
Urobilinogen, UA: 0.2 E.U./dL
pH, UA: 5.5 (ref 5.0–8.0)

## 2019-10-03 NOTE — Patient Instructions (Addendum)
We will be in touch once we receive your urine culture results.  Be sure to stay hydrated with water.  It was a pleasure to see you today!

## 2019-10-03 NOTE — Progress Notes (Signed)
Subjective:    Patient ID: [name], female    DOB: 1940/10/16, 79 y.o.   MRN: [id number]  HPI  This visit occurred during the SARS-CoV-2 public health emergency.  Safety protocols were in place, including screening questions prior to the visit, additional usage of staff PPE, and extensive cleaning of exam room while observing appropriate contact time as indicated for disinfecting solutions.   [name] is a 79 year old female with a history of asthma, allergic rhinitis, hyperlipidemia, anemia, chronic back pain who presents today with a chief complaint of dysuria.  She phoned her pulmonologist's office on 09/24/19 reporting a five day history of cough, sore throat, exposure to a sick family member. Doxycycline antibiotics were sent to her pharmacy, also Covid-19 testing recommended. She phoned in again on 10/01/19 with reports of dysuria and low back pain. Her pulmonologist suspected antibiotic induced vaginal yeast infection so fluconazole was sent to her pharmacy.   The patient also called our office on 10/01/19 requesting urinalysis for testing. We encouraged her to schedule a visit given recent antibiotic and antifungal use.  Today she endorses symptoms of mild urinary discomfort with urination, but most of her symptoms have resolved. She denies dysuria, hematuria, pelvic pressure, low back pain, vaginal itching, vaginal discharge. She took AZO OTC for 3-4 days, none today. She completed the doxycycline and fluconazole.    Review of Systems  Constitutional: Negative for fever.  Gastrointestinal: Negative for abdominal pain and nausea.  Genitourinary: Negative for dysuria, flank pain, hematuria, urgency, vaginal bleeding and vaginal discharge.       Past Medical History:  Diagnosis Date   Arthritis    Asthma    Cancer of breast ([place])    33 years ago, left,    Hearing loss    both ears, wwears hearing aides   Hyperlipidemia    Macular degeneration    Osteopenia     Pneumonia      Social History   Socioeconomic History   Marital status: Married    Spouse name: Not on file   Number of children: Not on file   Years of education: Not on file   Highest education level: Not on file  Occupational History   Occupation: retired  Tobacco Use   Smoking status: Never Smoker   Smokeless tobacco: Never Used  Scientific laboratory technician Use: Never used  Substance and Sexual Activity   Alcohol use: Yes    Comment: occasionally   Drug use: No   Sexual activity: Not Currently  Other Topics Concern   Not on file  Social History Narrative   Married.   2 children, 6 grandchildren.   Retired. Previously was Pharmacist, hospital.    Enjoys participating in church.    Social Determinants of Health   Financial Resource Strain: Low Risk    Difficulty of Paying Living Expenses: Not hard at all  Food Insecurity: No Food Insecurity   Worried About Charity fundraiser in the Last Year: Never true   [place] in the Last Year: Never true  Transportation Needs: No Transportation Needs   Lack of Transportation (Medical): No   Lack of Transportation (Non-Medical): No  Physical Activity: Inactive   Days of Exercise per Week: 0 days   Minutes of Exercise per Session: 0 min  Stress: No Stress Concern Present   Feeling of Stress : Not at all  Social Connections:    Frequency of Communication with Friends and Family:  Frequency of Social Gatherings with Friends and Family:    Attends Religious Services:    Active Member of Clubs or Organizations:    Attends Music therapist:    Marital Status:   Intimate Partner Violence: Not At Risk   Fear of Current or Ex-Partner: No   Emotionally Abused: No   Physically Abused: No   Sexually Abused: No    Past Surgical History:  Procedure Laterality Date   ABDOMINAL HYSTERECTOMY     BREAST ENHANCEMENT SURGERY     CESAREAN SECTION     x 2   COLONOSCOPY WITH PROPOFOL N/A 04/21/2014    Procedure: COLONOSCOPY WITH PROPOFOL;  Surgeon: Garlan Fair, MD;  Location: [facility];  Service: Endoscopy;  Laterality: N/A;   EYE SURGERY Bilateral march 2015 and june 2015   lens for cataracts   LEFT HEART CATHETERIZATION WITH CORONARY ANGIOGRAM N/A 01/24/2012   Procedure: LEFT HEART CATHETERIZATION WITH CORONARY ANGIOGRAM;  Surgeon: Sueanne Margarita, MD;  Location: [facility];  Service: Cardiovascular;  Laterality: N/A;   MASTECTOMY  33 yrs ago   bilateral    Family History  Problem Relation Age of Onset   Macular degeneration Mother    Heart attack Father     Allergies  Allergen Reactions   Fosamax [Alendronate Sodium] Nausea Only   Sulfonamide Derivatives Rash    Current Outpatient Medications on File Prior to Visit  Medication Sig Dispense Refill   Cholecalciferol (VITAMIN D) 1000 UNITS capsule Take 1,000 Units by mouth daily with breakfast.      diclofenac (VOLTAREN) 75 MG EC tablet TAKE 1 TABLET BY MOUTH TWICE DAILY AS NEEDED 180 tablet 1   DUPIXENT 300 MG/2ML prefilled syringe INJECT 300MG  (1 SYRINGE) SUBCUTANEOUSLY EVERY OTHER WEEK. 4 mL 5   fluticasone (FLONASE) 50 MCG/ACT nasal spray Place 1 spray into both nostrils 2 (two) times daily.     levocetirizine (XYZAL) 5 MG tablet TAKE 1 TABLET BY MOUTH EVERY DAY IN THE EVENING FOR ALLERGIES. 90 tablet 3   montelukast (SINGULAIR) 10 MG tablet TAKE 1 TABLET BY MOUTH EVERYDAY AT BEDTIME 90 tablet 1   Multiple Vitamins-Minerals (MULTI FOR HER 50+) TABS Take 1 tablet by mouth daily.     Multiple Vitamins-Minerals (PRESERVISION/LUTEIN) CAPS Take 1 capsule by mouth 2 (two) times daily.     raloxifene (EVISTA) 60 MG tablet TAKE 1 TABLET (60 MG TOTAL) BY MOUTH DAILY WITH BREAKFAST. 90 tablet 1   SYMBICORT 160-4.5 MCG/ACT inhaler INHALE 2 PUFFS INTO THE LUNGS TWO TIMES DAILY 10.2 Inhaler 5   valACYclovir (VALTREX) 1000 MG tablet Take 1 tablet (1,000 mg total) by mouth 3 (three) times daily. 21 tablet 0    No current facility-administered medications on file prior to visit.    BP 124/84    Pulse 100    Temp (!) 96.2 F (35.7 C) (Temporal)    Ht 4\' 11"  (1.499 m)    Wt 133 lb (60.3 kg)    SpO2 98%    BMI 26.86 kg/m    Objective:   Physical Exam Cardiovascular:     Rate and Rhythm: Normal rate and regular rhythm.  Pulmonary:     Effort: Pulmonary effort is normal.     Breath sounds: Normal breath sounds. No rhonchi.  Abdominal:     Tenderness: There is no right CVA tenderness or left CVA tenderness.  Musculoskeletal:     Cervical back: Neck supple.  Skin:    General: Skin is warm and  dry.            Assessment & Plan:

## 2019-10-03 NOTE — Assessment & Plan Note (Signed)
Mostly resolved after taking Doxycycline and Diflucan prescribed by pulmonology. Exam today unremarkable.  UA today negative. Culture sent.  Encouraged hydration with water.

## 2019-10-04 LAB — URINE CULTURE
MICRO NUMBER:: 10680982
SPECIMEN QUALITY:: ADEQUATE

## 2019-10-30 DIAGNOSIS — H353221 Exudative age-related macular degeneration, left eye, with active choroidal neovascularization: Secondary | ICD-10-CM | POA: Diagnosis not present

## 2019-12-16 ENCOUNTER — Other Ambulatory Visit: Payer: Self-pay | Admitting: Primary Care

## 2019-12-16 DIAGNOSIS — M8580 Other specified disorders of bone density and structure, unspecified site: Secondary | ICD-10-CM

## 2019-12-16 DIAGNOSIS — Z853 Personal history of malignant neoplasm of breast: Secondary | ICD-10-CM

## 2019-12-18 DIAGNOSIS — H353221 Exudative age-related macular degeneration, left eye, with active choroidal neovascularization: Secondary | ICD-10-CM | POA: Diagnosis not present

## 2020-01-01 ENCOUNTER — Ambulatory Visit: Payer: Medicare HMO | Admitting: Pulmonary Disease

## 2020-01-01 ENCOUNTER — Other Ambulatory Visit: Payer: Self-pay

## 2020-01-01 ENCOUNTER — Encounter: Payer: Self-pay | Admitting: Pulmonary Disease

## 2020-01-01 VITALS — BP 120/72 | HR 85 | Temp 97.7°F | Ht 59.5 in | Wt 133.6 lb

## 2020-01-01 DIAGNOSIS — Z7189 Other specified counseling: Secondary | ICD-10-CM

## 2020-01-01 DIAGNOSIS — J309 Allergic rhinitis, unspecified: Secondary | ICD-10-CM

## 2020-01-01 DIAGNOSIS — J8283 Eosinophilic asthma: Secondary | ICD-10-CM

## 2020-01-01 DIAGNOSIS — J479 Bronchiectasis, uncomplicated: Secondary | ICD-10-CM

## 2020-01-01 DIAGNOSIS — Z23 Encounter for immunization: Secondary | ICD-10-CM | POA: Diagnosis not present

## 2020-01-01 DIAGNOSIS — J4530 Mild persistent asthma, uncomplicated: Secondary | ICD-10-CM | POA: Diagnosis not present

## 2020-01-01 NOTE — Progress Notes (Signed)
[facility]  Chief Complaint  Patient presents with  . Follow-up    no complaints     Constitutional:  BP 120/72 (BP Location: Right Arm, Cuff Size: Normal)   Pulse 85   Temp 97.7 F (36.5 C) (Temporal)   Ht 4' 11.5" (1.511 m)   Wt 133 lb 9.6 oz (60.6 kg)   SpO2 99%   BMI 26.53 kg/m   Past Medical History:  Breast cancer, HLD, Macular degeneration, Osteopenia  Past Surgical History:  Her  has a past surgical history that includes Abdominal hysterectomy; Cesarean section; Breast enhancement surgery; left heart catheterization with coronary angiogram (N/A, 01/24/2012); Mastectomy (33 yrs ago); Eye surgery (Bilateral, march 2015 and june 2015); and Colonoscopy with propofol (N/A, 04/21/2014).  Brief Summary:  [name] is a 79 y.o. 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 She had pneumonia with empyema in June 2016.      Subjective:   She is doing well.  Not having sinus congestion, runny nose, or sore throat.  Has a little more cough with chest congestion with change from Summer to Fall.  Tried at various times stopping symbicort, singulair, and xyzal.  Each time her symptoms got worse off the medicine, and better when restarting the medicine.  She denies skin rash, or hives.  No issue with home injection of dupixent.  Physical Exam:   Appearance - well kempt   ENMT - no sinus tenderness, no oral exudate, no LAN, Mallampati 4 airway, no stridor, scalloped tongue  Respiratory - equal breath sounds bilaterally, no wheezing or rales  CV - s1s2 regular rate and rhythm, no murmurs  Ext - no clubbing, no edema  Skin - no rashes  Psych - normal mood and affect   Pulmonary testing:   Spirometry 12/07/11>>1.31 (71%), FEV1% 66  Exhaled nitric oxide 12/07/11>>8 ppb (normal)  PFT 12/05/12 >> FEV1 1.49 (82%), FEV1% 69, FEF 25-75% 1.09 (69%), TLC 4.38 (85%), DLCO 87%, borderline BD response  Rt pleural fluid 08/08/14 >>  glucose 59, protein <3, LDH 6800, WBC 16,640 (86%N), cytology negative for malignancy  Rt pleural fluid 08/28/14 >> glucose 59, LDH 178, protein 3.5, WBC 303 (68%L)  RAST 06/21/17 >> dust mites, IgE 14  PFT 08/23/17 >> FEV1 1.21 (73%), FEV1% 65, TLC 4.02 (91%), DLCO 71%, no BD  Chest Imaging:   CT chest 08/02/14 >> RML consolidation, small Rt pleural effusion  CT chest 08/09/14 >> Rt lower lobe thin walled cavity, scattered GGO and interlobular septal thickening  CT chest 09/18/14 >> mild Rt pleural thickening, small Rt pleural effusion, 4 mm nodule LUL, improved RML consolidation, mild RML BTX, RLL cavity 2.5 cm from 3.1 cm, mild BTX lingula  CT chest 10/16/14 >> decreased RLL PNA w/o cavity  CT chest 04/27/16 >> 4 mm LUL nodule stable, mild BTX and scarring b/l  Sleep Tests:     Cardiac Tests:   Lt heart cath 01/24/12 >> Normal coronaries, LVEDP 8 mmHg, EF 55%  Echo 08/03/14 >> EF 50 to 55%, mild MR  Social History:  She  reports that she has never smoked. She has never used smokeless tobacco. She reports current alcohol use. She reports that she does not use drugs.  Family History:  Her family history includes Heart attack in her father; Macular degeneration in her mother.     Assessment/Plan:   Moderate persistent asthma with eosinophilic phenotype. - started on dupixent December 2019, doing home injections - symbicort,  singulair, prn albuterol - high dose influenza vaccine today  Allergic rhinitis. - singulair, xyzal  Bronchiectasis. - likely related to previous episode of pneumonia - bronchial hygiene  COVID 19 advice. - she completed initial dosing of Pfizer vaccine in February 2021 - I have advised her to arrange for booster Pfizer vaccine later this month  Time Spent Involved in Patient Care on Day of Examination:  22 minutes  Follow up:  Patient Instructions  High dose flu shot today  Look into getting your Pfizer COVID vaccine booster later this  month  Follow up in 1 year   Medication List:   Allergies as of 01/01/2020      Reactions   Fosamax [alendronate Sodium] Nausea Only   Sulfonamide Derivatives Rash      Medication List       Accurate as of January 01, 2020 10:47 AM. If you have any questions, ask your nurse or doctor.        STOP taking these medications   fluticasone 50 MCG/ACT nasal spray Commonly known as: FLONASE Stopped by: [name], MD     TAKE these medications   diclofenac 75 MG EC tablet Commonly known as: VOLTAREN TAKE 1 TABLET BY MOUTH TWICE DAILY AS NEEDED   Dupixent 300 MG/2ML prefilled syringe Generic drug: dupilumab INJECT 300MG  (1 SYRINGE) SUBCUTANEOUSLY EVERY OTHER WEEK.   levocetirizine 5 MG tablet Commonly known as: XYZAL TAKE 1 TABLET BY MOUTH EVERY DAY IN THE EVENING FOR ALLERGIES.   montelukast 10 MG tablet Commonly known as: SINGULAIR TAKE 1 TABLET BY MOUTH EVERYDAY AT BEDTIME   PreserVision/Lutein Caps Take 1 capsule by mouth 2 (two) times daily.   Multi For Her 50+ Tabs Take 1 tablet by mouth daily.   raloxifene 60 MG tablet Commonly known as: EVISTA TAKE 1 TABLET BY MOUTH EVERY DAY WITH BREAKFAST   Symbicort 160-4.5 MCG/ACT inhaler Generic drug: budesonide-formoterol INHALE 2 PUFFS INTO THE LUNGS TWO TIMES DAILY   valACYclovir 1000 MG tablet Commonly known as: VALTREX Take 1 tablet (1,000 mg total) by mouth 3 (three) times daily.   Vitamin D 1000 units capsule Take 1,000 Units by mouth daily with breakfast.       Signature:  [name], MD [place] Pager - [phone] 01/01/2020, 10:47 AM

## 2020-01-01 NOTE — Patient Instructions (Signed)
High dose flu shot today  Look into getting your Pfizer COVID vaccine booster later this month  Follow up in 1 year

## 2020-01-18 ENCOUNTER — Other Ambulatory Visit: Payer: Self-pay | Admitting: Primary Care

## 2020-01-18 DIAGNOSIS — J4540 Moderate persistent asthma, uncomplicated: Secondary | ICD-10-CM

## 2020-01-21 NOTE — Telephone Encounter (Signed)
Please send to pulmonology

## 2020-01-21 NOTE — Telephone Encounter (Signed)
Patient is being followed by Pulmonology ok to refill or does she need to get from them?

## 2020-02-02 ENCOUNTER — Other Ambulatory Visit: Payer: Self-pay | Admitting: Primary Care

## 2020-02-02 DIAGNOSIS — J4540 Moderate persistent asthma, uncomplicated: Secondary | ICD-10-CM

## 2020-02-05 DIAGNOSIS — H353221 Exudative age-related macular degeneration, left eye, with active choroidal neovascularization: Secondary | ICD-10-CM | POA: Diagnosis not present

## 2020-03-11 DIAGNOSIS — H26493 Other secondary cataract, bilateral: Secondary | ICD-10-CM | POA: Diagnosis not present

## 2020-03-25 DIAGNOSIS — H353221 Exudative age-related macular degeneration, left eye, with active choroidal neovascularization: Secondary | ICD-10-CM | POA: Diagnosis not present

## 2020-04-22 DIAGNOSIS — L570 Actinic keratosis: Secondary | ICD-10-CM | POA: Diagnosis not present

## 2020-04-22 DIAGNOSIS — C4441 Basal cell carcinoma of skin of scalp and neck: Secondary | ICD-10-CM | POA: Diagnosis not present

## 2020-04-22 DIAGNOSIS — X32XXXD Exposure to sunlight, subsequent encounter: Secondary | ICD-10-CM | POA: Diagnosis not present

## 2020-05-04 DIAGNOSIS — C4441 Basal cell carcinoma of skin of scalp and neck: Secondary | ICD-10-CM | POA: Diagnosis not present

## 2020-05-20 DIAGNOSIS — H353221 Exudative age-related macular degeneration, left eye, with active choroidal neovascularization: Secondary | ICD-10-CM | POA: Diagnosis not present

## 2020-05-25 ENCOUNTER — Other Ambulatory Visit: Payer: Self-pay | Admitting: *Deleted

## 2020-05-25 MED ORDER — DUPIXENT 300 MG/2ML ~~LOC~~ SOSY: PREFILLED_SYRINGE | SUBCUTANEOUS | 3 refills | Status: DC

## 2020-06-03 DIAGNOSIS — C4441 Basal cell carcinoma of skin of scalp and neck: Secondary | ICD-10-CM | POA: Diagnosis not present

## 2020-06-05 ENCOUNTER — Other Ambulatory Visit: Payer: Self-pay | Admitting: Primary Care

## 2020-06-05 DIAGNOSIS — G8929 Other chronic pain: Secondary | ICD-10-CM

## 2020-06-05 DIAGNOSIS — M8580 Other specified disorders of bone density and structure, unspecified site: Secondary | ICD-10-CM

## 2020-06-05 DIAGNOSIS — Z853 Personal history of malignant neoplasm of breast: Secondary | ICD-10-CM

## 2020-06-05 NOTE — Telephone Encounter (Signed)
Called and patient scheduled 4/29 with [facility] nurse and 5/6 with PCP.

## 2020-06-05 NOTE — Telephone Encounter (Signed)
Due for office visit/CPE, please schedule. She will need this for further refills

## 2020-06-25 ENCOUNTER — Other Ambulatory Visit: Payer: Self-pay | Admitting: Primary Care

## 2020-06-25 DIAGNOSIS — J4540 Moderate persistent asthma, uncomplicated: Secondary | ICD-10-CM

## 2020-06-25 DIAGNOSIS — J309 Allergic rhinitis, unspecified: Secondary | ICD-10-CM

## 2020-06-28 ENCOUNTER — Other Ambulatory Visit: Payer: Self-pay | Admitting: Primary Care

## 2020-06-28 DIAGNOSIS — J309 Allergic rhinitis, unspecified: Secondary | ICD-10-CM

## 2020-07-08 DIAGNOSIS — H353221 Exudative age-related macular degeneration, left eye, with active choroidal neovascularization: Secondary | ICD-10-CM | POA: Diagnosis not present

## 2020-07-12 ENCOUNTER — Other Ambulatory Visit: Payer: Self-pay | Admitting: Primary Care

## 2020-07-12 DIAGNOSIS — Z853 Personal history of malignant neoplasm of breast: Secondary | ICD-10-CM

## 2020-07-12 DIAGNOSIS — G8929 Other chronic pain: Secondary | ICD-10-CM

## 2020-07-12 DIAGNOSIS — M8580 Other specified disorders of bone density and structure, unspecified site: Secondary | ICD-10-CM

## 2020-07-22 DIAGNOSIS — Z08 Encounter for follow-up examination after completed treatment for malignant neoplasm: Secondary | ICD-10-CM | POA: Diagnosis not present

## 2020-07-22 DIAGNOSIS — D485 Neoplasm of uncertain behavior of skin: Secondary | ICD-10-CM | POA: Diagnosis not present

## 2020-07-24 ENCOUNTER — Other Ambulatory Visit: Payer: Self-pay

## 2020-07-24 ENCOUNTER — Ambulatory Visit (INDEPENDENT_AMBULATORY_CARE_PROVIDER_SITE_OTHER): Payer: Medicare HMO

## 2020-07-24 DIAGNOSIS — Z0000 Encounter for general adult medical examination without abnormal findings: Secondary | ICD-10-CM

## 2020-07-24 NOTE — Progress Notes (Signed)
PCP notes:  Health Maintenance: Mammogram- due   Abnormal Screenings: none   Patient concerns: Discuss taking tylenol and ibuprofen for her arthritis   Nurse concerns: none   Next PCP appt.: 08/06/2020 @ 9 am

## 2020-07-24 NOTE — Patient Instructions (Signed)
[name] , Thank you for taking time to come for your Medicare Wellness Visit. I appreciate your ongoing commitment to your health goals. Please review the following plan we discussed and let me know if I can assist you in the future.   Screening recommendations/referrals: Colonoscopy: no longer required  Mammogram: due, will discuss with provider at physical Bone Density: Up to date, completed 08/28/2018, due 08/2020 Recommended yearly ophthalmology/optometry visit for glaucoma screening and checkup Recommended yearly dental visit for hygiene and checkup  Vaccinations: Influenza vaccine: Up to date, completed 01/01/2020, due 10/2020 Pneumococcal vaccine: Completed series Tdap vaccine: decline-insurance Shingles vaccine: due, check with your insurance regarding coverage if interested   Covid-19:Completed series  Advanced directives: Advance directive discussed with you today. Even though you declined this today please call our office should you change your mind and we can give you the proper paperwork for you to fill out.  Conditions/risks identified: hyperlipidemia   Next appointment: Follow up in one year for your annual wellness visit    Preventive Care 65 Years and Older, Female Preventive care refers to lifestyle choices and visits with your health care provider that can promote health and wellness. What does preventive care include?  A yearly physical exam. This is also called an annual well check.  Dental exams once or twice a year.  Routine eye exams. Ask your health care provider how often you should have your eyes checked.  Personal lifestyle choices, including:  Daily care of your teeth and gums.  Regular physical activity.  Eating a healthy diet.  Avoiding tobacco and drug use.  Limiting alcohol use.  Practicing safe sex.  Taking low-dose aspirin every day.  Taking vitamin and mineral supplements as recommended by your health care provider. What happens  during an annual well check? The services and screenings done by your health care provider during your annual well check will depend on your age, overall health, lifestyle risk factors, and family history of disease. Counseling  Your health care provider may ask you questions about your:  Alcohol use.  Tobacco use.  Drug use.  Emotional well-being.  Home and relationship well-being.  Sexual activity.  Eating habits.  History of falls.  Memory and ability to understand (cognition).  Work and work Statistician.  Reproductive health. Screening  You may have the following tests or measurements:  Height, weight, and BMI.  Blood pressure.  Lipid and cholesterol levels. These may be checked every 5 years, or more frequently if you are over 28 years old.  Skin check.  Lung cancer screening. You may have this screening every year starting at age 59 if you have a 30-pack-year history of smoking and currently smoke or have quit within the past 15 years.  Fecal occult blood test (FOBT) of the stool. You may have this test every year starting at age 57.  Flexible sigmoidoscopy or colonoscopy. You may have a sigmoidoscopy every 5 years or a colonoscopy every 10 years starting at age 69.  Hepatitis C blood test.  Hepatitis B blood test.  Sexually transmitted disease (STD) testing.  Diabetes screening. This is done by checking your blood sugar (glucose) after you have not eaten for a while (fasting). You may have this done every 1-3 years.  Bone density scan. This is done to screen for osteoporosis. You may have this done starting at age 80.  Mammogram. This may be done every 1-2 years. Talk to your health care provider about how often you should have regular  mammograms. Talk with your health care provider about your test results, treatment options, and if necessary, the need for more tests. Vaccines  Your health care provider may recommend certain vaccines, such  as:  Influenza vaccine. This is recommended every year.  Tetanus, diphtheria, and acellular pertussis (Tdap, Td) vaccine. You may need a Td booster every 10 years.  Zoster vaccine. You may need this after age 74.  Pneumococcal 13-valent conjugate (PCV13) vaccine. One dose is recommended after age 80.  Pneumococcal polysaccharide (PPSV23) vaccine. One dose is recommended after age 31. Talk to your health care provider about which screenings and vaccines you need and how often you need them. This information is not intended to replace advice given to you by your health care provider. Make sure you discuss any questions you have with your health care provider. Document Released: 04/10/2015 Document Revised: 12/02/2015 Document Reviewed: 01/13/2015 Elsevier Interactive Patient Education  2017 [place] Prevention in the Home Falls can cause injuries. They can happen to people of all ages. There are many things you can do to make your home safe and to help prevent falls. What can I do on the outside of my home?  Regularly fix the edges of walkways and driveways and fix any cracks.  Remove anything that might make you trip as you walk through a door, such as a raised step or threshold.  Trim any bushes or trees on the path to your home.  Use bright outdoor lighting.  Clear any walking paths of anything that might make someone trip, such as rocks or tools.  Regularly check to see if handrails are loose or broken. Make sure that both sides of any steps have handrails.  Any raised decks and porches should have guardrails on the edges.  Have any leaves, snow, or ice cleared regularly.  Use sand or salt on walking paths during winter.  Clean up any spills in your garage right away. This includes oil or grease spills. What can I do in the bathroom?  Use night lights.  Install grab bars by the toilet and in the tub and shower. Do not use towel bars as grab bars.  Use  non-skid mats or decals in the tub or shower.  If you need to sit down in the shower, use a plastic, non-slip stool.  Keep the floor dry. Clean up any water that spills on the floor as soon as it happens.  Remove soap buildup in the tub or shower regularly.  Attach bath mats securely with double-sided non-slip rug tape.  Do not have throw rugs and other things on the floor that can make you trip. What can I do in the bedroom?  Use night lights.  Make sure that you have a light by your bed that is easy to reach.  Do not use any sheets or blankets that are too big for your bed. They should not hang down onto the floor.  Have a firm chair that has side arms. You can use this for support while you get dressed.  Do not have throw rugs and other things on the floor that can make you trip. What can I do in the kitchen?  Clean up any spills right away.  Avoid walking on wet floors.  Keep items that you use a lot in easy-to-reach places.  If you need to reach something above you, use a strong step stool that has a grab bar.  Keep electrical cords out of the way.  Do not use floor polish or wax that makes floors slippery. If you must use wax, use non-skid floor wax.  Do not have throw rugs and other things on the floor that can make you trip. What can I do with my stairs?  Do not leave any items on the stairs.  Make sure that there are handrails on both sides of the stairs and use them. Fix handrails that are broken or loose. Make sure that handrails are as long as the stairways.  Check any carpeting to make sure that it is firmly attached to the stairs. Fix any carpet that is loose or worn.  Avoid having throw rugs at the top or bottom of the stairs. If you do have throw rugs, attach them to the floor with carpet tape.  Make sure that you have a light switch at the top of the stairs and the bottom of the stairs. If you do not have them, ask someone to add them for you. What  else can I do to help prevent falls?  Wear shoes that:  Do not have high heels.  Have rubber bottoms.  Are comfortable and fit you well.  Are closed at the toe. Do not wear sandals.  If you use a stepladder:  Make sure that it is fully opened. Do not climb a closed stepladder.  Make sure that both sides of the stepladder are locked into place.  Ask someone to hold it for you, if possible.  Clearly mark and make sure that you can see:  Any grab bars or handrails.  First and last steps.  Where the edge of each step is.  Use tools that help you move around (mobility aids) if they are needed. These include:  Canes.  Walkers.  Scooters.  Crutches.  Turn on the lights when you go into a dark area. Replace any light bulbs as soon as they burn out.  Set up your furniture so you have a clear path. Avoid moving your furniture around.  If any of your floors are uneven, fix them.  If there are any pets around you, be aware of where they are.  Review your medicines with your doctor. Some medicines can make you feel dizzy. This can increase your chance of falling. Ask your doctor what other things that you can do to help prevent falls. This information is not intended to replace advice given to you by your health care provider. Make sure you discuss any questions you have with your health care provider. Document Released: 01/08/2009 Document Revised: 08/20/2015 Document Reviewed: 04/18/2014 Elsevier Interactive Patient Education  2017 [employer].

## 2020-07-24 NOTE — Progress Notes (Signed)
Subjective:   [name] is a 80 y.o. female who presents for Medicare Annual (Subsequent) preventive examination.  Review of Systems: N/A      I connected with the patient today by telephone and verified that I am speaking with the correct person using two identifiers. Location patient: home Location nurse: work Persons participating in the telephone visit: patient, nurse.   I discussed the limitations, risks, security and privacy concerns of performing an evaluation and management service by telephone and the availability of in person appointments. I also discussed with the patient that there may be a patient responsible charge related to this service. The patient expressed understanding and verbally consented to this telephonic visit.        Cardiac Risk Factors include: advanced age (>82men, >66 women);Other (see comment), Risk factor comments: hyperlipidemia     Objective:    Today's Vitals   There is no height or weight on file to calculate BMI.  Advanced Directives 07/24/2020 06/10/2019 06/06/2018 05/31/2017 04/14/2015 08/27/2014 08/02/2014  Does Patient Have a Medical Advance Directive? No No No No No No No  Does patient want to make changes to medical advance directive? - - - Yes (MAU/Ambulatory/Procedural Areas - Information given) - - -  Would patient like information on creating a medical advance directive? No - Patient declined Yes (MAU/Ambulatory/Procedural Areas - Information given) Yes (MAU/Ambulatory/Procedural Areas - Information given) - - No - patient declined information -    Current Medications (verified) Outpatient Encounter Medications as of 07/24/2020  Medication Sig  . Cholecalciferol (VITAMIN D) 1000 UNITS capsule Take 1,000 Units by mouth daily with breakfast.   . diclofenac (VOLTAREN) 75 MG EC tablet TAKE 1 TABLET BY MOUTH TWICE A DAY AS NEEDED  . DUPIXENT 300 MG/2ML prefilled syringe INJECT 300MG  (1 SYRINGE) SUBCUTANEOUSLY EVERY OTHER WEEK.  . DUPIXENT  300 MG/2ML prefilled syringe INJECT 300MG  (1 SYRINGE) SUBCUTANEOUSLY EVERY OTHER WEEK.  Marland Kitchen levocetirizine (XYZAL) 5 MG tablet TAKE 1 TABLET BY MOUTH EVERY DAY IN THE EVENING FOR ALLERGIES.  Marland Kitchen montelukast (SINGULAIR) 10 MG tablet TAKE 1 TABLET BY MOUTH EVERYDAY AT BEDTIME  . Multiple Vitamins-Minerals (MULTI FOR HER 50+) TABS Take 1 tablet by mouth daily.  . Multiple Vitamins-Minerals (PRESERVISION/LUTEIN) CAPS Take 1 capsule by mouth 2 (two) times daily.  . raloxifene (EVISTA) 60 MG tablet TAKE 1 TABLET BY MOUTH EVERY DAY  . SYMBICORT 160-4.5 MCG/ACT inhaler INHALE 2 PUFFS INTO THE LUNGS TWO TIMES DAILY  . valACYclovir (VALTREX) 1000 MG tablet Take 1 tablet (1,000 mg total) by mouth 3 (three) times daily.   No facility-administered encounter medications on file as of 07/24/2020.    Allergies (verified) Fosamax [alendronate sodium] and Sulfonamide derivatives   History: Past Medical History:  Diagnosis Date  . Arthritis   . Asthma   . Cancer of breast ([place])    33 years ago, left,   . Hearing loss    both ears, wwears hearing aides  . Hyperlipidemia   . Macular degeneration   . Osteopenia   . Pneumonia    Past Surgical History:  Procedure Laterality Date  . ABDOMINAL HYSTERECTOMY    . BREAST ENHANCEMENT SURGERY    . CESAREAN SECTION     x 2  . COLONOSCOPY WITH PROPOFOL N/A 04/21/2014   Procedure: COLONOSCOPY WITH PROPOFOL;  Surgeon: Garlan Fair, MD;  Location: [facility];  Service: Endoscopy;  Laterality: N/A;  . EYE SURGERY Bilateral march 2015 and june 2015   lens for  cataracts  . LEFT HEART CATHETERIZATION WITH CORONARY ANGIOGRAM N/A 01/24/2012   Procedure: LEFT HEART CATHETERIZATION WITH CORONARY ANGIOGRAM;  Surgeon: Sueanne Margarita, MD;  Location: [facility];  Service: Cardiovascular;  Laterality: N/A;  . MASTECTOMY  33 yrs ago   bilateral   Family History  Problem Relation Age of Onset  . Macular degeneration Mother   . Heart attack Father    Social History    Socioeconomic History  . Marital status: Married    Spouse name: Not on file  . Number of children: Not on file  . Years of education: Not on file  . Highest education level: Not on file  Occupational History  . Occupation: retired  Tobacco Use  . Smoking status: Never Smoker  . Smokeless tobacco: Never Used  Vaping Use  . Vaping Use: Never used  Substance and Sexual Activity  . Alcohol use: Not Currently    Comment: occasionally  . Drug use: No  . Sexual activity: Not Currently  Other Topics Concern  . Not on file  Social History Narrative   Married.   2 children, 6 grandchildren.   Retired. Previously was Pharmacist, hospital.    Enjoys participating in church.    Social Determinants of Health   Financial Resource Strain: Low Risk   . Difficulty of Paying Living Expenses: Not hard at all  Food Insecurity: No Food Insecurity  . Worried About Charity fundraiser in the Last Year: Never true  . Ran Out of Food in the Last Year: Never true  Transportation Needs: No Transportation Needs  . Lack of Transportation (Medical): No  . Lack of Transportation (Non-Medical): No  Physical Activity: Insufficiently Active  . Days of Exercise per Week: 7 days  . Minutes of Exercise per Session: 20 min  Stress: No Stress Concern Present  . Feeling of Stress : Not at all  Social Connections: Not on file    Tobacco Counseling Counseling given: Not Answered   Clinical Intake:  Pre-visit preparation completed: Yes  Pain : No/denies pain     Nutritional Risks: None Diabetes: No  How often do you need to have someone help you when you read instructions, pamphlets, or other written materials from your doctor or pharmacy?: 1 - Never What is the last grade level you completed in school?: masters  Diabetic: No Nutrition Risk Assessment:  Has the patient had any N/V/D within the last 2 months?  No  Does the patient have any non-healing wounds?  No  Has the patient had any unintentional  weight loss or weight gain?  No   Diabetes:  Is the patient diabetic?  No  If diabetic, was a CBG obtained today?  N/A Did the patient bring in their glucometer from home?  N/A How often do you monitor your CBG's? N/A.   Financial Strains and Diabetes Management:  Are you having any financial strains with the device, your supplies or your medication? N/A.  Does the patient want to be seen by Chronic Care Management for management of their diabetes?  N/A Would the patient like to be referred to a Nutritionist or for Diabetic Management?  N/A   Interpreter Needed?: No  Information entered by :: CJohnson, LPN   Activities of Daily Living In your present state of health, do you have any difficulty performing the following activities: 07/24/2020  Hearing? Y  Comment wears hearing aids  Vision? Y  Comment macular degeneration  Difficulty concentrating or making decisions? N  Walking or climbing stairs? N  Dressing or bathing? N  Doing errands, shopping? N  Preparing Food and eating ? N  Using the Toilet? N  In the past six months, have you accidently leaked urine? Y  Comment wears a pad at night  Do you have problems with loss of bowel control? N  Managing your Medications? N  Managing your Finances? N  Housekeeping or managing your Housekeeping? N  Some recent data might be hidden    Patient Care Team: Pleas Koch, NP as PCP - General (Internal Medicine)  Indicate any recent Medical Services you may have received from other than [facility] providers in the past year (date may be approximate).     Assessment:   This is a routine wellness examination for [place].  Hearing/Vision screen  Hearing Screening   125Hz  250Hz  500Hz  1000Hz  2000Hz  3000Hz  4000Hz  6000Hz  8000Hz   Right ear:           Left ear:           Vision Screening Comments: Patient gets annual eye exams  Dietary issues and exercise activities discussed: Current Exercise Habits: Home exercise routine, Type of  exercise: walking, Time (Minutes): 20, Frequency (Times/Week): 7, Weekly Exercise (Minutes/Week): 140, Intensity: Moderate, Exercise limited by: None identified  Goals    . Increase physical activity     Starting 06/06/2018, I will continue to do physical therapy exercises for 10 minutes daily.     . Patient Stated     06/10/2019, I will maintain and continue medications as prescribed.     . Patient Stated     07/24/2020,  I will continue to walk daily for about 1 mile.      Depression Screen PHQ 2/9 Scores 07/24/2020 06/10/2019 06/06/2018 05/31/2017 10/10/2016  PHQ - 2 Score 0 0 0 0 0  PHQ- 9 Score 0 0 0 0 -    Fall Risk Fall Risk  07/24/2020 06/10/2019 06/06/2018 05/31/2017  Falls in the past year? 0 0 0 No  Number falls in past yr: 0 0 - -  Injury with Fall? 0 0 - -  Risk for fall due to : Medication side effect No Fall Risks - -  Follow up Falls evaluation completed;Falls prevention discussed Falls evaluation completed;Falls prevention discussed - -    FALL RISK PREVENTION PERTAINING TO THE HOME:  Any stairs in or around the home? Yes  If so, are there any without handrails? No  Home free of loose throw rugs in walkways, pet beds, electrical cords, etc? Yes  Adequate lighting in your home to reduce risk of falls? Yes   ASSISTIVE DEVICES UTILIZED TO PREVENT FALLS:  Life alert? No  Use of a cane, walker or w/c? No  Grab bars in the bathroom? No  Shower chair or bench in shower? No  Elevated toilet seat or a handicapped toilet? No   TIMED UP AND GO:  Was the test performed? N/A telephone visit.    Cognitive Function: MMSE - Mini Mental State Exam 07/24/2020 06/10/2019 06/06/2018 05/31/2017  Not completed: Refused - - -  Orientation to time - 5 5 5   Orientation to Place - 5 5 5   Registration - 3 3 3   Attention/ Calculation - 5 0 0  Recall - 3 3 3   Language- name 2 objects - - 0 0  Language- repeat - 1 1 1   Language- follow 3 step command - - 3 3  Language- read & follow  direction - -  0 0  Write a sentence - - 0 0  Copy design - - 0 0  Total score - - 20 20  Mini Cog  Mini-Cog screen was not completed. Patient refused. Maximum score is 22. A value of 0 denotes this part of the MMSE was not completed or the patient failed this part of the Mini-Cog screening.       Immunizations Immunization History  Administered Date(s) Administered  . Fluad Quad(high Dose 65+) 01/15/2019, 01/01/2020  . Influenza Split 12/07/2011  . Influenza Whole 03/11/2010  . Influenza, High Dose Seasonal PF 11/25/2013  . Influenza,inj,Quad PF,6+ Mos 12/26/2013, 12/02/2014, 01/05/2017, 01/16/2018  . Influenza-Unspecified 12/07/2011  . PFIZER(Purple Top)SARS-COV-2 Vaccination 04/20/2019, 05/12/2019, 01/27/2020  . Pneumococcal Conjugate-13 11/25/2013, 12/26/2013  . Pneumococcal Polysaccharide-23 03/28/2004, 05/10/2006, 01/16/2018  . Td 08/08/1999  . Tdap 03/28/2009, 08/20/2009  . Zoster 03/28/2009, 08/20/2009    TDAP status: Due, Education has been provided regarding the importance of this vaccine. Advised may receive this vaccine at local pharmacy or Health Dept. Aware to provide a copy of the vaccination record if obtained from local pharmacy or Health Dept. Verbalized acceptance and understanding.  Flu Vaccine status: Up to date  Pneumococcal vaccine status: Up to date  Covid-19 vaccine status: Completed vaccines  Qualifies for Shingles Vaccine? Yes   Zostavax completed Yes   Shingrix Completed?: No.    Education has been provided regarding the importance of this vaccine. Patient has been advised to call insurance company to determine out of pocket expense if they have not yet received this vaccine. Advised may also receive vaccine at local pharmacy or Health Dept. Verbalized acceptance and understanding.  Screening Tests Health Maintenance  Topic Date Due  . Hepatitis C Screening  Never done  . TETANUS/TDAP  07/24/2025 (Originally 08/21/2019)  . INFLUENZA VACCINE   10/26/2020  . DEXA SCAN  Completed  . COVID-19 Vaccine  Completed  . PNA vac Low Risk Adult  Completed  . HPV VACCINES  Aged Out    Health Maintenance  Health Maintenance Due  Topic Date Due  . Hepatitis C Screening  Never done    Colorectal cancer screening: No longer required.   Mammogram status: due, will discuss with provider at physical   Bone Density status: Completed 08/28/2018. Results reflect: Bone density results: OSTEOPENIA. Repeat every 2 years.  Lung Cancer Screening: (Low Dose CT Chest recommended if Age 33-80 years, 30 pack-year currently smoking OR have quit w/in 15 years.) does not qualify.  Additional Screening:  Hepatitis C Screening: does qualify; Completed due  Vision Screening: Recommended annual ophthalmology exams for early detection of glaucoma and other disorders of the eye. Is the patient up to date with their annual eye exam?  Yes  Who is the provider or what is the name of the office in which the patient attends annual eye exams? [facility], Dr. Edison Pace If pt is not established with a provider, would they like to be referred to a provider to establish care? No .   Dental Screening: Recommended annual dental exams for proper oral hygiene  Community Resource Referral / Chronic Care Management: CRR required this visit?  No   CCM required this visit?  No      Plan:     I have personally reviewed and noted the following in the patient's chart:   . Medical and social history . Use of alcohol, tobacco or illicit drugs  . Current medications and supplements . Functional ability and status . Nutritional  status . Physical activity . Advanced directives . List of other physicians . Hospitalizations, surgeries, and ER visits in previous 12 months . Vitals . Screenings to include cognitive, depression, and falls . Referrals and appointments  In addition, I have reviewed and discussed with patient certain preventive protocols, quality  metrics, and best practice recommendations. A written personalized care plan for preventive services as well as general preventive health recommendations were provided to patient.   Due to this being a telephonic visit, the after visit summary with patients personalized plan was offered to patient via office or my-chart. Patient preferred to pick up at office at next visit or via mychart.   [place], Leon, LPN   [id number]

## 2020-07-31 ENCOUNTER — Ambulatory Visit: Payer: Medicare HMO | Admitting: Primary Care

## 2020-08-06 ENCOUNTER — Encounter: Payer: Self-pay | Admitting: Primary Care

## 2020-08-06 ENCOUNTER — Other Ambulatory Visit: Payer: Self-pay

## 2020-08-06 ENCOUNTER — Ambulatory Visit: Payer: Medicare HMO | Admitting: Primary Care

## 2020-08-06 VITALS — BP 124/62 | HR 88 | Temp 96.8°F | Ht 59.5 in | Wt 136.0 lb

## 2020-08-06 DIAGNOSIS — E2839 Other primary ovarian failure: Secondary | ICD-10-CM

## 2020-08-06 DIAGNOSIS — E782 Mixed hyperlipidemia: Secondary | ICD-10-CM

## 2020-08-06 DIAGNOSIS — J309 Allergic rhinitis, unspecified: Secondary | ICD-10-CM

## 2020-08-06 DIAGNOSIS — J4540 Moderate persistent asthma, uncomplicated: Secondary | ICD-10-CM | POA: Diagnosis not present

## 2020-08-06 DIAGNOSIS — Z1159 Encounter for screening for other viral diseases: Secondary | ICD-10-CM | POA: Diagnosis not present

## 2020-08-06 DIAGNOSIS — Z0000 Encounter for general adult medical examination without abnormal findings: Secondary | ICD-10-CM | POA: Diagnosis not present

## 2020-08-06 DIAGNOSIS — M25571 Pain in right ankle and joints of right foot: Secondary | ICD-10-CM | POA: Diagnosis not present

## 2020-08-06 DIAGNOSIS — Z1231 Encounter for screening mammogram for malignant neoplasm of breast: Secondary | ICD-10-CM

## 2020-08-06 DIAGNOSIS — M8580 Other specified disorders of bone density and structure, unspecified site: Secondary | ICD-10-CM

## 2020-08-06 DIAGNOSIS — M5442 Lumbago with sciatica, left side: Secondary | ICD-10-CM | POA: Diagnosis not present

## 2020-08-06 DIAGNOSIS — Z853 Personal history of malignant neoplasm of breast: Secondary | ICD-10-CM | POA: Diagnosis not present

## 2020-08-06 DIAGNOSIS — M2550 Pain in unspecified joint: Secondary | ICD-10-CM | POA: Insufficient documentation

## 2020-08-06 DIAGNOSIS — Z23 Encounter for immunization: Secondary | ICD-10-CM

## 2020-08-06 DIAGNOSIS — G8929 Other chronic pain: Secondary | ICD-10-CM

## 2020-08-06 HISTORY — DX: Pain in unspecified joint: M25.50

## 2020-08-06 LAB — COMPREHENSIVE METABOLIC PANEL
ALT: 17 U/L (ref 0–35)
AST: 17 U/L (ref 0–37)
Albumin: 4.1 g/dL (ref 3.5–5.2)
Alkaline Phosphatase: 68 U/L (ref 39–117)
BUN: 20 mg/dL (ref 6–23)
CO2: 29 mEq/L (ref 19–32)
Calcium: 9.2 mg/dL (ref 8.4–10.5)
Chloride: 108 mEq/L (ref 96–112)
Creatinine, Ser: 0.64 mg/dL (ref 0.40–1.20)
GFR: 84.06 mL/min (ref >60.00)
Glucose, Bld: 93 mg/dL (ref 70–99)
Potassium: 4.7 mEq/L (ref 3.5–5.1)
Sodium: 144 mEq/L (ref 135–145)
Total Bilirubin: 0.3 mg/dL (ref 0.2–1.2)
Total Protein: 6.5 g/dL (ref 6.0–8.3)

## 2020-08-06 LAB — LIPID PANEL
Cholesterol: 204 mg/dL — ABNORMAL HIGH (ref 0–200)
HDL: 43 mg/dL (ref >39.00)
LDL Cholesterol: 123 mg/dL — ABNORMAL HIGH (ref 0–99)
NonHDL: 161.46
Total CHOL/HDL Ratio: 5
Triglycerides: 191 mg/dL — ABNORMAL HIGH (ref 0.0–149.0)
VLDL: 38.2 mg/dL (ref 0.0–40.0)

## 2020-08-06 LAB — URIC ACID: Uric Acid, Serum: 5.3 mg/dL (ref 2.4–7.0)

## 2020-08-06 LAB — HEMOGLOBIN A1C: Hgb A1c MFr Bld: 5.7 % (ref 4.6–6.5)

## 2020-08-06 MED ORDER — RALOXIFENE HCL 60 MG PO TABS: 60.0000 mg | ORAL_TABLET | Freq: Every day | ORAL | 3 refills | Status: DC

## 2020-08-06 MED ORDER — MONTELUKAST SODIUM 10 MG PO TABS: 10.0000 mg | ORAL_TABLET | Freq: Every day | ORAL | 3 refills | Status: DC

## 2020-08-06 MED ORDER — TETANUS-DIPHTH-ACELL PERTUSSIS 5-2-15.5 LF-MCG/0.5 IM SUSP: 0.5000 mL | Freq: Once | INTRAMUSCULAR | 0 refills | Status: AC

## 2020-08-06 MED ORDER — ZOSTER VAC RECOMB ADJUVANTED 50 MCG/0.5ML IM SUSR: 0.5000 mL | Freq: Once | INTRAMUSCULAR | 1 refills | Status: AC

## 2020-08-06 NOTE — Assessment & Plan Note (Signed)
Compliant to calcium and vitamin D and Evista 60 mg daily. Repeat Bone density scan due and pending.  Continue current regimen.

## 2020-08-06 NOTE — Assessment & Plan Note (Signed)
Chronic and intermittent to right great toe, also with right lower extremity cramping. No erythema, swelling to toes or joints of foot, no signs of DVT noted to calf.  Checking uric acid level.

## 2020-08-06 NOTE — Assessment & Plan Note (Signed)
Doing well on Symbicort 160-4.5 BID, Dupixent 300 mg every 2 weeks, and Xyzal 5 mg and Singulair 10 mg daily.   Continue same.  Follows with pulmonology.

## 2020-08-06 NOTE — Assessment & Plan Note (Signed)
Rx for Shingrix and Tdap (patient request) provided. Other vaccines UTD.  Mammogram and bone density scan due, ordered and pending.  Colonoscopy N/A given age.  Commended her on regular activity.  Encouraged a healthy diet.   Exam today stable. Labs pending.

## 2020-08-06 NOTE — Progress Notes (Signed)
Subjective:    Patient ID: [name], female    DOB: Dec 26, 1940, 80 y.o.   MRN: [id number]  HPI  [name] is a very pleasant 80 y.o. female who presents today for complete physical and follow up of chronic heath conditions.   She has noticed lower extremity cramping, pain to the right great toe. She's concerned about gout, no prior diagnosis. Denies erythema and swelling. Does have known arthritis to her lower back.   Immunizations: -Tetanus: 2011 -Influenza: Completed last season  -Covid-19: Completed 3 doses -Shingles: Completed Zostavax -Pneumonia: Prevnar in 2015, Pneumovax in 2019   Diet: She endorses a fair diet.  Exercise: She is exercising 5 minutes daily   Eye exam: Completes annually  Dental exam: Completes semi-annually   Mammogram: June 2020 Dexa: June 2020 Colonoscopy: 2016  BP Readings from Last 3 Encounters:  08/06/20 124/62  01/01/20 120/72  10/03/19 124/84         Review of Systems  Constitutional: Negative for unexpected weight change.  HENT: Negative for rhinorrhea.   Respiratory: Negative for cough and shortness of breath.   Cardiovascular: Negative for chest pain.  Gastrointestinal: Negative for constipation and diarrhea.  Genitourinary: Negative for difficulty urinating.  Musculoskeletal: Positive for arthralgias.       Right great toe pain, right lower extremity cramping  Skin: Negative for rash.  Allergic/Immunologic: Negative for environmental allergies.  Neurological: Negative for dizziness and headaches.  Psychiatric/Behavioral: The patient is not nervous/anxious.          Past Medical History:  Diagnosis Date  . Arthritis   . Asthma   . Cancer of breast ([place])    33 years ago, left,   . Hearing loss    both ears, wwears hearing aides  . Hyperlipidemia   . Macular degeneration   . Osteopenia   . Pneumonia     Social History   Socioeconomic History  . Marital status: Married    Spouse name: Not on file  .  Number of children: Not on file  . Years of education: Not on file  . Highest education level: Not on file  Occupational History  . Occupation: retired  Tobacco Use  . Smoking status: Never Smoker  . Smokeless tobacco: Never Used  Vaping Use  . Vaping Use: Never used  Substance and Sexual Activity  . Alcohol use: Not Currently    Comment: occasionally  . Drug use: No  . Sexual activity: Not Currently  Other Topics Concern  . Not on file  Social History Narrative   Married.   2 children, 6 grandchildren.   Retired. Previously was Pharmacist, hospital.    Enjoys participating in church.    Social Determinants of Health   Financial Resource Strain: Low Risk   . Difficulty of Paying Living Expenses: Not hard at all  Food Insecurity: No Food Insecurity  . Worried About Charity fundraiser in the Last Year: Never true  . Ran Out of Food in the Last Year: Never true  Transportation Needs: No Transportation Needs  . Lack of Transportation (Medical): No  . Lack of Transportation (Non-Medical): No  Physical Activity: Insufficiently Active  . Days of Exercise per Week: 7 days  . Minutes of Exercise per Session: 20 min  Stress: No Stress Concern Present  . Feeling of Stress : Not at all  Social Connections: Not on file  Intimate Partner Violence: Not At Risk  . Fear of Current or Ex-Partner: No  .  Emotionally Abused: No  . Physically Abused: No  . Sexually Abused: No    Past Surgical History:  Procedure Laterality Date  . ABDOMINAL HYSTERECTOMY    . BREAST ENHANCEMENT SURGERY    . CESAREAN SECTION     x 2  . COLONOSCOPY WITH PROPOFOL N/A 04/21/2014   Procedure: COLONOSCOPY WITH PROPOFOL;  Surgeon: Garlan Fair, MD;  Location: [facility];  Service: Endoscopy;  Laterality: N/A;  . EYE SURGERY Bilateral march 2015 and june 2015   lens for cataracts  . LEFT HEART CATHETERIZATION WITH CORONARY ANGIOGRAM N/A 01/24/2012   Procedure: LEFT HEART CATHETERIZATION WITH CORONARY ANGIOGRAM;   Surgeon: Sueanne Margarita, MD;  Location: [facility];  Service: Cardiovascular;  Laterality: N/A;  . MASTECTOMY  33 yrs ago   bilateral    Family History  Problem Relation Age of Onset  . Macular degeneration Mother   . Heart attack Father     Allergies  Allergen Reactions  . Fosamax [Alendronate Sodium] Nausea Only  . Sulfonamide Derivatives Rash    Current Outpatient Medications on File Prior to Visit  Medication Sig Dispense Refill  . Cholecalciferol (VITAMIN D) 1000 UNITS capsule Take 1,000 Units by mouth daily with breakfast.     . diclofenac (VOLTAREN) 75 MG EC tablet TAKE 1 TABLET BY MOUTH TWICE A DAY AS NEEDED 60 tablet 0  . DUPIXENT 300 MG/2ML prefilled syringe INJECT 300MG  (1 SYRINGE) SUBCUTANEOUSLY EVERY OTHER WEEK. 12 mL 3  . levocetirizine (XYZAL) 5 MG tablet TAKE 1 TABLET BY MOUTH EVERY DAY IN THE EVENING FOR ALLERGIES. 90 tablet 3  . montelukast (SINGULAIR) 10 MG tablet TAKE 1 TABLET BY MOUTH EVERYDAY AT BEDTIME 90 tablet 1  . Multiple Vitamins-Minerals (MULTI FOR HER 50+) TABS Take 1 tablet by mouth daily.    . Multiple Vitamins-Minerals (PRESERVISION/LUTEIN) CAPS Take 1 capsule by mouth 2 (two) times daily.    . raloxifene (EVISTA) 60 MG tablet TAKE 1 TABLET BY MOUTH EVERY DAY 30 tablet 0  . SYMBICORT 160-4.5 MCG/ACT inhaler INHALE 2 PUFFS INTO THE LUNGS TWO TIMES DAILY 10.2 each 5   No current facility-administered medications on file prior to visit.    BP 124/62   Pulse 88   Temp (!) 96.8 F (36 C) (Temporal)   Ht 4' 11.5" (1.511 m)   Wt 136 lb (61.7 kg)   SpO2 100%   BMI 27.01 kg/m  Objective:   Physical Exam HENT:     Right Ear: Tympanic membrane and ear canal normal.     Left Ear: Tympanic membrane and ear canal normal.     Nose: Nose normal.  Eyes:     Conjunctiva/sclera: Conjunctivae normal.     Pupils: Pupils are equal, round, and reactive to light.  Neck:     Thyroid: No thyromegaly.  Cardiovascular:     Rate and Rhythm: Normal rate and  regular rhythm.     Heart sounds: No murmur heard.   Pulmonary:     Effort: Pulmonary effort is normal.     Breath sounds: Normal breath sounds. No rales.  Abdominal:     General: Bowel sounds are normal.     Palpations: Abdomen is soft.     Tenderness: There is no abdominal tenderness.  Musculoskeletal:        General: Normal range of motion.     Cervical back: Neck supple.     Comments: No swelling noted to joints of right foot.  Lymphadenopathy:  Cervical: No cervical adenopathy.  Skin:    General: Skin is warm and dry.     Findings: No erythema or rash.  Neurological:     Mental Status: She is alert and oriented to person, place, and time.     Cranial Nerves: No cranial nerve deficit.     Deep Tendon Reflexes: Reflexes are normal and symmetric.           Assessment & Plan:      This visit occurred during the SARS-CoV-2 public health emergency.  Safety protocols were in place, including screening questions prior to the visit, additional usage of staff PPE, and extensive cleaning of exam room while observing appropriate contact time as indicated for disinfecting solutions.

## 2020-08-06 NOTE — Assessment & Plan Note (Signed)
Improving with daily walking and activity. Continue to monitor.

## 2020-08-06 NOTE — Assessment & Plan Note (Signed)
Doing well on current regimen of Xyzal 5 mg and Singulair 10 mg , continue same.

## 2020-08-06 NOTE — Patient Instructions (Signed)
Stop by the lab prior to leaving today. I will notify you of your results once received.   Call [facility] to schedule your mammogram and bone density scan.   Take the shingles and tetanus vaccine prescriptions to your pharmacy.   It was a pleasure to see you today!   Preventive Care 80 Years and Older, Female Preventive care refers to lifestyle choices and visits with your health care provider that can promote health and wellness. This includes:  A yearly physical exam. This is also called an annual wellness visit.  Regular dental and eye exams.  Immunizations.  Screening for certain conditions.  Healthy lifestyle choices, such as: ? Eating a healthy diet. ? Getting regular exercise. ? Not using drugs or products that contain nicotine and tobacco. ? Limiting alcohol use. What can I expect for my preventive care visit? Physical exam Your health care provider will check your:  Height and weight. These may be used to calculate your BMI (body mass index). BMI is a measurement that tells if you are at a healthy weight.  Heart rate and blood pressure.  Body temperature.  Skin for abnormal spots. Counseling Your health care provider may ask you questions about your:  Past medical problems.  Family's medical history.  Alcohol, tobacco, and drug use.  Emotional well-being.  Home life and relationship well-being.  Sexual activity.  Diet, exercise, and sleep habits.  History of falls.  Memory and ability to understand (cognition).  Work and work Statistician.  Pregnancy and menstrual history.  Access to firearms. What immunizations do I need? Vaccines are usually given at various ages, according to a schedule. Your health care provider will recommend vaccines for you based on your age, medical history, and lifestyle or other factors, such as travel or where you work.   What tests do I need? Blood tests  Lipid and cholesterol levels. These may be checked  every 5 years, or more often depending on your overall health.  Hepatitis C test.  Hepatitis B test. Screening  Lung cancer screening. You may have this screening every year starting at age 80 if you have a 30-pack-year history of smoking and currently smoke or have quit within the past 15 years.  Colorectal cancer screening. ? All adults should have this screening starting at age 80 and continuing until age 10. ? Your health care provider may recommend screening at age 49 if you are at increased risk. ? You will have tests every 1-10 years, depending on your results and the type of screening test.  Diabetes screening. ? This is done by checking your blood sugar (glucose) after you have not eaten for a while (fasting). ? You may have this done every 1-3 years.  Mammogram. ? This may be done every 1-2 years. ? Talk with your health care provider about how often you should have regular mammograms.  Abdominal aortic aneurysm (AAA) screening. You may need this if you are a current or former smoker.  BRCA-related cancer screening. This may be done if you have a family history of breast, ovarian, tubal, or peritoneal cancers. Other tests  STD (sexually transmitted disease) testing, if you are at risk.  Bone density scan. This is done to screen for osteoporosis. You may have this done starting at age 80. Talk with your health care provider about your test results, treatment options, and if necessary, the need for more tests. Follow these instructions at home: Eating and drinking  Eat a diet that includes  fresh fruits and vegetables, whole grains, lean protein, and low-fat dairy products. Limit your intake of foods with high amounts of sugar, saturated fats, and salt.  Take vitamin and mineral supplements as recommended by your health care provider.  Do not drink alcohol if your health care provider tells you not to drink.  If you drink alcohol: ? Limit how much you have to 0-1 drink  a day. ? Be aware of how much alcohol is in your drink. In the [place] one drink equals one 12 oz bottle of beer (355 mL), one 5 oz glass of wine (148 mL), or one 1 oz glass of hard liquor (44 mL).   Lifestyle  Take daily care of your teeth and gums. Brush your teeth every morning and night with fluoride toothpaste. Floss one time each day.  Stay active. Exercise for at least 30 minutes 5 or more days each week.  Do not use any products that contain nicotine or tobacco, such as cigarettes, e-cigarettes, and chewing tobacco. If you need help quitting, ask your health care provider.  Do not use drugs.  If you are sexually active, practice safe sex. Use a condom or other form of protection in order to prevent STIs (sexually transmitted infections).  Talk with your health care provider about taking a low-dose aspirin or statin.  Find healthy ways to cope with stress, such as: ? Meditation, yoga, or listening to music. ? Journaling. ? Talking to a trusted person. ? Spending time with friends and family. Safety  Always wear your seat belt while driving or riding in a vehicle.  Do not drive: ? If you have been drinking alcohol. Do not ride with someone who has been drinking. ? When you are tired or distracted. ? While texting.  Wear a helmet and other protective equipment during sports activities.  If you have firearms in your house, make sure you follow all gun safety procedures. What's next?  Visit your health care provider once a year for an annual wellness visit.  Ask your health care provider how often you should have your eyes and teeth checked.  Stay up to date on all vaccines. This information is not intended to replace advice given to you by your health care provider. Make sure you discuss any questions you have with your health care provider. Document Revised: 03/04/2020 Document Reviewed: 03/08/2018 Elsevier Patient Education  2021 [employer].

## 2020-08-06 NOTE — Assessment & Plan Note (Signed)
Commended her on regular activity and walking.  Repeat lipid panel pending.

## 2020-08-07 LAB — HEPATITIS C ANTIBODY
Hepatitis C Ab: NONREACTIVE
SIGNAL TO CUT-OFF: 0 (ref <1.00)

## 2020-08-09 ENCOUNTER — Other Ambulatory Visit: Payer: Self-pay | Admitting: Primary Care

## 2020-08-09 DIAGNOSIS — G8929 Other chronic pain: Secondary | ICD-10-CM

## 2020-08-09 DIAGNOSIS — J4540 Moderate persistent asthma, uncomplicated: Secondary | ICD-10-CM

## 2020-08-26 DIAGNOSIS — H353221 Exudative age-related macular degeneration, left eye, with active choroidal neovascularization: Secondary | ICD-10-CM | POA: Diagnosis not present

## 2020-09-07 ENCOUNTER — Telehealth: Payer: Self-pay

## 2020-09-07 NOTE — Telephone Encounter (Signed)
Submitted a Prior Authorization request to [facility] for [place] via CoverMyMeds. Will update once we receive a response.   Key: Jaynie Crumble

## 2020-09-08 ENCOUNTER — Other Ambulatory Visit (HOSPITAL_COMMUNITY): Payer: Self-pay

## 2020-09-08 NOTE — Telephone Encounter (Addendum)
Received notification from [facility] regarding a prior authorization for [place]. Authorization has been APPROVED from 03/28/2020 to 03/27/2021.   Authorization # [id number] Phone # [phone]  Test claim reveals that PA has not yet adjudicated at pharmacy level. Will reattempt at a later time.

## 2020-09-09 ENCOUNTER — Other Ambulatory Visit: Payer: Self-pay | Admitting: Primary Care

## 2020-09-09 ENCOUNTER — Other Ambulatory Visit (HOSPITAL_COMMUNITY): Payer: Self-pay

## 2020-09-09 DIAGNOSIS — M5442 Lumbago with sciatica, left side: Secondary | ICD-10-CM

## 2020-09-09 NOTE — Telephone Encounter (Signed)
Reached out to [employer] and provided PA Authorization number, dates, and phone number.

## 2020-09-10 ENCOUNTER — Other Ambulatory Visit (HOSPITAL_COMMUNITY): Payer: Self-pay

## 2020-10-07 DIAGNOSIS — H353221 Exudative age-related macular degeneration, left eye, with active choroidal neovascularization: Secondary | ICD-10-CM | POA: Diagnosis not present

## 2020-10-09 ENCOUNTER — Telehealth: Payer: Self-pay | Admitting: *Deleted

## 2020-10-09 MED ORDER — ALBUTEROL SULFATE HFA 108 (90 BASE) MCG/ACT IN AERS: 2.0000 | INHALATION_SPRAY | Freq: Four times a day (QID) | RESPIRATORY_TRACT | 2 refills | Status: DC | PRN

## 2020-10-09 NOTE — Telephone Encounter (Signed)
[name] left voicemail on triage line stating she has asthma and woke up last night with trouble breathing.  She states she has a ventolin rescue inhaler but it is 3 years past expiration date.  She is requesting refill.  I spoke with Ms. Hansman.  She states she is not in any distress.  She had Covid 3 to 4 weeks ago and feels this could be a lingering effect from that.  Discussed with Dr. Diona Browner.  Ok to send in refill.  Patient notified that refill has been sent.  ER precautions given.  Patient states understanding.

## 2020-11-02 DIAGNOSIS — L820 Inflamed seborrheic keratosis: Secondary | ICD-10-CM | POA: Diagnosis not present

## 2020-11-02 DIAGNOSIS — Z08 Encounter for follow-up examination after completed treatment for malignant neoplasm: Secondary | ICD-10-CM | POA: Diagnosis not present

## 2020-11-02 DIAGNOSIS — Z85828 Personal history of other malignant neoplasm of skin: Secondary | ICD-10-CM | POA: Diagnosis not present

## 2020-11-09 ENCOUNTER — Other Ambulatory Visit: Payer: Self-pay | Admitting: Primary Care

## 2020-11-09 DIAGNOSIS — G8929 Other chronic pain: Secondary | ICD-10-CM

## 2020-11-25 DIAGNOSIS — H353221 Exudative age-related macular degeneration, left eye, with active choroidal neovascularization: Secondary | ICD-10-CM | POA: Diagnosis not present

## 2021-01-13 DIAGNOSIS — H353221 Exudative age-related macular degeneration, left eye, with active choroidal neovascularization: Secondary | ICD-10-CM | POA: Diagnosis not present

## 2021-02-06 ENCOUNTER — Other Ambulatory Visit: Payer: Self-pay | Admitting: Primary Care

## 2021-02-06 DIAGNOSIS — G8929 Other chronic pain: Secondary | ICD-10-CM

## 2021-02-09 ENCOUNTER — Ambulatory Visit
Admission: RE | Admit: 2021-02-09 | Discharge: 2021-02-09 | Disposition: A | Discharge status: Home / Self Care | Payer: Medicare HMO | Source: Ambulatory Visit | Attending: Primary Care | Admitting: Primary Care

## 2021-02-09 ENCOUNTER — Other Ambulatory Visit: Payer: Self-pay

## 2021-02-09 DIAGNOSIS — M8589 Other specified disorders of bone density and structure, multiple sites: Secondary | ICD-10-CM | POA: Diagnosis not present

## 2021-02-09 DIAGNOSIS — M8580 Other specified disorders of bone density and structure, unspecified site: Secondary | ICD-10-CM

## 2021-02-13 ENCOUNTER — Other Ambulatory Visit: Payer: Self-pay | Admitting: Primary Care

## 2021-02-13 DIAGNOSIS — J4540 Moderate persistent asthma, uncomplicated: Secondary | ICD-10-CM

## 2021-03-03 ENCOUNTER — Telehealth: Payer: Self-pay

## 2021-03-03 DIAGNOSIS — H353221 Exudative age-related macular degeneration, left eye, with active choroidal neovascularization: Secondary | ICD-10-CM | POA: Diagnosis not present

## 2021-03-03 NOTE — Telephone Encounter (Signed)
Received a fax from  [place] regarding an approval for [place] patient assistance through 03/27/2022. Approval letter sent to scan center.

## 2021-04-21 DIAGNOSIS — H353221 Exudative age-related macular degeneration, left eye, with active choroidal neovascularization: Secondary | ICD-10-CM | POA: Diagnosis not present

## 2021-04-30 DIAGNOSIS — M25512 Pain in left shoulder: Secondary | ICD-10-CM | POA: Diagnosis not present

## 2021-04-30 DIAGNOSIS — Z23 Encounter for immunization: Secondary | ICD-10-CM | POA: Diagnosis not present

## 2021-04-30 DIAGNOSIS — I6782 Cerebral ischemia: Secondary | ICD-10-CM | POA: Diagnosis not present

## 2021-04-30 DIAGNOSIS — S0990XA Unspecified injury of head, initial encounter: Secondary | ICD-10-CM | POA: Diagnosis not present

## 2021-04-30 DIAGNOSIS — J45909 Unspecified asthma, uncomplicated: Secondary | ICD-10-CM | POA: Diagnosis not present

## 2021-04-30 DIAGNOSIS — I10 Essential (primary) hypertension: Secondary | ICD-10-CM | POA: Diagnosis not present

## 2021-04-30 DIAGNOSIS — R58 Hemorrhage, not elsewhere classified: Secondary | ICD-10-CM | POA: Diagnosis not present

## 2021-04-30 DIAGNOSIS — J329 Chronic sinusitis, unspecified: Secondary | ICD-10-CM | POA: Diagnosis not present

## 2021-04-30 DIAGNOSIS — W19XXXA Unspecified fall, initial encounter: Secondary | ICD-10-CM | POA: Diagnosis not present

## 2021-04-30 DIAGNOSIS — R220 Localized swelling, mass and lump, head: Secondary | ICD-10-CM | POA: Diagnosis not present

## 2021-04-30 DIAGNOSIS — S0101XA Laceration without foreign body of scalp, initial encounter: Secondary | ICD-10-CM | POA: Diagnosis not present

## 2021-04-30 DIAGNOSIS — M85812 Other specified disorders of bone density and structure, left shoulder: Secondary | ICD-10-CM | POA: Diagnosis not present

## 2021-04-30 DIAGNOSIS — R000 Tachycardia, unspecified: Secondary | ICD-10-CM | POA: Diagnosis not present

## 2021-05-07 ENCOUNTER — Other Ambulatory Visit: Payer: Self-pay

## 2021-05-07 ENCOUNTER — Encounter: Payer: Self-pay | Admitting: Primary Care

## 2021-05-07 ENCOUNTER — Ambulatory Visit (INDEPENDENT_AMBULATORY_CARE_PROVIDER_SITE_OTHER): Payer: Medicare HMO | Admitting: Primary Care

## 2021-05-07 DIAGNOSIS — S0191XA Laceration without foreign body of unspecified part of head, initial encounter: Secondary | ICD-10-CM

## 2021-05-07 DIAGNOSIS — R29898 Other symptoms and signs involving the musculoskeletal system: Secondary | ICD-10-CM | POA: Diagnosis not present

## 2021-05-07 DIAGNOSIS — S0181XS Laceration without foreign body of other part of head, sequela: Secondary | ICD-10-CM

## 2021-05-07 HISTORY — DX: Laceration without foreign body of unspecified part of head, initial encounter: S01.91XA

## 2021-05-07 NOTE — Assessment & Plan Note (Signed)
Reviewed ED notes from [place] through [facility].  4 sutures removed from left frontal lobe. No complications.  Patient tolerated well. Discussed home care instructions.

## 2021-05-07 NOTE — Patient Instructions (Signed)
You will be contacted regarding your referral to physical therapy.  Please let us know if you have not been contacted within two weeks.   Keep the site clean and dry.  It was a pleasure to see you today!

## 2021-05-07 NOTE — Assessment & Plan Note (Signed)
Chronic for years.  Offered outpatient PT for strength and balance, she agrees.  Referral placed for PT.

## 2021-05-07 NOTE — Progress Notes (Signed)
Subjective:    Patient ID: [name], female    DOB: 1941-02-02, 81 y.o.   MRN: [id number]  HPI  [name] is a very pleasant 81 y.o. female who presents today for ED follow up and suture removal.  She presented to [facility] ED in [place] on 04/30/21 via EMS for a fall and head laceration. She was walking on the sidewalk, slipped and hit her left frontal head on the corner of the brick sidewalk. She had no loss of consciousness.   During her visit her tetanus vaccine was updated. Xray of the shoulder was without acute fracture. Her CT head was negative for acute process. Her laceration was closed with 4 sutures to the left frontal head. She was discharged home with a prescription for amoxicillin 500 mg TID for a 7 day course.  Since her ED visit she's doing well. She denies headaches, dizziness, falls.   She endorses that her legs "never recovered" from her pneumonia and sepsis years ago. She will occasionally drag her left leg and her right leg will catch on door jams.   She denies knee pain, paresthesias to lower extremities. Her chronic back pain is well controlled with home PT exercises. She's never completed PT for her lower extremity weakness.   Review of Systems  Eyes:  Negative for visual disturbance.  Respiratory:  Negative for shortness of breath.   Cardiovascular:  Negative for chest pain.  Neurological:  Positive for weakness. Negative for numbness and headaches.        Past Medical History:  Diagnosis Date   Arthritis    Asthma    Cancer of breast ([place])    33 years ago, left,    Hearing loss    both ears, wwears hearing aides   Hyperlipidemia    Macular degeneration    Osteopenia    Pneumonia     Social History   Socioeconomic History   Marital status: Married    Spouse name: Not on file   Number of children: Not on file   Years of education: Not on file   Highest education level: Not on file  Occupational History   Occupation: retired   Tobacco Use   Smoking status: Never   Smokeless tobacco: Never  Vaping Use   Vaping Use: Never used  Substance and Sexual Activity   Alcohol use: Not Currently    Comment: occasionally   Drug use: No   Sexual activity: Not Currently  Other Topics Concern   Not on file  Social History Narrative   Married.   2 children, 6 grandchildren.   Retired. Previously was Pharmacist, hospital.    Enjoys participating in church.    Social Determinants of Health   Financial Resource Strain: Low Risk    Difficulty of Paying Living Expenses: Not hard at all  Food Insecurity: No Food Insecurity   Worried About Charity fundraiser in the Last Year: Never true   [place] in the Last Year: Never true  Transportation Needs: No Transportation Needs   Lack of Transportation (Medical): No   Lack of Transportation (Non-Medical): No  Physical Activity: Insufficiently Active   Days of Exercise per Week: 7 days   Minutes of Exercise per Session: 20 min  Stress: No Stress Concern Present   Feeling of Stress : Not at all  Social Connections: Not on file  Intimate Partner Violence: Not At Risk   Fear of Current or Ex-Partner: No  Emotionally Abused: No   Physically Abused: No   Sexually Abused: No    Past Surgical History:  Procedure Laterality Date   ABDOMINAL HYSTERECTOMY     BREAST ENHANCEMENT SURGERY     CESAREAN SECTION     x 2   COLONOSCOPY WITH PROPOFOL N/A 04/21/2014   Procedure: COLONOSCOPY WITH PROPOFOL;  Surgeon: Garlan Fair, MD;  Location: [facility];  Service: Endoscopy;  Laterality: N/A;   EYE SURGERY Bilateral march 2015 and june 2015   lens for cataracts   LEFT HEART CATHETERIZATION WITH CORONARY ANGIOGRAM N/A 01/24/2012   Procedure: LEFT HEART CATHETERIZATION WITH CORONARY ANGIOGRAM;  Surgeon: Sueanne Margarita, MD;  Location: [facility];  Service: Cardiovascular;  Laterality: N/A;   MASTECTOMY  33 yrs ago   bilateral    Family History  Problem Relation Age of Onset    Macular degeneration Mother    Heart attack Father     Allergies  Allergen Reactions   Fosamax [Alendronate Sodium] Nausea Only   Sulfonamide Derivatives Rash    Current Outpatient Medications on File Prior to Visit  Medication Sig Dispense Refill   albuterol (VENTOLIN HFA) 108 (90 Base) MCG/ACT inhaler Inhale 2 puffs into the lungs every 6 (six) hours as needed for wheezing or shortness of breath. 18 g 2   Cholecalciferol (VITAMIN D) 1000 UNITS capsule Take 1,000 Units by mouth daily with breakfast.      diclofenac (VOLTAREN) 75 MG EC tablet TAKE 1 TABLET BY MOUTH TWICE A DAY AS NEEDED FOR BACK PAIN 180 tablet 0   DUPIXENT 300 MG/2ML prefilled syringe INJECT 300MG  (1 SYRINGE) SUBCUTANEOUSLY EVERY OTHER WEEK. 12 mL 3   levocetirizine (XYZAL) 5 MG tablet TAKE 1 TABLET BY MOUTH EVERY DAY IN THE EVENING FOR ALLERGIES. 90 tablet 3   montelukast (SINGULAIR) 10 MG tablet Take 1 tablet (10 mg total) by mouth at bedtime. For allergies and asthma 90 tablet 3   Multiple Vitamins-Minerals (MULTI FOR HER 50+) TABS Take 1 tablet by mouth daily.     Multiple Vitamins-Minerals (PRESERVISION/LUTEIN) CAPS Take 1 capsule by mouth 2 (two) times daily.     raloxifene (EVISTA) 60 MG tablet Take 1 tablet (60 mg total) by mouth daily. For bone health. 90 tablet 3   SYMBICORT 160-4.5 MCG/ACT inhaler INHALE 2 PUFFS INTO THE LUNGS TWO TIMES DAILY 10.2 each 5   amoxicillin (AMOXIL) 500 MG capsule Take 500 mg by mouth 3 (three) times daily.     No current facility-administered medications on file prior to visit.    BP 120/62    Pulse (!) 106    Temp 97.7 F (36.5 C) (Temporal)    Ht 4' 11.5" (1.511 m)    Wt 139 lb (63 kg)    SpO2 98%    BMI 27.60 kg/m  Objective:   Physical Exam Pulmonary:     Effort: Pulmonary effort is normal.  Musculoskeletal:     Comments: Ambulates well in clinic without assistive device. Able to get up and down from exam table without assistance.   Skin:    General: Skin is warm and  dry.     Findings: No erythema.     Comments: 1 cm linear, closed laceration with dark scabbing to left frontal head. 4 sutures intact.  Neurological:     Mental Status: She is alert and oriented to person, place, and time.          Assessment & Plan:      This  visit occurred during the SARS-CoV-2 public health emergency.  Safety protocols were in place, including screening questions prior to the visit, additional usage of staff PPE, and extensive cleaning of exam room while observing appropriate contact time as indicated for disinfecting solutions.

## 2021-05-10 ENCOUNTER — Telehealth: Payer: Self-pay | Admitting: Primary Care

## 2021-05-10 DIAGNOSIS — G8929 Other chronic pain: Secondary | ICD-10-CM

## 2021-05-10 DIAGNOSIS — M5442 Lumbago with sciatica, left side: Secondary | ICD-10-CM

## 2021-05-10 NOTE — Telephone Encounter (Signed)
Due for cpe after 08/06/21

## 2021-05-11 ENCOUNTER — Other Ambulatory Visit: Payer: Self-pay

## 2021-05-11 ENCOUNTER — Encounter: Payer: Self-pay | Admitting: Family

## 2021-05-11 ENCOUNTER — Telehealth (INDEPENDENT_AMBULATORY_CARE_PROVIDER_SITE_OTHER): Payer: Medicare HMO | Admitting: Family

## 2021-05-11 VITALS — Ht 60.0 in | Wt 134.0 lb

## 2021-05-11 DIAGNOSIS — U071 COVID-19: Secondary | ICD-10-CM | POA: Diagnosis not present

## 2021-05-11 DIAGNOSIS — R051 Acute cough: Secondary | ICD-10-CM

## 2021-05-11 HISTORY — DX: COVID-19: U07.1

## 2021-05-11 MED ORDER — MOLNUPIRAVIR EUA 200MG CAPSULE: 4.0000 | ORAL_CAPSULE | Freq: Two times a day (BID) | ORAL | 0 refills | Status: DC

## 2021-05-11 MED ORDER — NIRMATRELVIR/RITONAVIR (PAXLOVID)TABLET
3.0000 | ORAL_TABLET | Freq: Two times a day (BID) | ORAL | 0 refills | Status: AC
Start: 2021-05-11 — End: 2021-05-16

## 2021-05-11 NOTE — Assessment & Plan Note (Signed)
Continue with conservative measures.

## 2021-05-11 NOTE — Telephone Encounter (Signed)
Please schedule patient for CPE in May but after 08/06/21.

## 2021-05-11 NOTE — Telephone Encounter (Signed)
Called Mrs. Mauritz and got her scheduled for 5/16 @11 

## 2021-05-11 NOTE — Progress Notes (Signed)
MyChart Video Visit    Virtual Visit via Video Note   This visit type was conducted due to national recommendations for restrictions regarding the COVID-19 Pandemic (e.g. social distancing) in an effort to limit this patient's exposure and mitigate transmission in our community. This patient is at least at moderate risk for complications without adequate follow up. This format is felt to be most appropriate for this patient at this time. Physical exam was limited by quality of the video and audio technology used for the visit. CMA was able to get the patient set up on a video visit.  Patient location: Home. Patient and provider in visit Provider location: Office  I discussed the limitations of evaluation and management by telemedicine and the availability of in person appointments. The patient expressed understanding and agreed to proceed.  Visit Date: 05/11/2021  Today's healthcare provider: Eugenia Pancoast, FNP     Subjective:    Patient ID: [name], female    DOB: 1940/11/20, 81 y.o.   MRN: [id number]  Chief Complaint  Patient presents with   Covid Positive    Sore -throat, headache, coughing, clear mucus, fever 99.5 degree, muscle ache--3 days    HPI  Pt here with c/o three day h/o muscle and body aches, sore throat, headache coughing and clear mucous coming up with cough. She has had a recent 'fever' of 99.5 F. She does have some slight chest congestion, no wheezing no shortness of breath.she did take tylenol last night for slight elevation of temperature but now feeling much better, and no tylenol today.   She tested positive for covid 05/10/21.  Past Medical History:  Diagnosis Date   Arthritis    Asthma    Cancer of breast ([place])    33 years ago, left,    Hearing loss    both ears, wwears hearing aides   Hyperlipidemia    Macular degeneration    Osteopenia    Pneumonia     Past Surgical History:  Procedure Laterality Date   ABDOMINAL HYSTERECTOMY      BREAST ENHANCEMENT SURGERY     CESAREAN SECTION     x 2   COLONOSCOPY WITH PROPOFOL N/A 04/21/2014   Procedure: COLONOSCOPY WITH PROPOFOL;  Surgeon: Garlan Fair, MD;  Location: [facility];  Service: Endoscopy;  Laterality: N/A;   EYE SURGERY Bilateral march 2015 and june 2015   lens for cataracts   LEFT HEART CATHETERIZATION WITH CORONARY ANGIOGRAM N/A 01/24/2012   Procedure: LEFT HEART CATHETERIZATION WITH CORONARY ANGIOGRAM;  Surgeon: Sueanne Margarita, MD;  Location: [facility];  Service: Cardiovascular;  Laterality: N/A;   MASTECTOMY  33 yrs ago   bilateral    Family History  Problem Relation Age of Onset   Macular degeneration Mother    Heart attack Father     Social History   Socioeconomic History   Marital status: Married    Spouse name: Not on file   Number of children: Not on file   Years of education: Not on file   Highest education level: Not on file  Occupational History   Occupation: retired  Tobacco Use   Smoking status: Never   Smokeless tobacco: Never  Vaping Use   Vaping Use: Never used  Substance and Sexual Activity   Alcohol use: Not Currently    Comment: occasionally   Drug use: No   Sexual activity: Not Currently  Other Topics Concern   Not on file  Social History  Narrative   Married.   2 children, 6 grandchildren.   Retired. Previously was Pharmacist, hospital.    Enjoys participating in church.    Social Determinants of Health   Financial Resource Strain: Low Risk    Difficulty of Paying Living Expenses: Not hard at all  Food Insecurity: No Food Insecurity   Worried About Charity fundraiser in the Last Year: Never true   [place] in the Last Year: Never true  Transportation Needs: No Transportation Needs   Lack of Transportation (Medical): No   Lack of Transportation (Non-Medical): No  Physical Activity: Insufficiently Active   Days of Exercise per Week: 7 days   Minutes of Exercise per Session: 20 min  Stress: No Stress Concern  Present   Feeling of Stress : Not at all  Social Connections: Not on file  Intimate Partner Violence: Not At Risk   Fear of Current or Ex-Partner: No   Emotionally Abused: No   Physically Abused: No   Sexually Abused: No    Outpatient Medications Prior to Visit  Medication Sig Dispense Refill   albuterol (VENTOLIN HFA) 108 (90 Base) MCG/ACT inhaler Inhale 2 puffs into the lungs every 6 (six) hours as needed for wheezing or shortness of breath. 18 g 2   Cholecalciferol (VITAMIN D) 1000 UNITS capsule Take 1,000 Units by mouth daily with breakfast.      diclofenac (VOLTAREN) 75 MG EC tablet TAKE 1 TABLET BY MOUTH TWICE A DAY AS NEEDED FOR BACK PAIN 180 tablet 0   DUPIXENT 300 MG/2ML prefilled syringe INJECT 300MG  (1 SYRINGE) SUBCUTANEOUSLY EVERY OTHER WEEK. 12 mL 3   levocetirizine (XYZAL) 5 MG tablet TAKE 1 TABLET BY MOUTH EVERY DAY IN THE EVENING FOR ALLERGIES. 90 tablet 3   montelukast (SINGULAIR) 10 MG tablet Take 1 tablet (10 mg total) by mouth at bedtime. For allergies and asthma 90 tablet 3   Multiple Vitamins-Minerals (MULTI FOR HER 50+) TABS Take 1 tablet by mouth daily.     Multiple Vitamins-Minerals (PRESERVISION/LUTEIN) CAPS Take 1 capsule by mouth 2 (two) times daily.     raloxifene (EVISTA) 60 MG tablet Take 1 tablet (60 mg total) by mouth daily. For bone health. 90 tablet 3   SYMBICORT 160-4.5 MCG/ACT inhaler INHALE 2 PUFFS INTO THE LUNGS TWO TIMES DAILY 10.2 each 5   amoxicillin (AMOXIL) 500 MG capsule Take 500 mg by mouth 3 (three) times daily.     No facility-administered medications prior to visit.    Allergies  Allergen Reactions   Fosamax [Alendronate Sodium] Nausea Only   Sulfonamide Derivatives Rash    Review of Systems  Constitutional:  Positive for fever. Negative for chills. Unexpected weight change: 99.5. HENT:  Positive for congestion and sore throat. Negative for ear pain and sinus pressure.   Respiratory:  Positive for cough (clear sputum) and chest  tightness (mild). Negative for shortness of breath and wheezing.   Cardiovascular:  Negative for chest pain and palpitations.      Objective:    Physical Exam Vitals reviewed.  Constitutional:      Appearance: Normal appearance. She is normal weight.  Pulmonary:     Effort: Pulmonary effort is normal.  Neurological:     General: No focal deficit present.     Mental Status: She is alert and oriented to person, place, and time.  Psychiatric:        Mood and Affect: Mood normal.        Behavior: Behavior normal.  Ht 5' (1.524 m)    Wt 134 lb (60.8 kg)    BMI 26.17 kg/m  Wt Readings from Last 3 Encounters:  05/11/21 134 lb (60.8 kg)  05/07/21 139 lb (63 kg)  08/06/20 136 lb (61.7 kg)       Assessment & Plan:   Problem List Items Addressed This Visit       Other   Acute cough    Continue with conservative measures.       COVID-19 - Primary    Pt is considered high risk for covid complications, so will send antiviral medication.   Advised of CDC guidelines for self isolation/ ending isolation.  Advised of safe practice guidelines. Symptom Tier reviewed.  Encouraged to monitor for any worsening symptoms; watch for increased shortness of breath, weakness, and signs of dehydration. Advised when to seek emergency care.  Instructed to rest and hydrate well.  Advised to leave the house during recommended isolation period, only if it is necessary to seek medical care       Relevant Medications   nirmatrelvir/ritonavir EUA (PAXLOVID) 20 x 150 MG & 10 x 100MG  TABS    I have discontinued [name]'s amoxicillin and molnupiravir EUA. I am also having her start on nirmatrelvir/ritonavir EUA. Additionally, I am having her maintain her Vitamin D, PreserVision/Lutein, Multi For Her 50+, Dupixent, levocetirizine, montelukast, raloxifene, albuterol, Symbicort, and diclofenac.  Meds ordered this encounter  Medications   DISCONTD: molnupiravir EUA (LAGEVRIO) 200 mg CAPS capsule     Sig: Take 4 capsules (800 mg total) by mouth 2 (two) times daily for 5 days.    Dispense:  40 capsule    Refill:  0    Order Specific Question:   Supervising Provider    Answer:   BEDSOLE, AMY E [[id number]]   nirmatrelvir/ritonavir EUA (PAXLOVID) 20 x 150 MG & 10 x 100MG  TABS    Sig: Take 3 tablets by mouth 2 (two) times daily for 5 days. (Take nirmatrelvir 150 mg two tablets twice daily for 5 days and ritonavir 100 mg one tablet twice daily for 5 days) Patient GFR is 85.29    Dispense:  30 tablet    Refill:  0    Order Specific Question:   Supervising Provider    Answer:   BEDSOLE, AMY E [[id number]]    I discussed the assessment and treatment plan with the patient. The patient was provided an opportunity to ask questions and all were answered. The patient agreed with the plan and demonstrated an understanding of the instructions.   The patient was advised to call back or seek an in-person evaluation if the symptoms worsen or if the condition fails to improve as anticipated.  I provided 15 minutes of face-to-face time during this encounter.   Eugenia Pancoast, [place] [facility] [place] [phone] (phone) [phone] (fax)  [place]

## 2021-05-11 NOTE — Assessment & Plan Note (Signed)
Pt is considered high risk for covid complications, so will send antiviral medication.   Advised of CDC guidelines for self isolation/ ending isolation.  Advised of safe practice guidelines. Symptom Tier reviewed.  Encouraged to monitor for any worsening symptoms; watch for increased shortness of breath, weakness, and signs of dehydration. Advised when to seek emergency care.  Instructed to rest and hydrate well.  Advised to leave the house during recommended isolation period, only if it is necessary to seek medical care

## 2021-05-11 NOTE — Patient Instructions (Signed)
I have sent an antiviral medication to your pharmacy for you to get started on.   Your COVID19 PCR test is POSITIVE.  Let us know right away if any worsening shortness of breath or persistent productive cough or fever.   Follow these current CDC guidelines for self-isolation:  - Stay home for 5 days, starting the day after your symptoms (The first day is really day 0). - If you have no symptoms or your symptoms are resolving after 5 days, you can leave your house. - Continue to wear a mask around others for 5 additional days. **If you have a fever, continue to stay home until your fever resolves for 24 hours without fever-reducing medications.**

## 2021-05-12 ENCOUNTER — Telehealth: Payer: Self-pay | Admitting: Primary Care

## 2021-05-12 DIAGNOSIS — U071 COVID-19: Secondary | ICD-10-CM

## 2021-05-12 NOTE — Telephone Encounter (Signed)
[facility] called re:  nirmatrelvir/ritonavir EUA (PAXLOVID) 20 x 150 MG & 10 x 100MG  TABS  [facility]  [phone] called  Stated two medications called in for covid, please confirm which medication the patient needs

## 2021-05-17 ENCOUNTER — Telehealth: Payer: Self-pay | Admitting: Primary Care

## 2021-05-17 NOTE — Telephone Encounter (Signed)
Referral has been sent to [facility] in [place] to [id number]    [id number]  Virl Cagey, CMA on 05/17/2021 1255 - delivered at 05/17/[address], [place] Located in [place] at [place], near [employer] and [place]. Phone: [phone]

## 2021-05-17 NOTE — Telephone Encounter (Signed)
Jake with [facility] in [place] called to let us know that this patient is out of network with them based on insurance

## 2021-05-19 DIAGNOSIS — M6281 Muscle weakness (generalized): Secondary | ICD-10-CM | POA: Diagnosis not present

## 2021-05-19 DIAGNOSIS — R262 Difficulty in walking, not elsewhere classified: Secondary | ICD-10-CM | POA: Diagnosis not present

## 2021-05-19 DIAGNOSIS — R5381 Other malaise: Secondary | ICD-10-CM | POA: Diagnosis not present

## 2021-05-19 DIAGNOSIS — M2569 Stiffness of other specified joint, not elsewhere classified: Secondary | ICD-10-CM | POA: Diagnosis not present

## 2021-05-27 DIAGNOSIS — R262 Difficulty in walking, not elsewhere classified: Secondary | ICD-10-CM | POA: Diagnosis not present

## 2021-05-27 DIAGNOSIS — M6281 Muscle weakness (generalized): Secondary | ICD-10-CM | POA: Diagnosis not present

## 2021-05-27 DIAGNOSIS — M2569 Stiffness of other specified joint, not elsewhere classified: Secondary | ICD-10-CM | POA: Diagnosis not present

## 2021-05-27 DIAGNOSIS — R5381 Other malaise: Secondary | ICD-10-CM | POA: Diagnosis not present

## 2021-05-31 DIAGNOSIS — R262 Difficulty in walking, not elsewhere classified: Secondary | ICD-10-CM | POA: Diagnosis not present

## 2021-05-31 DIAGNOSIS — M2569 Stiffness of other specified joint, not elsewhere classified: Secondary | ICD-10-CM | POA: Diagnosis not present

## 2021-05-31 DIAGNOSIS — R5381 Other malaise: Secondary | ICD-10-CM | POA: Diagnosis not present

## 2021-05-31 DIAGNOSIS — M6281 Muscle weakness (generalized): Secondary | ICD-10-CM | POA: Diagnosis not present

## 2021-06-02 DIAGNOSIS — H353221 Exudative age-related macular degeneration, left eye, with active choroidal neovascularization: Secondary | ICD-10-CM | POA: Diagnosis not present

## 2021-06-04 DIAGNOSIS — M2569 Stiffness of other specified joint, not elsewhere classified: Secondary | ICD-10-CM | POA: Diagnosis not present

## 2021-06-04 DIAGNOSIS — M6281 Muscle weakness (generalized): Secondary | ICD-10-CM | POA: Diagnosis not present

## 2021-06-04 DIAGNOSIS — R5381 Other malaise: Secondary | ICD-10-CM | POA: Diagnosis not present

## 2021-06-04 DIAGNOSIS — R262 Difficulty in walking, not elsewhere classified: Secondary | ICD-10-CM | POA: Diagnosis not present

## 2021-06-07 ENCOUNTER — Telehealth: Payer: Self-pay | Admitting: Pharmacist

## 2021-06-07 DIAGNOSIS — J8283 Eosinophilic asthma: Secondary | ICD-10-CM

## 2021-06-07 DIAGNOSIS — M6281 Muscle weakness (generalized): Secondary | ICD-10-CM | POA: Diagnosis not present

## 2021-06-07 DIAGNOSIS — R5381 Other malaise: Secondary | ICD-10-CM | POA: Diagnosis not present

## 2021-06-07 DIAGNOSIS — R262 Difficulty in walking, not elsewhere classified: Secondary | ICD-10-CM | POA: Diagnosis not present

## 2021-06-07 DIAGNOSIS — M2569 Stiffness of other specified joint, not elsewhere classified: Secondary | ICD-10-CM | POA: Diagnosis not present

## 2021-06-07 NOTE — Telephone Encounter (Signed)
Received refill request fax from [facility] for patient's [name]. Patient has not been seen in clinic since 01/01/20. Will need to be seen by provider before providing any further refills ? ?Knox Saliva, PharmD, MPH, BCPS ?Clinical Pharmacist (Rheumatology and Pulmonology) ? ?

## 2021-06-08 NOTE — Telephone Encounter (Signed)
Patient is scheduled with Dr. Halford Chessman on 06/16/2021 at 3:30pm.  ?

## 2021-06-09 DIAGNOSIS — M2569 Stiffness of other specified joint, not elsewhere classified: Secondary | ICD-10-CM | POA: Diagnosis not present

## 2021-06-09 DIAGNOSIS — R262 Difficulty in walking, not elsewhere classified: Secondary | ICD-10-CM | POA: Diagnosis not present

## 2021-06-09 DIAGNOSIS — M6281 Muscle weakness (generalized): Secondary | ICD-10-CM | POA: Diagnosis not present

## 2021-06-09 DIAGNOSIS — R5381 Other malaise: Secondary | ICD-10-CM | POA: Diagnosis not present

## 2021-06-16 ENCOUNTER — Ambulatory Visit: Payer: Medicare HMO | Admitting: Pulmonary Disease

## 2021-06-16 ENCOUNTER — Other Ambulatory Visit (HOSPITAL_COMMUNITY): Payer: Self-pay

## 2021-06-16 ENCOUNTER — Encounter: Payer: Self-pay | Admitting: Pulmonary Disease

## 2021-06-16 ENCOUNTER — Other Ambulatory Visit: Payer: Self-pay

## 2021-06-16 VITALS — BP 140/74 | HR 99 | Temp 98.4°F | Ht 59.5 in | Wt 138.6 lb

## 2021-06-16 DIAGNOSIS — J309 Allergic rhinitis, unspecified: Secondary | ICD-10-CM

## 2021-06-16 DIAGNOSIS — J8283 Eosinophilic asthma: Secondary | ICD-10-CM | POA: Diagnosis not present

## 2021-06-16 DIAGNOSIS — J479 Bronchiectasis, uncomplicated: Secondary | ICD-10-CM

## 2021-06-16 DIAGNOSIS — J4540 Moderate persistent asthma, uncomplicated: Secondary | ICD-10-CM | POA: Diagnosis not present

## 2021-06-16 MED ORDER — OMEPRAZOLE 20 MG PO CPDR: 20.0000 mg | DELAYED_RELEASE_CAPSULE | Freq: Every day | ORAL | 11 refills | Status: DC

## 2021-06-16 MED ORDER — DUPIXENT 300 MG/2ML ~~LOC~~ SOSY: PREFILLED_SYRINGE | SUBCUTANEOUS | 3 refills | Status: DC

## 2021-06-16 MED ORDER — DUPIXENT 300 MG/2ML ~~LOC~~ SOSY: PREFILLED_SYRINGE | SUBCUTANEOUS | 1 refills | Status: DC

## 2021-06-16 NOTE — Patient Instructions (Signed)
Try using prilosec for two weeks to see if this helps with reflux and reduces your cough ? ?Sip water when you have the urge to cough ? ?Salt water gargle twice per day until cough is better ? ?Avoid forcing a cough or clearing your throat since these can cause more throat irritation and cough ? ?1 teaspoon of local honey as needed to help reduce cough ? ?Call next month to get new script for symbicort at lower dose ? ?Follow up in 1 year ?

## 2021-06-16 NOTE — Addendum Note (Signed)
Addended by: [name] on: 06/16/2021 04:07 PM ? ? Modules accepted: Orders ? ?

## 2021-06-16 NOTE — Progress Notes (Signed)
? ?[facility] ? ?Chief Complaint  ?Patient presents with  ? Follow-up  ?  Needs a refill of dupixent.  Has not been here in 2 years.  Had covid a month ago.  Cannot get rid of congestion.  Has som sob all the time.  ? ? ?Constitutional:  ?BP 140/74 (BP Location: Left Arm, Patient Position: Sitting, Cuff Size: Normal)   Pulse 99   Temp 98.4 ?F (36.9 ?C) (Oral)   Ht 4' 11.5" (1.511 m)   Wt 138 lb 9.6 oz (62.9 kg)   SpO2 97%   BMI 27.53 kg/m?  ? ?Past Medical History:  ?Breast cancer, HLD, Macular degeneration, Osteopenia ? ?Past Surgical History:  ?Her  has a past surgical history that includes Abdominal hysterectomy; Cesarean section; Breast enhancement surgery; left heart catheterization with coronary angiogram (N/A, 01/24/2012); Mastectomy (33 yrs ago); Eye surgery (Bilateral, march 2015 and june 2015); and Colonoscopy with propofol (N/A, 04/21/2014). ? ?Brief Summary:  ?[name] is a 81 y.o. 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  She had pneumonia with empyema in June 2016. ?  ? ? ? ?Subjective:  ? ?I last saw her in 2021.  ? ?She has done well since starting dupixent. ? ?She had COVID last month and has more cough since then.  She has noticed more reflux symptoms.  She uses voltaren for back pain.  She uses tums as needed. ? ?Physical Exam:  ? ?Appearance - well kempt  ? ?ENMT - no sinus tenderness, no oral exudate, no LAN, Mallampati 3 airway, no stridor ? ?Respiratory - equal breath sounds bilaterally, no wheezing or rales ? ?CV - s1s2 regular rate and rhythm, no murmurs ? ?Ext - no clubbing, no edema ? ?Skin - no rashes ? ?Psych - normal mood and affect ? ?  ?Pulmonary testing:  ?Spirometry 12/07/11>>1.31 (71%), FEV1% 66 ?Exhaled nitric oxide 12/07/11>>8 ppb (normal) ?PFT 12/05/12 >> FEV1 1.49 (82%), FEV1% 69, FEF 25-75% 1.09 (69%), TLC 4.38 (85%), DLCO 87%, borderline BD response ?Rt pleural fluid 08/08/14 >> glucose 59, protein <3, LDH 6800, WBC 16,640  (86%N), cytology negative for malignancy ?Rt pleural fluid 08/28/14 >> glucose 59, LDH 178, protein 3.5, WBC 303 (68%L) ?RAST 06/21/17 >> dust mites, IgE 14 ?PFT 08/23/17 >> FEV1 1.21 (73%), FEV1% 65, TLC 4.02 (91%), DLCO 71%, no BD ? ?Chest Imaging:  ?CT chest 08/02/14 >> RML consolidation, small Rt pleural effusion ?CT chest 08/09/14 >> Rt lower lobe thin walled cavity, scattered GGO and interlobular septal thickening ?CT chest 09/18/14 >> mild Rt pleural thickening, small Rt pleural effusion, 4 mm nodule LUL, improved RML consolidation, mild RML BTX, RLL cavity 2.5 cm from 3.1 cm, mild BTX lingula ?CT chest 10/16/14 >> decreased RLL PNA w/o cavity ?CT chest 04/27/16 >> 4 mm LUL nodule stable, mild BTX and scarring b/l ? ?Sleep Tests:  ? ? ?Cardiac Tests:  ?Lt heart cath 01/24/12 >> Normal coronaries, LVEDP 8 mmHg, EF 55% ?Echo 08/03/14 >> EF 50 to 55%, mild MR ? ?Social History:  ?She  reports that she has never smoked. She has never used smokeless tobacco. She reports that she does not currently use alcohol. She reports that she does not use drugs. ? ?Family History:  ?Her family history includes Heart attack in her father; Macular degeneration in her mother. ?  ? ? ?Assessment/Plan:  ? ?Subacute cough. ?- increased after having COVID last month ?- she also reports increased reflux symptoms ?- will have  her to trial of prilosec for two weeks ?- Sip water when you have the urge to cough ?- Salt water gargle twice per day until cough is better ?- Avoid forcing a cough or clearing your throat since these can cause more throat irritation and cough ?- 1 teaspoon of local honey as needed to help reduce cough ? ?Moderate persistent asthma with eosinophilic phenotype. ?- started on dupixent December 2019, doing home injections ?- she will call next month to change symbicort to 80 two puffs bid ?- discussed how she can taper down other medications as tolerated ? ?Allergic rhinitis. ?- continue singulair and xyzal for now ?   ?Bronchiectasis. ?- likely related to previous episode of pneumonia ?- bronchial hygiene ? ? ?Time Spent Involved in Patient Care on Day of Examination:  ?38 minutes ? ?Follow up:  ? ?Patient Instructions  ?Try using prilosec for two weeks to see if this helps with reflux and reduces your cough ? ?Sip water when you have the urge to cough ? ?Salt water gargle twice per day until cough is better ? ?Avoid forcing a cough or clearing your throat since these can cause more throat irritation and cough ? ?1 teaspoon of local honey as needed to help reduce cough ? ?Call next month to get new script for symbicort at lower dose ? ?Follow up in 1 year ? ?Medication List:  ? ?Allergies as of 06/16/2021   ? ?   Reactions  ? Fosamax [alendronate Sodium] Nausea Only  ? Sulfonamide Derivatives Rash  ? ?  ? ?  ?Medication List  ?  ? ?  ? Accurate as of June 16, 2021  4:06 PM. If you have any questions, ask your nurse or doctor.  ?  ?  ? ?  ? ?albuterol 108 (90 Base) MCG/ACT inhaler ?Commonly known as: VENTOLIN HFA ?Inhale 2 puffs into the lungs every 6 (six) hours as needed for wheezing or shortness of breath. ?  ?diclofenac 75 MG EC tablet ?Commonly known as: VOLTAREN ?TAKE 1 TABLET BY MOUTH TWICE A DAY AS NEEDED FOR BACK PAIN ?  ?Dupixent 300 MG/2ML prefilled syringe ?Generic drug: dupilumab ?INJECT '300MG'$  (1 SYRINGE) SUBCUTANEOUSLY EVERY OTHER WEEK. ?  ?levocetirizine 5 MG tablet ?Commonly known as: XYZAL ?TAKE 1 TABLET BY MOUTH EVERY DAY IN THE EVENING FOR ALLERGIES. ?  ?montelukast 10 MG tablet ?Commonly known as: SINGULAIR ?Take 1 tablet (10 mg total) by mouth at bedtime. For allergies and asthma ?  ?omeprazole 20 MG capsule ?Commonly known as: PRILOSEC ?Take 1 capsule (20 mg total) by mouth daily. ?Started by: [name], MD ?  ?PreserVision/Lutein Caps ?Take 1 capsule by mouth 2 (two) times daily. ?  ?Multi For Her 50+ Tabs ?Take 1 tablet by mouth daily. ?  ?raloxifene 60 MG tablet ?Commonly known as: EVISTA ?Take 1 tablet  (60 mg total) by mouth daily. For bone health. ?  ?Symbicort 160-4.5 MCG/ACT inhaler ?Generic drug: budesonide-formoterol ?INHALE 2 PUFFS INTO THE LUNGS TWO TIMES DAILY ?  ?Vitamin D 1000 units capsule ?Take 1,000 Units by mouth daily with breakfast. ?  ? ?  ? ? ?Signature:  ?[name], MD ?[place] ?Pager - [phone] ?06/16/2021, 4:06 PM ?  ? ? ? ? ? ? ? ? ?

## 2021-06-16 NOTE — Telephone Encounter (Signed)
Medication Samples have been provided to the patient. ? ?Drug name: Dupixent autoinjector       Strength: '300mg'$ /10m ?Qty: 1 pen ?LOT: [id number]?Exp.Date: 06/26/2022 ? ?Dosing instructions: Take on Monday, 06/21/21 ? ?Instructed not to place in refrigerator once home. Advised that pen can be left at room temp for 14 days without stability issues. ? ?Refill for Dupixent '300mg'$  [facility] ? ?DKnox Saliva PharmD, MPH, BCPS ?Clinical Pharmacist (Rheumatology and Pulmonology) ?

## 2021-06-22 ENCOUNTER — Telehealth: Payer: Self-pay | Admitting: Primary Care

## 2021-06-22 NOTE — Telephone Encounter (Signed)
Pt called asking if she can get any Dutixent samples. Please advise. ?

## 2021-06-22 NOTE — Telephone Encounter (Signed)
Called patient she is aware we do not have any samples in our office.  ?

## 2021-06-30 ENCOUNTER — Other Ambulatory Visit: Payer: Self-pay | Admitting: Family Medicine

## 2021-06-30 DIAGNOSIS — J309 Allergic rhinitis, unspecified: Secondary | ICD-10-CM

## 2021-07-07 ENCOUNTER — Telehealth: Payer: Self-pay | Admitting: Pulmonary Disease

## 2021-07-07 DIAGNOSIS — H353221 Exudative age-related macular degeneration, left eye, with active choroidal neovascularization: Secondary | ICD-10-CM | POA: Diagnosis not present

## 2021-07-07 DIAGNOSIS — J4540 Moderate persistent asthma, uncomplicated: Secondary | ICD-10-CM

## 2021-07-07 MED ORDER — BUDESONIDE-FORMOTEROL FUMARATE 80-4.5 MCG/ACT IN AERO: 2.0000 | INHALATION_SPRAY | Freq: Two times a day (BID) | RESPIRATORY_TRACT | 12 refills | Status: DC

## 2021-07-07 NOTE — Telephone Encounter (Signed)
Called and spoke to patient this morning to clarify that I was sending in her Symbicort 10mg to her pharmacy. I double checked the pharmacy that she would like it sent to. Patient verbalized understanding. No questions noted. Nothing further needed at this time  ?

## 2021-07-26 ENCOUNTER — Ambulatory Visit (INDEPENDENT_AMBULATORY_CARE_PROVIDER_SITE_OTHER): Payer: Medicare HMO

## 2021-07-26 VITALS — Ht 60.0 in | Wt 132.0 lb

## 2021-07-26 DIAGNOSIS — Z0000 Encounter for general adult medical examination without abnormal findings: Secondary | ICD-10-CM

## 2021-07-26 NOTE — Progress Notes (Signed)
?I connected with [name] today by telephone and verified that I am speaking with the correct person using two identifiers. ?Location patient: home ?Location provider: work ?Persons participating in the virtual visit: Mylinh Cragg, Glenna Durand LPN. ?  ?I discussed the limitations, risks, security and privacy concerns of performing an evaluation and management service by telephone and the availability of in person appointments. I also discussed with the patient that there may be a patient responsible charge related to this service. The patient expressed understanding and verbally consented to this telephonic visit.  ?  ?Interactive audio and video telecommunications were attempted between this provider and patient, however failed, due to patient having technical difficulties OR patient did not have access to video capability.  We continued and completed visit with audio only. ? ?  ? ?Vital signs may be patient reported or missing. ? ?Subjective:  ? [name] is a 81 y.o. female who presents for Medicare Annual (Subsequent) preventive examination. ? ?Review of Systems    ? ?Cardiac Risk Factors include: advanced age (>6mn, >>18women);dyslipidemia ? ?   ?Objective:  ?  ?Today's Vitals  ? 07/26/21 1426  ?Weight: 132 lb (59.9 kg)  ?Height: 5' (1.524 m)  ? ?Body mass index is 25.78 kg/m?. ? ? ?  07/26/2021  ?  2:37 PM 07/24/2020  ?  2:48 PM 06/10/2019  ? 10:34 AM 06/06/2018  ?  9:26 AM 05/31/2017  ? 10:26 AM 04/14/2015  ? 11:16 AM 08/27/2014  ?  5:53 PM  ?Advanced Directives  ?Does Patient Have a Medical Advance Directive? No No No No No No No  ?Does patient want to make changes to medical advance directive?     Yes (MAU/Ambulatory/Procedural Areas - Information given)    ?Would patient like information on creating a medical advance directive?  No - Patient declined Yes (MAU/Ambulatory/Procedural Areas - Information given) Yes (MAU/Ambulatory/Procedural Areas - Information given)   No - patient declined information   ? ? ?Current Medications (verified) ?Outpatient Encounter Medications as of 07/26/2021  ?Medication Sig  ? albuterol (VENTOLIN HFA) 108 (90 Base) MCG/ACT inhaler Inhale 2 puffs into the lungs every 6 (six) hours as needed for wheezing or shortness of breath.  ? budesonide-formoterol (SYMBICORT) 80-4.5 MCG/ACT inhaler Inhale 2 puffs into the lungs 2 (two) times daily.  ? Cholecalciferol (VITAMIN D) 1000 UNITS capsule Take 1,000 Units by mouth daily with breakfast.   ? diclofenac (VOLTAREN) 75 MG EC tablet TAKE 1 TABLET BY MOUTH TWICE A DAY AS NEEDED FOR BACK PAIN  ? DUPIXENT 300 MG/2ML prefilled syringe INJECT '300MG'$  (1 SYRINGE) SUBCUTANEOUSLY EVERY OTHER WEEK.  ? levocetirizine (XYZAL) 5 MG tablet TAKE 1 TABLET BY MOUTH EVERY DAY IN THE EVENING FOR ALLERGIES.  ? montelukast (SINGULAIR) 10 MG tablet Take 1 tablet (10 mg total) by mouth at bedtime. For allergies and asthma  ? Multiple Vitamins-Minerals (MULTI FOR HER 50+) TABS Take 1 tablet by mouth daily.  ? Multiple Vitamins-Minerals (PRESERVISION/LUTEIN) CAPS Take 1 capsule by mouth 2 (two) times daily.  ? omeprazole (PRILOSEC) 20 MG capsule Take 1 capsule (20 mg total) by mouth daily.  ? raloxifene (EVISTA) 60 MG tablet Take 1 tablet (60 mg total) by mouth daily. For bone health.  ? SYMBICORT 160-4.5 MCG/ACT inhaler INHALE 2 PUFFS INTO THE LUNGS TWO TIMES DAILY (Patient not taking: Reported on 07/26/2021)  ? ?No facility-administered encounter medications on file as of 07/26/2021.  ? ? ?Allergies (verified) ?Fosamax [alendronate sodium] and Sulfonamide derivatives  ? ?  History: ?Past Medical History:  ?Diagnosis Date  ? Arthritis   ? Asthma   ? Cancer of breast ([place])   ? 33 years ago, left,   ? Hearing loss   ? both ears, wwears hearing aides  ? Hyperlipidemia   ? Macular degeneration   ? Osteopenia   ? Pneumonia   ? ?Past Surgical History:  ?Procedure Laterality Date  ? ABDOMINAL HYSTERECTOMY    ? BREAST ENHANCEMENT SURGERY    ? CESAREAN SECTION    ? x 2  ? COLONOSCOPY  WITH PROPOFOL N/A 04/21/2014  ? Procedure: COLONOSCOPY WITH PROPOFOL;  Surgeon: Garlan Fair, MD;  Location: [facility];  Service: Endoscopy;  Laterality: N/A;  ? EYE SURGERY Bilateral march 2015 and june 2015  ? lens for cataracts  ? LEFT HEART CATHETERIZATION WITH CORONARY ANGIOGRAM N/A 01/24/2012  ? Procedure: LEFT HEART CATHETERIZATION WITH CORONARY ANGIOGRAM;  Surgeon: Sueanne Margarita, MD;  Location: [facility];  Service: Cardiovascular;  Laterality: N/A;  ? MASTECTOMY  33 yrs ago  ? bilateral  ? ?Family History  ?Problem Relation Age of Onset  ? Macular degeneration Mother   ? Heart attack Father   ? ?Social History  ? ?Socioeconomic History  ? Marital status: Married  ?  Spouse name: Not on file  ? Number of children: Not on file  ? Years of education: Not on file  ? Highest education level: Not on file  ?Occupational History  ? Occupation: retired  ?Tobacco Use  ? Smoking status: Never  ? Smokeless tobacco: Never  ?Vaping Use  ? Vaping Use: Never used  ?Substance and Sexual Activity  ? Alcohol use: Not Currently  ?  Comment: occasionally  ? Drug use: No  ? Sexual activity: Not Currently  ?Other Topics Concern  ? Not on file  ?Social History Narrative  ? Married.  ? 2 children, 6 grandchildren.  ? Retired. Previously was Pharmacist, hospital.   ? Enjoys participating in church.   ? ?Social Determinants of Health  ? ?Financial Resource Strain: Low Risk   ? Difficulty of Paying Living Expenses: Not hard at all  ?Food Insecurity: No Food Insecurity  ? Worried About Charity fundraiser in the Last Year: Never true  ? Ran Out of Food in the Last Year: Never true  ?Transportation Needs: No Transportation Needs  ? Lack of Transportation (Medical): No  ? Lack of Transportation (Non-Medical): No  ?Physical Activity: Insufficiently Active  ? Days of Exercise per Week: 7 days  ? Minutes of Exercise per Session: 10 min  ?Stress: No Stress Concern Present  ? Feeling of Stress : Not at all  ?Social Connections: Not on file   ? ? ?Tobacco Counseling ?Counseling given: Not Answered ? ? ?Clinical Intake: ? ?Pre-visit preparation completed: Yes ? ?Pain : No/denies pain ? ?  ? ?Nutritional Status: BMI 25 -29 Overweight ?Nutritional Risks: Nausea/ vomitting/ diarrhea (had diarrhea when taking omeprazole) ?Diabetes: No ? ?How often do you need to have someone help you when you read instructions, pamphlets, or other written materials from your doctor or pharmacy?: 1 - Never ? ?Diabetic? no ? ?Interpreter Needed?: No ? ?Information entered by :: NAllen LPN ? ? ?Activities of Daily Living ? ?  07/26/2021  ?  2:39 PM  ?In your present state of health, do you have any difficulty performing the following activities:  ?Hearing? 1  ?Comment hearing aids  ?Vision? 1  ?Comment macular degeneration  ?Difficulty concentrating or making decisions?  0  ?Walking or climbing stairs? 1  ?Dressing or bathing? 0  ?Doing errands, shopping? 0  ?Preparing Food and eating ? N  ?Using the Toilet? N  ?In the past six months, have you accidently leaked urine? Y  ?Do you have problems with loss of bowel control? N  ?Managing your Medications? N  ?Managing your Finances? N  ?Housekeeping or managing your Housekeeping? N  ? ? ?Patient Care Team: ?Pleas Koch, NP as PCP - General (Internal Medicine) ? ?Indicate any recent Medical Services you may have received from other than [facility] providers in the past year (date may be approximate). ? ?   ?Assessment:  ? This is a routine wellness examination for Joelee. ? ?Hearing/Vision screen ?Vision Screening - Comments:: Regular eye exams, [place] ? ?Dietary issues and exercise activities discussed: ?Current Exercise Habits: Home exercise routine, Type of exercise: stretching, Time (Minutes): 10, Frequency (Times/Week): 7, Weekly Exercise (Minutes/Week): 70 ? ? Goals Addressed   ? ?  ?  ?  ?  ? This Visit's Progress  ?  Patient Stated     ?  07/26/2021, wants to lose belly ?  ? ?  ? ?Depression Screen ? ?  07/26/2021  ?   2:39 PM 07/24/2020  ?  2:49 PM 06/10/2019  ? 10:37 AM 06/06/2018  ?  9:27 AM 05/31/2017  ? 10:05 AM 10/10/2016  ?  8:13 AM  ?PHQ 2/9 Scores  ?PHQ - 2 Score 0 0 0 0 0 0  ?PHQ- 9 Score  0 0 0 0   ?  ?Fall Risk ? ?  07/26/2021  ?  2:

## 2021-07-26 NOTE — Patient Instructions (Signed)
[name] , ?Thank you for taking time to come for your Medicare Wellness Visit. I appreciate your ongoing commitment to your health goals. Please review the following plan we discussed and let me know if I can assist you in the future.  ? ?Screening recommendations/referrals: ?Colonoscopy: not required ?Mammogram: not required ?Bone Density: completed 02/09/2021 ?Recommended yearly ophthalmology/optometry visit for glaucoma screening and checkup ?Recommended yearly dental visit for hygiene and checkup ? ?Vaccinations: ?Influenza vaccine: due 10/26/2021 ?Pneumococcal vaccine: completed 01/16/2018 ?Tdap vaccine: completed 04/30/2021, due 05/01/2031 ?Shingles vaccine: completed   ?Covid-19: 01/27/2020, 05/12/2019, 04/20/2019 ? ?Advanced directives: Advance directive discussed with you today.  ? ?Conditions/risks identified: none ? ?Next appointment: Follow up in one year for your annual wellness visit  ? ? ?Preventive Care 35 Years and Older, Female ?Preventive care refers to lifestyle choices and visits with your health care provider that can promote health and wellness. ?What does preventive care include? ?A yearly physical exam. This is also called an annual well check. ?Dental exams once or twice a year. ?Routine eye exams. Ask your health care provider how often you should have your eyes checked. ?Personal lifestyle choices, including: ?Daily care of your teeth and gums. ?Regular physical activity. ?Eating a healthy diet. ?Avoiding tobacco and drug use. ?Limiting alcohol use. ?Practicing safe sex. ?Taking low-dose aspirin every day. ?Taking vitamin and mineral supplements as recommended by your health care provider. ?What happens during an annual well check? ?The services and screenings done by your health care provider during your annual well check will depend on your age, overall health, lifestyle risk factors, and family history of disease. ?Counseling  ?Your health care provider may ask you questions about  your: ?Alcohol use. ?Tobacco use. ?Drug use. ?Emotional well-being. ?Home and relationship well-being. ?Sexual activity. ?Eating habits. ?History of falls. ?Memory and ability to understand (cognition). ?Work and work Statistician. ?Reproductive health. ?Screening  ?You may have the following tests or measurements: ?Height, weight, and BMI. ?Blood pressure. ?Lipid and cholesterol levels. These may be checked every 5 years, or more frequently if you are over 78 years old. ?Skin check. ?Lung cancer screening. You may have this screening every year starting at age 69 if you have a 30-pack-year history of smoking and currently smoke or have quit within the past 15 years. ?Fecal occult blood test (FOBT) of the stool. You may have this test every year starting at age 58. ?Flexible sigmoidoscopy or colonoscopy. You may have a sigmoidoscopy every 5 years or a colonoscopy every 10 years starting at age 69. ?Hepatitis C blood test. ?Hepatitis B blood test. ?Sexually transmitted disease (STD) testing. ?Diabetes screening. This is done by checking your blood sugar (glucose) after you have not eaten for a while (fasting). You may have this done every 1-3 years. ?Bone density scan. This is done to screen for osteoporosis. You may have this done starting at age 56. ?Mammogram. This may be done every 1-2 years. Talk to your health care provider about how often you should have regular mammograms. ?Talk with your health care provider about your test results, treatment options, and if necessary, the need for more tests. ?Vaccines  ?Your health care provider may recommend certain vaccines, such as: ?Influenza vaccine. This is recommended every year. ?Tetanus, diphtheria, and acellular pertussis (Tdap, Td) vaccine. You may need a Td booster every 10 years. ?Zoster vaccine. You may need this after age 7. ?Pneumococcal 13-valent conjugate (PCV13) vaccine. One dose is recommended after age 2. ?Pneumococcal polysaccharide (PPSV23) vaccine.  One  dose is recommended after age 22. ?Talk to your health care provider about which screenings and vaccines you need and how often you need them. ?This information is not intended to replace advice given to you by your health care provider. Make sure you discuss any questions you have with your health care provider. ?Document Released: 04/10/2015 Document Revised: 12/02/2015 Document Reviewed: 01/13/2015 ?Elsevier Interactive Patient Education ? 2017 [place]. ? ?Fall Prevention in the Home ?Falls can cause injuries. They can happen to people of all ages. There are many things you can do to make your home safe and to help prevent falls. ?What can I do on the outside of my home? ?Regularly fix the edges of walkways and driveways and fix any cracks. ?Remove anything that might make you trip as you walk through a door, such as a raised step or threshold. ?Trim any bushes or trees on the path to your home. ?Use bright outdoor lighting. ?Clear any walking paths of anything that might make someone trip, such as rocks or tools. ?Regularly check to see if handrails are loose or broken. Make sure that both sides of any steps have handrails. ?Any raised decks and porches should have guardrails on the edges. ?Have any leaves, snow, or ice cleared regularly. ?Use sand or salt on walking paths during winter. ?Clean up any spills in your garage right away. This includes oil or grease spills. ?What can I do in the bathroom? ?Use night lights. ?Install grab bars by the toilet and in the tub and shower. Do not use towel bars as grab bars. ?Use non-skid mats or decals in the tub or shower. ?If you need to sit down in the shower, use a plastic, non-slip stool. ?Keep the floor dry. Clean up any water that spills on the floor as soon as it happens. ?Remove soap buildup in the tub or shower regularly. ?Attach bath mats securely with double-sided non-slip rug tape. ?Do not have throw rugs and other things on the floor that can make  you trip. ?What can I do in the bedroom? ?Use night lights. ?Make sure that you have a light by your bed that is easy to reach. ?Do not use any sheets or blankets that are too big for your bed. They should not hang down onto the floor. ?Have a firm chair that has side arms. You can use this for support while you get dressed. ?Do not have throw rugs and other things on the floor that can make you trip. ?What can I do in the kitchen? ?Clean up any spills right away. ?Avoid walking on wet floors. ?Keep items that you use a lot in easy-to-reach places. ?If you need to reach something above you, use a strong step stool that has a grab bar. ?Keep electrical cords out of the way. ?Do not use floor polish or wax that makes floors slippery. If you must use wax, use non-skid floor wax. ?Do not have throw rugs and other things on the floor that can make you trip. ?What can I do with my stairs? ?Do not leave any items on the stairs. ?Make sure that there are handrails on both sides of the stairs and use them. Fix handrails that are broken or loose. Make sure that handrails are as long as the stairways. ?Check any carpeting to make sure that it is firmly attached to the stairs. Fix any carpet that is loose or worn. ?Avoid having throw rugs at the top or bottom of the stairs.  If you do have throw rugs, attach them to the floor with carpet tape. ?Make sure that you have a light switch at the top of the stairs and the bottom of the stairs. If you do not have them, ask someone to add them for you. ?What else can I do to help prevent falls? ?Wear shoes that: ?Do not have high heels. ?Have rubber bottoms. ?Are comfortable and fit you well. ?Are closed at the toe. Do not wear sandals. ?If you use a stepladder: ?Make sure that it is fully opened. Do not climb a closed stepladder. ?Make sure that both sides of the stepladder are locked into place. ?Ask someone to hold it for you, if possible. ?Clearly mark and make sure that you can  see: ?Any grab bars or handrails. ?First and last steps. ?Where the edge of each step is. ?Use tools that help you move around (mobility aids) if they are needed. These include: ?Canes. ?Walkers. ?Scooters. ?Crutches

## 2021-08-05 ENCOUNTER — Other Ambulatory Visit: Payer: Self-pay | Admitting: Primary Care

## 2021-08-05 DIAGNOSIS — G8929 Other chronic pain: Secondary | ICD-10-CM

## 2021-08-10 ENCOUNTER — Ambulatory Visit (INDEPENDENT_AMBULATORY_CARE_PROVIDER_SITE_OTHER): Payer: Medicare HMO | Admitting: Primary Care

## 2021-08-10 ENCOUNTER — Encounter: Payer: Self-pay | Admitting: Primary Care

## 2021-08-10 VITALS — BP 120/68 | HR 88 | Temp 98.4°F | Ht 60.0 in | Wt 137.0 lb

## 2021-08-10 DIAGNOSIS — R29898 Other symptoms and signs involving the musculoskeletal system: Secondary | ICD-10-CM

## 2021-08-10 DIAGNOSIS — J4540 Moderate persistent asthma, uncomplicated: Secondary | ICD-10-CM

## 2021-08-10 DIAGNOSIS — E782 Mixed hyperlipidemia: Secondary | ICD-10-CM

## 2021-08-10 DIAGNOSIS — J8283 Eosinophilic asthma: Secondary | ICD-10-CM | POA: Diagnosis not present

## 2021-08-10 DIAGNOSIS — G8929 Other chronic pain: Secondary | ICD-10-CM | POA: Insufficient documentation

## 2021-08-10 DIAGNOSIS — M5441 Lumbago with sciatica, right side: Secondary | ICD-10-CM | POA: Diagnosis not present

## 2021-08-10 DIAGNOSIS — Z0000 Encounter for general adult medical examination without abnormal findings: Secondary | ICD-10-CM | POA: Diagnosis not present

## 2021-08-10 DIAGNOSIS — M8580 Other specified disorders of bone density and structure, unspecified site: Secondary | ICD-10-CM | POA: Diagnosis not present

## 2021-08-10 DIAGNOSIS — M79671 Pain in right foot: Secondary | ICD-10-CM

## 2021-08-10 LAB — COMPREHENSIVE METABOLIC PANEL
ALT: 22 U/L (ref 0–35)
AST: 21 U/L (ref 0–37)
Albumin: 4 g/dL (ref 3.5–5.2)
Alkaline Phosphatase: 67 U/L (ref 39–117)
BUN: 19 mg/dL (ref 6–23)
CO2: 25 mEq/L (ref 19–32)
Calcium: 9.6 mg/dL (ref 8.4–10.5)
Chloride: 105 mEq/L (ref 96–112)
Creatinine, Ser: 0.58 mg/dL (ref 0.40–1.20)
GFR: 85.47 mL/min (ref >60.00)
Glucose, Bld: 85 mg/dL (ref 70–99)
Potassium: 4.2 mEq/L (ref 3.5–5.1)
Sodium: 140 mEq/L (ref 135–145)
Total Bilirubin: 0.3 mg/dL (ref 0.2–1.2)
Total Protein: 6.8 g/dL (ref 6.0–8.3)

## 2021-08-10 LAB — LIPID PANEL
Cholesterol: 188 mg/dL (ref 0–200)
HDL: 39.5 mg/dL (ref >39.00)
NonHDL: 148.85
Total CHOL/HDL Ratio: 5
Triglycerides: 270 mg/dL — ABNORMAL HIGH (ref 0.0–149.0)
VLDL: 54 mg/dL — ABNORMAL HIGH (ref 0.0–40.0)

## 2021-08-10 LAB — CBC
HCT: 39 % (ref 36.0–46.0)
Hemoglobin: 12.4 g/dL (ref 12.0–15.0)
MCHC: 31.8 g/dL (ref 30.0–36.0)
MCV: 91.9 fl (ref 78.0–100.0)
Platelets: 336 10*3/uL (ref 150.0–400.0)
RBC: 4.25 Mil/uL (ref 3.87–5.11)
RDW: 14.1 % (ref 11.5–15.5)
WBC: 9 10*3/uL (ref 4.0–10.5)

## 2021-08-10 LAB — LDL CHOLESTEROL, DIRECT: Direct LDL: 113 mg/dL

## 2021-08-10 NOTE — Assessment & Plan Note (Signed)
Immunizations up-to-date. ? ?Mammogram N/A as she has little to no breast tissue since her mastectomies. ?Colonoscopy screening N/A given her age. ?Bone density scan up-to-date. ? ?Encouraged healthy diet and regular exercise.  ? ?Exam today as noted, labs pending. ? ?

## 2021-08-10 NOTE — Assessment & Plan Note (Signed)
Exam today grossly benign. ? ?Uric acid checked 1 year ago which was negative. ?Referral placed to podiatry for evaluation. ?

## 2021-08-10 NOTE — Assessment & Plan Note (Signed)
Repeat lipid panel pending. Not on treatment.  

## 2021-08-10 NOTE — Progress Notes (Signed)
? ?Subjective:  ? ? Patient ID: [name], female    DOB: 14-Dec-1940, 81 y.o.   MRN: [id number] ? ?HPI ? ?[name] is a very pleasant 81 y.o. female who presents today for complete physical and follow up of chronic conditions. ? ?She would also like to discuss right great toe numbness for the last year, and a sensation of "lumps to the bottom of my right foot" for years. She denies erythema and swelling, history of gout.  ? ?Immunizations: ?-Tetanus: 2023 ?-Influenza: Completed last season ?-Covid-19: 3 vaccine ?-Shingles: Completed Zostavax and Shingrix series ?-Pneumonia: Prevnar 13 in 2015, Pneumovax 23 in 2019 ? ?Diet: Fair diet.  ?Exercise: No regular exercise. ? ?Eye exam: Completes annually  ?Dental exam: Completes semi-annually  ? ?Mammogram: Completed in 2020 ?Colonoscopy: Completed in 2016, no further screening needed ?Dexa: Completed in November 2022 ? ?BP Readings from Last 3 Encounters:  ?08/10/21 120/68  ?06/16/21 140/74  ?05/07/21 120/62  ? ? ? ? ? ?Review of Systems  ?Constitutional:  Negative for unexpected weight change.  ?HENT:  Negative for rhinorrhea.   ?Respiratory:  Negative for cough and shortness of breath.   ?Cardiovascular:  Negative for chest pain.  ?Gastrointestinal:  Negative for constipation and diarrhea.  ?Genitourinary:  Negative for difficulty urinating.  ?Musculoskeletal:  Positive for arthralgias.  ?Skin:  Negative for rash.  ?Allergic/Immunologic: Positive for environmental allergies.  ?Neurological:  Negative for dizziness, numbness and headaches.  ?Psychiatric/Behavioral:  The patient is not nervous/anxious.   ? ?   ? ? ?Past Medical History:  ?Diagnosis Date  ? Arthritis   ? Asthma   ? Cancer of breast ([place])   ? 33 years ago, left,   ? Hearing loss   ? both ears, wwears hearing aides  ? Hyperlipidemia   ? Macular degeneration   ? Osteopenia   ? Pneumonia   ? ? ?Social History  ? ?Socioeconomic History  ? Marital status: Married  ?  Spouse name: Not on file  ? Number  of children: Not on file  ? Years of education: Not on file  ? Highest education level: Not on file  ?Occupational History  ? Occupation: retired  ?Tobacco Use  ? Smoking status: Never  ? Smokeless tobacco: Never  ?Vaping Use  ? Vaping Use: Never used  ?Substance and Sexual Activity  ? Alcohol use: Not Currently  ?  Comment: occasionally  ? Drug use: No  ? Sexual activity: Not Currently  ?Other Topics Concern  ? Not on file  ?Social History Narrative  ? Married.  ? 2 children, 6 grandchildren.  ? Retired. Previously was Pharmacist, hospital.   ? Enjoys participating in church.   ? ?Social Determinants of Health  ? ?Financial Resource Strain: Low Risk   ? Difficulty of Paying Living Expenses: Not hard at all  ?Food Insecurity: No Food Insecurity  ? Worried About Charity fundraiser in the Last Year: Never true  ? Ran Out of Food in the Last Year: Never true  ?Transportation Needs: No Transportation Needs  ? Lack of Transportation (Medical): No  ? Lack of Transportation (Non-Medical): No  ?Physical Activity: Insufficiently Active  ? Days of Exercise per Week: 7 days  ? Minutes of Exercise per Session: 10 min  ?Stress: No Stress Concern Present  ? Feeling of Stress : Not at all  ?Social Connections: Not on file  ?Intimate Partner Violence: Not on file  ? ? ?Past Surgical History:  ?Procedure Laterality  Date  ? ABDOMINAL HYSTERECTOMY    ? BREAST ENHANCEMENT SURGERY    ? CESAREAN SECTION    ? x 2  ? COLONOSCOPY WITH PROPOFOL N/A 04/21/2014  ? Procedure: COLONOSCOPY WITH PROPOFOL;  Surgeon: Garlan Fair, MD;  Location: [facility];  Service: Endoscopy;  Laterality: N/A;  ? EYE SURGERY Bilateral march 2015 and june 2015  ? lens for cataracts  ? LEFT HEART CATHETERIZATION WITH CORONARY ANGIOGRAM N/A 01/24/2012  ? Procedure: LEFT HEART CATHETERIZATION WITH CORONARY ANGIOGRAM;  Surgeon: Sueanne Margarita, MD;  Location: [facility];  Service: Cardiovascular;  Laterality: N/A;  ? MASTECTOMY  33 yrs ago  ? bilateral  ? ? ?Family History   ?Problem Relation Age of Onset  ? Macular degeneration Mother   ? Heart attack Father   ? ? ?Allergies  ?Allergen Reactions  ? Fosamax [Alendronate Sodium] Nausea Only  ? Sulfonamide Derivatives Rash  ? ? ?Current Outpatient Medications on File Prior to Visit  ?Medication Sig Dispense Refill  ? albuterol (VENTOLIN HFA) 108 (90 Base) MCG/ACT inhaler Inhale 2 puffs into the lungs every 6 (six) hours as needed for wheezing or shortness of breath. 18 g 2  ? budesonide-formoterol (SYMBICORT) 80-4.5 MCG/ACT inhaler Inhale 2 puffs into the lungs 2 (two) times daily. 1 each 12  ? Cholecalciferol (VITAMIN D) 1000 UNITS capsule Take 1,000 Units by mouth daily with breakfast.     ? diclofenac (VOLTAREN) 75 MG EC tablet TAKE 1 TABLET BY MOUTH TWICE A DAY AS NEEDED FOR BACK PAIN 180 tablet 0  ? DUPIXENT 300 MG/2ML prefilled syringe INJECT '300MG'$  (1 SYRINGE) SUBCUTANEOUSLY EVERY OTHER WEEK. 12 mL 1  ? levocetirizine (XYZAL) 5 MG tablet TAKE 1 TABLET BY MOUTH EVERY DAY IN THE EVENING FOR ALLERGIES. 90 tablet 0  ? montelukast (SINGULAIR) 10 MG tablet Take 1 tablet (10 mg total) by mouth at bedtime. For allergies and asthma 90 tablet 3  ? Multiple Vitamins-Minerals (MULTI FOR HER 50+) TABS Take 1 tablet by mouth daily.    ? Multiple Vitamins-Minerals (PRESERVISION/LUTEIN) CAPS Take 1 capsule by mouth 2 (two) times daily.    ? raloxifene (EVISTA) 60 MG tablet Take 1 tablet (60 mg total) by mouth daily. For bone health. 90 tablet 3  ? ?No current facility-administered medications on file prior to visit.  ? ? ?BP 120/68   Pulse 88   Temp 98.4 ?F (36.9 ?C) (Oral)   Ht 5' (1.524 m)   Wt 137 lb (62.1 kg)   SpO2 97%   BMI 26.76 kg/m?  ?Objective:  ? Physical Exam ?HENT:  ?   Right Ear: Tympanic membrane and ear canal normal.  ?   Left Ear: Tympanic membrane and ear canal normal.  ?   Nose: Nose normal.  ?Eyes:  ?   Conjunctiva/sclera: Conjunctivae normal.  ?   Pupils: Pupils are equal, round, and reactive to light.  ?Neck:  ?    Thyroid: No thyromegaly.  ?Cardiovascular:  ?   Rate and Rhythm: Normal rate and regular rhythm.  ?   Pulses:     ?     Dorsalis pedis pulses are 2+ on the right side.  ?     Posterior tibial pulses are 2+ on the right side.  ?   Heart sounds: No murmur heard. ?Pulmonary:  ?   Effort: Pulmonary effort is normal.  ?   Breath sounds: Normal breath sounds. No rales.  ?Abdominal:  ?   General: Bowel sounds are normal.  ?  Palpations: Abdomen is soft.  ?   Tenderness: There is no abdominal tenderness.  ?Musculoskeletal:     ?   General: Normal range of motion.  ?   Cervical back: Neck supple.  ?   Comments: Normal movement of all toes on right foot.  ?5/5 strength of right great toe.  ?Lymphadenopathy:  ?   Cervical: No cervical adenopathy.  ?Skin: ?   General: Skin is warm and dry.  ?   Findings: No erythema or rash.  ?Neurological:  ?   Mental Status: She is alert and oriented to person, place, and time.  ?   Cranial Nerves: No cranial nerve deficit.  ?   Deep Tendon Reflexes: Reflexes are normal and symmetric.  ? ? ? ? ? ?   ?Assessment & Plan:  ? ? ? ? ?This visit occurred during the SARS-CoV-2 public health emergency.  Safety protocols were in place, including screening questions prior to the visit, additional usage of staff PPE, and extensive cleaning of exam room while observing appropriate contact time as indicated for disinfecting solutions.  ?

## 2021-08-10 NOTE — Assessment & Plan Note (Signed)
Improved. Continue to monitor. 

## 2021-08-10 NOTE — Assessment & Plan Note (Signed)
Bone density scan UTD. ? ?Continue Evista 60 mg daily. ? ?

## 2021-08-10 NOTE — Assessment & Plan Note (Signed)
Significant improvement after PT! ? ?Continue to monitor.  ?

## 2021-08-10 NOTE — Patient Instructions (Signed)
You will be contacted regarding your referral to podiatry.  Please let us know if you have not been contacted within two weeks.  ? ?Stop by the lab prior to leaving today. I will notify you of your results once received.  ? ?It was a pleasure to see you today! ? ?Preventive Care 75 Years and Older, Female ?Preventive care refers to lifestyle choices and visits with your health care provider that can promote health and wellness. Preventive care visits are also called wellness exams. ?What can I expect for my preventive care visit? ?Counseling ?Your health care provider may ask you questions about your: ?Medical history, including: ?Past medical problems. ?Family medical history. ?Pregnancy and menstrual history. ?History of falls. ?Current health, including: ?Memory and ability to understand (cognition). ?Emotional well-being. ?Home life and relationship well-being. ?Sexual activity and sexual health. ?Lifestyle, including: ?Alcohol, nicotine or tobacco, and drug use. ?Access to firearms. ?Diet, exercise, and sleep habits. ?Work and work Statistician. ?Sunscreen use. ?Safety issues such as seatbelt and bike helmet use. ?Physical exam ?Your health care provider will check your: ?Height and weight. These may be used to calculate your BMI (body mass index). BMI is a measurement that tells if you are at a healthy weight. ?Waist circumference. This measures the distance around your waistline. This measurement also tells if you are at a healthy weight and may help predict your risk of certain diseases, such as type 2 diabetes and high blood pressure. ?Heart rate and blood pressure. ?Body temperature. ?Skin for abnormal spots. ?What immunizations do I need? ? ?Vaccines are usually given at various ages, according to a schedule. Your health care provider will recommend vaccines for you based on your age, medical history, and lifestyle or other factors, such as travel or where you work. ?What tests do I need? ?Screening ?Your  health care provider may recommend screening tests for certain conditions. This may include: ?Lipid and cholesterol levels. ?Hepatitis C test. ?Hepatitis B test. ?HIV (human immunodeficiency virus) test. ?STI (sexually transmitted infection) testing, if you are at risk. ?Lung cancer screening. ?Colorectal cancer screening. ?Diabetes screening. This is done by checking your blood sugar (glucose) after you have not eaten for a while (fasting). ?Mammogram. Talk with your health care provider about how often you should have regular mammograms. ?BRCA-related cancer screening. This may be done if you have a family history of breast, ovarian, tubal, or peritoneal cancers. ?Bone density scan. This is done to screen for osteoporosis. ?Talk with your health care provider about your test results, treatment options, and if necessary, the need for more tests. ?Follow these instructions at home: ?Eating and drinking ? ?Eat a diet that includes fresh fruits and vegetables, whole grains, lean protein, and low-fat dairy products. Limit your intake of foods with high amounts of sugar, saturated fats, and salt. ?Take vitamin and mineral supplements as recommended by your health care provider. ?Do not drink alcohol if your health care provider tells you not to drink. ?If you drink alcohol: ?Limit how much you have to 0-1 drink a day. ?Know how much alcohol is in your drink. In the [place] one drink equals one 12 oz bottle of beer (355 mL), one 5 oz glass of wine (148 mL), or one 1? oz glass of hard liquor (44 mL). ?Lifestyle ?Brush your teeth every morning and night with fluoride toothpaste. Floss one time each day. ?Exercise for at least 30 minutes 5 or more days each week. ?Do not use any products that contain nicotine or tobacco.  These products include cigarettes, chewing tobacco, and vaping devices, such as e-cigarettes. If you need help quitting, ask your health care provider. ?Do not use drugs. ?If you are sexually active, practice  safe sex. Use a condom or other form of protection in order to prevent STIs. ?Take aspirin only as told by your health care provider. Make sure that you understand how much to take and what form to take. Work with your health care provider to find out whether it is safe and beneficial for you to take aspirin daily. ?Ask your health care provider if you need to take a cholesterol-lowering medicine (statin). ?Find healthy ways to manage stress, such as: ?Meditation, yoga, or listening to music. ?Journaling. ?Talking to a trusted person. ?Spending time with friends and family. ?Minimize exposure to UV radiation to reduce your risk of skin cancer. ?Safety ?Always wear your seat belt while driving or riding in a vehicle. ?Do not drive: ?If you have been drinking alcohol. Do not ride with someone who has been drinking. ?When you are tired or distracted. ?While texting. ?If you have been using any mind-altering substances or drugs. ?Wear a helmet and other protective equipment during sports activities. ?If you have firearms in your house, make sure you follow all gun safety procedures. ?What's next? ?Visit your health care provider once a year for an annual wellness visit. ?Ask your health care provider how often you should have your eyes and teeth checked. ?Stay up to date on all vaccines. ?This information is not intended to replace advice given to you by your health care provider. Make sure you discuss any questions you have with your health care provider. ?Document Revised: 09/09/2020 Document Reviewed: 09/09/2020 ?Elsevier Patient Education ? [place]. ? ?

## 2021-08-10 NOTE — Assessment & Plan Note (Signed)
Following with pulmonology, office notes reviewed from April 2023. ? ?Continue Dupixent 300 mg every other week, Symbicort 80-4.5 mcg (2 puffs) BID, Singulair 10 mg HS, Xyzal 5 mg.  ? ?Recommended she update her pulmonologist given increased use of albuterol since a dose reduction of Symbicort.  ?

## 2021-08-11 DIAGNOSIS — H353221 Exudative age-related macular degeneration, left eye, with active choroidal neovascularization: Secondary | ICD-10-CM | POA: Diagnosis not present

## 2021-08-12 ENCOUNTER — Other Ambulatory Visit: Payer: Self-pay | Admitting: Primary Care

## 2021-08-12 DIAGNOSIS — J309 Allergic rhinitis, unspecified: Secondary | ICD-10-CM

## 2021-08-12 DIAGNOSIS — J4540 Moderate persistent asthma, uncomplicated: Secondary | ICD-10-CM

## 2021-08-18 ENCOUNTER — Ambulatory Visit: Payer: Medicare HMO | Admitting: Podiatry

## 2021-08-18 ENCOUNTER — Encounter: Payer: Self-pay | Admitting: Podiatry

## 2021-08-18 ENCOUNTER — Ambulatory Visit (INDEPENDENT_AMBULATORY_CARE_PROVIDER_SITE_OTHER): Payer: Medicare HMO

## 2021-08-18 DIAGNOSIS — M19071 Primary osteoarthritis, right ankle and foot: Secondary | ICD-10-CM | POA: Diagnosis not present

## 2021-08-18 DIAGNOSIS — M792 Neuralgia and neuritis, unspecified: Secondary | ICD-10-CM

## 2021-08-18 DIAGNOSIS — Z853 Personal history of malignant neoplasm of breast: Secondary | ICD-10-CM | POA: Insufficient documentation

## 2021-08-18 DIAGNOSIS — E739 Lactose intolerance, unspecified: Secondary | ICD-10-CM | POA: Insufficient documentation

## 2021-08-18 DIAGNOSIS — R197 Diarrhea, unspecified: Secondary | ICD-10-CM | POA: Insufficient documentation

## 2021-08-18 DIAGNOSIS — M778 Other enthesopathies, not elsewhere classified: Secondary | ICD-10-CM

## 2021-08-18 DIAGNOSIS — J984 Other disorders of lung: Secondary | ICD-10-CM | POA: Insufficient documentation

## 2021-08-18 DIAGNOSIS — E7800 Pure hypercholesterolemia, unspecified: Secondary | ICD-10-CM | POA: Insufficient documentation

## 2021-08-18 DIAGNOSIS — Z9013 Acquired absence of bilateral breasts and nipples: Secondary | ICD-10-CM | POA: Insufficient documentation

## 2021-08-18 DIAGNOSIS — J4520 Mild intermittent asthma, uncomplicated: Secondary | ICD-10-CM | POA: Insufficient documentation

## 2021-08-18 DIAGNOSIS — M1990 Unspecified osteoarthritis, unspecified site: Secondary | ICD-10-CM | POA: Insufficient documentation

## 2021-08-18 HISTORY — DX: Neuralgia and neuritis, unspecified: M79.2

## 2021-08-18 HISTORY — DX: Acquired absence of bilateral breasts and nipples: Z90.13

## 2021-08-18 NOTE — Progress Notes (Signed)
Subjective:  Patient ID: [name], female    DOB: 03-23-1941,  MRN: [id number] HPI Chief Complaint  Patient presents with   Foot Pain    1st, 2nd, 3rd toes right - numbness x 1 year+, mentioned to PCP but said not to worry, feels bumps on the bottom when walking, doesn't affect walking or balance, just concerned it might worsen   New Patient (Initial Visit)    81 y.o. female presents with the above complaint.   ROS: Denies fever chills nausea vomiting muscle aches pains calf pain back pain chest pain shortness of breath.  Past Medical History:  Diagnosis Date   Arthritis    Asthma    Cancer of breast ([place])    33 years ago, left,    COVID-19 05/11/2021   Dysuria 10/03/2019   Hearing loss    both ears, wwears hearing aides   Hyperlipidemia    Laceration of head 05/07/2021   Macular degeneration    Osteopenia    Pneumonia    Past Surgical History:  Procedure Laterality Date   ABDOMINAL HYSTERECTOMY     BREAST ENHANCEMENT SURGERY     CESAREAN SECTION     x 2   COLONOSCOPY WITH PROPOFOL N/A 04/21/2014   Procedure: COLONOSCOPY WITH PROPOFOL;  Surgeon: Garlan Fair, MD;  Location: [facility];  Service: Endoscopy;  Laterality: N/A;   EYE SURGERY Bilateral march 2015 and june 2015   lens for cataracts   LEFT HEART CATHETERIZATION WITH CORONARY ANGIOGRAM N/A 01/24/2012   Procedure: LEFT HEART CATHETERIZATION WITH CORONARY ANGIOGRAM;  Surgeon: Sueanne Margarita, MD;  Location: [facility]ATH [facility];  Service: Cardiovascular;  Laterality: N/A;   MASTECTOMY  33 yrs ago   bilateral    Current Outpatient Medications:    albuterol (VENTOLIN HFA) 108 (90 Base) MCG/ACT inhaler, Inhale 2 puffs into the lungs every 6 (six) hours as needed for wheezing or shortness of breath., Disp: 18 g, Rfl: 2   budesonide-formoterol (SYMBICORT) 80-4.5 MCG/ACT inhaler, Inhale 2 puffs into the lungs 2 (two) times daily., Disp: 1 each, Rfl: 12   Cholecalciferol (VITAMIN D) 1000 UNITS capsule, Take 1,000 Units  by mouth daily with breakfast. , Disp: , Rfl:    diclofenac (VOLTAREN) 75 MG EC tablet, TAKE 1 TABLET BY MOUTH TWICE A DAY AS NEEDED FOR BACK PAIN, Disp: 180 tablet, Rfl: 0   DUPIXENT 300 MG/2ML prefilled syringe, INJECT '300MG'$  (1 SYRINGE) SUBCUTANEOUSLY EVERY OTHER WEEK., Disp: 12 mL, Rfl: 1   LAGEVRIO 200 MG CAPS capsule, , Disp: , Rfl:    levocetirizine (XYZAL) 5 MG tablet, TAKE 1 TABLET BY MOUTH EVERY DAY IN THE EVENING FOR ALLERGIES, Disp: 90 tablet, Rfl: 3   montelukast (SINGULAIR) 10 MG tablet, TAKE 1 TABLET (10 MG TOTAL) BY MOUTH AT BEDTIME. FOR ALLERGIES AND ASTHMA, Disp: 90 tablet, Rfl: 3   Multiple Vitamins-Minerals (MULTI FOR HER 50+) TABS, Take 1 tablet by mouth daily., Disp: , Rfl:    Multiple Vitamins-Minerals (PRESERVISION/LUTEIN) CAPS, Take 1 capsule by mouth 2 (two) times daily., Disp: , Rfl:    raloxifene (EVISTA) 60 MG tablet, Take 1 tablet (60 mg total) by mouth daily. For bone health., Disp: 90 tablet, Rfl: 3  Allergies  Allergen Reactions   Fosamax [Alendronate Sodium] Nausea Only   Sulfonamide Derivatives Rash   Review of Systems Objective:  There were no vitals filed for this visit.  General: Well developed, nourished, in no acute distress, alert and oriented x3   Dermatological: Skin  is warm, dry and supple bilateral. Nails x 10 are well maintained; remaining integument appears unremarkable at this time. There are no open sores, no preulcerative lesions, no rash or signs of infection present.  Vascular: Dorsalis Pedis artery and Posterior Tibial artery pedal pulses are 2/4 bilateral with immedate capillary fill time. Pedal hair growth present. No varicosities and no lower extremity edema present bilateral.   Neruologic: Grossly intact via light touch bilateral. Vibratory intact via tuning fork bilateral. Protective threshold with Semmes Wienstein monofilament intact to all pedal sites bilateral. Patellar and Achilles deep tendon reflexes 2+ bilateral. No Babinski or  clonus noted bilateral.   Musculoskeletal: No gross boney pedal deformities bilateral. No pain, crepitus, or limitation noted with foot and ankle range of motion bilateral. Muscular strength 5/5 in all groups tested bilateral.  Gait: Unassisted, Nonantalgic.    Radiographs:  Radiographs taken today of the right foot demonstrate osseously mature individual she does have some osteoarthritic changes at the DIPJ's of the toes.  She has some early osteoarthritic changes midfoot as well with mild hallux valgus.  No acute findings are identified.  Assessment & Plan:   Assessment: Osteoarthritis and fat pad atrophy  Plan: Follow-up with [place] on an as needed basis.  We did instruct her on appropriate shoe gear particularly something with soft padding.     Carlos Quackenbush T. [place], [place]

## 2021-09-20 ENCOUNTER — Telehealth: Payer: Self-pay | Admitting: Pulmonary Disease

## 2021-09-22 DIAGNOSIS — H353221 Exudative age-related macular degeneration, left eye, with active choroidal neovascularization: Secondary | ICD-10-CM | POA: Diagnosis not present

## 2021-09-24 NOTE — Telephone Encounter (Signed)
Attempted to call pt but unable to reach. Unable to leave VM as no mailbox ever kicked in. Due to multiple attempts trying to reach pt and unable to so so, per protocol encounter will be closed.

## 2021-09-25 ENCOUNTER — Other Ambulatory Visit: Payer: Self-pay | Admitting: Primary Care

## 2021-09-25 DIAGNOSIS — Z853 Personal history of malignant neoplasm of breast: Secondary | ICD-10-CM

## 2021-09-25 DIAGNOSIS — M8580 Other specified disorders of bone density and structure, unspecified site: Secondary | ICD-10-CM

## 2021-11-05 ENCOUNTER — Ambulatory Visit (HOSPITAL_COMMUNITY)
Admission: EM | Admit: 2021-11-05 | Discharge: 2021-11-05 | Disposition: A | Discharge status: Home / Self Care | Payer: Medicare HMO | Attending: Emergency Medicine | Admitting: Emergency Medicine

## 2021-11-05 DIAGNOSIS — Z20822 Contact with and (suspected) exposure to covid-19: Secondary | ICD-10-CM | POA: Diagnosis not present

## 2021-11-05 DIAGNOSIS — J029 Acute pharyngitis, unspecified: Secondary | ICD-10-CM | POA: Diagnosis not present

## 2021-11-05 LAB — POCT RAPID STREP A, ED / UC: Streptococcus, Group A Screen (Direct): NEGATIVE

## 2021-11-05 NOTE — ED Provider Notes (Signed)
[place]    CSN: [id number] Arrival date & time: 11/05/21  1733     History   Chief Complaint Chief Complaint  Patient presents with   Sore Throat   Fever    HPI [name] is a 81 y.o. female.  Presents with 2-week history of sore throat.  Reports minor intermittent sore throat on and off, but worsened today.  She has some discomfort with swallowing and feels her glands are swollen.  She tried a Tylenol a couple days ago with minimal relief. Additionally reports some nasal congestion.  Has been taking allergy medicine.  She takes daily Symbicort. Denies any fever, chills, cough, shortness of breath, chest pain, abdominal pain, vomiting/diarrhea, rash.  Grandchildren both had hand-foot-and-mouth last week.  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   Arthritis, degenerative 08/18/2021   Diarrhea 08/18/2021   Disease of lung 08/18/2021   Lactose intolerance 08/18/2021   Mild intermittent asthma, uncomplicated 03/54/6568   Peripheral neuralgia 08/18/2021   Personal history of malignant neoplasm of breast 08/18/2021   Pure hypercholesterolemia 08/18/2021   Chronic foot pain, right 08/10/2021   Lower extremity weakness 05/07/2021   Joint pain 08/06/2020   Rash and nonspecific skin eruption 06/14/2019   Pain of right lower extremity 06/21/2018   Lobar pneumonia ([place]) 12/75/1700   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   Acute cough 06/05/2017   Preventative health care 06/05/2017   Chronic back pain 01/06/2017   Osteopenia 07/18/2016   Malnutrition of moderate degree ([place]) 08/28/2014    Absolute anemia    Abdominal lymphadenopathy 08/06/2014   Abdominal pain    Exercise-induced asthma 04/19/2012   Allergic rhinitis 04/19/2012   Hyperlipidemia 03/10/2010   Moderate persistent asthma 03/10/2010    Past Surgical History:  Procedure Laterality Date   ABDOMINAL HYSTERECTOMY     BREAST ENHANCEMENT SURGERY     CESAREAN SECTION     x 2   COLONOSCOPY WITH PROPOFOL N/A 04/21/2014   Procedure: COLONOSCOPY WITH PROPOFOL;  Surgeon: Garlan Fair, MD;  Location: [facility];  Service: Endoscopy;  Laterality: N/A;   EYE SURGERY Bilateral march 2015 and june 2015   lens for cataracts   LEFT HEART CATHETERIZATION WITH CORONARY ANGIOGRAM N/A 01/24/2012   Procedure: LEFT HEART CATHETERIZATION WITH CORONARY ANGIOGRAM;  Surgeon: Sueanne Margarita, MD;  Location: [facility] CATH LAB;  Service: Cardiovascular;  Laterality: N/A;   MASTECTOMY  33 yrs ago   bilateral    OB History   No obstetric history on file.      Home Medications    Prior to Admission medications   Medication Sig Start Date End Date Taking? Authorizing Provider  albuterol (VENTOLIN HFA) 108 (90 Base) MCG/ACT inhaler Inhale 2 puffs into the lungs every 6 (six) hours as needed for wheezing or shortness of breath. 10/09/20   Bedsole, Amy E, MD  budesonide-formoterol (SYMBICORT) 80-4.5 MCG/ACT inhaler Inhale 2 puffs into the lungs 2 (two) times daily. 07/07/21   [name], MD  Cholecalciferol (VITAMIN D) 1000 UNITS capsule Take 1,000 Units by mouth daily with breakfast.     [provider]  diclofenac (VOLTAREN) 75 MG EC tablet TAKE 1 TABLET BY MOUTH TWICE A DAY AS NEEDED FOR BACK PAIN 08/05/21   Pleas Koch, NP  DUPIXENT 300 MG/2ML prefilled syringe INJECT '300MG'$  (1 SYRINGE) SUBCUTANEOUSLY EVERY OTHER WEEK. 06/16/21   [name], MD  LAGEVRIO 200 MG CAPS capsule  05/11/21   [provider]  levocetirizine (XYZAL) 5 MG tablet TAKE 1 TABLET BY MOUTH EVERY DAY IN THE EVENING FOR ALLERGIES 08/13/21    Pleas Koch, NP  montelukast (SINGULAIR) 10 MG tablet TAKE 1 TABLET (10 MG TOTAL) BY MOUTH AT BEDTIME. FOR ALLERGIES AND ASTHMA 08/13/21   Pleas Koch, NP  Multiple Vitamins-Minerals (MULTI FOR HER 50+) TABS Take 1 tablet by mouth daily.    [provider]  Multiple Vitamins-Minerals (PRESERVISION/LUTEIN) CAPS Take 1 capsule by mouth 2 (two) times daily.    [provider]  raloxifene (EVISTA) 60 MG tablet TAKE 1 TABLET (60 MG TOTAL) BY MOUTH DAILY. FOR BONE HEALTH. 09/26/21   Pleas Koch, NP    Family History Family History  Problem Relation Age of Onset   Macular degeneration Mother    Heart attack Father     Social History Social History   Tobacco Use   Smoking status: Never   Smokeless tobacco: Never  Vaping Use   Vaping Use: Never used  Substance Use Topics   Alcohol use: Not Currently    Comment: occasionally   Drug use: No     Allergies   Fosamax [alendronate sodium] and Sulfonamide derivatives   Review of Systems Review of Systems  Constitutional:  Positive for fever.  Per HPI   Physical Exam Triage Vital Signs ED Triage Vitals [11/05/21 1843]  Enc Vitals Group     BP (!) 181/88     Pulse Rate 90     Resp 18     Temp 98.2 F (36.8 C)     Temp Source Oral     SpO2 95 %     Weight      Height      Head Circumference      Peak Flow      Pain Score      Pain Loc      Pain Edu?      Excl. in [place]?    No data found.  Updated Vital Signs BP (!) 181/88 (BP Location: Left Arm)   Pulse 90   Temp 98.2 F (36.8 C) (Oral)   Resp 18   SpO2 95%     Physical Exam Vitals and nursing note reviewed.  Constitutional:      General: She is not in acute distress. HENT:     Nose: No congestion.     Mouth/Throat:     Mouth: Mucous membranes are moist.     Pharynx: Uvula midline. No posterior oropharyngeal erythema.     Tonsils: No tonsillar exudate or tonsillar abscesses. 0 on the right. 0 on the left.  Eyes:      Conjunctiva/sclera: Conjunctivae normal.     Pupils: Pupils are equal, round, and reactive to light.  Cardiovascular:     Rate and Rhythm: Normal rate and regular rhythm.     Heart sounds: Normal heart sounds.  Pulmonary:     Effort: Pulmonary effort is normal. No respiratory distress.     Breath sounds: Normal breath sounds.  No wheezing.  Abdominal:     General: Bowel sounds are normal.     Palpations: Abdomen is soft.     Tenderness: There is no abdominal tenderness.  Musculoskeletal:     Cervical back: Normal range of motion.  Lymphadenopathy:     Cervical: No cervical adenopathy.  Neurological:     Mental Status: She is alert and oriented to person, place, and time.     UC Treatments / Results  Labs (all labs ordered are listed, but only abnormal results are displayed) Labs Reviewed  SARS CORONAVIRUS 2 BY RT PCR  POCT RAPID STREP A, ED / UC    EKG  Radiology No results found.  Procedures Procedures  Medications Ordered in UC Medications - No data to display  Initial Impression / Assessment and Plan / UC Course  I have reviewed the triage vital signs and the nursing notes.  Pertinent labs & imaging results that were available during my care of the patient were reviewed by me and considered in my medical decision making (see chart for details).  Strep test negative. COVID test pending. With recent worsening of sore throat, could be a virus.  Especially since grandchildren have been recently sick.  May have been allergies at first.  Exam is unremarkable, and patient is overall well-appearing. Elevated BP.  Recommend symptomatic care at home with Tylenol, warm salt water gargles, lozenges.  Follow-up with PCP as needed.  Return precaution discussed.  Patient agrees to plan  Final Clinical Impressions(s) / UC Diagnoses   Final diagnoses:  Sore throat     Discharge Instructions      Your strep test was negative.  You may have a virus causing your symptoms,  especially because your grandchildren were sick recently. We will call you if your COVID test returns positive. I recommend trying symptomatic care at home with Tylenol, salt water gargles, throat lozenges. Follow-up with your primary care provider as needed. With any worsening symptoms, please go to the emergency department.     ED Prescriptions   None    PDMP not reviewed this encounter.   Kyra Leyland 11/05/21 1926

## 2021-11-05 NOTE — Discharge Instructions (Addendum)
Your strep test was negative.  You may have a virus causing your symptoms, especially because your grandchildren were sick recently. We will call you if your COVID test returns positive. I recommend trying symptomatic care at home with Tylenol, salt water gargles, throat lozenges. Follow-up with your primary care provider as needed. With any worsening symptoms, please go to the emergency department.

## 2021-11-05 NOTE — ED Triage Notes (Signed)
C/o sore throat and fever x 1-2 weeks.

## 2021-11-06 ENCOUNTER — Other Ambulatory Visit: Payer: Self-pay | Admitting: Primary Care

## 2021-11-06 DIAGNOSIS — G8929 Other chronic pain: Secondary | ICD-10-CM

## 2021-11-06 LAB — SARS CORONAVIRUS 2 BY RT PCR: SARS Coronavirus 2 by RT PCR: NEGATIVE

## 2021-11-08 ENCOUNTER — Ambulatory Visit: Payer: Medicare HMO | Admitting: Family Medicine

## 2021-11-10 DIAGNOSIS — H353221 Exudative age-related macular degeneration, left eye, with active choroidal neovascularization: Secondary | ICD-10-CM | POA: Diagnosis not present

## 2021-11-18 ENCOUNTER — Other Ambulatory Visit: Payer: Self-pay | Admitting: Primary Care

## 2021-11-18 DIAGNOSIS — J4540 Moderate persistent asthma, uncomplicated: Secondary | ICD-10-CM

## 2021-11-23 ENCOUNTER — Other Ambulatory Visit: Payer: Self-pay | Admitting: Pharmacist

## 2021-11-23 DIAGNOSIS — J8283 Eosinophilic asthma: Secondary | ICD-10-CM

## 2021-11-23 MED ORDER — DUPIXENT 300 MG/2ML ~~LOC~~ SOSY: PREFILLED_SYRINGE | SUBCUTANEOUS | 1 refills | Status: DC

## 2021-11-23 NOTE — Telephone Encounter (Signed)
Refill sent for [place] to [facility]: [phone]  Dose: 300 mg SQ PFS every 2 weeks  Last OV: 06/16/21 Provider: Dr. Halford Chessman  Next OV: one year (not yet scheduled)  Knox Saliva, PharmD, MPH, BCPS Clinical Pharmacist (Rheumatology and Pulmonology)

## 2021-11-24 ENCOUNTER — Other Ambulatory Visit: Payer: Self-pay | Admitting: Primary Care

## 2021-11-24 DIAGNOSIS — J4540 Moderate persistent asthma, uncomplicated: Secondary | ICD-10-CM

## 2021-11-27 ENCOUNTER — Other Ambulatory Visit: Payer: Self-pay | Admitting: Primary Care

## 2021-11-27 DIAGNOSIS — J4540 Moderate persistent asthma, uncomplicated: Secondary | ICD-10-CM

## 2021-11-30 ENCOUNTER — Telehealth: Payer: Self-pay | Admitting: Primary Care

## 2021-11-30 ENCOUNTER — Telehealth: Payer: Self-pay | Admitting: Pulmonary Disease

## 2021-11-30 DIAGNOSIS — J4540 Moderate persistent asthma, uncomplicated: Secondary | ICD-10-CM

## 2021-11-30 DIAGNOSIS — G8929 Other chronic pain: Secondary | ICD-10-CM

## 2021-11-30 MED ORDER — BUDESONIDE-FORMOTEROL FUMARATE 80-4.5 MCG/ACT IN AERO: 2.0000 | INHALATION_SPRAY | Freq: Two times a day (BID) | RESPIRATORY_TRACT | 12 refills | Status: DC

## 2021-11-30 NOTE — Telephone Encounter (Signed)
Called and spoke with patient.  Patient stated she was needing a Symbicort 160 refill sent to [facility]  Symbicort 160 prescription was sent last 07/07/21 with 12 refills.  I called [facility] and spoke with pharmacy.  Symbicort 160 prescription was not showing any new refills.  New Symbicort 160 prescription sent to [facility].  Nothing further at this time.

## 2021-11-30 NOTE — Telephone Encounter (Signed)
Please notify patient that I sent a 3 month supply for diclofenac in August 2023. Has she contacted her pharmacy?

## 2021-12-01 NOTE — Telephone Encounter (Signed)
Called patient reviewed all information and repeated back to me. Will call if any questions.  ? ?

## 2021-12-06 ENCOUNTER — Telehealth: Payer: Self-pay | Admitting: Pulmonary Disease

## 2021-12-06 MED ORDER — SYMBICORT 160-4.5 MCG/ACT IN AERO: 2.0000 | INHALATION_SPRAY | Freq: Two times a day (BID) | RESPIRATORY_TRACT | 4 refills | Status: DC

## 2021-12-06 NOTE — Telephone Encounter (Signed)
Called and spoke to patient and verified pharmacy. Nothing further needed

## 2022-01-05 DIAGNOSIS — H353221 Exudative age-related macular degeneration, left eye, with active choroidal neovascularization: Secondary | ICD-10-CM | POA: Diagnosis not present

## 2022-02-02 ENCOUNTER — Telehealth: Payer: Self-pay | Admitting: Pulmonary Disease

## 2022-02-02 NOTE — Telephone Encounter (Signed)
Pt called the office stating that she received a call from pt assistance program with [employer] stating that it was time to re-enroll. Pt wants to know what all is needed for her to do. Routing to pharmacy team for them to review.

## 2022-02-02 NOTE — Telephone Encounter (Signed)
Patient returned call.  States she received letter stating she will receive further communication regarding 2024 PAP renewal. Advised her to reach out to [employer] and request 2024 renewal form  Knox Saliva, PharmD, MPH, BCPS, CPP Clinical Pharmacist (Rheumatology and Pulmonology)

## 2022-02-02 NOTE — Telephone Encounter (Signed)
Returned call to patient and advised that [employer] will be mailing out renewal paperwork to her home. She will need to reach out to [employer] to request renewal paperwork be mailed to her.  Knox Saliva, PharmD, MPH, BCPS, CPP Clinical Pharmacist (Rheumatology and Pulmonology)

## 2022-02-08 ENCOUNTER — Other Ambulatory Visit: Payer: Self-pay | Admitting: Primary Care

## 2022-02-08 DIAGNOSIS — G8929 Other chronic pain: Secondary | ICD-10-CM

## 2022-02-23 DIAGNOSIS — H353221 Exudative age-related macular degeneration, left eye, with active choroidal neovascularization: Secondary | ICD-10-CM | POA: Diagnosis not present

## 2022-03-07 ENCOUNTER — Telehealth: Payer: Self-pay

## 2022-03-07 NOTE — Telephone Encounter (Signed)
Received a fax from  [employer] regarding an approval for DUPIXENT patient assistance from 03/28/2022 to 03/28/2023. Approval letter sent to scan center. 

## 2022-03-17 ENCOUNTER — Telehealth: Payer: Self-pay | Admitting: Primary Care

## 2022-03-17 NOTE — Telephone Encounter (Signed)
Patient called and stated that she has a cyst and she wants to talk about it. Call back number [phone]

## 2022-03-17 NOTE — Telephone Encounter (Signed)
Called and spoke to patient the cyst is on the inside on the labia, and is discharging a brownish liquid.  Cyst was red and swollen and then it broke open and started oozing out a brownish liquid. The cyst is not painful unless it is touched. It is tender to the touch. Made patient next available appointment 03/22/22.

## 2022-03-22 ENCOUNTER — Ambulatory Visit (INDEPENDENT_AMBULATORY_CARE_PROVIDER_SITE_OTHER): Payer: Medicare HMO | Admitting: Primary Care

## 2022-03-22 ENCOUNTER — Encounter: Payer: Self-pay | Admitting: Primary Care

## 2022-03-22 VITALS — BP 124/70 | HR 76 | Temp 97.6°F | Ht 60.0 in | Wt 134.0 lb

## 2022-03-22 DIAGNOSIS — R229 Localized swelling, mass and lump, unspecified: Secondary | ICD-10-CM | POA: Diagnosis not present

## 2022-03-22 NOTE — Patient Instructions (Signed)
Please notify me if the spot returns.  It was a pleasure to see you today!

## 2022-03-22 NOTE — Assessment & Plan Note (Signed)
Perhaps a small cyst vs abscess? Nearly healed. No complications or infection. She will notify if symptoms return.

## 2022-03-22 NOTE — Progress Notes (Signed)
Subjective:    Patient ID: [name], female    DOB: 08/21/1940, 81 y.o.   MRN: [id number]  HPI  [name] is a very pleasant 81 y.o. female with a history of osteopenia, asthma, arthritis, anemia, hyperlipidemia who presents today to discuss vaginal mass.   Two weeks ago she noticed a "bump" to the left labia minor. She bump increased in size and became painful. One week ago her bump "popped" and drained a light brown color that was foul smelling. She then developed itching. She then began noticing burning to the site with urination.   Two days ago her symptoms completely resolved including the bump, itching, and burning with urination.   She denies fevers, vaginal discharge.    Review of Systems  Constitutional:  Negative for fever.  Genitourinary:  Negative for dysuria, vaginal bleeding and vaginal discharge.         Past Medical History:  Diagnosis Date   Arthritis    Asthma    Cancer of breast ([place])    33 years ago, left,    COVID-19 05/11/2021   Dysuria 10/03/2019   Hearing loss    both ears, wwears hearing aides   Hyperlipidemia    Laceration of head 05/07/2021   Macular degeneration    Osteopenia    Pneumonia     Social History   Socioeconomic History   Marital status: Married    Spouse name: Not on file   Number of children: Not on file   Years of education: Not on file   Highest education level: Not on file  Occupational History   Occupation: retired  Tobacco Use   Smoking status: Never   Smokeless tobacco: Never  Vaping Use   Vaping Use: Never used  Substance and Sexual Activity   Alcohol use: Not Currently    Comment: occasionally   Drug use: No   Sexual activity: Not Currently  Other Topics Concern   Not on file  Social History Narrative   Married.   2 children, 6 grandchildren.   Retired. Previously was Pharmacist, hospital.    Enjoys participating in church.    Social Determinants of Health   Financial Resource Strain: Low Risk   (07/26/2021)   Overall Financial Resource Strain (CARDIA)    Difficulty of Paying Living Expenses: Not hard at all  Food Insecurity: No Food Insecurity (07/26/2021)   Hunger Vital Sign    Worried About Running Out of Food in the Last Year: Never true    Ran Out of Food in the Last Year: Never true  Transportation Needs: No Transportation Needs (07/26/2021)   PRAPARE - Hydrologist (Medical): No    Lack of Transportation (Non-Medical): No  Physical Activity: Insufficiently Active (07/26/2021)   Exercise Vital Sign    Days of Exercise per Week: 7 days    Minutes of Exercise per Session: 10 min  Stress: No Stress Concern Present (07/26/2021)   [place]    Feeling of Stress : Not at all  Social Connections: Not on file  Intimate Partner Violence: Not At Risk (07/24/2020)   Humiliation, Afraid, Rape, and Kick questionnaire    Fear of Current or Ex-Partner: No    Emotionally Abused: No    Physically Abused: No    Sexually Abused: No    Past Surgical History:  Procedure Laterality Date   ABDOMINAL HYSTERECTOMY     BREAST ENHANCEMENT  SURGERY     CESAREAN SECTION     x 2   COLONOSCOPY WITH PROPOFOL N/A 04/21/2014   Procedure: COLONOSCOPY WITH PROPOFOL;  Surgeon: Garlan Fair, MD;  Location: [facility];  Service: Endoscopy;  Laterality: N/A;   EYE SURGERY Bilateral march 2015 and june 2015   lens for cataracts   LEFT HEART CATHETERIZATION WITH CORONARY ANGIOGRAM N/A 01/24/2012   Procedure: LEFT HEART CATHETERIZATION WITH CORONARY ANGIOGRAM;  Surgeon: Sueanne Margarita, MD;  Location: [place] [facility] CATH LAB;  Service: Cardiovascular;  Laterality: N/A;   MASTECTOMY  33 yrs ago   bilateral    Family History  Problem Relation Age of Onset   Macular degeneration Mother    Heart attack Father     Allergies  Allergen Reactions   Fosamax [Alendronate Sodium] Nausea Only   Sulfonamide Derivatives Rash     Current Outpatient Medications on File Prior to Visit  Medication Sig Dispense Refill   albuterol (VENTOLIN HFA) 108 (90 Base) MCG/ACT inhaler Inhale 2 puffs into the lungs every 6 (six) hours as needed for wheezing or shortness of breath. 18 g 2   Cholecalciferol (VITAMIN D) 1000 UNITS capsule Take 1,000 Units by mouth daily with breakfast.      diclofenac (VOLTAREN) 75 MG EC tablet TAKE 1 TABLET BY MOUTH TWICE A DAY AS NEEDED FOR BACK PAIN 180 tablet 0   DUPIXENT 300 MG/2ML prefilled syringe INJECT '300MG'$  (1 SYRINGE) SUBCUTANEOUSLY EVERY OTHER WEEK. 12 mL 1   levocetirizine (XYZAL) 5 MG tablet TAKE 1 TABLET BY MOUTH EVERY DAY IN THE EVENING FOR ALLERGIES 90 tablet 3   montelukast (SINGULAIR) 10 MG tablet TAKE 1 TABLET (10 MG TOTAL) BY MOUTH AT BEDTIME. FOR ALLERGIES AND ASTHMA 90 tablet 3   Multiple Vitamins-Minerals (MULTI FOR HER 50+) TABS Take 1 tablet by mouth daily.     Multiple Vitamins-Minerals (PRESERVISION/LUTEIN) CAPS Take 1 capsule by mouth 2 (two) times daily.     raloxifene (EVISTA) 60 MG tablet TAKE 1 TABLET (60 MG TOTAL) BY MOUTH DAILY. FOR BONE HEALTH. 90 tablet 2   SYMBICORT 160-4.5 MCG/ACT inhaler Inhale 2 puffs into the lungs 2 (two) times daily. 1 each 4   No current facility-administered medications on file prior to visit.    BP 124/70 (BP Location: Left Arm, Patient Position: Sitting, Cuff Size: Normal)   Pulse 76   Temp 97.6 F (36.4 C) (Temporal)   Ht 5' (1.524 m)   Wt 134 lb (60.8 kg)   SpO2 98%   BMI 26.17 kg/m  Objective:   Physical Exam Constitutional:      General: She is not in acute distress.    Appearance: She is not ill-appearing.  Genitourinary:      Comments: Tiny, flat, non erythematous, non tender, nearly closed spot to the right labia minora. Skin:    General: Skin is warm and dry.           Assessment & Plan:   Problem List Items Addressed This Visit       Other   Localized skin mass, lump, or swelling - Primary     Perhaps a small cyst vs abscess? Nearly healed. No complications or infection. She will notify if symptoms return.           Pleas Koch, NP

## 2022-04-20 DIAGNOSIS — H353221 Exudative age-related macular degeneration, left eye, with active choroidal neovascularization: Secondary | ICD-10-CM | POA: Diagnosis not present

## 2022-05-09 ENCOUNTER — Other Ambulatory Visit: Payer: Self-pay | Admitting: Pulmonary Disease

## 2022-05-11 ENCOUNTER — Other Ambulatory Visit: Payer: Self-pay | Admitting: Primary Care

## 2022-05-11 DIAGNOSIS — G8929 Other chronic pain: Secondary | ICD-10-CM

## 2022-05-11 NOTE — Telephone Encounter (Signed)
Patient is due for CPE/follow up in late May, this will be required prior to any further refills.  Please schedule, thank you!

## 2022-05-11 NOTE — Telephone Encounter (Signed)
Patient has been scheduled.

## 2022-05-17 ENCOUNTER — Telehealth (HOSPITAL_BASED_OUTPATIENT_CLINIC_OR_DEPARTMENT_OTHER): Payer: Self-pay | Admitting: Pulmonary Disease

## 2022-05-17 DIAGNOSIS — J8283 Eosinophilic asthma: Secondary | ICD-10-CM

## 2022-05-17 MED ORDER — DUPIXENT 300 MG/2ML ~~LOC~~ SOSY: PREFILLED_SYRINGE | SUBCUTANEOUS | 0 refills | Status: DC

## 2022-05-17 NOTE — Telephone Encounter (Signed)
Refill sent for one month only for Dupiexnt. Will send additional refills after f/u appt on 06/02/2022  Knox Saliva, PharmD, MPH, BCPS, CPP Clinical Pharmacist (Rheumatology and Pulmonology)

## 2022-05-17 NOTE — Telephone Encounter (Signed)
Pt. Need new prescription for DUPIXENT 300 MG/2ML prefilled syringe  she is out and needs new order sent to pharmacy she has a F/U apt to see dr. Halford Chessman on March 7th

## 2022-06-02 ENCOUNTER — Ambulatory Visit (HOSPITAL_BASED_OUTPATIENT_CLINIC_OR_DEPARTMENT_OTHER): Payer: Medicare HMO | Admitting: Pulmonary Disease

## 2022-06-02 ENCOUNTER — Encounter (HOSPITAL_BASED_OUTPATIENT_CLINIC_OR_DEPARTMENT_OTHER): Payer: Self-pay | Admitting: Pulmonary Disease

## 2022-06-02 VITALS — BP 118/68 | HR 89 | Ht 60.0 in | Wt 133.4 lb

## 2022-06-02 DIAGNOSIS — J4550 Severe persistent asthma, uncomplicated: Secondary | ICD-10-CM | POA: Diagnosis not present

## 2022-06-02 DIAGNOSIS — J309 Allergic rhinitis, unspecified: Secondary | ICD-10-CM

## 2022-06-02 DIAGNOSIS — J8283 Eosinophilic asthma: Secondary | ICD-10-CM

## 2022-06-02 DIAGNOSIS — J479 Bronchiectasis, uncomplicated: Secondary | ICD-10-CM

## 2022-06-02 NOTE — Progress Notes (Signed)
[name]  Chief Complaint  Patient presents with   Follow-up    Pt states she has been doing okay since last visit. States she has been using her rescue inhaler at least once a day.    Constitutional:  BP 118/68 (BP Location: Left Arm, Patient Position: Sitting, Cuff Size: Normal)   Pulse 89   Ht 5' (1.524 m)   Wt 133 lb 6.4 oz (60.5 kg)   SpO2 97% Comment: RA  BMI 26.05 kg/m   Past Medical History:  Breast cancer, HLD, Macular degeneration, Osteopenia  Past Surgical History:  Her  has a past surgical history that includes Abdominal hysterectomy; Cesarean section; Breast enhancement surgery; left heart catheterization with coronary angiogram (N/A, 01/24/2012); Mastectomy (33 yrs ago); Eye surgery (Bilateral, march 2015 and june 2015); and Colonoscopy with propofol (N/A, 04/21/2014).  Brief Summary:  [name] is a 81 y.o. 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  She had pneumonia with empyema in June 2016.      Subjective:   She has been doing well.  She had some trouble a couple weeks ago and needed to use albuterol more.  Hasn't needed this for the past two weeks.  Not having sinus congestion, sore throat, cough, sputum, wheeze, or shortness of breath.  Sleeping okay.  She gets a lump at injection site after using dupixent.  This lasts about an hour then goes away.  She tried stopping singulair last year, but her breathing got worse so she started it again.  Physical Exam:   Appearance - well kempt   ENMT - no sinus tenderness, no oral exudate, no LAN, Mallampati 3 airway, no stridor  Respiratory - equal breath sounds bilaterally, no wheezing or rales  CV - s1s2 regular rate and rhythm, no murmurs  Ext - no clubbing, no edema  Skin - no rashes  Psych - normal mood and affect     Pulmonary testing:  Spirometry 12/07/11>>1.31 (71%), FEV1% 66 Exhaled nitric oxide 12/07/11>>8 ppb (normal) PFT 12/05/12 >> FEV1  1.49 (82%), FEV1% 69, FEF 25-75% 1.09 (69%), TLC 4.38 (85%), DLCO 87%, borderline BD response Rt pleural fluid 08/08/14 >> glucose 59, protein <3, LDH 6800, WBC 16,640 (86%N), cytology negative for malignancy Rt pleural fluid 08/28/14 >> glucose 59, LDH 178, protein 3.5, WBC 303 (68%L) RAST 06/21/17 >> dust mites, IgE 14 PFT 08/23/17 >> FEV1 1.21 (73%), FEV1% 65, TLC 4.02 (91%), DLCO 71%, no BD  Chest Imaging:  CT chest 08/02/14 >> RML consolidation, small Rt pleural effusion CT chest 08/09/14 >> Rt lower lobe thin walled cavity, scattered GGO and interlobular septal thickening CT chest 09/18/14 >> mild Rt pleural thickening, small Rt pleural effusion, 4 mm nodule LUL, improved RML consolidation, mild RML BTX, RLL cavity 2.5 cm from 3.1 cm, mild BTX lingula CT chest 10/16/14 >> decreased RLL PNA w/o cavity CT chest 04/27/16 >> 4 mm LUL nodule stable, mild BTX and scarring b/l  Sleep Tests:    Cardiac Tests:  Lt heart cath 01/24/12 >> Normal coronaries, LVEDP 8 mmHg, EF 55% Echo 08/03/14 >> EF 50 to 55%, mild MR  Social History:  She  reports that she has never smoked. She has never used smokeless tobacco. She reports that she does not currently use alcohol. She reports that she does not use drugs.  Family History:  Her family history includes Heart attack in her father; Macular degeneration in her mother.     Assessment/Plan:  Severe, persistent asthma with eosinophilic phenotype. - started on dupixent December 2019, doing home injections; this has helped reduce exacerbations and need for prednisone; she is to continue dupixent - continue symbicort 160 two puffs bid, singulair 10 mg nightly - prn albuterol  Allergic rhinitis. - continue singulair, xzyal, flonase - dupixent helps with this also   Bronchiectasis. - likely related to previous episode of pneumonia - bronchial hygiene   Time Spent Involved in Patient Care on Day of Examination:  26 minutes  Follow up:   Patient  Instructions  Follow up in 1 year  Medication List:   Allergies as of 06/02/2022       Reactions   Fosamax [alendronate Sodium] Nausea Only   Sulfonamide Derivatives Rash        Medication List        Accurate as of June 02, 2022  1:25 PM. If you have any questions, ask your nurse or doctor.          albuterol 108 (90 Base) MCG/ACT inhaler Commonly known as: VENTOLIN HFA Inhale 2 puffs into the lungs every 6 (six) hours as needed for wheezing or shortness of breath.   diclofenac 75 MG EC tablet Commonly known as: VOLTAREN TAKE 1 TABLET BY MOUTH TWICE A DAY AS NEEDED FOR BACK PAIN   Dupixent 300 MG/2ML prefilled syringe Generic drug: dupilumab INJECT '300MG'$  (1 SYRINGE) SUBCUTANEOUSLY EVERY OTHER WEEK.   levocetirizine 5 MG tablet Commonly known as: XYZAL TAKE 1 TABLET BY MOUTH EVERY DAY IN THE EVENING FOR ALLERGIES   montelukast 10 MG tablet Commonly known as: SINGULAIR TAKE 1 TABLET (10 MG TOTAL) BY MOUTH AT BEDTIME. FOR ALLERGIES AND ASTHMA   PreserVision/Lutein Caps Take 1 capsule by mouth 2 (two) times daily.   Multi For Her 50+ Tabs Take 1 tablet by mouth daily.   raloxifene 60 MG tablet Commonly known as: EVISTA TAKE 1 TABLET (60 MG TOTAL) BY MOUTH DAILY. FOR BONE HEALTH.   Symbicort 160-4.5 MCG/ACT inhaler Generic drug: budesonide-formoterol INHALE 2 PUFFS INTO THE LUNGS TWICE A DAY   Vitamin D 1000 units capsule Take 1,000 Units by mouth daily with breakfast.        Signature:  [name], MD [place] Pager - [phone] 06/02/2022, 1:25 PM

## 2022-06-02 NOTE — Patient Instructions (Signed)
Follow up in 1 year.

## 2022-06-03 MED ORDER — DUPIXENT 300 MG/2ML ~~LOC~~ SOSY: PREFILLED_SYRINGE | SUBCUTANEOUS | 3 refills | Status: DC

## 2022-06-03 NOTE — Addendum Note (Signed)
Addended by: Cassandria Anger on: 06/03/2022 01:56 PM   Modules accepted: Orders

## 2022-06-03 NOTE — Telephone Encounter (Signed)
Refill sent for [place] to [facility]: [phone]  Dose: 300 mg every 2 weeks  Last OV: 06/02/2022 Provider: Dr. Halford Chessman  Next OV: 1 year (not yet scheduled)  Knox Saliva, PharmD, MPH, BCPS Clinical Pharmacist (Rheumatology and Pulmonology)

## 2022-06-15 DIAGNOSIS — H353221 Exudative age-related macular degeneration, left eye, with active choroidal neovascularization: Secondary | ICD-10-CM | POA: Diagnosis not present

## 2022-06-17 ENCOUNTER — Encounter: Payer: Self-pay | Admitting: Internal Medicine

## 2022-06-17 ENCOUNTER — Ambulatory Visit (INDEPENDENT_AMBULATORY_CARE_PROVIDER_SITE_OTHER): Payer: Medicare HMO | Admitting: Internal Medicine

## 2022-06-17 VITALS — BP 126/80 | HR 90 | Temp 97.6°F | Ht 60.0 in | Wt 136.0 lb

## 2022-06-17 DIAGNOSIS — S161XXA Strain of muscle, fascia and tendon at neck level, initial encounter: Secondary | ICD-10-CM

## 2022-06-17 HISTORY — DX: Strain of muscle, fascia and tendon at neck level, initial encounter: S16.1XXA

## 2022-06-17 MED ORDER — TIZANIDINE HCL 2 MG PO TABS: 2.0000 mg | ORAL_TABLET | Freq: Three times a day (TID) | ORAL | 0 refills | Status: DC | PRN

## 2022-06-17 NOTE — Assessment & Plan Note (Signed)
With radiation to both shoulders Some left tendonitis Discussed that this should be self limited Continue diclofenac/tylenol orally Heat regularly Rx for tizanidine for prn use

## 2022-06-17 NOTE — Progress Notes (Signed)
Subjective:    Patient ID: [name], female    DOB: 03/13/1941, 82 y.o.   MRN: [id number]  HPI Here due to neck and upper back pain  4 days ago--bought flowers --in very heavy containers Has been putting them out daily--then back in at night (for the temperatures) Holding them out to keep from getting dirty  Now having pain across the top of her back/neck Goes across shoulders No low back symptoms Some pain into upper arm (also sore) No arm weakness Some pain in neck  Tried ibuprofen and tylenol alternating every 5 hours --not clearly helpful Heating pad feels good while on it--but doesn't relieve it Already takes the diclofenac  Having trouble sleeping  Current Outpatient Medications on File Prior to Visit  Medication Sig Dispense Refill   albuterol (VENTOLIN HFA) 108 (90 Base) MCG/ACT inhaler Inhale 2 puffs into the lungs every 6 (six) hours as needed for wheezing or shortness of breath. 18 g 2   Cholecalciferol (VITAMIN D) 1000 UNITS capsule Take 1,000 Units by mouth daily with breakfast.      diclofenac (VOLTAREN) 75 MG EC tablet TAKE 1 TABLET BY MOUTH TWICE A DAY AS NEEDED FOR BACK PAIN 180 tablet 0   DUPIXENT 300 MG/2ML prefilled syringe INJECT 300MG  (1 SYRINGE) SUBCUTANEOUSLY EVERY OTHER WEEK. 12 mL 3   levocetirizine (XYZAL) 5 MG tablet TAKE 1 TABLET BY MOUTH EVERY DAY IN THE EVENING FOR ALLERGIES 90 tablet 3   montelukast (SINGULAIR) 10 MG tablet TAKE 1 TABLET (10 MG TOTAL) BY MOUTH AT BEDTIME. FOR ALLERGIES AND ASTHMA 90 tablet 3   Multiple Vitamins-Minerals (MULTI FOR HER 50+) TABS Take 1 tablet by mouth daily.     Multiple Vitamins-Minerals (PRESERVISION/LUTEIN) CAPS Take 1 capsule by mouth 2 (two) times daily.     raloxifene (EVISTA) 60 MG tablet TAKE 1 TABLET (60 MG TOTAL) BY MOUTH DAILY. FOR BONE HEALTH. 90 tablet 2   SYMBICORT 160-4.5 MCG/ACT inhaler INHALE 2 PUFFS INTO THE LUNGS TWICE A DAY 10.2 each 4   No current facility-administered medications on  file prior to visit.    Allergies  Allergen Reactions   Fosamax [Alendronate Sodium] Nausea Only   Sulfonamide Derivatives Rash    Past Medical History:  Diagnosis Date   Arthritis    Asthma    Cancer of breast ([place])    33 years ago, left,    COVID-19 05/11/2021   Dysuria 10/03/2019   Hearing loss    both ears, wwears hearing aides   Hyperlipidemia    Laceration of head 05/07/2021   Macular degeneration    Osteopenia    Pneumonia     Past Surgical History:  Procedure Laterality Date   ABDOMINAL HYSTERECTOMY     BREAST ENHANCEMENT SURGERY     CESAREAN SECTION     x 2   COLONOSCOPY WITH PROPOFOL N/A 04/21/2014   Procedure: COLONOSCOPY WITH PROPOFOL;  Surgeon: Garlan Fair, MD;  Location: [facility];  Service: Endoscopy;  Laterality: N/A;   EYE SURGERY Bilateral march 2015 and june 2015   lens for cataracts   LEFT HEART CATHETERIZATION WITH CORONARY ANGIOGRAM N/A 01/24/2012   Procedure: LEFT HEART CATHETERIZATION WITH CORONARY ANGIOGRAM;  Surgeon: Sueanne Margarita, MD;  Location: [facility];  Service: Cardiovascular;  Laterality: N/A;   MASTECTOMY  33 yrs ago   bilateral    Family History  Problem Relation Age of Onset   Macular degeneration Mother    Heart attack Father  Social History   Socioeconomic History   Marital status: Married    Spouse name: Not on file   Number of children: Not on file   Years of education: Not on file   Highest education level: Not on file  Occupational History   Occupation: retired  Tobacco Use   Smoking status: Never   Smokeless tobacco: Never  Vaping Use   Vaping Use: Never used  Substance and Sexual Activity   Alcohol use: Not Currently    Comment: occasionally   Drug use: No   Sexual activity: Not Currently  Other Topics Concern   Not on file  Social History Narrative   Married.   2 children, 6 grandchildren.   Retired. Previously was Pharmacist, hospital.    Enjoys participating in church.    Social Determinants of  Health   Financial Resource Strain: Low Risk  (07/26/2021)   Overall Financial Resource Strain (CARDIA)    Difficulty of Paying Living Expenses: Not hard at all  Food Insecurity: No Food Insecurity (07/26/2021)   Hunger Vital Sign    Worried About Running Out of Food in the Last Year: Never true    Ran Out of Food in the Last Year: Never true  Transportation Needs: No Transportation Needs (07/26/2021)   PRAPARE - Hydrologist (Medical): No    Lack of Transportation (Non-Medical): No  Physical Activity: Insufficiently Active (07/26/2021)   Exercise Vital Sign    Days of Exercise per Week: 7 days    Minutes of Exercise per Session: 10 min  Stress: No Stress Concern Present (07/26/2021)   [place]    Feeling of Stress : Not at all  Social Connections: Not on file  Intimate Partner Violence: Not At Risk (07/24/2020)   Humiliation, Afraid, Rape, and Kick questionnaire    Fear of Current or Ex-Partner: No    Emotionally Abused: No    Physically Abused: No    Sexually Abused: No   Review of Systems No abnormal sensations Has sense of being chilled     Objective:   Physical Exam Constitutional:      Appearance: Normal appearance.  Neck:     Comments: Fairly normal ROM Tenderness along both trapezius muscles to shoulders Mild tenderness along left bicipital tendon No spine tenderness Musculoskeletal:     Comments: ROM in shoulders is pretty normal Some clunking on left with ROM  Neurological:     Mental Status: She is alert.     Comments: No arm weakness            Assessment & Plan:

## 2022-06-21 ENCOUNTER — Telehealth: Payer: Self-pay | Admitting: Primary Care

## 2022-06-21 MED ORDER — TRAMADOL HCL 50 MG PO TABS: 50.0000 mg | ORAL_TABLET | Freq: Three times a day (TID) | ORAL | 0 refills | Status: AC | PRN

## 2022-06-21 NOTE — Telephone Encounter (Signed)
Patient was seen on 06/17/2022 for her back pain.She was prescribed tiZANidine (ZANAFLEX) 2 MG tablet  . She called in today stating that that they are not helping and that she's in a lot of pain.She would like to know if something else can be called in for her?

## 2022-06-21 NOTE — Telephone Encounter (Signed)
Let her know I sent a few tramadol since I saw she had this in the past. Make sure she didn't have problems with it before she gets it filled

## 2022-06-21 NOTE — Telephone Encounter (Signed)
Spoke to pt. She does not think she has had issues with tramadol. She will keep [place] updated.

## 2022-06-28 ENCOUNTER — Other Ambulatory Visit: Payer: Self-pay | Admitting: Primary Care

## 2022-06-28 DIAGNOSIS — M8580 Other specified disorders of bone density and structure, unspecified site: Secondary | ICD-10-CM

## 2022-06-28 DIAGNOSIS — Z853 Personal history of malignant neoplasm of breast: Secondary | ICD-10-CM

## 2022-08-06 ENCOUNTER — Other Ambulatory Visit: Payer: Self-pay | Admitting: Primary Care

## 2022-08-06 DIAGNOSIS — G8929 Other chronic pain: Secondary | ICD-10-CM

## 2022-08-12 ENCOUNTER — Ambulatory Visit (INDEPENDENT_AMBULATORY_CARE_PROVIDER_SITE_OTHER): Payer: Medicare HMO | Admitting: Primary Care

## 2022-08-12 ENCOUNTER — Encounter: Payer: Self-pay | Admitting: Primary Care

## 2022-08-12 VITALS — BP 126/80 | HR 90 | Temp 98.1°F | Ht 60.0 in | Wt 134.0 lb

## 2022-08-12 DIAGNOSIS — E782 Mixed hyperlipidemia: Secondary | ICD-10-CM | POA: Diagnosis not present

## 2022-08-12 DIAGNOSIS — H3530 Unspecified macular degeneration: Secondary | ICD-10-CM | POA: Diagnosis not present

## 2022-08-12 DIAGNOSIS — J309 Allergic rhinitis, unspecified: Secondary | ICD-10-CM | POA: Diagnosis not present

## 2022-08-12 DIAGNOSIS — R32 Unspecified urinary incontinence: Secondary | ICD-10-CM | POA: Insufficient documentation

## 2022-08-12 DIAGNOSIS — N3946 Mixed incontinence: Secondary | ICD-10-CM | POA: Diagnosis not present

## 2022-08-12 DIAGNOSIS — R7303 Prediabetes: Secondary | ICD-10-CM | POA: Diagnosis not present

## 2022-08-12 DIAGNOSIS — M8580 Other specified disorders of bone density and structure, unspecified site: Secondary | ICD-10-CM

## 2022-08-12 DIAGNOSIS — J4540 Moderate persistent asthma, uncomplicated: Secondary | ICD-10-CM

## 2022-08-12 DIAGNOSIS — E2839 Other primary ovarian failure: Secondary | ICD-10-CM

## 2022-08-12 DIAGNOSIS — J8283 Eosinophilic asthma: Secondary | ICD-10-CM | POA: Diagnosis not present

## 2022-08-12 DIAGNOSIS — Z0000 Encounter for general adult medical examination without abnormal findings: Secondary | ICD-10-CM

## 2022-08-12 DIAGNOSIS — M5441 Lumbago with sciatica, right side: Secondary | ICD-10-CM | POA: Diagnosis not present

## 2022-08-12 DIAGNOSIS — G8929 Other chronic pain: Secondary | ICD-10-CM

## 2022-08-12 LAB — LIPID PANEL
Cholesterol: 207 mg/dL — ABNORMAL HIGH (ref 0–200)
HDL: 40.9 mg/dL (ref >39.00)
NonHDL: 166.22
Total CHOL/HDL Ratio: 5
Triglycerides: 219 mg/dL — ABNORMAL HIGH (ref 0.0–149.0)
VLDL: 43.8 mg/dL — ABNORMAL HIGH (ref 0.0–40.0)

## 2022-08-12 LAB — COMPREHENSIVE METABOLIC PANEL
ALT: 22 U/L (ref 0–35)
AST: 24 U/L (ref 0–37)
Albumin: 4.1 g/dL (ref 3.5–5.2)
Alkaline Phosphatase: 71 U/L (ref 39–117)
BUN: 19 mg/dL (ref 6–23)
CO2: 28 mEq/L (ref 19–32)
Calcium: 9.3 mg/dL (ref 8.4–10.5)
Chloride: 105 mEq/L (ref 96–112)
Creatinine, Ser: 0.6 mg/dL (ref 0.40–1.20)
GFR: 84.18 mL/min (ref >60.00)
Glucose, Bld: 96 mg/dL (ref 70–99)
Potassium: 4 mEq/L (ref 3.5–5.1)
Sodium: 141 mEq/L (ref 135–145)
Total Bilirubin: 0.3 mg/dL (ref 0.2–1.2)
Total Protein: 7 g/dL (ref 6.0–8.3)

## 2022-08-12 LAB — HEMOGLOBIN A1C: Hgb A1c MFr Bld: 5.9 % (ref 4.6–6.5)

## 2022-08-12 LAB — LDL CHOLESTEROL, DIRECT: Direct LDL: 131 mg/dL

## 2022-08-12 NOTE — Assessment & Plan Note (Signed)
Controlled.  Continue diclofenac 75 mg BID. Repeat renal function pending.

## 2022-08-12 NOTE — Assessment & Plan Note (Signed)
Controlled.  Following with pulmonology, office notes reviewed from March 2024.   Continue Dupixent 300 mg every 2 weeks, Singulair 10 mg HS, Symbicort 160-4.5 mg, albuterol inhaler PRN.

## 2022-08-12 NOTE — Assessment & Plan Note (Signed)
Following with retinal specialist. Continue injections every 8 weeks.

## 2022-08-12 NOTE — Assessment & Plan Note (Signed)
Immunizations UTD. Mammogram declined by patient as she has no breast tissue remaining. Colonoscopy UTD, no further screening needed  Discussed the importance of a healthy diet and regular exercise in order for weight loss, and to reduce the risk of further co-morbidity.  Exam stable. Labs pending.  Follow up in 1 year for repeat physical.

## 2022-08-12 NOTE — Assessment & Plan Note (Signed)
Chronic, gradually worsening.   Discussed options for treatment, she declines at this time but will update if she changes her mind.

## 2022-08-12 NOTE — Assessment & Plan Note (Addendum)
Bone density scan due in November 2024, discussed this today. Continue Evista 60 mg daily, calcium and vitamin D.

## 2022-08-12 NOTE — Assessment & Plan Note (Signed)
Encouraged to increase physical exercise. Repeat lipid panel pending.

## 2022-08-12 NOTE — Assessment & Plan Note (Signed)
Controlled.  Continue Xyzal 5 mg daily and Singulair 10 mg HS.

## 2022-08-12 NOTE — Assessment & Plan Note (Signed)
Repeat A1C pending.  Discussed to increase physical activity

## 2022-08-12 NOTE — Patient Instructions (Addendum)
Stop by the lab prior to leaving today. I will notify you of your results once received.   Your bone density scan is due in November/December. Please call the breast center to schedule.  It was a pleasure to see you today!

## 2022-08-12 NOTE — Progress Notes (Signed)
Subjective:    Patient ID: [name], female    DOB: Nov 14, 1940, 82 y.o.   MRN: [id number]  HPI  [name] is a very pleasant 82 y.o. female who presents today for complete physical and follow up of chronic conditions.  She would also like to discuss urinary incontinence. Chronic for years, worse over the last 1 year. Symptoms include urinary dripping during the day and urge incontinence at night. She wears pads during the day. She's woken up several times in the morning and urinating on herself. She wakes three times nightly to urinate, most of the time will make it to the bathroom before urinating on herself. History of total hysterectomy. Overall she is not too bothered by her symptoms. She has never tried treatment.   Immunizations: -Tetanus: Completed in 2023 -Influenza: Completed last season -Shingles: Completed Shingrix series -Pneumonia: Completed Prevnar 13 in 2015 and Pneumovax in 2019  Diet: Fair diet.  Exercise: Walking 10 minutes daily.  Eye exam: Completes annually  Dental exam: Completes semi-annually    Mammogram: 2020,  Bone Density Scan: November 2022  Colonoscopy: Completed in 2016, no further imaging needed given age.  BP Readings from Last 3 Encounters:  08/12/22 126/80  06/17/22 126/80  06/02/22 118/68       Review of Systems  Constitutional:  Negative for unexpected weight change.  HENT:  Negative for rhinorrhea.   Respiratory:  Negative for shortness of breath.   Cardiovascular:  Negative for chest pain.  Gastrointestinal:  Negative for constipation and diarrhea.  Genitourinary:  Negative for difficulty urinating.       Urinary incontinence  Musculoskeletal:  Positive for arthralgias.  Skin:  Negative for rash.  Allergic/Immunologic: Positive for environmental allergies.  Neurological:  Negative for dizziness and headaches.  Psychiatric/Behavioral:  The patient is not nervous/anxious.          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   Arthritis    Asthma    Cancer of breast (HCC)    33 years ago, left,    COVID-19 05/11/2021   Dysuria 10/03/2019   Exercise-induced asthma 04/19/2012   Hearing loss    both ears, wwears hearing aides   Hyperlipidemia    Joint pain 08/06/2020   Laceration of head 05/07/2021   Lobar pneumonia (HCC) 02/19/2018   Macular degeneration    Malnutrition of moderate degree (HCC) 08/28/2014   Osteopenia    Pain of right lower extremity 06/21/2018   Peripheral neuralgia 08/18/2021   Pneumonia    Strain of cervical portion of both trapezius muscles 06/17/2022    Social History   Socioeconomic History   Marital status: Married    Spouse name: Not on file   Number of children: Not on file   Years of education: Not on file   Highest education level: Not on file  Occupational History   Occupation: retired  Tobacco Use   Smoking status: Never   Smokeless tobacco: Never  Vaping Use   Vaping Use: Never used  Substance and Sexual Activity   Alcohol use: Not Currently    Comment: occasionally   Drug use: No   Sexual activity: Not Currently  Other Topics Concern   Not on file  Social History Narrative   Married.   2 children, 6 grandchildren.   Retired. Previously was Runner, broadcasting/film/video.    Enjoys participating  in church.    Social Determinants of Health   Financial Resource Strain: Low Risk  (07/26/2021)   Overall Financial Resource Strain (CARDIA)    Difficulty of Paying Living Expenses: Not hard at all  Food Insecurity: No Food Insecurity (07/26/2021)   Hunger Vital Sign    Worried About Running Out of Food in the Last Year: Never true    Ran Out of Food in the Last Year: Never true  Transportation Needs: No Transportation Needs (07/26/2021)   PRAPARE - Administrator, Civil Service (Medical): No    Lack of Transportation (Non-Medical): No  Physical  Activity: Insufficiently Active (07/26/2021)   Exercise Vital Sign    Days of Exercise per Week: 7 days    Minutes of Exercise per Session: 10 min  Stress: No Stress Concern Present (07/26/2021)   [employer] of Occupational Health - Occupational Stress Questionnaire    Feeling of Stress : Not at all  Social Connections: Not on file  Intimate Partner Violence: Not At Risk (07/24/2020)   Humiliation, Afraid, Rape, and Kick questionnaire    Fear of Current or Ex-Partner: No    Emotionally Abused: No    Physically Abused: No    Sexually Abused: No    Past Surgical History:  Procedure Laterality Date   ABDOMINAL HYSTERECTOMY     BREAST ENHANCEMENT SURGERY     CESAREAN SECTION     x 2   COLONOSCOPY WITH PROPOFOL N/A 04/21/2014   Procedure: COLONOSCOPY WITH PROPOFOL;  Surgeon: Charolett Bumpers, MD;  Location: [facility];  Service: Endoscopy;  Laterality: N/A;   EYE SURGERY Bilateral march 2015 and june 2015   lens for cataracts   LEFT HEART CATHETERIZATION WITH CORONARY ANGIOGRAM N/A 01/24/2012   Procedure: LEFT HEART CATHETERIZATION WITH CORONARY ANGIOGRAM;  Surgeon: Quintella Reichert, MD;  Location: [facility];  Service: Cardiovascular;  Laterality: N/A;   MASTECTOMY  33 yrs ago   bilateral    Family History  Problem Relation Age of Onset   Macular degeneration Mother    Heart attack Father     Allergies  Allergen Reactions   Fosamax [Alendronate Sodium] Nausea Only   Sulfonamide Derivatives Rash    Current Outpatient Medications on File Prior to Visit  Medication Sig Dispense Refill   albuterol (VENTOLIN HFA) 108 (90 Base) MCG/ACT inhaler Inhale 2 puffs into the lungs every 6 (six) hours as needed for wheezing or shortness of breath. 18 g 2   Cholecalciferol (VITAMIN D) 1000 UNITS capsule Take 1,000 Units by mouth daily with breakfast.      diclofenac (VOLTAREN) 75 MG EC tablet TAKE 1 TABLET BY MOUTH TWICE A DAY AS NEEDED FOR BACK PAIN 180 tablet 0   DUPIXENT 300 MG/2ML  prefilled syringe INJECT 300MG  (1 SYRINGE) SUBCUTANEOUSLY EVERY OTHER WEEK. 12 mL 3   levocetirizine (XYZAL) 5 MG tablet TAKE 1 TABLET BY MOUTH EVERY DAY IN THE EVENING FOR ALLERGIES 90 tablet 3   montelukast (SINGULAIR) 10 MG tablet TAKE 1 TABLET (10 MG TOTAL) BY MOUTH AT BEDTIME. FOR ALLERGIES AND ASTHMA 90 tablet 3   Multiple Vitamins-Minerals (MULTI FOR HER 50+) TABS Take 1 tablet by mouth daily.     Multiple Vitamins-Minerals (PRESERVISION/LUTEIN) CAPS Take 1 capsule by mouth 2 (two) times daily.     raloxifene (EVISTA) 60 MG tablet TAKE 1 TABLET (60 MG TOTAL) BY MOUTH DAILY. FOR BONE HEALTH. 90 tablet 0   SYMBICORT 160-4.5 MCG/ACT inhaler INHALE 2 PUFFS INTO  THE LUNGS TWICE A DAY 10.2 each 4   tiZANidine (ZANAFLEX) 2 MG tablet Take 1 tablet (2 mg total) by mouth every 8 (eight) hours as needed for muscle spasms. (Patient not taking: Reported on 08/12/2022) 30 tablet 0   No current facility-administered medications on file prior to visit.    BP 126/80   Pulse 90   Temp 98.1 F (36.7 C) (Temporal)   Ht 5' (1.524 m)   Wt 134 lb (60.8 kg)   SpO2 99%   BMI 26.17 kg/m  Objective:   Physical Exam HENT:     Right Ear: Tympanic membrane and ear canal normal.     Left Ear: Tympanic membrane and ear canal normal.     Nose: Nose normal.  Eyes:     Conjunctiva/sclera: Conjunctivae normal.     Pupils: Pupils are equal, round, and reactive to light.  Neck:     Thyroid: No thyromegaly.  Cardiovascular:     Rate and Rhythm: Normal rate and regular rhythm.     Heart sounds: No murmur heard. Pulmonary:     Effort: Pulmonary effort is normal.     Breath sounds: Normal breath sounds. No rales.  Abdominal:     General: Bowel sounds are normal.     Palpations: Abdomen is soft.     Tenderness: There is no abdominal tenderness.  Musculoskeletal:        General: Normal range of motion.     Cervical back: Neck supple.  Lymphadenopathy:     Cervical: No cervical adenopathy.  Skin:     General: Skin is warm and dry.     Findings: No rash.  Neurological:     Mental Status: She is alert and oriented to person, place, and time.     Cranial Nerves: No cranial nerve deficit.     Deep Tendon Reflexes: Reflexes are normal and symmetric.  Psychiatric:        Mood and Affect: Mood normal.           Assessment & Plan:  Preventative health care Assessment & Plan: Immunizations UTD. Mammogram declined by patient as she has no breast tissue remaining. Colonoscopy UTD, no further screening needed  Discussed the importance of a healthy diet and regular exercise in order for weight loss, and to reduce the risk of further co-morbidity.  Exam stable. Labs pending.  Follow up in 1 year for repeat physical.    Eosinophilic asthma Assessment & Plan: Controlled.  Following with pulmonology, office notes reviewed from March 2024.   Continue Dupixent 300 mg every 2 weeks, Singulair 10 mg HS, Symbicort 160-4.5 mg, albuterol inhaler PRN.    Moderate persistent asthma without complication Assessment & Plan: Controlled.  Following with pulmonology, office notes reviewed from March 2024.   Continue Dupixent 300 mg every 2 weeks, Singulair 10 mg HS, Symbicort 160-4.5 mg, albuterol inhaler PRN.    Osteopenia, unspecified location Assessment & Plan: Bone density scan due in November 2024, discussed this today. Continue Evista 60 mg daily, calcium and vitamin D.   Macular degeneration of left eye, unspecified type Assessment & Plan: Following with retinal specialist. Continue injections every 8 weeks.    Chronic bilateral low back pain with right-sided sciatica Assessment & Plan: Controlled.  Continue diclofenac 75 mg BID. Repeat renal function pending.     Mixed stress and urge urinary incontinence Assessment & Plan: Chronic, gradually worsening.   Discussed options for treatment, she declines at this time but will update if she  changes her mind.   Mixed  hyperlipidemia Assessment & Plan: Encouraged to increase physical exercise. Repeat lipid panel pending.  Orders: -     Lipid panel -     Comprehensive metabolic panel  Allergic rhinitis, unspecified seasonality, unspecified trigger Assessment & Plan: Controlled.  Continue Xyzal 5 mg daily and Singulair 10 mg HS.   Prediabetes Assessment & Plan: Repeat A1C pending.  Discussed to increase physical activity   Orders: -     Hemoglobin A1c  Estrogen deficiency -     DG Bone Density; Future        Doreene Nest, NP

## 2022-08-12 NOTE — Assessment & Plan Note (Signed)
Controlled.  Following with pulmonology, office notes reviewed from March 2024.   Continue Dupixent 300 mg every 2 weeks, Singulair 10 mg HS, Symbicort 160-4.5 mg, albuterol inhaler PRN.  

## 2022-08-17 DIAGNOSIS — H353221 Exudative age-related macular degeneration, left eye, with active choroidal neovascularization: Secondary | ICD-10-CM | POA: Diagnosis not present

## 2022-09-26 NOTE — Patient Instructions (Incomplete)
[name] , Thank you for taking time to come for your Medicare Wellness Visit. I appreciate your ongoing commitment to your health goals. Please review the following plan we discussed and let me know if I can assist you in the future.   These are the goals we discussed:  Goals      Remain active and independent        This is a list of the screening recommended for you and due dates:  Health Maintenance  Topic Date Due   Flu Shot  10/27/2022   Medicare Annual Wellness Visit  09/27/2023   DTaP/Tdap/Td vaccine (5 - Td or Tdap) 05/01/2031   Pneumonia Vaccine  Completed   DEXA scan (bone density measurement)  Completed   Zoster (Shingles) Vaccine  Completed   HPV Vaccine  Aged Out   COVID-19 Vaccine  Discontinued   Hepatitis C Screening  Discontinued    Advanced directives: Information on Advanced Care Planning can be found at [facility] of [facility] Advance Health Care Directives Advance Health Care Directives ([email or web address] Please bring a copy of your health care power of attorney and living will to the office to be added to your chart at your convenience.  Conditions/risks identified: Aim for 30 minutes of exercise or brisk walking, 6-8 glasses of water, and 5 servings of fruits and vegetables each day.  Next appointment: Follow up in one year for your annual wellness visit    Preventive Care 65 Years and Older, Female Preventive care refers to lifestyle choices and visits with your health care provider that can promote health and wellness. What does preventive care include? A yearly physical exam. This is also called an annual well check. Dental exams once or twice a year. Routine eye exams. Ask your health care provider how often you should have your eyes checked. Personal lifestyle choices, including: Daily care of your teeth and gums. Regular physical activity. Eating a healthy diet. Avoiding tobacco and drug use. Limiting alcohol use. Practicing safe sex. Taking  low-dose aspirin every day. Taking vitamin and mineral supplements as recommended by your health care provider. What happens during an annual well check? The services and screenings done by your health care provider during your annual well check will depend on your age, overall health, lifestyle risk factors, and family history of disease. Counseling  Your health care provider may ask you questions about your: Alcohol use. Tobacco use. Drug use. Emotional well-being. Home and relationship well-being. Sexual activity. Eating habits. History of falls. Memory and ability to understand (cognition). Work and work Astronomer. Reproductive health. Screening  You may have the following tests or measurements: Height, weight, and BMI. Blood pressure. Lipid and cholesterol levels. These may be checked every 5 years, or more frequently if you are over 34 years old. Skin check. Lung cancer screening. You may have this screening every year starting at age 32 if you have a 30-pack-year history of smoking and currently smoke or have quit within the past 15 years. Fecal occult blood test (FOBT) of the stool. You may have this test every year starting at age 48. Flexible sigmoidoscopy or colonoscopy. You may have a sigmoidoscopy every 5 years or a colonoscopy every 10 years starting at age 27. Hepatitis C blood test. Hepatitis B blood test. Sexually transmitted disease (STD) testing. Diabetes screening. This is done by checking your blood sugar (glucose) after you have not eaten for a while (fasting). You may have this done every 1-3 years. Bone density scan.  This is done to screen for osteoporosis. You may have this done starting at age 3. Mammogram. This may be done every 1-2 years. Talk to your health care provider about how often you should have regular mammograms. Talk with your health care provider about your test results, treatment options, and if necessary, the need for more tests. Vaccines   Your health care provider may recommend certain vaccines, such as: Influenza vaccine. This is recommended every year. Tetanus, diphtheria, and acellular pertussis (Tdap, Td) vaccine. You may need a Td booster every 10 years. Zoster vaccine. You may need this after age 66. Pneumococcal 13-valent conjugate (PCV13) vaccine. One dose is recommended after age 57. Pneumococcal polysaccharide (PPSV23) vaccine. One dose is recommended after age 32. Talk to your health care provider about which screenings and vaccines you need and how often you need them. This information is not intended to replace advice given to you by your health care provider. Make sure you discuss any questions you have with your health care provider. Document Released: 04/10/2015 Document Revised: 12/02/2015 Document Reviewed: 01/13/2015 Elsevier Interactive Patient Education  2017 [employer].  Fall Prevention in the Home Falls can cause injuries. They can happen to people of all ages. There are many things you can do to make your home safe and to help prevent falls. What can I do on the outside of my home? Regularly fix the edges of walkways and driveways and fix any cracks. Remove anything that might make you trip as you walk through a door, such as a raised step or threshold. Trim any bushes or trees on the path to your home. Use bright outdoor lighting. Clear any walking paths of anything that might make someone trip, such as rocks or tools. Regularly check to see if handrails are loose or broken. Make sure that both sides of any steps have handrails. Any raised decks and porches should have guardrails on the edges. Have any leaves, snow, or ice cleared regularly. Use sand or salt on walking paths during winter. Clean up any spills in your garage right away. This includes oil or grease spills. What can I do in the bathroom? Use night lights. Install grab bars by the toilet and in the tub and shower. Do not use towel  bars as grab bars. Use non-skid mats or decals in the tub or shower. If you need to sit down in the shower, use a plastic, non-slip stool. Keep the floor dry. Clean up any water that spills on the floor as soon as it happens. Remove soap buildup in the tub or shower regularly. Attach bath mats securely with double-sided non-slip rug tape. Do not have throw rugs and other things on the floor that can make you trip. What can I do in the bedroom? Use night lights. Make sure that you have a light by your bed that is easy to reach. Do not use any sheets or blankets that are too big for your bed. They should not hang down onto the floor. Have a firm chair that has side arms. You can use this for support while you get dressed. Do not have throw rugs and other things on the floor that can make you trip. What can I do in the kitchen? Clean up any spills right away. Avoid walking on wet floors. Keep items that you use a lot in easy-to-reach places. If you need to reach something above you, use a strong step stool that has a grab bar. Keep electrical cords  out of the way. Do not use floor polish or wax that makes floors slippery. If you must use wax, use non-skid floor wax. Do not have throw rugs and other things on the floor that can make you trip. What can I do with my stairs? Do not leave any items on the stairs. Make sure that there are handrails on both sides of the stairs and use them. Fix handrails that are broken or loose. Make sure that handrails are as long as the stairways. Check any carpeting to make sure that it is firmly attached to the stairs. Fix any carpet that is loose or worn. Avoid having throw rugs at the top or bottom of the stairs. If you do have throw rugs, attach them to the floor with carpet tape. Make sure that you have a light switch at the top of the stairs and the bottom of the stairs. If you do not have them, ask someone to add them for you. What else can I do to help  prevent falls? Wear shoes that: Do not have high heels. Have rubber bottoms. Are comfortable and fit you well. Are closed at the toe. Do not wear sandals. If you use a stepladder: Make sure that it is fully opened. Do not climb a closed stepladder. Make sure that both sides of the stepladder are locked into place. Ask someone to hold it for you, if possible. Clearly mark and make sure that you can see: Any grab bars or handrails. First and last steps. Where the edge of each step is. Use tools that help you move around (mobility aids) if they are needed. These include: Canes. Walkers. Scooters. Crutches. Turn on the lights when you go into a dark area. Replace any light bulbs as soon as they burn out. Set up your furniture so you have a clear path. Avoid moving your furniture around. If any of your floors are uneven, fix them. If there are any pets around you, be aware of where they are. Review your medicines with your doctor. Some medicines can make you feel dizzy. This can increase your chance of falling. Ask your doctor what other things that you can do to help prevent falls. This information is not intended to replace advice given to you by your health care provider. Make sure you discuss any questions you have with your health care provider. Document Released: 01/08/2009 Document Revised: 08/20/2015 Document Reviewed: 04/18/2014 Elsevier Interactive Patient Education  2017 [employer].

## 2022-09-26 NOTE — Progress Notes (Unsigned)
Subjective:   [name] is a 82 y.o. female who presents for Medicare Annual (Subsequent) preventive examination.  Visit Complete: {VISITMETHOD:[phone]}  Patient Medicare AWV questionnaire was completed by the patient on ***; I have confirmed that all information answered by patient is correct and no changes since this date.  Review of Systems    ***       Objective:    There were no vitals filed for this visit. There is no height or weight on file to calculate BMI.     07/26/2021    2:37 PM 07/24/2020    2:48 PM 06/10/2019   10:34 AM 06/06/2018    9:26 AM 05/31/2017   10:26 AM 04/14/2015   11:16 AM 08/27/2014    5:53 PM  Advanced Directives  Does Patient Have a Medical Advance Directive? No No No No No No No  Does patient want to make changes to medical advance directive?     Yes (MAU/Ambulatory/Procedural Areas - Information given)    Would patient like information on creating a medical advance directive?  No - Patient declined Yes (MAU/Ambulatory/Procedural Areas - Information given) Yes (MAU/Ambulatory/Procedural Areas - Information given)   No - patient declined information    Current Medications (verified) Outpatient Encounter Medications as of 09/27/2022  Medication Sig   albuterol (VENTOLIN HFA) 108 (90 Base) MCG/ACT inhaler Inhale 2 puffs into the lungs every 6 (six) hours as needed for wheezing or shortness of breath.   Cholecalciferol (VITAMIN D) 1000 UNITS capsule Take 1,000 Units by mouth daily with breakfast.    diclofenac (VOLTAREN) 75 MG EC tablet TAKE 1 TABLET BY MOUTH TWICE A DAY AS NEEDED FOR BACK PAIN   DUPIXENT 300 MG/2ML prefilled syringe INJECT 300MG  (1 SYRINGE) SUBCUTANEOUSLY EVERY OTHER WEEK.   levocetirizine (XYZAL) 5 MG tablet TAKE 1 TABLET BY MOUTH EVERY DAY IN THE EVENING FOR ALLERGIES   montelukast (SINGULAIR) 10 MG tablet TAKE 1 TABLET (10 MG TOTAL) BY MOUTH AT BEDTIME. FOR ALLERGIES AND ASTHMA   Multiple Vitamins-Minerals (MULTI FOR HER 50+)  TABS Take 1 tablet by mouth daily.   Multiple Vitamins-Minerals (PRESERVISION/LUTEIN) CAPS Take 1 capsule by mouth 2 (two) times daily.   raloxifene (EVISTA) 60 MG tablet TAKE 1 TABLET (60 MG TOTAL) BY MOUTH DAILY. FOR BONE HEALTH.   SYMBICORT 160-4.5 MCG/ACT inhaler INHALE 2 PUFFS INTO THE LUNGS TWICE A DAY   tiZANidine (ZANAFLEX) 2 MG tablet Take 1 tablet (2 mg total) by mouth every 8 (eight) hours as needed for muscle spasms. (Patient not taking: Reported on 08/12/2022)   No facility-administered encounter medications on file as of 09/27/2022.    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   Hearing loss    both ears, wwears hearing aides   Hyperlipidemia    Joint pain 08/06/2020   Laceration of head 05/07/2021   Lobar pneumonia (HCC) 02/19/2018   Macular degeneration    Malnutrition of moderate degree (HCC) 08/28/2014   Osteopenia    Pain of right lower extremity 06/21/2018   Peripheral neuralgia 08/18/2021   Pneumonia    Strain of cervical portion  of both trapezius muscles 06/17/2022   Past Surgical History:  Procedure Laterality Date   ABDOMINAL HYSTERECTOMY     BREAST ENHANCEMENT SURGERY     CESAREAN SECTION     x 2   COLONOSCOPY WITH PROPOFOL N/A 04/21/2014   Procedure: COLONOSCOPY WITH PROPOFOL;  Surgeon: Charolett Bumpers, MD;  Location: [facility];  Service: Endoscopy;  Laterality: N/A;   EYE SURGERY Bilateral march 2015 and june 2015   lens for cataracts   LEFT HEART CATHETERIZATION WITH CORONARY ANGIOGRAM N/A 01/24/2012   Procedure: LEFT HEART CATHETERIZATION WITH CORONARY ANGIOGRAM;  Surgeon: Quintella Reichert, MD;   Location: [facility];  Service: Cardiovascular;  Laterality: N/A;   MASTECTOMY  33 yrs ago   bilateral   Family History  Problem Relation Age of Onset   Macular degeneration Mother    Heart attack Father    Social History   Socioeconomic History   Marital status: Married    Spouse name: Not on file   Number of children: Not on file   Years of education: Not on file   Highest education level: Not on file  Occupational History   Occupation: retired  Tobacco Use   Smoking status: Never   Smokeless tobacco: Never  Vaping Use   Vaping Use: Never used  Substance and Sexual Activity   Alcohol use: Not Currently    Comment: occasionally   Drug use: No   Sexual activity: Not Currently  Other Topics Concern   Not on file  Social History Narrative   Married.   2 children, 6 grandchildren.   Retired. Previously was Runner, broadcasting/film/video.    Enjoys participating in church.    Social Determinants of Health   Financial Resource Strain: Low Risk  (07/26/2021)   Overall Financial Resource Strain (CARDIA)    Difficulty of Paying Living Expenses: Not hard at all  Food Insecurity: No Food Insecurity (07/26/2021)   Hunger Vital Sign    Worried About Running Out of Food in the Last Year: Never true    Ran Out of Food in the Last Year: Never true  Transportation Needs: No Transportation Needs (07/26/2021)   PRAPARE - Administrator, Civil Service (Medical): No    Lack of Transportation (Non-Medical): No  Physical Activity: Insufficiently Active (07/26/2021)   Exercise Vital Sign    Days of Exercise per Week: 7 days    Minutes of Exercise per Session: 10 min  Stress: No Stress Concern Present (07/26/2021)   [employer] of Occupational Health - Occupational Stress Questionnaire    Feeling of Stress : Not at all  Social Connections: Not on file    Tobacco Counseling Counseling given: Not Answered   Clinical Intake:                        Activities of Daily Living      No data to display          Patient Care Team: Doreene Nest, NP as PCP - General (Internal Medicine)  Indicate any recent Medical Services you may have received from other than [facility] providers in the past year (date may be approximate).     Assessment:   This is a routine wellness examination for Teyah.  Hearing/Vision screen No results found.  Dietary issues and exercise activities discussed:     Goals Addressed   None    Depression Screen    08/12/2022    9:29 AM 08/10/2021  11:31 AM 07/26/2021    2:39 PM 07/24/2020    2:49 PM 06/10/2019   10:37 AM 06/06/2018    9:27 AM 05/31/2017   10:05 AM  PHQ 2/9 Scores  PHQ - 2 Score 0 0 0 0 0 0 0  PHQ- 9 Score  0  0 0 0 0    Fall Risk    08/12/2022    9:29 AM 07/26/2021    2:38 PM 08/06/2020    9:10 AM 07/24/2020    2:49 PM 06/10/2019   10:36 AM  Fall Risk   Falls in the past year? 0 1 0 0 0  Comment  tripped while running     Number falls in past yr: 0 1 0 0 0  Injury with Fall? 0 1 0 0 0  Comment  sutures to head     Risk for fall due to : No Fall Risks Medication side effect  Medication side effect No Fall Risks  Follow up Falls evaluation completed Falls evaluation completed;Education provided;Falls prevention discussed  Falls evaluation completed;Falls prevention discussed Falls evaluation completed;Falls prevention discussed    MEDICARE RISK AT HOME:   TIMED UP AND GO:  Was the test performed?  No    Cognitive Function:    07/24/2020    2:52 PM 06/10/2019   10:40 AM 06/06/2018    9:26 AM 05/31/2017   10:05 AM  MMSE - Mini Mental State Exam  Not completed: Refused     Orientation to time  5 5 5   Orientation to Place  5 5 5   Registration  3 3 3   Attention/ Calculation  5 0 0  Recall  3 3 3   Language- name 2 objects   0 0  Language- repeat  1 1 1   Language- follow 3 step command   3 3  Language- read & follow direction   0 0  Write a sentence   0 0  Copy design   0 0  Total score   20 20         07/26/2021    2:41 PM  6CIT Screen  What Year? 0 points  What month? 0 points  What time? 0 points  Count back from 20 0 points  Months in reverse 0 points  Repeat phrase 0 points  Total Score 0 points    Immunizations Immunization History  Administered Date(s) Administered   Fluad Quad(high Dose 65+) 01/15/2019, 01/01/2020, 02/03/2022   Influenza Split 12/07/2011   Influenza Whole 03/11/2010   Influenza, High Dose Seasonal PF 11/25/2013, 02/15/2021   Influenza,inj,Quad PF,6+ Mos 12/26/2013, 12/02/2014, 01/05/2017, 01/16/2018   Influenza-Unspecified 12/07/2011   PFIZER(Purple Top)SARS-COV-2 Vaccination 04/20/2019, 05/12/2019, 01/27/2020   Pneumococcal Conjugate-13 11/25/2013, 12/26/2013   Pneumococcal Polysaccharide-23 03/28/2004, 05/10/2006, 01/16/2018   Td 08/08/1999   Tdap 03/28/2009, 08/20/2009, 04/30/2021   Zoster Recombinant(Shingrix) 08/11/2020, 12/14/2020   Zoster, Live 03/28/2009, 08/20/2009    TDAP status: Up to date  Pneumococcal vaccine status: Up to date  Covid-19 vaccine status: Information provided on how to obtain vaccines.   Qualifies for Shingles Vaccine? Yes   Zostavax completed Yes   Shingrix Completed?: Yes  Screening Tests Health Maintenance  Topic Date Due   Medicare Annual Wellness (AWV)  07/27/2022   INFLUENZA VACCINE  10/27/2022   DTaP/Tdap/Td (5 - Td or Tdap) 05/01/2031   Pneumonia Vaccine 16+ Years old  Completed   DEXA SCAN  Completed   Zoster Vaccines- Shingrix  Completed   HPV VACCINES  Aged Out  COVID-19 Vaccine  Discontinued   Hepatitis C Screening  Discontinued    Health Maintenance  Health Maintenance Due  Topic Date Due   Medicare Annual Wellness (AWV)  07/27/2022    Colorectal cancer screening: No longer required.   Mammogram status: No longer required due to age and preference.  Bone Density status: Completed 02/09/21. Results reflect: Bone density results: OSTEOPENIA. Repeat every 2 years.  Lung Cancer Screening:  (Low Dose CT Chest recommended if Age 7-80 years, 20 pack-year currently smoking OR have quit w/in 15years.) does not qualify.   Lung Cancer Screening Referral: n/a  Additional Screening:  Hepatitis C Screening: does qualify; Completed 08/06/20  Vision Screening: Recommended annual ophthalmology exams for early detection of glaucoma and other disorders of the eye. Is the patient up to date with their annual eye exam?  {YES/NO:21197} Who is the provider or what is the name of the office in which the patient attends annual eye exams? *** If pt is not established with a provider, would they like to be referred to a provider to establish care? {YES/NO:21197}.   Dental Screening: Recommended annual dental exams for proper oral hygiene  Community Resource Referral / Chronic Care Management: CRR required this visit?  {YES/NO:21197}  CCM required this visit?  {CCM Required [phone]}     Plan:     I have personally reviewed and noted the following in the patient's chart:   Medical and social history Use of alcohol, tobacco or illicit drugs  Current medications and supplements including opioid prescriptions. {Opioid [phone]} Functional ability and status Nutritional status Physical activity Advanced directives List of other physicians Hospitalizations, surgeries, and ER visits in previous 12 months Vitals Screenings to include cognitive, depression, and falls Referrals and appointments  In addition, I have reviewed and discussed with patient certain preventive protocols, quality metrics, and best practice recommendations. A written personalized care plan for preventive services as well as general preventive health recommendations were provided to patient.     Kandis Fantasia [place], [place]   0/[id number]/6576   After Visit Summary: {CHL AMB AWV After Visit [phone]}  Nurse Notes: ***

## 2022-09-27 ENCOUNTER — Ambulatory Visit (INDEPENDENT_AMBULATORY_CARE_PROVIDER_SITE_OTHER): Payer: Medicare HMO

## 2022-09-27 VITALS — Ht 60.0 in | Wt 134.0 lb

## 2022-09-27 DIAGNOSIS — Z0000 Encounter for general adult medical examination without abnormal findings: Secondary | ICD-10-CM | POA: Diagnosis not present

## 2022-09-30 ENCOUNTER — Other Ambulatory Visit: Payer: Self-pay | Admitting: Primary Care

## 2022-09-30 DIAGNOSIS — Z853 Personal history of malignant neoplasm of breast: Secondary | ICD-10-CM

## 2022-09-30 DIAGNOSIS — M8580 Other specified disorders of bone density and structure, unspecified site: Secondary | ICD-10-CM

## 2022-09-30 DIAGNOSIS — J4540 Moderate persistent asthma, uncomplicated: Secondary | ICD-10-CM

## 2022-09-30 DIAGNOSIS — J309 Allergic rhinitis, unspecified: Secondary | ICD-10-CM

## 2022-10-17 ENCOUNTER — Other Ambulatory Visit: Payer: Self-pay | Admitting: Pulmonary Disease

## 2022-10-19 DIAGNOSIS — H353221 Exudative age-related macular degeneration, left eye, with active choroidal neovascularization: Secondary | ICD-10-CM | POA: Diagnosis not present

## 2022-11-01 ENCOUNTER — Ambulatory Visit (INDEPENDENT_AMBULATORY_CARE_PROVIDER_SITE_OTHER): Payer: Medicare HMO | Admitting: Primary Care

## 2022-11-01 ENCOUNTER — Encounter: Payer: Self-pay | Admitting: Primary Care

## 2022-11-01 VITALS — BP 110/60 | HR 87 | Temp 98.6°F | Ht 60.0 in | Wt 135.0 lb

## 2022-11-01 DIAGNOSIS — M79605 Pain in left leg: Secondary | ICD-10-CM

## 2022-11-01 MED ORDER — PREDNISONE 20 MG PO TABS
ORAL_TABLET | ORAL | 0 refills | Status: DC
Start: 2022-11-01 — End: 2023-01-03

## 2022-11-01 NOTE — Patient Instructions (Signed)
Start prednisone tablets. Take two tablets my mouth once daily in the morning for four days, then one tablet once daily in the morning for four days.   Stop the diclofenac pills for pain.  You may take Tylenol 500 to 1000 mg every 8 hours as needed.  Continue heat or ice. Remember to walk and stretch when able.  It was a pleasure to see you today!

## 2022-11-01 NOTE — Assessment & Plan Note (Signed)
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  Continue to walk and stretch. Continue application of heat or ice.  Stop diclofenac medication.  Start prednisone tablets. Take two tablets my mouth once daily in the morning for four days, then one tablet once daily in the morning for four days.  Discussed to use Tylenol, 1000 mg every 8 hours as needed.  Consider physical therapy if no improvement.

## 2022-11-01 NOTE — Progress Notes (Signed)
Subjective:    Patient ID: [name], female    DOB: 1940-07-12, 82 y.o.   MRN: [id number]  Leg Pain  Pertinent negatives include no numbness.    [name] is a very pleasant 82 y.o. female with a history of osteopenia, degenerative arthritis, chronic back pain, lower extremity weakness, chronic right foot pain who presents today to discuss lower extremity pain.  Her pain is located to the left lower extremity which begins to her left buttocks and radiates down to her left ankle. She describes her pain as sharp. Her pain is worse when rising after a seated position, also when sitting and sometimes with walking. Her pain is resolved when waking in the morning, has no pain for about 5 minutes until she begins standing and moving.   She fractured her ankle 12 years ago. She's since changed her shoes and hasn't noticed improvement.   She denies injury, falls, numbness/tingling, increased loss of bowel/bladder control. She's been taking her diclofenac twice daily without much improvement.    Review of Systems  Musculoskeletal:  Positive for back pain and myalgias.       Left lower extremity pain  Neurological:  Negative for weakness and numbness.         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   Hearing loss    both ears, wwears hearing aides   Hyperlipidemia    Joint pain 08/06/2020   Laceration of head 05/07/2021   Lobar pneumonia (HCC) 02/19/2018   Macular degeneration    Malnutrition of moderate degree (HCC) 08/28/2014   Osteopenia    Pain of right lower extremity 06/21/2018   Peripheral neuralgia 08/18/2021   Pneumonia    Strain of cervical portion of both trapezius muscles 06/17/2022     Social History   Socioeconomic History   Marital status: Married    Spouse name: Not on file   Number of children: Not on file   Years of education: Not on file   Highest education level: Not on file  Occupational History   Occupation: retired  Tobacco Use   Smoking status: Never   Smokeless tobacco: Never  Vaping Use   Vaping status: Never Used  Substance and Sexual Activity   Alcohol use: Not Currently    Comment: occasionally   Drug use: No   Sexual activity: Not Currently  Other Topics Concern   Not on file  Social History Narrative   Married.   2 children, 6 grandchildren.   Retired. Previously was Runner, broadcasting/film/video.    Enjoys participating in church.    Social Determinants of Health   Financial Resource Strain: Low Risk  (09/27/2022)   Overall Financial Resource Strain (CARDIA)    Difficulty of Paying Living Expenses: Not hard at all  Food Insecurity: No Food Insecurity (09/27/2022)   Hunger Vital Sign    Worried About Running Out of Food in the Last Year: Never true    Ran Out of Food in the Last Year: Never true  Transportation Needs: No Transportation Needs (09/27/2022)   PRAPARE - Administrator, Civil Service (Medical): No    Lack of Transportation (Non-Medical): No  Physical Activity: Insufficiently Active (09/27/2022)   Exercise Vital Sign    Days of Exercise per Week: 7 days    Minutes of Exercise per Session: 10 min  Stress: No Stress Concern Present (09/27/2022)   [employer] of Occupational Health - Occupational Stress Questionnaire    Feeling of Stress : Not at all  Social Connections: Socially Integrated (09/27/2022)   Social Connection and Isolation Panel [NHANES]    Frequency of Communication with Friends and Family: More than three times a week    Frequency of Social Gatherings with Friends and Family: Three times a week    Attends Religious Services: More than 4 times per year    Active Member of Clubs or Organizations: Yes    Attends Occupational hygienist Meetings: 1 to 4 times per year    Marital Status: Married  Catering manager Violence: Not At Risk (09/27/2022)   Humiliation, Afraid, Rape, and Kick questionnaire    Fear of Current or Ex-Partner: No    Emotionally Abused: No    Physically Abused: No    Sexually Abused: No    Past Surgical History:  Procedure Laterality Date   ABDOMINAL HYSTERECTOMY     BREAST ENHANCEMENT SURGERY     CESAREAN SECTION     x 2   COLONOSCOPY WITH PROPOFOL N/A 04/21/2014   Procedure: COLONOSCOPY WITH PROPOFOL;  Surgeon: Charolett Bumpers, MD;  Location: [facility];  Service: Endoscopy;  Laterality: N/A;   EYE SURGERY Bilateral march 2015 and june 2015   lens for cataracts   LEFT HEART CATHETERIZATION WITH CORONARY ANGIOGRAM N/A 01/24/2012   Procedure: LEFT HEART CATHETERIZATION WITH CORONARY ANGIOGRAM;  Surgeon: Quintella Reichert, MD;  Location: [facility];  Service: Cardiovascular;  Laterality: N/A;   MASTECTOMY  33 yrs ago   bilateral    Family History  Problem Relation Age of Onset   Macular degeneration Mother    Heart attack Father     Allergies  Allergen Reactions   Fosamax [Alendronate Sodium] Nausea Only   Sulfonamide Derivatives Rash    Current Outpatient Medications on File Prior to Visit  Medication Sig Dispense Refill   albuterol (VENTOLIN HFA) 108 (90 Base) MCG/ACT inhaler Inhale 2 puffs into the lungs every 6 (six) hours as needed for wheezing or shortness of breath. 18 g 2   Cholecalciferol (VITAMIN D) 1000 UNITS capsule Take 1,000 Units by mouth daily with breakfast.      diclofenac (VOLTAREN) 75 MG EC tablet TAKE 1 TABLET BY MOUTH TWICE A DAY AS NEEDED FOR BACK PAIN 180 tablet 0   DUPIXENT 300 MG/2ML prefilled syringe INJECT 300MG  (1 SYRINGE) SUBCUTANEOUSLY EVERY OTHER WEEK. 12 mL 3   levocetirizine (XYZAL) 5 MG tablet TAKE 1 TABLET BY MOUTH EVERY DAY IN THE EVENING FOR ALLERGIES 90 tablet 2   montelukast (SINGULAIR) 10 MG tablet TAKE 1 TABLET (10 MG TOTAL) BY  MOUTH AT BEDTIME. FOR ALLERGIES AND ASTHMA 90 tablet 2   Multiple Vitamins-Minerals (MULTI FOR HER 50+) TABS Take 1 tablet by mouth daily.     Multiple Vitamins-Minerals (PRESERVISION/LUTEIN) CAPS Take 1 capsule by mouth 2 (two) times daily.     raloxifene (EVISTA) 60 MG tablet TAKE 1 TABLET (60 MG TOTAL) BY MOUTH DAILY. FOR BONE HEALTH. 90 tablet 2   SYMBICORT 160-4.5 MCG/ACT inhaler INHALE 2 PUFFS INTO THE LUNGS TWICE A DAY 10.2 each 4   No current facility-administered medications on file prior to visit.    BP 110/60  Pulse 87   Temp 98.6 F (37 C) (Temporal)   Ht 5' (1.524 m)   Wt 135 lb (61.2 kg)   SpO2 98%   BMI 26.37 kg/m  Objective:   Physical Exam Constitutional:      General: She is not in acute distress. Cardiovascular:     Rate and Rhythm: Normal rate.  Pulmonary:     Effort: Pulmonary effort is normal.  Musculoskeletal:     Left lower leg: No swelling or tenderness. No edema.       Legs:     Comments: 5/5 strength to bilateral lower extremities No calf pain or swelling  Skin:    General: Skin is warm and dry.           Assessment & Plan:  Acute pain of left lower extremity Assessment & Plan: Potentially originating as a piriformis syndrome which has compressed the sciatic nerve.  Continue to walk and stretch. Continue application of heat or ice.  Stop diclofenac medication.  Start prednisone tablets. Take two tablets my mouth once daily in the morning for four days, then one tablet once daily in the morning for four days.  Discussed to use Tylenol, 1000 mg every 8 hours as needed.  Consider physical therapy if no improvement.  Orders: -     predniSONE; Take two tablets my mouth once daily in the morning for four days, then one tablet once daily in the morning for four days.  Dispense: 12 tablet; Refill: 0        Doreene Nest, NP

## 2022-11-11 ENCOUNTER — Other Ambulatory Visit: Payer: Self-pay | Admitting: Primary Care

## 2022-11-11 DIAGNOSIS — G8929 Other chronic pain: Secondary | ICD-10-CM

## 2022-11-30 DIAGNOSIS — H353221 Exudative age-related macular degeneration, left eye, with active choroidal neovascularization: Secondary | ICD-10-CM | POA: Diagnosis not present

## 2023-01-03 ENCOUNTER — Encounter: Payer: Self-pay | Admitting: Primary Care

## 2023-01-03 ENCOUNTER — Ambulatory Visit (INDEPENDENT_AMBULATORY_CARE_PROVIDER_SITE_OTHER): Payer: Medicare HMO | Admitting: Primary Care

## 2023-01-03 VITALS — BP 136/84 | HR 85 | Temp 98.1°F | Ht 59.5 in | Wt 136.0 lb

## 2023-01-03 DIAGNOSIS — M542 Cervicalgia: Secondary | ICD-10-CM | POA: Insufficient documentation

## 2023-01-03 MED ORDER — TIZANIDINE HCL 4 MG PO TABS
4.0000 mg | ORAL_TABLET | Freq: Three times a day (TID) | ORAL | 0 refills | Status: DC | PRN
Start: 2023-01-03 — End: 2023-05-02

## 2023-01-03 NOTE — Assessment & Plan Note (Addendum)
New finding.   Neuro exam reassuring.   Based on HPI and exam, suspect muscular strain without complications.  Start Tizanidine (Zanaflex) 4 mg tablet at night PRN.  Discussed supportive care measures and stretching.   She will update if no improvement.  I evaluated patient, was consulted regarding treatment, and agree with assessment and plan per Tenna Delaine, RN, DNP student.   Mayra Reel, NP-C

## 2023-01-03 NOTE — Progress Notes (Signed)
Subjective:    Patient ID: [name], female    DOB: [birth year], 82 y.o.   MRN: [id number]  Neck Pain  Pertinent negatives include no headaches or numbness.    [name] is a very pleasant 82 y.o. female with a history of osteopenia, arthritis, chronic back pain, left lower extremity pain, left lower extremity weakness who presents today to discuss neck pain.  Her pain is located to the base of her bilateral neck with radiation down to the left shoulder through mid humeral region which began about 1 week ago. Prior to symptom onset she had been visiting family, slept on a different bed and pillow, suspects this to be the cause.   She's tried OTC topical agents, warm showers, Tylenol with temporary improvement. She denies headaches, dizziness, numbness/tingling, weakness.    Review of Systems  Musculoskeletal:  Positive for myalgias and neck pain.  Skin:  Negative for color change.  Neurological:  Negative for numbness and headaches.         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   Hearing loss    both ears, wwears hearing aides   Hyperlipidemia    Joint pain 08/06/2020   Laceration of head 05/07/2021   Lobar pneumonia (HCC) 02/19/2018   Macular degeneration    Malnutrition of moderate degree (HCC) 08/28/2014   Osteopenia    Pain of right lower extremity 06/21/2018   Peripheral neuralgia 08/18/2021   Pneumonia    Strain of cervical portion of both trapezius muscles 06/17/2022    Social History   Socioeconomic History   Marital status: Married    Spouse name: Not on file   Number of children: Not on file   Years of education: Not on file   Highest education level: Not on file   Occupational History   Occupation: retired  Tobacco Use   Smoking status: Never   Smokeless tobacco: Never  Vaping Use   Vaping status: Never Used  Substance and Sexual Activity   Alcohol use: Not Currently    Comment: occasionally   Drug use: No   Sexual activity: Not Currently  Other Topics Concern   Not on file  Social History Narrative   Married.   2 children, 6 grandchildren.   Retired. Previously was Runner, broadcasting/film/video.    Enjoys participating in church.    Social Determinants of Health   Financial Resource Strain: Low Risk  (09/27/2022)   Overall Financial Resource Strain (CARDIA)    Difficulty of Paying Living Expenses: Not hard at all  Food Insecurity: No Food Insecurity (09/27/2022)   Hunger Vital Sign    Worried About Running Out of Food in the Last Year: Never true    Ran Out of Food in the Last Year: Never true  Transportation Needs: No Transportation Needs (09/27/2022)   PRAPARE - Administrator, Civil Service (Medical): No    Lack of Transportation (Non-Medical): No  Physical Activity: Insufficiently Active (09/27/2022)   Exercise Vital Sign    Days of Exercise per Week: 7 days    Minutes of Exercise per Session: 10 min  Stress: No Stress Concern Present (09/27/2022)  [employer] of Occupational Health - Occupational Stress Questionnaire    Feeling of Stress : Not at all  Social Connections: Socially Integrated (09/27/2022)   Social Connection and Isolation Panel [NHANES]    Frequency of Communication with Friends and Family: More than three times a week    Frequency of Social Gatherings with Friends and Family: Three times a week    Attends Religious Services: More than 4 times per year    Active Member of Clubs or Organizations: Yes    Attends Banker Meetings: 1 to 4 times per year    Marital Status: Married  Catering manager Violence: Not At Risk (09/27/2022)   Humiliation, Afraid, Rape, and Kick questionnaire    Fear of Current or  Ex-Partner: No    Emotionally Abused: No    Physically Abused: No    Sexually Abused: No    Past Surgical History:  Procedure Laterality Date   ABDOMINAL HYSTERECTOMY     BREAST ENHANCEMENT SURGERY     CESAREAN SECTION     x 2   COLONOSCOPY WITH PROPOFOL N/A 04/21/2014   Procedure: COLONOSCOPY WITH PROPOFOL;  Surgeon: Charolett Bumpers, MD;  Location: [facility];  Service: Endoscopy;  Laterality: N/A;   EYE SURGERY Bilateral march 2015 and june 2015   lens for cataracts   LEFT HEART CATHETERIZATION WITH CORONARY ANGIOGRAM N/A 01/24/2012   Procedure: LEFT HEART CATHETERIZATION WITH CORONARY ANGIOGRAM;  Surgeon: Quintella Reichert, MD;  Location: [facility];  Service: Cardiovascular;  Laterality: N/A;   MASTECTOMY  33 yrs ago   bilateral    Family History  Problem Relation Age of Onset   Macular degeneration Mother    Heart attack Father     Allergies  Allergen Reactions   Fosamax [Alendronate Sodium] Nausea Only   Sulfonamide Derivatives Rash    Current Outpatient Medications on File Prior to Visit  Medication Sig Dispense Refill   albuterol (VENTOLIN HFA) 108 (90 Base) MCG/ACT inhaler Inhale 2 puffs into the lungs every 6 (six) hours as needed for wheezing or shortness of breath. 18 g 2   Cholecalciferol (VITAMIN D) 1000 UNITS capsule Take 1,000 Units by mouth daily with breakfast.      diclofenac (VOLTAREN) 75 MG EC tablet TAKE 1 TABLET BY MOUTH TWICE A DAY AS NEEDED FOR BACK PAIN 180 tablet 0   DUPIXENT 300 MG/2ML prefilled syringe INJECT 300MG  (1 SYRINGE) SUBCUTANEOUSLY EVERY OTHER WEEK. 12 mL 3   levocetirizine (XYZAL) 5 MG tablet TAKE 1 TABLET BY MOUTH EVERY DAY IN THE EVENING FOR ALLERGIES 90 tablet 2   montelukast (SINGULAIR) 10 MG tablet TAKE 1 TABLET (10 MG TOTAL) BY MOUTH AT BEDTIME. FOR ALLERGIES AND ASTHMA 90 tablet 2   Multiple Vitamins-Minerals (MULTI FOR HER 50+) TABS Take 1 tablet by mouth daily.     Multiple Vitamins-Minerals (PRESERVISION/LUTEIN) CAPS Take 1  capsule by mouth 2 (two) times daily.     raloxifene (EVISTA) 60 MG tablet TAKE 1 TABLET (60 MG TOTAL) BY MOUTH DAILY. FOR BONE HEALTH. 90 tablet 2   SYMBICORT 160-4.5 MCG/ACT inhaler INHALE 2 PUFFS INTO THE LUNGS TWICE A DAY 10.2 each 4   No current facility-administered medications on file prior to visit.    BP 136/84   Pulse 85   Temp 98.1 F (36.7 C) (Temporal)   Ht 4' 11.5" (1.511 m)   Wt 136 lb (61.7 kg)   SpO2 98%   BMI 27.01 kg/m  Objective:   Physical  Exam Constitutional:      General: She is not in acute distress. Cardiovascular:     Rate and Rhythm: Normal rate.  Pulmonary:     Effort: Pulmonary effort is normal.  Musculoskeletal:     Right shoulder: Normal. Normal range of motion.     Left shoulder: Normal. Normal range of motion.     Cervical back: Pain with movement present. No spinous process tenderness or muscular tenderness. Decreased range of motion.  Skin:    General: Skin is warm and dry.           Assessment & Plan:  Acute neck pain Assessment & Plan: New finding.   Neuro exam reassuring.   Based on HPI and exam, suspect muscular strain without complications.  Start Tizanidine (Zanaflex) 4 mg tablet at night PRN.  Discussed supportive care measures and stretching.   She will update if no improvement.  I evaluated patient, was consulted regarding treatment, and agree with assessment and plan per Tenna Delaine, RN, DNP student.   Mayra Reel, NP-C   Orders: -     tiZANidine HCl; Take 1 tablet (4 mg total) by mouth every 8 (eight) hours as needed for muscle spasms.  Dispense: 15 tablet; Refill: 0        Doreene Nest, NP

## 2023-01-03 NOTE — Patient Instructions (Addendum)
Start Tizanidine (Zanaflex) 1 tablet at night as needed for muscle strain.  Perform stretching exercises daily.  Use heat, ice, rest, and Tylenol as needed.   It was a pleasure to see you today!

## 2023-01-03 NOTE — Progress Notes (Signed)
Acute Office Visit  Subjective:     Patient ID: [name], female    DOB: 12-18-1940, 82 y.o.   MRN: [id number]  Chief Complaint  Patient presents with   Neck Pain    X1 week  Pain starts at the base of the beck radiates down into left shoulder    Neck Pain  Pertinent negatives include no chest pain, fever, headaches, tingling or weakness.   [name] is a pleasant 82 y.o. female with a history of osteopenia, degenerative arthritis, chronic back pain, lower extremity weakness, lower extremity pain who presents today to discuss neck pain.   She went to visit her daughter and family a week and a half ago, where she slept on a bed and pillow that was not her usual one for 4 days. 1 week ago she developed a "crick" in her neck that has worsened. She has pain to the base of her neck bilaterally that radiates to left mid humerus region. She has tried Bengay pain gel which helps for approximately 1 hour. OTC Tylenol has helped minimally. Her pain 4-5/10 and is described as aching, sore. Twisting motions and lifting makes the pain worse. She has not had any significant improvement in symptoms for 1 week.    Review of Systems  Constitutional:  Negative for chills, fever and malaise/fatigue.  Eyes:  Negative for blurred vision and double vision.  Respiratory:  Negative for shortness of breath.   Cardiovascular:  Negative for chest pain.  Gastrointestinal:  Negative for constipation and diarrhea.  Musculoskeletal:  Positive for myalgias and neck pain. Negative for falls.  Neurological:  Negative for dizziness, tingling, sensory change, weakness and headaches.       Objective:    BP 136/84   Pulse 85   Temp 98.1 F (36.7 C) (Temporal)   Ht 4' 11.5" (1.511 m)   Wt 136 lb (61.7 kg)   SpO2 98%   BMI 27.01 kg/m   BP Readings from Last 3 Encounters:  01/03/23 136/84  11/01/22 110/60  08/12/22 126/80   Wt Readings from Last 3 Encounters:  01/03/23 136 lb (61.7 kg)  11/01/22 135  lb (61.2 kg)  09/27/22 134 lb (60.8 kg)    Physical Exam HENT:     Head: Normocephalic and atraumatic.  Neck:     Comments: Muscular tenderness but not spinal process tenderness. Muscle tension present. Decreased cervical range of motion with left lateral rotation. Cardiovascular:     Rate and Rhythm: Normal rate and regular rhythm.  Pulmonary:     Effort: Pulmonary effort is normal.     Breath sounds: Normal breath sounds.  Musculoskeletal:        General: Tenderness present. No swelling, deformity or signs of injury.     Cervical back: Tenderness present. No rigidity.     Comments: Left mid-humerus region muscular tenderness, ROM intact  Skin:    General: Skin is warm and dry.  Neurological:     General: No focal deficit present.     Mental Status: She is alert and oriented to person, place, and time.     Cranial Nerves: No cranial nerve deficit.     Sensory: No sensory deficit.  Psychiatric:        Mood and Affect: Mood normal.        Behavior: Behavior normal.    No results found for any visits on 01/03/23.      Assessment & Plan:   Problem List Items Addressed  This Visit       Other   Acute neck pain - Primary    New finding.   Neuro exam reassuring.   Based on HPI and exam, suspect muscular strain without complications.  Start Tizanidine (Zanaflex) 4 mg tablet at night PRN.  Discussed supportive care measures and stretching.   She will update if no improvement.      Relevant Medications   tiZANidine (ZANAFLEX) 4 MG tablet    Meds ordered this encounter  Medications   tiZANidine (ZANAFLEX) 4 MG tablet    Sig: Take 1 tablet (4 mg total) by mouth every 8 (eight) hours as needed for muscle spasms.    Dispense:  15 tablet    Refill:  0    No follow-ups on file.  Benito Mccreedy, RN

## 2023-01-06 ENCOUNTER — Telehealth: Payer: Self-pay | Admitting: Primary Care

## 2023-01-06 DIAGNOSIS — J4540 Moderate persistent asthma, uncomplicated: Secondary | ICD-10-CM

## 2023-01-06 MED ORDER — ALBUTEROL SULFATE HFA 108 (90 BASE) MCG/ACT IN AERS
2.0000 | INHALATION_SPRAY | Freq: Four times a day (QID) | RESPIRATORY_TRACT | 0 refills | Status: DC | PRN
Start: 2023-01-06 — End: 2023-06-05

## 2023-01-06 NOTE — Telephone Encounter (Signed)
Prescription Request  01/06/2023  LOV: 01/03/2023  What is the name of the medication or equipment? albuterol (VENTOLIN HFA) 108 (90 Base) MCG/ACT inhaler   Have you contacted your pharmacy to request a refill? No   Which pharmacy would you like this sent to?  [id number], [place] - [address] [place] [address] Phone: [phone] Fax: [phone]    Patient notified that their request is being sent to the clinical staff for review and that they should receive a response within 2 business days.   Please advise at Mobile [phone] (mobile)

## 2023-01-06 NOTE — Telephone Encounter (Signed)
Refills sent to pharmacy. 

## 2023-01-11 DIAGNOSIS — H353221 Exudative age-related macular degeneration, left eye, with active choroidal neovascularization: Secondary | ICD-10-CM | POA: Diagnosis not present

## 2023-01-11 DIAGNOSIS — H353133 Nonexudative age-related macular degeneration, bilateral, advanced atrophic without subfoveal involvement: Secondary | ICD-10-CM | POA: Diagnosis not present

## 2023-01-11 DIAGNOSIS — Z961 Presence of intraocular lens: Secondary | ICD-10-CM | POA: Diagnosis not present

## 2023-01-23 ENCOUNTER — Telehealth: Payer: Self-pay | Admitting: Pulmonary Disease

## 2023-01-23 DIAGNOSIS — J8283 Eosinophilic asthma: Secondary | ICD-10-CM

## 2023-01-23 MED ORDER — DUPIXENT 300 MG/2ML ~~LOC~~ SOSY: PREFILLED_SYRINGE | SUBCUTANEOUS | 0 refills | Status: DC

## 2023-01-23 NOTE — Telephone Encounter (Signed)
Rx for Dupixent sent to [facility].  Patient also needs to established with new provider since she is former Dr. Craige Cotta patient  [name], PharmD, MPH, BCPS, CPP Clinical Pharmacist (Rheumatology and Pulmonology)

## 2023-01-23 NOTE — Telephone Encounter (Signed)
Patient states needs refill for Dupixent. Enrolled thru Dupixent my Way. Patient phone number is [phone].

## 2023-02-03 ENCOUNTER — Telehealth: Payer: Self-pay | Admitting: Primary Care

## 2023-02-03 NOTE — Telephone Encounter (Signed)
Pt called stating she received her flu shot from her pharmacy & was told she needed her pneumonia shot. Pt states she doesn't remember being told she needed that shot. Pt asked is she due for her pneumonia shot or not? Call back # [phone]

## 2023-02-03 NOTE — Telephone Encounter (Signed)
She would qualify for the new Prevnar 20. She can get this through our office or at the pharmacy.

## 2023-02-06 NOTE — Telephone Encounter (Signed)
Left detailed voicemail, per dpr advising patient of [name] message. Advised to call back if she would like to schedule NV here for vaccine.

## 2023-02-06 NOTE — Telephone Encounter (Signed)
Left message to return call to our office.  

## 2023-02-07 ENCOUNTER — Other Ambulatory Visit: Payer: Self-pay | Admitting: Primary Care

## 2023-02-07 DIAGNOSIS — G8929 Other chronic pain: Secondary | ICD-10-CM

## 2023-03-01 DIAGNOSIS — H353221 Exudative age-related macular degeneration, left eye, with active choroidal neovascularization: Secondary | ICD-10-CM | POA: Diagnosis not present

## 2023-03-28 ENCOUNTER — Ambulatory Visit (INDEPENDENT_AMBULATORY_CARE_PROVIDER_SITE_OTHER): Payer: Medicare HMO | Admitting: Family

## 2023-03-28 ENCOUNTER — Encounter: Payer: Self-pay | Admitting: Family

## 2023-03-28 VITALS — BP 124/84 | HR 102 | Temp 98.4°F | Ht 59.5 in | Wt 136.2 lb

## 2023-03-28 DIAGNOSIS — J029 Acute pharyngitis, unspecified: Secondary | ICD-10-CM | POA: Diagnosis not present

## 2023-03-28 DIAGNOSIS — J22 Unspecified acute lower respiratory infection: Secondary | ICD-10-CM

## 2023-03-28 DIAGNOSIS — B9689 Other specified bacterial agents as the cause of diseases classified elsewhere: Secondary | ICD-10-CM | POA: Diagnosis not present

## 2023-03-28 DIAGNOSIS — R059 Cough, unspecified: Secondary | ICD-10-CM | POA: Diagnosis not present

## 2023-03-28 MED ORDER — DOXYCYCLINE HYCLATE 100 MG PO TABS: 100.0000 mg | ORAL_TABLET | Freq: Two times a day (BID) | ORAL | 0 refills | Status: DC

## 2023-03-28 NOTE — Assessment & Plan Note (Addendum)
 RX doxycycline  100 mg po bid x 10 days Pt states she seems to remember she didn't tolerate Augmentin  very well.  Can continue with night time cough suppressant and tylenol  prn aches and pains.  Take antibiotic as prescribed. Increase oral fluids. Pt to f/u if sx worsen and or fail to improve in 2-3 days.  Pt declines drops for eyes, she states she has some at home that have been helping.  Advised pt she can also apply warm compresses to below eyes prn

## 2023-03-28 NOTE — Progress Notes (Signed)
 Established Patient Office Visit  Subjective:   Patient ID: [name], female    DOB: [birth year]  Age: 82 y.o. MRN: [id number]  CC:  Chief Complaint  Patient presents with   Acute Visit    Reports increased cough, ear pain, sinus congestion, chest tightness, jaw/teeth pain and headache x1 week.    HPI: [name] is a 82 y.o. female presenting on 03/28/2023 for Acute Visit (Reports increased cough, ear pain, sinus congestion, chest tightness, jaw/teeth pain and headache x1 week.)  About one week ago started with nasal congestion which has since progressed cough, chest congestion, bli ear pain, sinus pressure, and jaw pain with headache. Taking tylenol  in am and nyquil at night time.  She also reports crusting in the am with both eyes with eye drainage, clear.       ROS: Negative unless specifically indicated above in HPI.   Relevant past medical history reviewed and updated as indicated.   Allergies and medications reviewed and updated.   Current Outpatient Medications:    albuterol  (VENTOLIN  HFA) 108 (90 Base) MCG/ACT inhaler, Inhale 2 puffs into the lungs every 6 (six) hours as needed for wheezing or shortness of breath., Disp: 18 g, Rfl: 0   Cholecalciferol (VITAMIN D ) 1000 UNITS capsule, Take 1,000 Units by mouth daily with breakfast. , Disp: , Rfl:    diclofenac  (VOLTAREN ) 75 MG EC tablet, TAKE 1 TABLET BY MOUTH TWICE A DAY AS NEEDED FOR BACK PAIN, Disp: 180 tablet, Rfl: 0   doxycycline  (VIBRA -TABS) 100 MG tablet, Take 1 tablet (100 mg total) by mouth 2 (two) times daily for 10 days., Disp: 20 tablet, Rfl: 0   DUPIXENT  300 MG/2ML prefilled syringe, INJECT 300MG  (1 SYRINGE) SUBCUTANEOUSLY EVERY OTHER WEEK., Disp: 12 mL, Rfl: 0   levocetirizine (XYZAL ) 5 MG tablet, TAKE 1 TABLET BY MOUTH EVERY DAY IN THE EVENING FOR ALLERGIES, Disp: 90 tablet, Rfl: 2   montelukast  (SINGULAIR ) 10 MG tablet, TAKE 1 TABLET (10 MG TOTAL) BY MOUTH AT BEDTIME. FOR ALLERGIES AND ASTHMA,  Disp: 90 tablet, Rfl: 2   Multiple Vitamins-Minerals (MULTI FOR HER 50+) TABS, Take 1 tablet by mouth daily., Disp: , Rfl:    Multiple Vitamins-Minerals (PRESERVISION/LUTEIN) CAPS, Take 1 capsule by mouth 2 (two) times daily., Disp: , Rfl:    raloxifene  (EVISTA ) 60 MG tablet, TAKE 1 TABLET (60 MG TOTAL) BY MOUTH DAILY. FOR BONE HEALTH., Disp: 90 tablet, Rfl: 2   SYMBICORT  160-4.5 MCG/ACT inhaler, INHALE 2 PUFFS INTO THE LUNGS TWICE A DAY, Disp: 10.2 each, Rfl: 4   tiZANidine  (ZANAFLEX ) 4 MG tablet, Take 1 tablet (4 mg total) by mouth every 8 (eight) hours as needed for muscle spasms., Disp: 15 tablet, Rfl: 0  Allergies  Allergen Reactions   Fosamax [Alendronate Sodium] Nausea Only   Sulfonamide Derivatives Rash    Objective:   BP 124/84 (BP Location: Left Arm, Patient Position: Sitting, Cuff Size: Normal)   Pulse (!) 102   Temp 98.4 F (36.9 C) (Temporal)   Ht 4' 11.5 (1.511 m)   Wt 136 lb 3.2 oz (61.8 kg)   SpO2 96%   BMI 27.05 kg/m    Physical Exam Constitutional:      General: She is not in acute distress.    Appearance: Normal appearance. She is normal weight. She is not ill-appearing, toxic-appearing or diaphoretic.  HENT:     Head: Normocephalic.     Right Ear: Tympanic membrane normal.     Left  Ear: Tympanic membrane normal.     Nose: Nose normal.     Mouth/Throat:     Mouth: Mucous membranes are dry.     Pharynx: No oropharyngeal exudate or posterior oropharyngeal erythema.  Eyes:     General: Lids are normal.     Extraocular Movements: Extraocular movements intact.     Conjunctiva/sclera:     Right eye: Right conjunctiva is not injected.     Left eye: Left conjunctiva is not injected.     Pupils: Pupils are equal, round, and reactive to light.  Cardiovascular:     Rate and Rhythm: Normal rate and regular rhythm.     Pulses: Normal pulses.     Heart sounds: Normal heart sounds.  Pulmonary:     Effort: Pulmonary effort is normal.     Breath sounds: Normal  breath sounds.  Musculoskeletal:     Cervical back: Normal range of motion.  Neurological:     General: No focal deficit present.     Mental Status: She is alert and oriented to person, place, and time. Mental status is at baseline.  Psychiatric:        Mood and Affect: Mood normal.        Behavior: Behavior normal.        Thought Content: Thought content normal.        Judgment: Judgment normal.     Assessment & Plan:  Cough, unspecified type -     POC COVID-19 BinaxNow  Sore throat -     POCT rapid strep A  Bacterial lower respiratory infection Assessment & Plan: RX doxycycline  100 mg po bid x 10 days Pt states she seems to remember she didn't tolerate Augmentin  very well.  Can continue with night time cough suppressant and tylenol  prn aches and pains.  Take antibiotic as prescribed. Increase oral fluids. Pt to f/u if sx worsen and or fail to improve in 2-3 days.  Pt declines drops for eyes, she states she has some at home that have been helping.  Advised pt she can also apply warm compresses to below eyes prn   Orders: -     Doxycycline  Hyclate; Take 1 tablet (100 mg total) by mouth 2 (two) times daily for 10 days.  Dispense: 20 tablet; Refill: 0     Follow up plan: Return for f/u PCP if no improvement in symptoms.  [name], FNP

## 2023-04-03 ENCOUNTER — Other Ambulatory Visit: Payer: Self-pay | Admitting: Primary Care

## 2023-04-03 DIAGNOSIS — J8283 Eosinophilic asthma: Secondary | ICD-10-CM

## 2023-04-03 MED ORDER — SYMBICORT 160-4.5 MCG/ACT IN AERO
2.0000 | INHALATION_SPRAY | Freq: Two times a day (BID) | RESPIRATORY_TRACT | 0 refills | Status: DC
Start: 2023-04-03 — End: 2023-06-05

## 2023-04-03 NOTE — Telephone Encounter (Unsigned)
 Copied from CRM [id number]. Topic: Clinical - Medication Refill >> Apr 03, 2023 10:21 AM Thersia BROCKS wrote: Most Recent Primary Care Visit:  Provider: CORWIN ANTU  Department: [facility]  Visit Type: ACUTE  Date: 03/28/2023  Medication: SYMBICORT  160-4.5 MCG/ACT inhaler  Has the patient contacted their pharmacy? Yes (Agent: If no, request that the patient contact the pharmacy for the refill. If patient does not wish to contact the pharmacy document the reason why and proceed with request.) (Agent: If yes, when and what did the pharmacy advise?)  Is this the correct pharmacy for this prescription? Yes If no, delete pharmacy and type the correct one.  This is the patient's preferred pharmacy:  [phone] [place] - [address] [place] [address] [place] [address] Phone: [phone] Fax: [phone]    Has the prescription been filled recently? No  Is the patient out of the medication? Yes  Has the patient been seen for an appointment in the last year OR does the patient have an upcoming appointment? Yes  Can we respond through MyChart? Yes  Agent: Please be advised that Rx refills may take up to 3 business days. We ask that you follow-up with your pharmacy.

## 2023-04-03 NOTE — Telephone Encounter (Signed)
 Copied from CRM [id number]. Topic: Clinical - Medication Refill >> Apr 03, 2023 10:21 AM Thersia BROCKS wrote: Most Recent Primary Care Visit:  Provider: CORWIN ANTU  Department: [facility]  Visit Type: ACUTE  Date: 03/28/2023  Medication: SYMBICORT  160-4.5 MCG/ACT inhaler  Has the patient contacted their pharmacy? Yes (Agent: If no, request that the patient contact the pharmacy for the refill. If patient does not wish to contact the pharmacy document the reason why and proceed with request.) (Agent: If yes, when and what did the pharmacy advise?)  Is this the correct pharmacy for this prescription? Yes If no, delete pharmacy and type the correct one.  This is the patient's preferred pharmacy:  [phone], [place] - [address] [place] [address] [place] [address] Phone: [phone] Fax: [phone]    Has the prescription been filled recently? No  Is the patient out of the medication? Yes  Has the patient been seen for an appointment in the last year OR does the patient have an upcoming appointment? Yes  Can we respond through MyChart? Yes  Agent: Please be advised that Rx refills may take up to 3 business days. We ask that you follow-up with your pharmacy.

## 2023-04-07 ENCOUNTER — Ambulatory Visit: Payer: Medicare HMO | Admitting: Primary Care

## 2023-04-07 ENCOUNTER — Encounter: Payer: Self-pay | Admitting: Primary Care

## 2023-04-07 ENCOUNTER — Ambulatory Visit (INDEPENDENT_AMBULATORY_CARE_PROVIDER_SITE_OTHER): Payer: Medicare HMO | Admitting: Primary Care

## 2023-04-07 ENCOUNTER — Ambulatory Visit: Payer: Self-pay | Admitting: Primary Care

## 2023-04-07 VITALS — BP 138/76 | HR 100 | Temp 97.9°F | Ht 59.5 in | Wt 138.0 lb

## 2023-04-07 DIAGNOSIS — R350 Frequency of micturition: Secondary | ICD-10-CM

## 2023-04-07 LAB — POC URINALSYSI DIPSTICK (AUTOMATED)
Bilirubin, UA: POSITIVE
Blood, UA: NEGATIVE
Glucose, UA: POSITIVE — AB
Ketones, UA: POSITIVE
Nitrite, UA: POSITIVE
Protein, UA: POSITIVE — AB
Spec Grav, UA: >=1.03 — AB (ref 1.010–1.025)
Urobilinogen, UA: >=8 U/dL — AB
pH, UA: 5 (ref 5.0–8.0)

## 2023-04-07 MED ORDER — NITROFURANTOIN MONOHYD MACRO 100 MG PO CAPS: 100.0000 mg | ORAL_CAPSULE | Freq: Two times a day (BID) | ORAL | 0 refills | Status: AC

## 2023-04-07 NOTE — Assessment & Plan Note (Addendum)
 Urinalysis today with 3+ leuks, positive nitrites, no blood.  The positive nitrites could be from Azo. Urine culture ordered and pending.  Given symptoms, coupled with urinalysis results, we will treat.  Start Macrobid  (nitrofurantoin ) tablets for urinary tract infection. Take 1 tablet by mouth twice daily for 5 days. Follow up as needed.

## 2023-04-07 NOTE — Addendum Note (Signed)
 Addended by: Doreene Nest on: 04/07/2023 01:37 PM   Modules accepted: Orders

## 2023-04-07 NOTE — Telephone Encounter (Signed)
 Patient evaluated.

## 2023-04-07 NOTE — Telephone Encounter (Signed)
 Copied from CRM [id number]. Topic: Clinical - Red Word Triage >> Apr 07, 2023  9:44 AM Robinson DEL wrote: Red Word that prompted transfer to Nurse Triage: Patient states she has a UTI, having burning and belly discomfort.   Chief Complaint: suspected UTI Symptoms: discomfort very low in belly, urinary frequency Frequency: continual, intermittent Pertinent Negatives: Patient denies urinary urgency, blood in urine, only droplets of urine, strong urge to urinate after urinating, fever, back pain, vaginal discharge Disposition: [] ED /[] Urgent Care (no appt availability in office) / [x] Appointment(In office/virtual)/ []  [facility]/ [] Home Care/ [] Refused Recommended Disposition /[] [facility]/ []  Follow-up with PCP Additional Notes: Pt reporting that since last night she has been experiencing urinary frequency and discomfort very low in belly, and burning with urination. Pt reporting hx of UTI, suspects UTI currently. Pt reporting just finished 10 days of antibiotics for sinus infection. Pt reporting she found Azo standard 5 years out of date in home and took 2 pills this morning, not sure if helped at all but right now the burning is not as bad. Pt reporting that she is peeing her normal amount but reporting haven't had a lot since been on antibiotic, just a very light stream. Pt confirms still feeling like bladder a little bit' full after go to bathroom, but denies strong urge to urinate after urinating, just still have the urge, doesn't go away. Advised exam today, scheduled with PCP office today. Advised drink lots of water, try cranberry juice, pt states drink cranberry juice every day, doesn't help, and advised sitz bath for burning, pt states just going to do Azo standard. Please advise.  Reason for Disposition  Age > 50 years  Answer Assessment - Initial Assessment Questions 1. SYMPTOM: What's the main symptom you're concerned about? (e.g., frequency,  incontinence)     Frequency having to go all the time, discomfort very low in belly, burning with urination 2. ONSET: When did the  symptoms  start?     Last night 1 day after finishing 10-day antibiotic for sinus infection 3. PAIN: Is there any pain? If Yes, ask: How bad is it? (Scale: 1-10; mild, moderate, severe)     Discomfort very low in belly, mild 4. CAUSE: What do you think is causing the symptoms?     UTI  5. OTHER SYMPTOMS: Do you have any other symptoms? (e.g., blood in urine, fever, flank pain, pain with urination)     Denies fever, no pain in back, no pain with urination, no blood in urine.  Answer Assessment - Initial Assessment Questions 1. SEVERITY: How bad is the pain?  (e.g., Scale 1-10; mild, moderate, or severe)   - MILD (1-3): complains slightly about urination hurting   - MODERATE (4-7): interferes with normal activities     - SEVERE (8-10): excruciating, unwilling or unable to urinate because of the pain      mild 2. FREQUENCY: How many times have you had painful urination today?      3x since getting up at 7:30 am, once at 6 am 3. PATTERN: Is pain present every time you urinate or just sometimes?      Every time 4. ONSET: When did the painful urination start?      Last night 5. FEVER: Do you have a fever? If Yes, ask: What is your temperature, how was it measured, and when did it start?     denies 6. PAST UTI: Have you had a urine infection before? If Yes,  ask: When was the last time? and What happened that time?      Oh yes, I'm 82 7. CAUSE: What do you think is causing the painful urination?  (e.g., UTI, scratch, Herpes sore)     UTI 8. OTHER SYMPTOMS: Do you have any other symptoms? (e.g., blood in urine, flank pain, genital sores, urgency, vaginal discharge)     Frequency, belly discomfort very low. Denies urgency  Protocols used: Urinary Symptoms-A-AH, Urination Pain - Female-A-AH

## 2023-04-07 NOTE — Progress Notes (Signed)
 Subjective:    Patient ID: [name], female    DOB: 1940-08-01, 83 y.o.   MRN: [id number]  HPI  [name] is a very pleasant 83 y.o. female with a history of urinary incontinence, eosinophilic asthma, prediabetes, hyperlipidemia who presents today to discuss vaginal irritation.  Symptom onset last night with vaginal irritation when wiping after urination, bladder heaviness, fatigue, urinary frequency, vaginal itching.   She completed a course of doxycycline  antibiotics 2 days ago for sinusitis. She denies hematuria, flank pain, vaginal discharge, fevers.   She did take a dose of AZO this morning at 6 AM.   Review of Systems  Constitutional:  Negative for fever.  Genitourinary:  Positive for dysuria and frequency. Negative for hematuria and vaginal discharge.       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   Hearing loss    both ears, wwears hearing aides   Hyperlipidemia    Joint pain 08/06/2020   Laceration of head 05/07/2021   Lobar pneumonia (HCC) 02/19/2018   Macular degeneration    Malnutrition of moderate degree (HCC) 08/28/2014   Osteopenia    Pain of right lower extremity 06/21/2018   Peripheral neuralgia 08/18/2021   Pneumonia    Strain of cervical portion of both trapezius muscles 06/17/2022    Social History   Socioeconomic History   Marital status: Married    Spouse name: Not on file   Number of children: Not on file   Years of education: Not on file   Highest education level: Not on file  Occupational History   Occupation: retired  Tobacco Use   Smoking status: Never   Smokeless tobacco: Never  Vaping Use   Vaping status: Never Used  Substance  and Sexual Activity   Alcohol use: Not Currently    Comment: occasionally   Drug use: No   Sexual activity: Not Currently  Other Topics Concern   Not on file  Social History Narrative   Married.   2 children, 6 grandchildren.   Retired. Previously was runner, broadcasting/film/video.    Enjoys participating in church.    Social Drivers of Corporate Investment Banker Strain: Low Risk  (09/27/2022)   Overall Financial Resource Strain (CARDIA)    Difficulty of Paying Living Expenses: Not hard at all  Food Insecurity: No Food Insecurity (09/27/2022)   Hunger Vital Sign    Worried About Running Out of Food in the Last Year: Never true    Ran Out of Food in the Last Year: Never true  Transportation Needs: No Transportation Needs (09/27/2022)   PRAPARE - Administrator, Civil Service (Medical): No    Lack of Transportation (Non-Medical): No  Physical Activity: Insufficiently Active (09/27/2022)   Exercise Vital Sign    Days of Exercise per Week: 7 days    Minutes of Exercise per Session: 10 min  Stress: No Stress Concern Present (09/27/2022)   [employer] of Occupational Health - Occupational Stress Questionnaire    Feeling of Stress : Not at all  Social Connections: Socially Integrated (09/27/2022)   Social Connection and  Isolation Panel [NHANES]    Frequency of Communication with Friends and Family: More than three times a week    Frequency of Social Gatherings with Friends and Family: Three times a week    Attends Religious Services: More than 4 times per year    Active Member of Clubs or Organizations: Yes    Attends Banker Meetings: 1 to 4 times per year    Marital Status: Married  Catering Manager Violence: Not At Risk (09/27/2022)   Humiliation, Afraid, Rape, and Kick questionnaire    Fear of Current or Ex-Partner: No    Emotionally Abused: No    Physically Abused: No    Sexually Abused: No    Past Surgical History:  Procedure Laterality Date   ABDOMINAL HYSTERECTOMY      BREAST ENHANCEMENT SURGERY     CESAREAN SECTION     x 2   COLONOSCOPY WITH PROPOFOL  N/A 04/21/2014   Procedure: COLONOSCOPY WITH PROPOFOL ;  Surgeon: Gladis MARLA Louder, MD;  Location: [facility];  Service: Endoscopy;  Laterality: N/A;   EYE SURGERY Bilateral march 2015 and june 2015   lens for cataracts   LEFT HEART CATHETERIZATION WITH CORONARY ANGIOGRAM N/A 01/24/2012   Procedure: LEFT HEART CATHETERIZATION WITH CORONARY ANGIOGRAM;  Surgeon: Wilbert JONELLE Bihari, MD;  Location: [facility];  Service: Cardiovascular;  Laterality: N/A;   MASTECTOMY  33 yrs ago   bilateral    Family History  Problem Relation Age of Onset   Macular degeneration Mother    Heart attack Father     Allergies  Allergen Reactions   Fosamax [Alendronate Sodium] Nausea Only   Sulfonamide Derivatives Rash    Current Outpatient Medications on File Prior to Visit  Medication Sig Dispense Refill   albuterol  (VENTOLIN  HFA) 108 (90 Base) MCG/ACT inhaler Inhale 2 puffs into the lungs every 6 (six) hours as needed for wheezing or shortness of breath. 18 g 0   Cholecalciferol (VITAMIN D ) 1000 UNITS capsule Take 1,000 Units by mouth daily with breakfast.      diclofenac  (VOLTAREN ) 75 MG EC tablet TAKE 1 TABLET BY MOUTH TWICE A DAY AS NEEDED FOR BACK PAIN 180 tablet 0   DUPIXENT  300 MG/2ML prefilled syringe INJECT 300MG  (1 SYRINGE) SUBCUTANEOUSLY EVERY OTHER WEEK. 12 mL 0   levocetirizine (XYZAL ) 5 MG tablet TAKE 1 TABLET BY MOUTH EVERY DAY IN THE EVENING FOR ALLERGIES 90 tablet 2   montelukast  (SINGULAIR ) 10 MG tablet TAKE 1 TABLET (10 MG TOTAL) BY MOUTH AT BEDTIME. FOR ALLERGIES AND ASTHMA 90 tablet 2   Multiple Vitamins-Minerals (MULTI FOR HER 50+) TABS Take 1 tablet by mouth daily.     Multiple Vitamins-Minerals (PRESERVISION/LUTEIN) CAPS Take 1 capsule by mouth 2 (two) times daily.     raloxifene  (EVISTA ) 60 MG tablet TAKE 1 TABLET (60 MG TOTAL) BY MOUTH DAILY. FOR BONE HEALTH. 90 tablet 2   SYMBICORT  160-4.5 MCG/ACT  inhaler Inhale 2 puffs into the lungs 2 (two) times daily. 30.6 each 0   tiZANidine  (ZANAFLEX ) 4 MG tablet Take 1 tablet (4 mg total) by mouth every 8 (eight) hours as needed for muscle spasms. 15 tablet 0   No current facility-administered medications on file prior to visit.    BP 138/76   Pulse 100   Temp 97.9 F (36.6 C) (Temporal)   Ht 4' 11.5 (1.511 m)   Wt 138 lb (62.6 kg)   SpO2 98%   BMI 27.41 kg/m  Objective:   Physical Exam Cardiovascular:  Rate and Rhythm: Normal rate and regular rhythm.  Pulmonary:     Effort: Pulmonary effort is normal.     Breath sounds: Normal breath sounds.  Musculoskeletal:     Cervical back: Neck supple.  Skin:    General: Skin is warm and dry.  Neurological:     Mental Status: She is alert and oriented to person, place, and time.  Psychiatric:        Mood and Affect: Mood normal.           Assessment & Plan:  Urinary frequency Assessment & Plan: Urinalysis today with 3+ leuks, positive nitrites, no blood.  The positive nitrites could be from Azo. Urine culture ordered and pending.  Given symptoms, coupled with urinalysis results, we will treat.  Start Macrobid  (nitrofurantoin ) tablets for urinary tract infection. Take 1 tablet by mouth twice daily for 5 days. Follow up as needed.  Orders: -     POCT Urinalysis Dipstick (Automated) -     Nitrofurantoin  Monohyd Macro; Take 1 capsule (100 mg total) by mouth 2 (two) times daily for 5 days.  Dispense: 10 capsule; Refill: 0        Comer MARLA Gaskins, NP

## 2023-04-07 NOTE — Patient Instructions (Signed)
 Start Macrobid (nitrofurantoin) tablets for urinary tract infection. Take 1 tablet by mouth twice daily for 5 days.  Increase water intake.  It was a pleasure to see you today!

## 2023-04-07 NOTE — Telephone Encounter (Signed)
 Copied from CRM [id number]. Topic: Clinical - Medication Question >> Apr 07, 2023  7:49 AM Eleanor C wrote: Reason for CRM: patient just finished a round of doxycycline  (VIBRA -TABS) 100 MG tablet and now has a Urinary Tract Infection. Patient wants to know if she needs to be prescribed something or needs to come in. Please advise with patient. Thank you.  04/07/23 0925: Called to triage medication question ( CRM [id number]) no answer, left vm

## 2023-04-08 LAB — URINE CULTURE
MICRO NUMBER:: 15943342
Result:: NO GROWTH
SPECIMEN QUALITY:: ADEQUATE

## 2023-04-11 ENCOUNTER — Telehealth: Payer: Self-pay | Admitting: Primary Care

## 2023-04-11 ENCOUNTER — Other Ambulatory Visit: Payer: Self-pay | Admitting: Primary Care

## 2023-04-11 ENCOUNTER — Telehealth: Payer: Self-pay

## 2023-04-11 DIAGNOSIS — N898 Other specified noninflammatory disorders of vagina: Secondary | ICD-10-CM

## 2023-04-11 MED ORDER — FLUCONAZOLE 150 MG PO TABS: ORAL_TABLET | ORAL | 0 refills | Status: DC

## 2023-04-11 NOTE — Telephone Encounter (Signed)
 Copied from CRM [id number]. Topic: General - Other >> Apr 07, 2023  3:03 PM Russell PARAS wrote: Reason for CRM: Patient returning call from office. No note in chart, transferred to CAL >> Apr 10, 2023  5:31 PM Tiffany H wrote: Patient called to request lab results. She would like to speak with Burnard. Patient indicated that she's started her own medication regimen. Please call patient back to answer questions. Please call after 9AM.

## 2023-04-11 NOTE — Telephone Encounter (Signed)
 This has been addressed. See result note for further documentation.

## 2023-04-11 NOTE — Telephone Encounter (Signed)
This has been addressed.  See result note.

## 2023-04-11 NOTE — Telephone Encounter (Signed)
 Copied from CRM [id number]. Topic: Clinical - Lab/Test Results >> Apr 11, 2023  9:14 AM [name] wrote: Reason for CRM: Patient [phone] also would like to speak with nurse regarding test results, she knows the office has been corresponding through MyChart but patient prefers to speak with the nurse/NP [name]. Patient states has a yeast infection and would like a prescription to [facility] [address] [place] - [address] [place] [address] Phone:[phone]:[phone]

## 2023-04-14 ENCOUNTER — Ambulatory Visit
Admission: RE | Admit: 2023-04-14 | Discharge: 2023-04-14 | Disposition: A | Discharge status: Home / Self Care | Payer: Medicare HMO | Source: Ambulatory Visit | Attending: Primary Care | Admitting: Primary Care

## 2023-04-14 DIAGNOSIS — M8588 Other specified disorders of bone density and structure, other site: Secondary | ICD-10-CM | POA: Diagnosis not present

## 2023-04-14 DIAGNOSIS — E2839 Other primary ovarian failure: Secondary | ICD-10-CM | POA: Diagnosis not present

## 2023-04-14 DIAGNOSIS — N958 Other specified menopausal and perimenopausal disorders: Secondary | ICD-10-CM | POA: Diagnosis not present

## 2023-04-20 ENCOUNTER — Telehealth: Payer: Self-pay

## 2023-04-20 NOTE — Telephone Encounter (Signed)
 Noted.  Will evaluate.

## 2023-04-20 NOTE — Telephone Encounter (Signed)
Copied from CRM [id number]. Topic: Clinical - Medical Advice >> Apr 20, 2023 12:59 PM Almira Coaster wrote: Reason for CRM: Patient is calling because she finished taking fluconazole (DIFLUCAN) 150 MG tablet due to a yeast infection. Her symptoms have not gone away, she would like to know the next steps.  Called patient states that she took both doses of the diflucan. Felt like symptoms were getting better then last night she had to get up a few times with frequent urination with a strong stream. She does not have burning with urination but the most of her pain is in vaginal area. Denies any discharge or burning with urination. She is having some itching in vaginal area as well. I have set up patient for visit for tomorrow at 2:20. Reviewed red words with her and will get seen sooner if any.

## 2023-04-21 ENCOUNTER — Ambulatory Visit (INDEPENDENT_AMBULATORY_CARE_PROVIDER_SITE_OTHER): Payer: Medicare HMO | Admitting: Primary Care

## 2023-04-21 VITALS — BP 130/78 | HR 96 | Temp 97.6°F | Ht 59.5 in | Wt 135.0 lb

## 2023-04-21 DIAGNOSIS — N898 Other specified noninflammatory disorders of vagina: Secondary | ICD-10-CM

## 2023-04-21 DIAGNOSIS — N9489 Other specified conditions associated with female genital organs and menstrual cycle: Secondary | ICD-10-CM | POA: Insufficient documentation

## 2023-04-21 LAB — POC URINALSYSI DIPSTICK (AUTOMATED)
Bilirubin, UA: NEGATIVE
Blood, UA: NEGATIVE
Glucose, UA: NEGATIVE
Leukocytes, UA: NEGATIVE
Nitrite, UA: NEGATIVE
Protein, UA: POSITIVE — AB
Spec Grav, UA: >=1.03 — AB (ref 1.010–1.025)
Urobilinogen, UA: 0.2 U/dL
pH, UA: 5 (ref 5.0–8.0)

## 2023-04-21 NOTE — Patient Instructions (Signed)
Try a vaginal moisturizer for your vaginal dryness.  We will be in touch with the swab results.  It was a pleasure to see you today!

## 2023-04-21 NOTE — Assessment & Plan Note (Signed)
Differentials include vaginal atrophy with dryness, Candida.  Urinalysis today negative. Wet prep collected and pending.  Start vaginal moisturizer 3 times weekly for atrophy and dryness. Consider low-dose Estrace cream if needed.  She will update.

## 2023-04-21 NOTE — Progress Notes (Signed)
Subjective:    Patient ID: [name], female    DOB: 01-06-1941, 83 y.o.   MRN: [id number]  Vaginal Itching The patient's pertinent negatives include no pelvic pain or vaginal discharge. Pertinent negatives include no abdominal pain, dysuria, frequency or hematuria.    [name] is a very pleasant 83 y.o. female with a history of eosinophilic asthma, chronic back pain, urinary incontinence, prediabetes, urinary frequency who presents today to discuss vaginal itching.  Evaluated by me on 04/07/2023 for a 1 day history of vaginal irritation when wiping, vaginal itching, other urinary symptoms.  She had completed a course of doxycycline antibiotics 2 days prior for sinusitis.  Urinalysis with 3+ leuks, positive nitrites, no blood.  She was initiated on Macrobid twice daily x 5 days.  Urine culture returned negative so she was treated with fluconazole x 2 doses for presumed antibiotic induced vaginitis.  Today she discusses that her vaginal itching improved. Two days ago she developed vaginal burning to the lower bilateral labia minora. She denies vaginal itching, vaginal discharge, urinary frequency, dysuria.  She completed both tablets of the fluconazole medication.  She took 2 doses of her Macrobid before she discontinued due to GI upset.  Review of Systems  Gastrointestinal:  Negative for abdominal pain.  Genitourinary:  Positive for vaginal pain. Negative for difficulty urinating, dysuria, frequency, hematuria, pelvic pain and vaginal discharge.         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   Hearing loss    both ears, wwears hearing aides   Hyperlipidemia    Joint pain 08/06/2020   Laceration of head  05/07/2021   Lobar pneumonia (HCC) 02/19/2018   Macular degeneration    Malnutrition of moderate degree (HCC) 08/28/2014   Osteopenia    Pain of right lower extremity 06/21/2018   Peripheral neuralgia 08/18/2021   Pneumonia    Strain of cervical portion of both trapezius muscles 06/17/2022    Social History   Socioeconomic History   Marital status: Married    Spouse name: Not on file   Number of children: Not on file   Years of education: Not on file   Highest education level: Not on file  Occupational History   Occupation: retired  Tobacco Use   Smoking status: Never   Smokeless tobacco: Never  Vaping Use   Vaping status: Never Used  Substance and Sexual Activity   Alcohol use: Not Currently    Comment: occasionally   Drug use: No   Sexual activity: Not Currently  Other Topics Concern   Not on file  Social History Narrative   Married.   2 children, 6 grandchildren.   Retired. Previously was Runner, broadcasting/film/video.    Enjoys participating in church.    Social Drivers of Corporate investment banker Strain: Low Risk  (09/27/2022)   Overall Financial Resource Strain (CARDIA)    Difficulty of Paying Living Expenses: Not hard at all  Food Insecurity: No Food Insecurity (09/27/2022)   Hunger Vital Sign    Worried About Running Out of Food in the Last Year: Never true    Ran Out of Food in the Last Year: Never  true  Transportation Needs: No Transportation Needs (09/27/2022)   PRAPARE - Administrator, Civil Service (Medical): No    Lack of Transportation (Non-Medical): No  Physical Activity: Insufficiently Active (09/27/2022)   Exercise Vital Sign    Days of Exercise per Week: 7 days    Minutes of Exercise per Session: 10 min  Stress: No Stress Concern Present (09/27/2022)   [employer] of Occupational Health - Occupational Stress Questionnaire    Feeling of Stress : Not at all  Social Connections: Socially Integrated (09/27/2022)   Social Connection and Isolation Panel  [NHANES]    Frequency of Communication with Friends and Family: More than three times a week    Frequency of Social Gatherings with Friends and Family: Three times a week    Attends Religious Services: More than 4 times per year    Active Member of Clubs or Organizations: Yes    Attends Banker Meetings: 1 to 4 times per year    Marital Status: Married  Catering manager Violence: Not At Risk (09/27/2022)   Humiliation, Afraid, Rape, and Kick questionnaire    Fear of Current or Ex-Partner: No    Emotionally Abused: No    Physically Abused: No    Sexually Abused: No    Past Surgical History:  Procedure Laterality Date   ABDOMINAL HYSTERECTOMY     BREAST ENHANCEMENT SURGERY     CESAREAN SECTION     x 2   COLONOSCOPY WITH PROPOFOL N/A 04/21/2014   Procedure: COLONOSCOPY WITH PROPOFOL;  Surgeon: Charolett Bumpers, MD;  Location: [facility];  Service: Endoscopy;  Laterality: N/A;   EYE SURGERY Bilateral march 2015 and june 2015   lens for cataracts   LEFT HEART CATHETERIZATION WITH CORONARY ANGIOGRAM N/A 01/24/2012   Procedure: LEFT HEART CATHETERIZATION WITH CORONARY ANGIOGRAM;  Surgeon: Quintella Reichert, MD;  Location: [facility];  Service: Cardiovascular;  Laterality: N/A;   MASTECTOMY  33 yrs ago   bilateral    Family History  Problem Relation Age of Onset   Macular degeneration Mother    Heart attack Father     Allergies  Allergen Reactions   Fosamax [Alendronate Sodium] Nausea Only   Sulfonamide Derivatives Rash    Current Outpatient Medications on File Prior to Visit  Medication Sig Dispense Refill   albuterol (VENTOLIN HFA) 108 (90 Base) MCG/ACT inhaler Inhale 2 puffs into the lungs every 6 (six) hours as needed for wheezing or shortness of breath. 18 g 0   Cholecalciferol (VITAMIN D) 1000 UNITS capsule Take 1,000 Units by mouth daily with breakfast.      diclofenac (VOLTAREN) 75 MG EC tablet TAKE 1 TABLET BY MOUTH TWICE A DAY AS NEEDED FOR BACK PAIN 180  tablet 0   DUPIXENT 300 MG/2ML prefilled syringe INJECT 300MG  (1 SYRINGE) SUBCUTANEOUSLY EVERY OTHER WEEK. 12 mL 0   levocetirizine (XYZAL) 5 MG tablet TAKE 1 TABLET BY MOUTH EVERY DAY IN THE EVENING FOR ALLERGIES 90 tablet 2   montelukast (SINGULAIR) 10 MG tablet TAKE 1 TABLET (10 MG TOTAL) BY MOUTH AT BEDTIME. FOR ALLERGIES AND ASTHMA 90 tablet 2   Multiple Vitamins-Minerals (MULTI FOR HER 50+) TABS Take 1 tablet by mouth daily.     Multiple Vitamins-Minerals (PRESERVISION/LUTEIN) CAPS Take 1 capsule by mouth 2 (two) times daily.     raloxifene (EVISTA) 60 MG tablet TAKE 1 TABLET (60 MG TOTAL) BY MOUTH DAILY. FOR BONE HEALTH. 90 tablet 2   SYMBICORT 160-4.5 MCG/ACT  inhaler Inhale 2 puffs into the lungs 2 (two) times daily. 30.6 each 0   fluconazole (DIFLUCAN) 150 MG tablet Take 1 tablet by mouth now.  Take 1 tablet by mouth in 72 hours. (Patient not taking: Reported on 04/21/2023) 2 tablet 0   tiZANidine (ZANAFLEX) 4 MG tablet Take 1 tablet (4 mg total) by mouth every 8 (eight) hours as needed for muscle spasms. (Patient not taking: Reported on 04/21/2023) 15 tablet 0   No current facility-administered medications on file prior to visit.    BP 130/78 (BP Location: Left Arm, Patient Position: Sitting, Cuff Size: Normal)   Pulse 96   Temp 97.6 F (36.4 C) (Oral)   Ht 4' 11.5" (1.511 m)   Wt 135 lb (61.2 kg)   SpO2 98%   BMI 26.81 kg/m  Objective:   Physical Exam Exam conducted with a chaperone present.  Constitutional:      General: She is not in acute distress. Cardiovascular:     Rate and Rhythm: Normal rate.  Pulmonary:     Effort: Pulmonary effort is normal.  Genitourinary:    Labia:        Right: No rash, tenderness or lesion.        Left: No rash, tenderness or lesion.      Vagina: No vaginal discharge or erythema.     Cervix: No discharge or erythema.     Comments: Atrophy and dryness noted throughout labia majora, minora, vaginal canal          Assessment & Plan:   Vaginal burning Assessment & Plan: Differentials include vaginal atrophy with dryness, Candida.  Urinalysis today negative. Wet prep collected and pending.  Start vaginal moisturizer 3 times weekly for atrophy and dryness. Consider low-dose Estrace cream if needed.  She will update.  Orders: -     POCT Urinalysis Dipstick (Automated) -     WET PREP BY MOLECULAR PROBE  Vaginal itching -     POCT Urinalysis Dipstick (Automated) -     WET PREP BY MOLECULAR PROBE        Doreene Nest, NP

## 2023-04-22 LAB — WET PREP BY MOLECULAR PROBE
Candida species: NOT DETECTED
Gardnerella vaginalis: NOT DETECTED
MICRO NUMBER:: 15994691
SPECIMEN QUALITY:: ADEQUATE
Trichomonas vaginosis: NOT DETECTED

## 2023-04-26 DIAGNOSIS — H353221 Exudative age-related macular degeneration, left eye, with active choroidal neovascularization: Secondary | ICD-10-CM | POA: Diagnosis not present

## 2023-05-02 ENCOUNTER — Encounter: Payer: Self-pay | Admitting: Family

## 2023-05-02 ENCOUNTER — Telehealth (INDEPENDENT_AMBULATORY_CARE_PROVIDER_SITE_OTHER): Payer: Medicare HMO | Admitting: Family

## 2023-05-02 VITALS — BP 122/82 | HR 92 | Temp 98.2°F | Ht 59.5 in | Wt 134.4 lb

## 2023-05-02 DIAGNOSIS — M549 Dorsalgia, unspecified: Secondary | ICD-10-CM

## 2023-05-02 LAB — POCT URINALYSIS DIP (CLINITEK)
Bilirubin, UA: NEGATIVE
Blood, UA: NEGATIVE
Glucose, UA: NEGATIVE mg/dL
Ketones, POC UA: NEGATIVE mg/dL
Nitrite, UA: NEGATIVE
Spec Grav, UA: 1.025 (ref 1.010–1.025)
Urobilinogen, UA: 0.2 U/dL
pH, UA: 5 (ref 5.0–8.0)

## 2023-05-02 MED ORDER — TIZANIDINE HCL 4 MG PO TABS: 4.0000 mg | ORAL_TABLET | Freq: Three times a day (TID) | ORAL | 0 refills | Status: DC | PRN

## 2023-05-02 MED ORDER — PREDNISONE 20 MG PO TABS: ORAL_TABLET | ORAL | 0 refills | Status: DC

## 2023-05-02 NOTE — Assessment & Plan Note (Signed)
 Acute  Rx prednisone  20 mg twice daily x 4 days then once daily for 4 days Rx refill tizandine  advised caution as may cause drowsiness Stop diclofenac  while taking prednisone .  Heat to site, rest, massage as tolerated  Low suspicion for uti however since in flank area will evaluate with urine culture to r/o uti

## 2023-05-02 NOTE — Progress Notes (Signed)
 Virtual Visit via Video note  I connected with [name] on 05/02/23 at office by video and verified that I am speaking with the correct person using two identifiers.The provider, [name], FNP is located in their home at time of visit.  I discussed the limitations, risks, security and privacy concerns of performing an evaluation and management service by video and the availability of in person appointments. I also discussed with the patient that there may be a patient responsible charge related to this service. The patient expressed understanding and agreed to proceed.  Subjective: PCP: Kendra Comer POUR, NP  Chief Complaint  Patient presents with   Acute Visit    Back pain for several years.    HPI  Pt states that she has 'back problems' occasionally. She states that at times when she over does it as far as movement   She has pain right under the right shoulder blade, and two days ago she had a place where she could sit and press against her back it would provide relief. She woke up yesterday am and 'was fine' but throughout the day the pain started to come back. She ran out of muscle relaxers so she has not taken any, did take tylenol  with some slight improvement. Movement does aggravate it. She does take diclofenac  was taking bid and then has since went to once a day.   She denies any urinary frequency, urgency, and or dysuria.         ROS: Per HPI  Current Outpatient Medications:    albuterol  (VENTOLIN  HFA) 108 (90 Base) MCG/ACT inhaler, Inhale 2 puffs into the lungs every 6 (six) hours as needed for wheezing or shortness of breath., Disp: 18 g, Rfl: 0   Cholecalciferol (VITAMIN D ) 1000 UNITS capsule, Take 1,000 Units by mouth daily with breakfast. , Disp: , Rfl:    diclofenac  (VOLTAREN ) 75 MG EC tablet, TAKE 1 TABLET BY MOUTH TWICE A DAY AS NEEDED FOR BACK PAIN, Disp: 180 tablet, Rfl: 0   DUPIXENT  300 MG/2ML prefilled syringe, INJECT 300MG  (1 SYRINGE)  SUBCUTANEOUSLY EVERY OTHER WEEK., Disp: 12 mL, Rfl: 0   levocetirizine (XYZAL ) 5 MG tablet, TAKE 1 TABLET BY MOUTH EVERY DAY IN THE EVENING FOR ALLERGIES, Disp: 90 tablet, Rfl: 2   montelukast  (SINGULAIR ) 10 MG tablet, TAKE 1 TABLET (10 MG TOTAL) BY MOUTH AT BEDTIME. FOR ALLERGIES AND ASTHMA, Disp: 90 tablet, Rfl: 2   Multiple Vitamins-Minerals (MULTI FOR HER 50+) TABS, Take 1 tablet by mouth daily., Disp: , Rfl:    Multiple Vitamins-Minerals (PRESERVISION/LUTEIN) CAPS, Take 1 capsule by mouth 2 (two) times daily., Disp: , Rfl:    predniSONE  (DELTASONE ) 20 MG tablet, Take two tablets once daily for four days and then once daily for four days, Disp: 12 tablet, Rfl: 0   raloxifene  (EVISTA ) 60 MG tablet, TAKE 1 TABLET (60 MG TOTAL) BY MOUTH DAILY. FOR BONE HEALTH., Disp: 90 tablet, Rfl: 2   SYMBICORT  160-4.5 MCG/ACT inhaler, Inhale 2 puffs into the lungs 2 (two) times daily., Disp: 30.6 each, Rfl: 0   tiZANidine  (ZANAFLEX ) 4 MG tablet, Take 1 tablet (4 mg total) by mouth every 8 (eight) hours as needed for muscle spasms., Disp: 15 tablet, Rfl: 0  Observations/Objective: Physical Exam Constitutional:      General: She is not in acute distress.    Appearance: Normal appearance. She is normal weight. She is not ill-appearing, toxic-appearing or diaphoretic.  HENT:     Head: Normocephalic.  Cardiovascular:     Rate and Rhythm: Normal rate.  Pulmonary:     Effort: Pulmonary effort is normal.  Musculoskeletal:        General: Normal range of motion.       Arms:     Comments: Visually pt reports movement associated pain in above location on diagram  Neurological:     General: No focal deficit present.     Mental Status: She is alert and oriented to person, place, and time. Mental status is at baseline.  Psychiatric:        Mood and Affect: Mood normal.        Behavior: Behavior normal.        Thought Content: Thought content normal.        Judgment: Judgment normal.     Assessment and  Plan: Mid back pain Assessment & Plan: Acute  Rx prednisone  20 mg twice daily x 4 days then once daily for 4 days Rx refill tizandine  advised caution as may cause drowsiness Stop diclofenac  while taking prednisone .  Heat to site, rest, massage as tolerated  Low suspicion for uti however since in flank area will evaluate with urine culture to r/o uti  Orders: -     predniSONE ; Take two tablets once daily for four days and then once daily for four days  Dispense: 12 tablet; Refill: 0 -     Urine Culture -     tiZANidine  HCl; Take 1 tablet (4 mg total) by mouth every 8 (eight) hours as needed for muscle spasms.  Dispense: 15 tablet; Refill: 0 -     POCT URINALYSIS DIP (CLINITEK)    Follow Up Instructions: No follow-ups on file.   I discussed the assessment and treatment plan with the patient. The patient was provided an opportunity to ask questions and all were answered. The patient agreed with the plan and demonstrated an understanding of the instructions.   The patient was advised to call back or seek an in-person evaluation if the symptoms worsen or if the condition fails to improve as anticipated.  The above assessment and management plan was discussed with the patient. The patient verbalized understanding of and has agreed to the management plan. Patient is aware to call the clinic if symptoms persist or worsen. Patient is aware when to return to the clinic for a follow-up visit. Patient educated on when it is appropriate to go to the emergency department.     [name], MSN, APRN, FNP-C [facility]

## 2023-05-05 LAB — URINE CULTURE
MICRO NUMBER:: 16039139
SPECIMEN QUALITY:: ADEQUATE

## 2023-05-16 ENCOUNTER — Telehealth (HOSPITAL_BASED_OUTPATIENT_CLINIC_OR_DEPARTMENT_OTHER): Payer: Self-pay | Admitting: Pulmonary Disease

## 2023-05-16 DIAGNOSIS — J8283 Eosinophilic asthma: Secondary | ICD-10-CM

## 2023-05-16 NOTE — Telephone Encounter (Signed)
Patient called stating she just finished her last injection of Dupixent until she needs another injection in two weeks. Patient states our office has not sent forms needed to continue her enrollment and states someone is faxing the forms soon for our office to complete. Will be on the lookout for fax.  Patient is scheduled with an APP 06/05/23 currently the soonest opening.  Please advise

## 2023-05-22 NOTE — Telephone Encounter (Signed)
 Patient calling back regarding dupixent has not heard anything as of yet. Please advise.

## 2023-05-24 ENCOUNTER — Other Ambulatory Visit (HOSPITAL_COMMUNITY): Payer: Self-pay

## 2023-05-24 NOTE — Telephone Encounter (Signed)
 Pt returned call, per income amount reported she will not qualify for PAP. Discussed Medicare Prescription Payment Program with pt, she states that the estimated monthly amount is doable for her. Advised to call before the end of Feb to ensure that this month is taken into account and ensure the cheapest per-month cost currently available.   She will come to clinic tomorrow (2/27) to pick up sample pen.

## 2023-05-24 NOTE — Telephone Encounter (Signed)
 Attempted to contact pt to discuss, call went straight to VoiceMail. Left message requesting a return call, direct office number provided.

## 2023-05-25 ENCOUNTER — Telehealth: Payer: Self-pay

## 2023-05-25 MED ORDER — DUPIXENT 300 MG/2ML ~~LOC~~ SOSY: PREFILLED_SYRINGE | SUBCUTANEOUS | 0 refills | Status: DC

## 2023-05-25 NOTE — Telephone Encounter (Signed)
 PA will be initiated in separate encounter, will need to await updated chart notes from 06/05/23 appt prior to submission.

## 2023-05-25 NOTE — Telephone Encounter (Signed)
 Pt will need a PA in order to begin filling Dupixent at pharmacy level. Will need updated chart notes from appt on 06/05/23 in order to submit.

## 2023-05-25 NOTE — Telephone Encounter (Signed)
 Medication Samples have been provided to the patient.  Drug name: Dupixent 300mg /80mL pen injector Qty: 1 pen LOT: [id number] Exp.Date: 01/25/2025  Dosing instructions: Inject 300mg  into the skin every 14 days (due Monday, 05/28/2023). She will keep dose at room temperature until due on Monday  She states she's enrolled into Medicare Payment Plan already  [name], PharmD, MPH, BCPS, CPP Clinical Pharmacist (Rheumatology and Pulmonology)

## 2023-06-02 ENCOUNTER — Ambulatory Visit: Payer: Medicare HMO | Admitting: Adult Health

## 2023-06-05 ENCOUNTER — Telehealth: Payer: Self-pay | Admitting: *Deleted

## 2023-06-05 ENCOUNTER — Other Ambulatory Visit: Payer: Self-pay

## 2023-06-05 ENCOUNTER — Ambulatory Visit: Payer: Medicare HMO | Admitting: Adult Health

## 2023-06-05 ENCOUNTER — Encounter: Payer: Self-pay | Admitting: Adult Health

## 2023-06-05 ENCOUNTER — Ambulatory Visit

## 2023-06-05 DIAGNOSIS — J309 Allergic rhinitis, unspecified: Secondary | ICD-10-CM | POA: Diagnosis not present

## 2023-06-05 DIAGNOSIS — J439 Emphysema, unspecified: Secondary | ICD-10-CM | POA: Diagnosis not present

## 2023-06-05 DIAGNOSIS — J8283 Eosinophilic asthma: Secondary | ICD-10-CM | POA: Diagnosis not present

## 2023-06-05 DIAGNOSIS — J4550 Severe persistent asthma, uncomplicated: Secondary | ICD-10-CM | POA: Diagnosis not present

## 2023-06-05 DIAGNOSIS — J4520 Mild intermittent asthma, uncomplicated: Secondary | ICD-10-CM | POA: Diagnosis not present

## 2023-06-05 DIAGNOSIS — J45909 Unspecified asthma, uncomplicated: Secondary | ICD-10-CM | POA: Diagnosis not present

## 2023-06-05 DIAGNOSIS — J4540 Moderate persistent asthma, uncomplicated: Secondary | ICD-10-CM

## 2023-06-05 MED ORDER — DUPIXENT 300 MG/2ML ~~LOC~~ SOSY
PREFILLED_SYRINGE | SUBCUTANEOUS | 3 refills | Status: DC
  Filled 2023-06-08: qty 4, 28d supply, fill #0
  Filled 2023-07-03 (×2): qty 4, 28d supply, fill #1
  Filled 2023-08-17: qty 4, 28d supply, fill #2
  Filled 2023-09-19: qty 4, 28d supply, fill #3
  Filled 2023-10-12: qty 4, 28d supply, fill #4
  Filled 2023-11-09: qty 4, 28d supply, fill #5
  Filled 2023-12-06: qty 4, 28d supply, fill #6
  Filled 2024-01-04 – 2024-01-15 (×2): qty 4, 28d supply, fill #7

## 2023-06-05 MED ORDER — MONTELUKAST SODIUM 10 MG PO TABS: 10.0000 mg | ORAL_TABLET | Freq: Every day | ORAL | 2 refills | Status: DC

## 2023-06-05 MED ORDER — ALBUTEROL SULFATE HFA 108 (90 BASE) MCG/ACT IN AERS: 2.0000 | INHALATION_SPRAY | Freq: Four times a day (QID) | RESPIRATORY_TRACT | 3 refills | Status: AC | PRN

## 2023-06-05 MED ORDER — SYMBICORT 160-4.5 MCG/ACT IN AERO: 2.0000 | INHALATION_SPRAY | Freq: Two times a day (BID) | RESPIRATORY_TRACT | 11 refills | Status: DC

## 2023-06-05 MED ORDER — SYMBICORT 160-4.5 MCG/ACT IN AERO: 2.0000 | INHALATION_SPRAY | Freq: Two times a day (BID) | RESPIRATORY_TRACT | 4 refills | Status: AC

## 2023-06-05 MED ORDER — ALBUTEROL SULFATE HFA 108 (90 BASE) MCG/ACT IN AERS
2.0000 | INHALATION_SPRAY | Freq: Four times a day (QID) | RESPIRATORY_TRACT | 3 refills | Status: DC | PRN
Start: 2023-06-05 — End: 2023-06-05

## 2023-06-05 NOTE — Telephone Encounter (Signed)
 Good morning, This patient's Dupixent is requiring a PA.  Thank you.

## 2023-06-05 NOTE — Patient Instructions (Addendum)
 Continue on Symbicort 2 puffs Twice daily, rinse after use.  Albuterol inhaler As needed   Restart Dupixent injections every 2 weeks  Chest xray today  Follow up with Dr. Celine Mans in 6 months with PFT and As needed  -30 min visit.

## 2023-06-05 NOTE — Telephone Encounter (Signed)
 Rx Rheum/Pulm team is already addressing it in separate encounter, nothing further needed at this time.

## 2023-06-05 NOTE — Assessment & Plan Note (Signed)
 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  Patient has excellent control on current maintenance regimen with Symbicort, Singulair and Dupixent.  Prescriptions refills were sent to him.  Patient is continue with her current asthma action plan. Check chest x-ray today.  Check PFTs on return.  Plan  Patient Instructions  Continue on Symbicort 2 puffs Twice daily, rinse after use.  Albuterol inhaler As needed   Restart Dupixent injections every 2 weeks  Chest xray today  Follow up with Dr. Celine Mans in 6 months with PFT and As needed  -30 min visit.

## 2023-06-05 NOTE — Assessment & Plan Note (Signed)
 Well-controlled on current regimen. ?

## 2023-06-05 NOTE — Telephone Encounter (Signed)
 Pt arrived to collect sample, she informed me that she had encountered some issues with the Pen formulation and ended up losing most of the medication the last time she attempted to inject. She stated that she had previously utilized the PFS formulation and never had any issues with it and would prefer this over the autoinjector.   Pt provided with a box of the 300mg  PFS and I informed her that I would submit her new PA for the PFS rather than the AJ. Pt verbalized appreciation.  Exp: 10/2024 Lot#: [id number]  Will await completion of Dr. Rogers Seeds chart note before beginning PA process.

## 2023-06-05 NOTE — Progress Notes (Signed)
 @Patient  ID: [name], female    DOB: [birth year], 83 y.o.   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 Mild bronchiectasis on CT chest  Started on Dupixent in 2019.   TEST/EVENTS :  Spirometry 12/07/11>>1.31 (71%), FEV1% 66 Exhaled nitric oxide 12/07/11>>8 ppb (normal) PFT 12/05/12 >> FEV1 1.49 (82%), FEV1% 69, FEF 25-75% 1.09 (69%), TLC 4.38 (85%), DLCO 87%, borderline BD response Rt pleural fluid 08/08/14 >> glucose 59, protein <3, LDH 6800, WBC 16,640 (86%N), cytology negative for malignancy Rt pleural fluid 08/28/14 >> glucose 59, LDH 178, protein 3.5, WBC 303 (68%L) RAST 06/21/17 >> dust mites, IgE 14 PFT 08/23/17 >> FEV1 1.21 (73%), FEV1% 65, TLC 4.02 (91%), DLCO 71%, no BD   Chest Imaging:  CT chest 08/02/14 >> RML consolidation, small Rt pleural effusion CT chest 08/09/14 >> Rt lower lobe thin walled cavity, scattered GGO and interlobular septal thickening CT chest 09/18/14 >> mild Rt pleural thickening, small Rt pleural effusion, 4 mm nodule LUL, improved RML consolidation, mild RML BTX, RLL cavity 2.5 cm from 3.1 cm, mild BTX lingula CT chest 10/16/14 >> decreased RLL PNA w/o cavity CT chest 04/27/16 >> 4 mm LUL nodule stable, mild BTX and scarring b/l   Sleep Tests:      Cardiac Tests:  Lt heart cath 01/24/12 >> Normal coronaries, LVEDP 8 mmHg, EF 55% Echo 08/03/14 >> EF 50 to 55%, mild MR  06/05/2023 Follow up : Asthma  Patient presents for a 1 year follow-up.  Patient has severe persistent asthma with an allergic phenotype.  She says she does extremely well on her current regimen of Symbicort, Singulair and Dupixent.  She says Dupixent has made the biggest change in her life while order since starting her asthma has been so well-controlled.  She does need a refill of her Dupixent was recently  getting on patient assistance but now days is sent to local pharmacy as Medicare prescription coverage has changed.  She says she has been out of it for the last 2 weeks unfortunately was given a self injector and was unable to use correctly.  She is getting another self injector today in our pharmacy team- Until her prescription is refilled.  Patient says she remains active walks every day and does 10-minute exercises.  She is a never smoker.  Last chest x-ray and PFTs were few years ago.  Denies any albuterol use.  She says over the last week she has noticed some intermittent dry cough.  No fever or discolored mucus.      Allergies  Allergen Reactions   Fosamax [Alendronate Sodium] Nausea Only   Sulfonamide Derivatives Rash    Immunization History  Administered Date(s) Administered   Fluad Quad(high Dose 65+) 01/15/2019, 01/01/2020, 02/03/2022   Influenza Split 12/07/2011   Influenza Whole 03/11/2010   Influenza, High Dose Seasonal PF 11/25/2013, 02/15/2021, 01/24/2023   Influenza,inj,Quad PF,6+ Mos 12/26/2013, 12/02/2014, 01/05/2017, 01/16/2018   Influenza-Unspecified 12/07/2011   PFIZER(Purple Top)SARS-COV-2 Vaccination 04/20/2019, 05/12/2019, 01/27/2020   PNEUMOCOCCAL CONJUGATE-20 02/16/2023   Pneumococcal Conjugate-13 11/25/2013, 12/26/2013   Pneumococcal Polysaccharide-23 03/28/2004, 05/10/2006, 01/16/2018   Td 08/08/1999   Tdap 03/28/2009, 08/20/2009, 04/30/2021   Zoster Recombinant(Shingrix) 08/11/2020, 12/14/2020   Zoster, Live 03/28/2009, 08/20/2009    Past Medical History:  Diagnosis Date   Abdominal lymphadenopathy 08/06/2014   Abdominal pain  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   Arthritis    Asthma    Cancer of breast (HCC)    33 years ago, left,    COVID-19 05/11/2021   Dysuria 10/03/2019   Exercise-induced asthma 04/19/2012   Hearing loss    both ears, wwears hearing aides   Hyperlipidemia    Joint pain 08/06/2020   Laceration of head  05/07/2021   Lobar pneumonia (HCC) 02/19/2018   Macular degeneration    Malnutrition of moderate degree (HCC) 08/28/2014   Osteopenia    Pain of right lower extremity 06/21/2018   Peripheral neuralgia 08/18/2021   Pneumonia    Strain of cervical portion of both trapezius muscles 06/17/2022    Tobacco History: Social History   Tobacco Use  Smoking Status Never  Smokeless Tobacco Never   Counseling given: Not Answered   Outpatient Medications Prior to Visit  Medication Sig Dispense Refill   Cholecalciferol (VITAMIN D) 1000 UNITS capsule Take 1,000 Units by mouth daily with breakfast.      diclofenac (VOLTAREN) 75 MG EC tablet TAKE 1 TABLET BY MOUTH TWICE A DAY AS NEEDED FOR BACK PAIN 180 tablet 0   levocetirizine (XYZAL) 5 MG tablet TAKE 1 TABLET BY MOUTH EVERY DAY IN THE EVENING FOR ALLERGIES 90 tablet 2   Multiple Vitamins-Minerals (MULTI FOR HER 50+) TABS Take 1 tablet by mouth daily.     Multiple Vitamins-Minerals (PRESERVISION/LUTEIN) CAPS Take 1 capsule by mouth 2 (two) times daily.     raloxifene (EVISTA) 60 MG tablet TAKE 1 TABLET (60 MG TOTAL) BY MOUTH DAILY. FOR BONE HEALTH. 90 tablet 2   albuterol (VENTOLIN HFA) 108 (90 Base) MCG/ACT inhaler Inhale 2 puffs into the lungs every 6 (six) hours as needed for wheezing or shortness of breath. 18 g 0   DUPIXENT 300 MG/2ML prefilled syringe INJECT 300MG  (1 SYRINGE) SUBCUTANEOUSLY EVERY OTHER WEEK. 8 mL 0   montelukast (SINGULAIR) 10 MG tablet TAKE 1 TABLET (10 MG TOTAL) BY MOUTH AT BEDTIME. FOR ALLERGIES AND ASTHMA 90 tablet 2   predniSONE (DELTASONE) 20 MG tablet Take two tablets once daily for four days and then once daily for four days 12 tablet 0   SYMBICORT 160-4.5 MCG/ACT inhaler Inhale 2 puffs into the lungs 2 (two) times daily. 30.6 each 0   tiZANidine (ZANAFLEX) 4 MG tablet Take 1 tablet (4 mg total) by mouth every 8 (eight) hours as needed for muscle spasms. 15 tablet 0   No facility-administered medications prior to  visit.     Review of Systems:   Constitutional:   No  weight loss, night sweats,  Fevers, chills, fatigue, or  lassitude.  HEENT:   No headaches,  Difficulty swallowing,  Tooth/dental problems, or  Sore throat,                No sneezing, itching, ear ache, +nasal congestion, post nasal drip,   CV:  No chest pain,  Orthopnea, PND, swelling in lower extremities, anasarca, dizziness, palpitations, syncope.   GI  No heartburn, indigestion, abdominal pain, nausea, vomiting, diarrhea, change in bowel habits, loss of appetite, bloody stools.   Resp: .  No chest wall deformity  Skin: no rash or lesions.  GU: no dysuria, change in color of urine, no urgency or frequency.  No flank pain, no hematuria   MS:  No joint pain or swelling.  No decreased range of motion.  No back pain.    Physical Exam  BP (!) 147/84 (  BP Location: Right Arm, Patient Position: Sitting, Cuff Size: Large)   Pulse 93   Ht 4\' 11"  (1.499 m)   Wt 135 lb 12.8 oz (61.6 kg)   SpO2 98%   BMI 27.43 kg/m   GEN: A/Ox3; pleasant , NAD, well nourished    HEENT:  [place]/AT,  NOSE-clear, THROAT-clear, no lesions, no postnasal drip or exudate noted.   NECK:  Supple w/ fair ROM; no JVD; normal carotid impulses w/o bruits; no thyromegaly or nodules palpated; no lymphadenopathy.    RESP  Clear  P & A; w/o, wheezes/ rales/ or rhonchi. no accessory muscle use, no dullness to percussion  CARD:  RRR, no m/r/g, no peripheral edema, pulses intact, no cyanosis or clubbing.  GI:   Soft & nt; nml bowel sounds; no organomegaly or masses detected.   Musco: Warm bil, no deformities or joint swelling noted.   Neuro: alert, no focal deficits noted.    Skin: Warm, no lesions or rashes    Lab Results:  CBC   BMET  No results found.  Administration History     None          Latest Ref Rng & Units 08/23/2017    8:43 AM  PFT Results  FVC-Pre L 1.76   FVC-Predicted Pre % 79   FVC-Post L 1.87   FVC-Predicted Post % 84    Pre FEV1/FVC % % 67   Post FEV1/FCV % % 65   FEV1-Pre L 1.17   FEV1-Predicted Pre % 71   FEV1-Post L 1.21   DLCO uncorrected ml/min/mmHg 13.01   DLCO UNC% % 71   DLVA Predicted % 103   TLC L 4.02   TLC % Predicted % 91   RV % Predicted % 107     No results found for: "NITRICOXIDE"      Assessment & Plan:   Eosinophilic asthma (HCC) Severe persistent asthma with an allergic phenotype.  Patient has excellent control on current maintenance regimen with Symbicort, Singulair and Dupixent.  Prescriptions refills were sent to him.  Patient is continue with her current asthma action plan. Check chest x-ray today.  Check PFTs on return.  Plan  Patient Instructions  Continue on Symbicort 2 puffs Twice daily, rinse after use.  Albuterol inhaler As needed   Restart Dupixent injections every 2 weeks  Chest xray today  Follow up with Dr. Celine Mans in 6 months with PFT and As needed  -30 min visit.       Allergic rhinitis Well-controlled on current regimen.     Rubye Oaks, NP 06/05/2023

## 2023-06-06 NOTE — Telephone Encounter (Signed)
 Submitted an URGENT Prior Authorization request to [facility] for DUPIXENT via CoverMyMeds. Will update once we receive a response.  Key: Celesta Aver, PharmD, MPH, BCPS, CPP Clinical Pharmacist (Rheumatology and Pulmonology)

## 2023-06-06 NOTE — Telephone Encounter (Signed)
 Received notification from [facility] regarding a prior authorization for DUPIXENT PFS. Authorization has been APPROVED from 06/06/2023 to 03/27/2024. Approval letter sent to scan center.  Patient can fill through [facility]: [phone]   [name], PharmD, MPH, BCPS, CPP Clinical Pharmacist (Rheumatology and Pulmonology)

## 2023-06-07 ENCOUNTER — Other Ambulatory Visit: Payer: Self-pay | Admitting: Pharmacist

## 2023-06-07 NOTE — Progress Notes (Signed)
 Patient on Dupixent PFS for eosinophilic asthma. She started this many years ago and has been clinically stable  She will continue Dupixent 300mg  SQ every 14 days with Symbicort and montelukast 10mg  nightly  [name], PharmD, MPH, BCPS, CPP Clinical Pharmacist (Rheumatology and Pulmonology)

## 2023-06-08 ENCOUNTER — Other Ambulatory Visit (HOSPITAL_COMMUNITY): Payer: Self-pay

## 2023-06-08 ENCOUNTER — Other Ambulatory Visit: Payer: Self-pay

## 2023-06-08 NOTE — Progress Notes (Signed)
 Specialty Pharmacy Initial Fill Coordination Note  [name] is a 83 y.o. female contacted today regarding initial fill of specialty medication(s) Dupilumab (Dupixent)   Patient requested Delivery   Delivery date: 06/13/23   Verified address: [address]   [name] [address]   Medication will be filled on 06/12/23.   Patient is enrolled in the MPPP and is aware of $0 copayment.

## 2023-06-12 ENCOUNTER — Other Ambulatory Visit: Payer: Self-pay

## 2023-06-25 ENCOUNTER — Other Ambulatory Visit: Payer: Self-pay | Admitting: Primary Care

## 2023-06-25 DIAGNOSIS — J309 Allergic rhinitis, unspecified: Secondary | ICD-10-CM

## 2023-06-25 DIAGNOSIS — Z853 Personal history of malignant neoplasm of breast: Secondary | ICD-10-CM

## 2023-06-25 DIAGNOSIS — M8580 Other specified disorders of bone density and structure, unspecified site: Secondary | ICD-10-CM

## 2023-06-25 NOTE — Telephone Encounter (Signed)
Patient is due for CPE/follow up in late May, this will be required prior to any further refills.  Please schedule, thank you!   

## 2023-06-26 NOTE — Telephone Encounter (Signed)
 Patient has been scheduled

## 2023-06-27 ENCOUNTER — Other Ambulatory Visit (HOSPITAL_COMMUNITY): Payer: Self-pay

## 2023-06-28 ENCOUNTER — Other Ambulatory Visit: Payer: Self-pay

## 2023-07-03 ENCOUNTER — Other Ambulatory Visit: Payer: Self-pay

## 2023-07-03 ENCOUNTER — Other Ambulatory Visit (HOSPITAL_COMMUNITY): Payer: Self-pay

## 2023-07-03 NOTE — Progress Notes (Signed)
 Specialty Pharmacy Refill Coordination Note  [name] is a 83 y.o. female contacted today regarding refills of specialty medication(s) Dupilumab (Dupixent)   Patient requested Delivery   Delivery date: 07/11/23   Verified address: [address]   [name] [address]   Medication will be filled on 07/10/23.

## 2023-07-10 ENCOUNTER — Other Ambulatory Visit: Payer: Self-pay

## 2023-08-11 ENCOUNTER — Telehealth: Payer: Self-pay | Admitting: *Deleted

## 2023-08-11 NOTE — Telephone Encounter (Signed)
 Copied from CRM [id number]. Topic: Appointments - Appointment Scheduling >> Jul 28, 2023  9:18 AM Justina Oman C wrote: Patient/patient representative is calling to schedule an appointment. Refer to attachments for appointment information. >> Jul 28, 2023 12:41 PM Barnet Boots wrote: LVMTCB to schedule PFT. >> Jul 28, 2023  9:29 AM Ilene Malling wrote: Cannot schedule PFT appointment for patient. System states "deny scheduling, please send a message to [facility] pool as no PFT order has been placed for the patient." Please call back at [phone].   6th duplicate.  Closing encounter.

## 2023-08-11 NOTE — Telephone Encounter (Signed)
 Copied from CRM [id number]. Topic: Appointments - Appointment Scheduling >> Jul 28, 2023  9:18 AM Justina Oman C wrote: Patient/patient representative is calling to schedule an appointment. Refer to attachments for appointment information. >> Jul 28, 2023 12:41 PM Barnet Boots wrote: LVMTCB to schedule PFT. >> Jul 28, 2023  9:29 AM Ilene Malling wrote: Cannot schedule PFT appointment for patient. System states "deny scheduling, please send a message to [facility] pool as no PFT order has been placed for the patient." Please call back at [phone].   3rd message, duplicate. Closing encounter.

## 2023-08-11 NOTE — Telephone Encounter (Signed)
 Copied from CRM [id number]. Topic: Appointments - Appointment Scheduling >> Jul 28, 2023  9:18 AM Justina Oman C wrote: Patient/patient representative is calling to schedule an appointment. Refer to attachments for appointment information. >> Jul 28, 2023 12:41 PM Barnet Boots wrote: LVMTCB to schedule PFT. >> Jul 28, 2023  9:29 AM Ilene Malling wrote: Cannot schedule PFT appointment for patient. System states "deny scheduling, please send a message to [facility] pool as no PFT order has been placed for the patient." Please call back at [phone].   duplicate.  Closing encounter.

## 2023-08-11 NOTE — Telephone Encounter (Signed)
 Copied from CRM [id number]. Topic: Appointments - Appointment Scheduling >> Jul 28, 2023  9:18 AM Justina Oman C wrote: Patient/patient representative is calling to schedule an appointment. Refer to attachments for appointment information. >> Jul 28, 2023 12:41 PM Barnet Boots wrote: LVMTCB to schedule PFT. >> Jul 28, 2023  9:29 AM Ilene Malling wrote: Cannot schedule PFT appointment for patient. System states "deny scheduling, please send a message to [facility] pool as no PFT order has been placed for the patient." Please call back at [phone].   5th duplicate.  Closing encounter.

## 2023-08-11 NOTE — Telephone Encounter (Signed)
 Copied from CRM [id number]. Topic: Appointments - Appointment Scheduling >> Jul 28, 2023  9:18 AM Justina Oman C wrote: Patient/patient representative is calling to schedule an appointment. Refer to attachments for appointment information. >> Jul 28, 2023 12:41 PM Barnet Boots wrote: LVMTCB to schedule PFT. >> Jul 28, 2023  9:29 AM Ilene Malling wrote: Cannot schedule PFT appointment for patient. System states "deny scheduling, please send a message to [facility] pool as no PFT order has been placed for the patient." Please call back at [phone].   Duplicate, closing encounter.

## 2023-08-11 NOTE — Telephone Encounter (Signed)
 Copied from CRM [id number]. Topic: Appointments - Appointment Scheduling >> Jul 28, 2023  9:18 AM Justina Oman C wrote: Patient/patient representative is calling to schedule an appointment. Refer to attachments for appointment information. >> Jul 28, 2023 12:41 PM Barnet Boots wrote: LVMTCB to schedule PFT. >> Jul 28, 2023  9:29 AM Ilene Malling wrote: Cannot schedule PFT appointment for patient. System states "deny scheduling, please send a message to [facility] pool as no PFT order has been placed for the patient." Please call back at [phone].   4th duplicate.  Closing encounter.

## 2023-08-11 NOTE — Telephone Encounter (Signed)
 Copied from CRM [id number]. Topic: Appointments - Appointment Scheduling >> Jul 28, 2023  9:18 AM Justina Oman C wrote: Patient/patient representative is calling to schedule an appointment. Refer to attachments for appointment information. >> Jul 28, 2023 12:41 PM Barnet Boots wrote: LVMTCB to schedule PFT. >> Jul 28, 2023  9:29 AM Ilene Malling wrote: Cannot schedule PFT appointment for patient. System states "deny scheduling, please send a message to [facility] pool as no PFT order has been placed for the patient." Please call back at [phone].   Front staff:  Please schedule patient for PFT.  Thank you.

## 2023-08-15 ENCOUNTER — Telehealth: Payer: Self-pay | Admitting: *Deleted

## 2023-08-15 NOTE — Telephone Encounter (Signed)
 Copied from CRM [id number]. Topic: Appointments - Appointment Scheduling >> Jul 28, 2023  9:18 AM Justina Oman C wrote: Patient/patient representative is calling to schedule an appointment. Refer to attachments for appointment information. >> Jul 28, 2023 12:41 PM Barnet Boots wrote: LVMTCB to schedule PFT. >> Jul 28, 2023  9:29 AM Ilene Malling wrote: Cannot schedule PFT appointment for patient. System states "deny scheduling, please send a message to [facility] pool as no PFT order has been placed for the patient." Please call back at [phone].   Duplicate.  Closing encounter.

## 2023-08-15 NOTE — Telephone Encounter (Signed)
 Attempted to call patient. Left voicemail for patient to call our office back to schedule PFT.

## 2023-08-16 NOTE — Telephone Encounter (Signed)
 LM for PT. May have 8/7 appt for both PFT and OV.

## 2023-08-17 ENCOUNTER — Other Ambulatory Visit: Payer: Self-pay

## 2023-08-17 ENCOUNTER — Ambulatory Visit: Payer: Self-pay | Admitting: Primary Care

## 2023-08-17 ENCOUNTER — Encounter: Payer: Self-pay | Admitting: Primary Care

## 2023-08-17 ENCOUNTER — Ambulatory Visit (INDEPENDENT_AMBULATORY_CARE_PROVIDER_SITE_OTHER): Admitting: Primary Care

## 2023-08-17 VITALS — BP 112/70 | HR 81 | Temp 97.9°F | Ht 59.0 in | Wt 136.0 lb

## 2023-08-17 DIAGNOSIS — R7303 Prediabetes: Secondary | ICD-10-CM

## 2023-08-17 DIAGNOSIS — H3530 Unspecified macular degeneration: Secondary | ICD-10-CM | POA: Diagnosis not present

## 2023-08-17 DIAGNOSIS — Z0000 Encounter for general adult medical examination without abnormal findings: Secondary | ICD-10-CM | POA: Diagnosis not present

## 2023-08-17 DIAGNOSIS — E782 Mixed hyperlipidemia: Secondary | ICD-10-CM

## 2023-08-17 DIAGNOSIS — M5441 Lumbago with sciatica, right side: Secondary | ICD-10-CM

## 2023-08-17 DIAGNOSIS — G8929 Other chronic pain: Secondary | ICD-10-CM

## 2023-08-17 DIAGNOSIS — M8580 Other specified disorders of bone density and structure, unspecified site: Secondary | ICD-10-CM | POA: Diagnosis not present

## 2023-08-17 DIAGNOSIS — J8283 Eosinophilic asthma: Secondary | ICD-10-CM | POA: Diagnosis not present

## 2023-08-17 LAB — COMPREHENSIVE METABOLIC PANEL WITH GFR
ALT: 15 U/L (ref 0–35)
AST: 15 U/L (ref 0–37)
Albumin: 3.9 g/dL (ref 3.5–5.2)
Alkaline Phosphatase: 66 U/L (ref 39–117)
BUN: 20 mg/dL (ref 6–23)
CO2: 28 meq/L (ref 19–32)
Calcium: 8.6 mg/dL (ref 8.4–10.5)
Chloride: 109 meq/L (ref 96–112)
Creatinine, Ser: 0.51 mg/dL (ref 0.40–1.20)
GFR: 86.92 mL/min (ref >60.00)
Glucose, Bld: 89 mg/dL (ref 70–99)
Potassium: 3.6 meq/L (ref 3.5–5.1)
Sodium: 142 meq/L (ref 135–145)
Total Bilirubin: 0.4 mg/dL (ref 0.2–1.2)
Total Protein: 6.1 g/dL (ref 6.0–8.3)

## 2023-08-17 LAB — LIPID PANEL
Cholesterol: 188 mg/dL (ref 0–200)
HDL: 36.7 mg/dL — ABNORMAL LOW (ref >39.00)
LDL Cholesterol: 105 mg/dL — ABNORMAL HIGH (ref 0–99)
NonHDL: 151.24
Total CHOL/HDL Ratio: 5
Triglycerides: 233 mg/dL — ABNORMAL HIGH (ref 0.0–149.0)
VLDL: 46.6 mg/dL — ABNORMAL HIGH (ref 0.0–40.0)

## 2023-08-17 LAB — HEMOGLOBIN A1C: Hgb A1c MFr Bld: 5.9 % (ref 4.6–6.5)

## 2023-08-17 NOTE — Assessment & Plan Note (Signed)
 Stable.  Continue diclofenac  75 mg daily PRN.

## 2023-08-17 NOTE — Progress Notes (Signed)
 Specialty Pharmacy Refill Coordination Note  [name] is a 83 y.o. female contacted today regarding refills of specialty medication(s) Dupilumab  (Dupixent )   Patient requested Delivery   Delivery date: 08/23/23   Verified address: [address]   [name] [address]   Medication will be filled on 08/22/23.

## 2023-08-17 NOTE — Assessment & Plan Note (Addendum)
 Stable.  Following with pulmonology, reviewed office notes from March 2025.  Continue Symbicort  160-4.5 mcg 2 puffs BID, albuterol  inhaler PRN.  Continue Dupixent  300 mg every other week. Continue Singulair  10 mg HS, Xyzal  5 mg daily.

## 2023-08-17 NOTE — Assessment & Plan Note (Signed)
 Repeat lipid panel pending.

## 2023-08-17 NOTE — Assessment & Plan Note (Signed)
Bone density scan UTD. Continue Evista 60 mg daily.  Continue calcium and vitamin D.

## 2023-08-17 NOTE — Assessment & Plan Note (Signed)
Immunizations UTD. Bone density scan UTD Mammogram declined given age Colonoscopy N/A given age  Discussed the importance of a healthy diet and regular exercise in order for weight loss, and to reduce the risk of further co-morbidity.  Exam stable. Labs pending.  Follow up in 1 year for repeat physical.

## 2023-08-17 NOTE — Progress Notes (Signed)
 Subjective:    Patient ID: [name], female    DOB: [birth year], 83 y.o.   MRN: [id number]  HPI  [name] is a very pleasant 83 y.o. female who presents today for complete physical and follow up of chronic conditions.  Immunizations: -Tetanus: Completed in 2023  -Shingles: Completed Shingrix series -Pneumonia: Completed Prevnar 20 in 2024, Prevnar 13 in 2015, Pneumovax 23 in 2019  Diet: Fair diet.  Exercise: No regular exercise.  Eye exam: Completes annually  Dental exam: Completes semi-annually    Mammogram: Completed in 2020 Bone Density Scan: Completed in January 2025  Colonoscopy: Completed in 2016, no further screening given age  BP Readings from Last 3 Encounters:  08/17/23 112/70  06/05/23 (!) 147/84  05/02/23 122/82       Review of Systems  Constitutional:  Negative for unexpected weight change.  HENT:  Negative for rhinorrhea.   Respiratory:  Negative for cough and shortness of breath.   Cardiovascular:  Negative for chest pain.  Gastrointestinal:  Negative for constipation and diarrhea.  Genitourinary:  Negative for difficulty urinating.  Musculoskeletal:  Positive for arthralgias.  Skin:  Negative for rash.  Allergic/Immunologic: Negative for environmental allergies.  Neurological:  Negative for headaches.  Psychiatric/Behavioral:  The patient is not nervous/anxious.          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   Hearing loss    both ears, wwears hearing aides   Hyperlipidemia    Joint pain 08/06/2020   Laceration of head 05/07/2021   Lobar pneumonia (HCC) 02/19/2018   Macular degeneration    Malnutrition of moderate degree (HCC) 08/28/2014   Osteopenia     Pain of right lower extremity 06/21/2018   Peripheral neuralgia 08/18/2021   Pneumonia    Strain of cervical portion of both trapezius muscles 06/17/2022    Social History   Socioeconomic History   Marital status: Married    Spouse name: Not on file   Number of children: Not on file   Years of education: Not on file   Highest education level: Not on file  Occupational History   Occupation: retired  Tobacco Use   Smoking status: Never   Smokeless tobacco: Never  Vaping Use   Vaping status: Never Used  Substance and Sexual Activity   Alcohol use: Not Currently    Comment: occasionally   Drug use: No   Sexual activity: Not Currently  Other Topics Concern   Not on file  Social History Narrative   Married.   2 children, 6 grandchildren.   Retired. Previously was Runner, broadcasting/film/video.    Enjoys participating in church.    Social Drivers of Corporate investment banker Strain: Low Risk  (09/27/2022)   Overall Financial Resource Strain (CARDIA)    Difficulty of Paying Living Expenses: Not hard at all  Food Insecurity: No Food Insecurity (09/27/2022)   Hunger Vital Sign    Worried About Running Out of Food in the Last Year: Never true    Ran Out of Food in the Last Year: Never true  Transportation Needs: No Transportation Needs (09/27/2022)   PRAPARE - Transportation    Lack  of Transportation (Medical): No    Lack of Transportation (Non-Medical): No  Physical Activity: Insufficiently Active (09/27/2022)   Exercise Vital Sign    Days of Exercise per Week: 7 days    Minutes of Exercise per Session: 10 min  Stress: No Stress Concern Present (09/27/2022)   [employer] of Occupational Health - Occupational Stress Questionnaire    Feeling of Stress : Not at all  Social Connections: Socially Integrated (09/27/2022)   Social Connection and Isolation Panel [NHANES]    Frequency of Communication with Friends and Family: More than three times a week    Frequency of Social Gatherings with Friends and  Family: Three times a week    Attends Religious Services: More than 4 times per year    Active Member of Clubs or Organizations: Yes    Attends Banker Meetings: 1 to 4 times per year    Marital Status: Married  Catering manager Violence: Not At Risk (09/27/2022)   Humiliation, Afraid, Rape, and Kick questionnaire    Fear of Current or Ex-Partner: No    Emotionally Abused: No    Physically Abused: No    Sexually Abused: No    Past Surgical History:  Procedure Laterality Date   ABDOMINAL HYSTERECTOMY     BREAST ENHANCEMENT SURGERY     CESAREAN SECTION     x 2   COLONOSCOPY WITH PROPOFOL  N/A 04/21/2014   Procedure: COLONOSCOPY WITH PROPOFOL ;  Surgeon: Garrett Kallman, MD;  Location: [facility];  Service: Endoscopy;  Laterality: N/A;   EYE SURGERY Bilateral march 2015 and june 2015   lens for cataracts   LEFT HEART CATHETERIZATION WITH CORONARY ANGIOGRAM N/A 01/24/2012   Procedure: LEFT HEART CATHETERIZATION WITH CORONARY ANGIOGRAM;  Surgeon: Jacqueline Matsu, MD;  Location: [facility];  Service: Cardiovascular;  Laterality: N/A;   MASTECTOMY  33 yrs ago   bilateral    Family History  Problem Relation Age of Onset   Macular degeneration Mother    Heart attack Father     Allergies  Allergen Reactions   Fosamax [Alendronate Sodium] Nausea Only   Sulfonamide Derivatives Rash    Current Outpatient Medications on File Prior to Visit  Medication Sig Dispense Refill   albuterol  (VENTOLIN  HFA) 108 (90 Base) MCG/ACT inhaler Inhale 2 puffs into the lungs every 6 (six) hours as needed for wheezing or shortness of breath. 3 each 3   Cholecalciferol (VITAMIN D ) 1000 UNITS capsule Take 1,000 Units by mouth daily with breakfast.      diclofenac  (VOLTAREN ) 75 MG EC tablet TAKE 1 TABLET BY MOUTH TWICE A DAY AS NEEDED FOR BACK PAIN 180 tablet 0   DUPIXENT  300 MG/2ML prefilled syringe INJECT 300MG  (1 SYRINGE) SUBCUTANEOUSLY EVERY OTHER WEEK. 8 mL 3   levocetirizine (XYZAL ) 5 MG  tablet TAKE 1 TABLET BY MOUTH EVERY DAY IN THE EVENING FOR ALLERGIES 90 tablet 0   montelukast  (SINGULAIR ) 10 MG tablet Take 1 tablet (10 mg total) by mouth at bedtime. For allergies and asthma 90 tablet 2   Multiple Vitamins-Minerals (MULTI FOR HER 50+) TABS Take 1 tablet by mouth daily.     Multiple Vitamins-Minerals (PRESERVISION/LUTEIN) CAPS Take 1 capsule by mouth 2 (two) times daily.     raloxifene  (EVISTA ) 60 MG tablet TAKE 1 TABLET (60 MG TOTAL) BY MOUTH DAILY. FOR BONE HEALTH. 90 tablet 0   SYMBICORT  160-4.5 MCG/ACT inhaler Inhale 2 puffs into the lungs 2 (two) times daily. 3 each 4   No  current facility-administered medications on file prior to visit.    BP 112/70   Pulse 81   Temp 97.9 F (36.6 C) (Temporal)   Ht 4\' 11"  (1.499 m)   Wt 136 lb (61.7 kg)   SpO2 98%   BMI 27.47 kg/m  Objective:   Physical Exam HENT:     Right Ear: Tympanic membrane and ear canal normal.     Left Ear: Tympanic membrane and ear canal normal.  Eyes:     Pupils: Pupils are equal, round, and reactive to light.  Cardiovascular:     Rate and Rhythm: Normal rate and regular rhythm.  Pulmonary:     Effort: Pulmonary effort is normal.     Breath sounds: Normal breath sounds.  Abdominal:     General: Bowel sounds are normal.     Palpations: Abdomen is soft.     Tenderness: There is no abdominal tenderness.  Musculoskeletal:        General: Normal range of motion.     Cervical back: Neck supple.  Skin:    General: Skin is warm and dry.  Neurological:     Mental Status: She is alert and oriented to person, place, and time.     Cranial Nerves: No cranial nerve deficit.     Deep Tendon Reflexes:     Reflex Scores:      Patellar reflexes are 2+ on the right side and 2+ on the left side. Psychiatric:        Mood and Affect: Mood normal.           Assessment & Plan:  Preventative health care Assessment & Plan: Immunizations UTD. Bone density scan UTD Mammogram declined given  age Colonoscopy N/A given age  Discussed the importance of a healthy diet and regular exercise in order for weight loss, and to reduce the risk of further co-morbidity.  Exam stable. Labs pending.  Follow up in 1 year for repeat physical.    Eosinophilic asthma Assessment & Plan: Stable.  Following with pulmonology, reviewed office notes from March 2025.  Continue Symbicort  160-4.5 mcg 2 puffs BID, albuterol  inhaler PRN.  Continue Dupixent  300 mg every other week. Continue Singulair  10 mg HS, Xyzal  5 mg daily.   Osteopenia, unspecified location Assessment & Plan: Bone density scan UTD. Continue Evista  60 mg daily.   Continue calcium and vitamin D .   Chronic bilateral low back pain with right-sided sciatica Assessment & Plan: Stable.  Continue diclofenac  75 mg daily PRN.   Mixed hyperlipidemia Assessment & Plan: Repeat lipid panel pending.   Orders: -     Lipid panel -     Comprehensive metabolic panel with GFR  Macular degeneration of left eye, unspecified type Assessment & Plan: Following with ophthalmology. Continue injections.    Prediabetes Assessment & Plan: Repeat A1C pending.  Orders: -     Hemoglobin A1c        Gabriel John, NP

## 2023-08-17 NOTE — Assessment & Plan Note (Signed)
Following with ophthalmology.  Continue injections.  

## 2023-08-17 NOTE — Patient Instructions (Signed)
 Stop by the lab prior to leaving today. I will notify you of your results once received.   It was a pleasure to see you today!

## 2023-08-17 NOTE — Assessment & Plan Note (Signed)
 Repeat A1C pending.

## 2023-08-18 NOTE — Telephone Encounter (Signed)
 Patient is scheduled for PFT and OV on 11/02/2023

## 2023-08-22 ENCOUNTER — Other Ambulatory Visit: Payer: Self-pay

## 2023-08-23 DIAGNOSIS — H353221 Exudative age-related macular degeneration, left eye, with active choroidal neovascularization: Secondary | ICD-10-CM | POA: Diagnosis not present

## 2023-08-28 ENCOUNTER — Other Ambulatory Visit (HOSPITAL_COMMUNITY): Payer: Self-pay

## 2023-08-29 DIAGNOSIS — L821 Other seborrheic keratosis: Secondary | ICD-10-CM | POA: Diagnosis not present

## 2023-08-29 DIAGNOSIS — D225 Melanocytic nevi of trunk: Secondary | ICD-10-CM | POA: Diagnosis not present

## 2023-08-29 DIAGNOSIS — L820 Inflamed seborrheic keratosis: Secondary | ICD-10-CM | POA: Diagnosis not present

## 2023-08-29 DIAGNOSIS — C44319 Basal cell carcinoma of skin of other parts of face: Secondary | ICD-10-CM | POA: Diagnosis not present

## 2023-09-19 ENCOUNTER — Other Ambulatory Visit: Payer: Self-pay

## 2023-09-19 NOTE — Progress Notes (Signed)
 Specialty Pharmacy Refill Coordination Note  [name] is a 83 y.o. female contacted today regarding refills of specialty medication(s) Dupilumab  (Dupixent )   Patient requested Delivery   Delivery date: 09/21/23   Verified address: [address]   [place] [address]   Medication will be filled on 09/20/23.

## 2023-09-24 ENCOUNTER — Other Ambulatory Visit: Payer: Self-pay | Admitting: Primary Care

## 2023-09-24 DIAGNOSIS — M8580 Other specified disorders of bone density and structure, unspecified site: Secondary | ICD-10-CM

## 2023-09-24 DIAGNOSIS — J309 Allergic rhinitis, unspecified: Secondary | ICD-10-CM

## 2023-09-24 DIAGNOSIS — Z853 Personal history of malignant neoplasm of breast: Secondary | ICD-10-CM

## 2023-10-10 ENCOUNTER — Other Ambulatory Visit (HOSPITAL_COMMUNITY): Payer: Self-pay

## 2023-10-12 ENCOUNTER — Other Ambulatory Visit: Payer: Self-pay

## 2023-10-12 ENCOUNTER — Other Ambulatory Visit: Payer: Self-pay | Admitting: Pharmacy Technician

## 2023-10-12 NOTE — Progress Notes (Signed)
 Specialty Pharmacy Refill Coordination Note  [name] is a 83 y.o. female contacted today regarding refills of specialty medication(s) Dupilumab  (Dupixent )   Patient requested Delivery   Delivery date: 10/18/23   Verified address: [address]   [place] [address]   Medication will be filled on 10/17/23.

## 2023-10-15 ENCOUNTER — Encounter (INDEPENDENT_AMBULATORY_CARE_PROVIDER_SITE_OTHER): Payer: Self-pay

## 2023-10-16 ENCOUNTER — Other Ambulatory Visit: Payer: Self-pay

## 2023-10-17 ENCOUNTER — Other Ambulatory Visit: Payer: Self-pay

## 2023-10-17 ENCOUNTER — Ambulatory Visit (INDEPENDENT_AMBULATORY_CARE_PROVIDER_SITE_OTHER)

## 2023-10-17 VITALS — BP 112/70 | Ht 59.5 in | Wt 131.0 lb

## 2023-10-17 DIAGNOSIS — C44319 Basal cell carcinoma of skin of other parts of face: Secondary | ICD-10-CM | POA: Diagnosis not present

## 2023-10-17 DIAGNOSIS — Z0000 Encounter for general adult medical examination without abnormal findings: Secondary | ICD-10-CM | POA: Diagnosis not present

## 2023-10-17 NOTE — Progress Notes (Signed)
 Because this visit was a virtual/telehealth visit,  certain criteria was not obtained, such a blood pressure, CBG if applicable, and timed get up and go. Any medications not marked as taking were not mentioned during the medication reconciliation part of the visit. Any vitals not documented were not able to be obtained due to this being a telehealth visit or patient was unable to self-report a recent blood pressure reading due to a lack of equipment at home via telehealth. Vitals that have been documented are verbally provided by the patient.  This visit was performed by a medical professional under my direct supervision. I was immediately available for consultation/collaboration. I have reviewed and agree with the Annual Wellness Visit documentation.  Subjective:   [name] is a 83 y.o. who presents for a Medicare Wellness preventive visit.  As a reminder, Annual Wellness Visits don't include a physical exam, and some assessments may be limited, especially if this visit is performed virtually. We may recommend an in-person follow-up visit with your provider if needed.  Visit Complete: Virtual I connected with  [name] on 10/17/23 by a audio enabled telemedicine application and verified that I am speaking with the correct person using two identifiers.  Patient Location: Home  Provider Location: Home Office  I discussed the limitations of evaluation and management by telemedicine. The patient expressed understanding and agreed to proceed.  Vital Signs: Because this visit was a virtual/telehealth visit, some criteria may be missing or patient reported. Any vitals not documented were not able to be obtained and vitals that have been documented are patient reported.  VideoDeclined- This patient declined Librarian, academic. Therefore the visit was completed with audio only.  Persons Participating in Visit: Patient.  AWV Questionnaire: Yes: Patient Medicare  AWV questionnaire was completed by the patient on 10/15/2023; I have confirmed that all information answered by patient is correct and no changes since this date.  Cardiac Risk Factors include: advanced age (>23men, >41 women);dyslipidemia     Objective:    Today's Vitals   10/17/23 1144  BP: 112/70  Weight: 131 lb (59.4 kg)  Height: 4' 11.5 (1.511 m)   Body mass index is 26.02 kg/m.     10/17/2023   11:44 AM 09/27/2022    9:26 AM 07/26/2021    2:37 PM 07/24/2020    2:48 PM 06/10/2019   10:34 AM 06/06/2018    9:26 AM 05/31/2017   10:26 AM  Advanced Directives  Does Patient Have a Medical Advance Directive? Yes No No No No No  No   Type of Estate agent of [place];Living will        Does patient want to make changes to medical advance directive? No - Patient declined      Yes (MAU/Ambulatory/Procedural Areas - Information given)   Copy of Healthcare Power of Attorney in Chart? Yes - validated most recent copy scanned in chart (See row information)        Would patient like information on creating a medical advance directive?  Yes (MAU/Ambulatory/Procedural Areas - Information given)  No - Patient declined Yes (MAU/Ambulatory/Procedural Areas - Information given) Yes (MAU/Ambulatory/Procedural Areas - Information given)       Data saved with a previous flowsheet row definition    Current Medications (verified) Outpatient Encounter Medications as of 10/17/2023  Medication Sig   albuterol  (VENTOLIN  HFA) 108 (90 Base) MCG/ACT inhaler Inhale 2 puffs into the lungs every 6 (six) hours as needed  for wheezing or shortness of breath.   Cholecalciferol (VITAMIN D ) 1000 UNITS capsule Take 1,000 Units by mouth daily with breakfast.    diclofenac  (VOLTAREN ) 75 MG EC tablet TAKE 1 TABLET BY MOUTH TWICE A DAY AS NEEDED FOR BACK PAIN   DUPIXENT  300 MG/2ML prefilled syringe INJECT 300MG  (1 SYRINGE) SUBCUTANEOUSLY EVERY OTHER WEEK.   levocetirizine (XYZAL ) 5 MG tablet TAKE 1 TABLET  BY MOUTH EVERY DAY IN THE EVENING FOR ALLERGIES   montelukast  (SINGULAIR ) 10 MG tablet Take 1 tablet (10 mg total) by mouth at bedtime. For allergies and asthma   Multiple Vitamins-Minerals (MULTI FOR HER 50+) TABS Take 1 tablet by mouth daily.   Multiple Vitamins-Minerals (PRESERVISION/LUTEIN) CAPS Take 1 capsule by mouth 2 (two) times daily.   raloxifene  (EVISTA ) 60 MG tablet TAKE 1 TABLET (60 MG TOTAL) BY MOUTH DAILY. FOR BONE HEALTH.   SYMBICORT  160-4.5 MCG/ACT inhaler Inhale 2 puffs into the lungs 2 (two) times daily.   No facility-administered encounter medications on file as of 10/17/2023.    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   Hearing loss    both ears, wwears hearing aides   Hyperlipidemia    Joint pain 08/06/2020   Laceration of head 05/07/2021   Lobar pneumonia (HCC) 02/19/2018   Macular degeneration    Malnutrition of moderate degree (HCC) 08/28/2014   Osteopenia    Pain of right lower extremity 06/21/2018   Peripheral neuralgia 08/18/2021   Pneumonia    Strain of cervical portion of both trapezius muscles 06/17/2022   Past Surgical History:  Procedure Laterality Date   ABDOMINAL HYSTERECTOMY     BREAST ENHANCEMENT SURGERY     CESAREAN SECTION     x 2   COLONOSCOPY WITH PROPOFOL  N/A 04/21/2014   Procedure: COLONOSCOPY WITH PROPOFOL ;  Surgeon: Gladis MARLA Louder, MD;  Location: [facility];  Service: Endoscopy;  Laterality: N/A;   EYE SURGERY Bilateral march 2015 and june 2015   lens for cataracts   LEFT HEART CATHETERIZATION WITH CORONARY ANGIOGRAM N/A 01/24/2012   Procedure: LEFT HEART CATHETERIZATION  WITH CORONARY ANGIOGRAM;  Surgeon: Wilbert JONELLE Bihari, MD;  Location: [facility];  Service: Cardiovascular;  Laterality: N/A;   MASTECTOMY  33 yrs ago   bilateral   Family History  Problem Relation Age of Onset   Macular degeneration Mother    Heart attack Father    Social History   Socioeconomic History   Marital status: Married    Spouse name: Not on file   Number of children: Not on file   Years of education: Not on file   Highest education level: Master's degree (e.g., MA, MS, MEng, MEd, MSW, MBA)  Occupational History   Occupation: retired  Tobacco Use   Smoking status: Never   Smokeless tobacco: Never  Vaping Use   Vaping status: Never Used  Substance and Sexual Activity   Alcohol use: Not Currently    Comment: occasionally   Drug use: No   Sexual activity: Not Currently  Other Topics Concern   Not on file  Social History Narrative   Married.   2  children, 6 grandchildren.   Retired. Previously was Runner, broadcasting/film/video.    Enjoys participating in church.    Social Drivers of Corporate investment banker Strain: Low Risk  (10/15/2023)   Overall Financial Resource Strain (CARDIA)    Difficulty of Paying Living Expenses: Not hard at all  Food Insecurity: No Food Insecurity (10/15/2023)   Hunger Vital Sign    Worried About Running Out of Food in the Last Year: Never true    Ran Out of Food in the Last Year: Never true  Transportation Needs: No Transportation Needs (10/15/2023)   PRAPARE - Administrator, Civil Service (Medical): No    Lack of Transportation (Non-Medical): No  Physical Activity: Insufficiently Active (10/15/2023)   Exercise Vital Sign    Days of Exercise per Week: 3 days    Minutes of Exercise per Session: 30 min  Stress: No Stress Concern Present (10/15/2023)   [employer] of Occupational Health - Occupational Stress Questionnaire    Feeling of Stress: Not at all  Social Connections: Socially Integrated (10/15/2023)   Social Connection and  Isolation Panel    Frequency of Communication with Friends and Family: Twice a week    Frequency of Social Gatherings with Friends and Family: Once a week    Attends Religious Services: More than 4 times per year    Active Member of [employer] or Organizations: Yes    Attends Engineer, structural: More than 4 times per year    Marital Status: Married    Tobacco Counseling Counseling given: Not Answered    Clinical Intake:  Pre-visit preparation completed: Yes  Pain : No/denies pain     BMI - recorded: 2602 Nutritional Status: BMI of 19-24  Normal Nutritional Risks: None Diabetes: No  Lab Results  Component Value Date   HGBA1C 5.9 08/17/2023   HGBA1C 5.9 08/12/2022   HGBA1C 5.7 08/06/2020     How often do you need to have someone help you when you read instructions, pamphlets, or other written materials from your doctor or pharmacy?: 1 - Never What is the last grade level you completed in school?: Masters  Interpreter Needed?: No  Information entered by :: Lyle Anderson LATHER   Activities of Daily Living     10/15/2023    7:19 PM  In your present state of health, do you have any difficulty performing the following activities:  Hearing? 1  Vision? 1  Difficulty concentrating or making decisions? 0  Walking or climbing stairs? 1  Dressing or bathing? 0  Doing errands, shopping? 0  Preparing Food and eating ? N  Using the Toilet? N  In the past six months, have you accidently leaked urine? Y  Do you have problems with loss of bowel control? N  Managing your Medications? N  Managing your Finances? N  Housekeeping or managing your Housekeeping? N    Patient Care Team: Gretta Comer POUR, NP as PCP - General (Internal Medicine)  I have updated your Care Teams any recent Medical Services you may have received from other providers in the past year.     Assessment:   This is a routine wellness examination for [place].  Hearing/Vision screen Hearing  Screening - Comments:: Patient has some hearing aids Vision Screening - Comments:: Patient has some glasses    Goals Addressed             This Visit's Progress    Remain active and independent   On track  Depression Screen     10/17/2023   11:48 AM 08/17/2023    9:25 AM 11/01/2022    3:37 PM 09/27/2022    9:25 AM 08/12/2022    9:29 AM 08/10/2021   11:31 AM 07/26/2021    2:39 PM  PHQ 2/9 Scores  PHQ - 2 Score 0 0 0 0 0 0 0  PHQ- 9 Score 0  0   0     Fall Risk     10/15/2023    7:19 PM 08/17/2023    9:25 AM 06/05/2023    9:37 AM 04/07/2023    1:04 PM 11/01/2022    3:37 PM  Fall Risk   Falls in the past year? 0 0 0 0 0  Number falls in past yr: 0 0  0 0  Injury with Fall? 0 0  0 0  Risk for fall due to : No Fall Risks No Fall Risks  No Fall Risks No Fall Risks  Follow up Falls evaluation completed Falls evaluation completed  Falls evaluation completed Falls evaluation completed    MEDICARE RISK AT HOME:  Medicare Risk at Home Any stairs in or around the home?: (Patient-Rptd) Yes If so, are there any without handrails?: (Patient-Rptd) No Home free of loose throw rugs in walkways, pet beds, electrical cords, etc?: (Patient-Rptd) No Adequate lighting in your home to reduce risk of falls?: (Patient-Rptd) Yes Life alert?: (Patient-Rptd) No Use of a cane, walker or w/c?: (Patient-Rptd) No Grab bars in the bathroom?: (Patient-Rptd) No Shower chair or bench in shower?: (Patient-Rptd) Yes Elevated toilet seat or a handicapped toilet?: (Patient-Rptd) No  TIMED UP AND GO:  Was the test performed?  No  Cognitive Function: 6CIT completed    07/24/2020    2:52 PM 06/10/2019   10:40 AM 06/06/2018    9:26 AM 05/31/2017   10:05 AM  MMSE - Mini Mental State Exam  Not completed: Refused     Orientation to time  5 5 5   Orientation to Place  5 5 5   Registration  3 3 3   Attention/ Calculation  5 0 0  Recall  3 3 3   Language- name 2 objects   0 0  Language- repeat  1 1 1   Language-  follow 3 step command   3 3  Language- read & follow direction   0 0  Write a sentence   0 0  Copy design   0 0  Total score   20 20        09/27/2022    9:26 AM 07/26/2021    2:41 PM  6CIT Screen  What Year? 0 points 0 points  What month? 0 points 0 points  What time? 0 points 0 points  Count back from 20 0 points 0 points  Months in reverse 0 points 0 points  Repeat phrase 0 points 0 points  Total Score 0 points 0 points    Immunizations Immunization History  Administered Date(s) Administered   Fluad Quad(high Dose 65+) 01/15/2019, 01/01/2020, 02/03/2022   Influenza Split 12/07/2011   Influenza Whole 03/11/2010   Influenza, High Dose Seasonal PF 11/25/2013, 02/15/2021, 01/24/2023   Influenza,inj,Quad PF,6+ Mos 12/26/2013, 12/02/2014, 01/05/2017, 01/16/2018   Influenza-Unspecified 12/07/2011   PFIZER(Purple Top)SARS-COV-2 Vaccination 04/20/2019, 05/12/2019, 01/27/2020   PNEUMOCOCCAL CONJUGATE-20 02/16/2023   Pneumococcal Conjugate-13 11/25/2013, 12/26/2013   Pneumococcal Polysaccharide-23 03/28/2004, 05/10/2006, 01/16/2018   Td 08/08/1999   Tdap 03/28/2009, 08/20/2009, 04/30/2021   Zoster Recombinant(Shingrix) 08/11/2020, 12/14/2020   Zoster, Live 03/28/2009,  08/20/2009    Screening Tests Health Maintenance  Topic Date Due   INFLUENZA VACCINE  10/27/2023   Medicare Annual Wellness (AWV)  10/16/2024   DTaP/Tdap/Td (5 - Td or Tdap) 05/01/2031   Pneumococcal Vaccine: 50+ Years  Completed   DEXA SCAN  Completed   Zoster Vaccines- Shingrix  Completed   Hepatitis B Vaccines  Aged Out   HPV VACCINES  Aged Out   Meningococcal B Vaccine  Aged Out   COVID-19 Vaccine  Discontinued   Hepatitis C Screening  Discontinued    Health Maintenance  There are no preventive care reminders to display for this patient. Health Maintenance Items Addressed:  Additional Screening:  Vision Screening: Recommended annual ophthalmology exams for early detection of glaucoma and other  disorders of the eye. Would you like a referral to an eye doctor? No    Dental Screening: Recommended annual dental exams for proper oral hygiene  Community Resource Referral / Chronic Care Management: CRR required this visit?  No   CCM required this visit?  No   Plan:    I have personally reviewed and noted the following in the patient's chart:   Medical and social history Use of alcohol, tobacco or illicit drugs  Current medications and supplements including opioid prescriptions. Patient is not currently taking opioid prescriptions. Functional ability and status Nutritional status Physical activity Advanced directives List of other physicians Hospitalizations, surgeries, and ER visits in previous 12 months Vitals Screenings to include cognitive, depression, and falls Referrals and appointments  In addition, I have reviewed and discussed with patient certain preventive protocols, quality metrics, and best practice recommendations. A written personalized care plan for preventive services as well as general preventive health recommendations were provided to patient.   Lyle MARLA Right, [place]   10/17/2023   After Visit Summary: (MyChart) Due to this being a telephonic visit, the after visit summary with patients personalized plan was offered to patient via MyChart   Notes: Please refer to Routing Comments.

## 2023-10-17 NOTE — Patient Instructions (Signed)
 Ms. Fesperman , Thank you for taking time out of your busy schedule to complete your Annual Wellness Visit with me. I enjoyed our conversation and look forward to speaking with you again next year. I, as well as your care team,  appreciate your ongoing commitment to your health goals. Please review the following plan we discussed and let me know if I can assist you in the future. Your Game plan/ To Do List    Referrals: If you haven't heard from the office you've been referred to, please reach out to them at the phone provided.  none Follow up Visits: Next Medicare AWV with our clinical staff: 10/17/2024   Have you seen your provider in the last 6 months (3 months if uncontrolled diabetes)? No Next Office Visit with your provider: n/a  Clinician Recommendations:  Aim for 30 minutes of exercise or brisk walking, 6-8 glasses of water, and 5 servings of fruits and vegetables each day.       This is a list of the screening recommended for you and due dates:  Health Maintenance  Topic Date Due   Flu Shot  10/27/2023   Medicare Annual Wellness Visit  10/16/2024   DTaP/Tdap/Td vaccine (5 - Td or Tdap) 05/01/2031   Pneumococcal Vaccine for age over 56  Completed   DEXA scan (bone density measurement)  Completed   Zoster (Shingles) Vaccine  Completed   Hepatitis B Vaccine  Aged Out   HPV Vaccine  Aged Out   Meningitis B Vaccine  Aged Out   COVID-19 Vaccine  Discontinued   Hepatitis C Screening  Discontinued    Advanced directives: (Copy Requested) Please bring a copy of your health care power of attorney and living will to the office to be added to your chart at your convenience. You can mail to [facility] [address] [place], [place] [address] or email to [email or web address] Advance Care Planning is important because it:  [x]  Makes sure you receive the medical care that is consistent with your values, goals, and preferences  [x]  It provides guidance to your family  and loved ones and reduces their decisional burden about whether or not they are making the right decisions based on your wishes.  Follow the link provided in your after visit summary or read over the paperwork we have mailed to you to help you started getting your Advance Directives in place. If you need assistance in completing these, please reach out to us  so that we can help you!  See attachments for Preventive Care and Fall Prevention Tips.

## 2023-10-18 DIAGNOSIS — H353221 Exudative age-related macular degeneration, left eye, with active choroidal neovascularization: Secondary | ICD-10-CM | POA: Diagnosis not present

## 2023-11-01 ENCOUNTER — Encounter: Admitting: Internal Medicine

## 2023-11-02 ENCOUNTER — Encounter: Admitting: Internal Medicine

## 2023-11-02 ENCOUNTER — Encounter

## 2023-11-02 DIAGNOSIS — C44319 Basal cell carcinoma of skin of other parts of face: Secondary | ICD-10-CM | POA: Diagnosis not present

## 2023-11-09 ENCOUNTER — Other Ambulatory Visit: Payer: Self-pay

## 2023-11-13 ENCOUNTER — Other Ambulatory Visit: Payer: Self-pay

## 2023-11-13 ENCOUNTER — Other Ambulatory Visit: Payer: Self-pay | Admitting: Pharmacy Technician

## 2023-11-13 NOTE — Progress Notes (Signed)
 Specialty Pharmacy Refill Coordination Note  [name] is a 83 y.o. female contacted today regarding refills of specialty medication(s) Dupilumab  (Dupixent )   Patient requested Delivery   Delivery date: 11/14/23   Verified address: [address] [place] [address]   Medication will be filled on 11/13/23. Injection date 8/24.

## 2023-11-20 DIAGNOSIS — L820 Inflamed seborrheic keratosis: Secondary | ICD-10-CM | POA: Diagnosis not present

## 2023-12-01 ENCOUNTER — Other Ambulatory Visit: Payer: Self-pay | Admitting: Primary Care

## 2023-12-01 DIAGNOSIS — G8929 Other chronic pain: Secondary | ICD-10-CM

## 2023-12-06 ENCOUNTER — Ambulatory Visit
Admission: EM | Admit: 2023-12-06 | Discharge: 2023-12-06 | Disposition: A | Discharge status: Home / Self Care | Attending: Nurse Practitioner | Admitting: Nurse Practitioner

## 2023-12-06 ENCOUNTER — Other Ambulatory Visit: Payer: Self-pay

## 2023-12-06 ENCOUNTER — Encounter: Payer: Self-pay | Admitting: Emergency Medicine

## 2023-12-06 ENCOUNTER — Other Ambulatory Visit: Payer: Self-pay | Admitting: Pharmacy Technician

## 2023-12-06 DIAGNOSIS — W57XXXA Bitten or stung by nonvenomous insect and other nonvenomous arthropods, initial encounter: Secondary | ICD-10-CM

## 2023-12-06 DIAGNOSIS — J069 Acute upper respiratory infection, unspecified: Secondary | ICD-10-CM

## 2023-12-06 LAB — POC SARS CORONAVIRUS 2 AG -  ED: SARS Coronavirus 2 Ag: NEGATIVE

## 2023-12-06 MED ORDER — BENZONATATE 200 MG PO CAPS: 200.0000 mg | ORAL_CAPSULE | Freq: Three times a day (TID) | ORAL | 0 refills | Status: AC | PRN

## 2023-12-06 MED ORDER — DOXYCYCLINE HYCLATE 100 MG PO CAPS: 100.0000 mg | ORAL_CAPSULE | Freq: Two times a day (BID) | ORAL | 0 refills | Status: AC

## 2023-12-06 NOTE — Progress Notes (Signed)
 Specialty Pharmacy Refill Coordination Note  [name] is a 83 y.o. female contacted today regarding refills of specialty medication(s) Dupilumab  (Dupixent )   Patient requested Delivery   Delivery date: 12/13/23   Verified address: [address] [place]   Medication will be filled on 12/12/23.

## 2023-12-06 NOTE — ED Triage Notes (Addendum)
 Pt st's she started with a sore throat on Monday, last night started having generalized body aches and nasal congestion.  Pt also c/o nausea without vomiting and nonproductive cough

## 2023-12-06 NOTE — ED Provider Notes (Signed)
 [facility]    CSN: [id number] Arrival date & time: 12/06/23  9166      History   Chief Complaint Chief Complaint  Patient presents with   Generalized Body Aches   Sore Throat    HPI [name] is a 83 y.o. female.   Discussed the use of AI scribe software for clinical note transcription with the patient, who gave verbal consent to proceed.   The patient reports feeling unwell since last night, with symptoms starting 2 days ago. The patient experiences aches and pains throughout their body, along with nausea and a sensation of heaviness in their head. She has a dry, non-productive cough and chills without fever. The patient notes a runny nose, which is usual for them but slightly more pronounced currently. She denies any postnasal drip. To manage symptoms, the patient took Nyquil last night. They mention having various allergy  medications and prefer not to add new ones when experiencing cold symptoms The patient visited a restaurant on 3 days ago and speculates they might have contracted an illness there. The patient has been using their inhaler as usual for their asthma, reporting no increased respiratory difficulties beyond their baseline.  Additionally, the patient mentions two tick bites, which they attempted to remove with tweezers. The bites are described as itchy, and the patient applied Neosporin to the affected areas. They report that their jeans rubbing against the bites increased the itching sensation. The patient denies any history of Lyme disease or other tick-borne illnesses.  The following portions of the patient's history were reviewed and updated as appropriate: allergies, current medications, past family history, past medical history, past social history, past surgical history, and problem list.    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   Hearing loss    both ears, wwears hearing aides   Hyperlipidemia    Joint pain 08/06/2020   Laceration of head 05/07/2021   Lobar pneumonia (HCC) 02/19/2018   Macular degeneration    Malnutrition of moderate degree (HCC) 08/28/2014   Osteopenia    Pain of right lower extremity 06/21/2018   Peripheral neuralgia 08/18/2021   Pneumonia    Strain of cervical portion of both trapezius muscles 06/17/2022    Patient Active Problem List   Diagnosis Date Noted   Urinary incontinence 08/12/2022   Prediabetes 08/12/2022   Localized skin mass, lump, or swelling 03/22/2022   Arthritis, degenerative 08/18/2021   Lactose intolerance 08/18/2021   Personal history of malignant neoplasm of breast 08/18/2021   Chronic foot pain, right 08/10/2021   Lower extremity weakness 05/07/2021   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   Preventative health care 06/05/2017   Chronic back pain 01/06/2017   Osteopenia 07/18/2016   Absolute anemia    Allergic rhinitis 04/19/2012   Hyperlipidemia 03/10/2010   Macular degeneration 03/10/2010   Moderate persistent asthma 03/10/2010    Past Surgical History:  Procedure Laterality Date   ABDOMINAL HYSTERECTOMY     BREAST ENHANCEMENT SURGERY     CESAREAN SECTION     x 2   COLONOSCOPY WITH PROPOFOL  N/A 04/21/2014   Procedure: COLONOSCOPY WITH PROPOFOL ;  Surgeon: Gladis MARLA Louder, MD;  Location: [facility];  Service: Endoscopy;  Laterality: N/A;   EYE SURGERY Bilateral march 2015 and june 2015   lens for cataracts   LEFT HEART CATHETERIZATION WITH CORONARY ANGIOGRAM N/A 01/24/2012   Procedure: LEFT HEART CATHETERIZATION WITH CORONARY ANGIOGRAM;  Surgeon: Wilbert JONELLE Bihari, MD;  Location: [facility];  Service: Cardiovascular;  Laterality: N/A;   MASTECTOMY  33 yrs ago   bilateral    OB History   No  obstetric history on file.      Home Medications    Prior to Admission medications   Medication Sig Start Date End Date Taking? Authorizing Provider  benzonatate  (TESSALON ) 200 MG capsule Take 1 capsule (200 mg total) by mouth 3 (three) times daily as needed for cough. 12/06/23  Yes Iola Lukes, FNP  doxycycline  (VIBRAMYCIN ) 100 MG capsule Take 1 capsule (100 mg total) by mouth 2 (two) times daily. 12/06/23  Yes Iola Lukes, FNP  albuterol  (VENTOLIN  HFA) 108 (90 Base) MCG/ACT inhaler Inhale 2 puffs into the lungs every 6 (six) hours as needed for wheezing or shortness of breath. 06/05/23   Parrett, Madelin RAMAN, NP  Cholecalciferol (VITAMIN D ) 1000 UNITS capsule Take 1,000 Units by mouth daily with breakfast.     [provider]  diclofenac  (VOLTAREN ) 75 MG EC tablet TAKE 1 TABLET BY MOUTH TWICE A DAY AS NEEDED FOR BACK PAIN 12/01/23   Clark, Katherine K, NP  DUPIXENT  300 MG/2ML prefilled syringe INJECT 300MG  (1 SYRINGE) SUBCUTANEOUSLY EVERY OTHER WEEK. 06/05/23   Parrett, Madelin RAMAN, NP  levocetirizine (XYZAL ) 5 MG tablet TAKE 1 TABLET BY MOUTH EVERY DAY IN THE EVENING FOR ALLERGIES 09/24/23   Clark, Katherine K, NP  montelukast  (SINGULAIR ) 10 MG tablet Take 1 tablet (10 mg total) by mouth at bedtime. For allergies and asthma 06/05/23   Parrett, Madelin RAMAN, NP  Multiple Vitamins-Minerals (MULTI FOR HER 50+) TABS Take 1 tablet by mouth daily.    [provider]  Multiple Vitamins-Minerals (PRESERVISION/LUTEIN) CAPS Take 1 capsule by mouth 2 (two) times daily.    [provider]  raloxifene  (EVISTA ) 60 MG tablet TAKE 1 TABLET (60 MG TOTAL) BY MOUTH DAILY. FOR BONE HEALTH. 09/24/23   Clark, Katherine K, NP  SYMBICORT  160-4.5 MCG/ACT inhaler Inhale 2 puffs into the lungs 2 (two) times daily. 06/05/23   Parrett, Madelin RAMAN, NP    Family History Family History  Problem Relation Age of Onset   Macular degeneration Mother    Heart attack Father     Social History Social History    Tobacco Use   Smoking status: Never   Smokeless tobacco: Never  Vaping Use   Vaping status: Never Used  Substance Use Topics   Alcohol use: Not Currently    Comment: occasionally   Drug use: No     Allergies   Fosamax [alendronate sodium] and Sulfonamide derivatives   Review of Systems Review of Systems  Constitutional:  Positive for chills. Negative for fever.  HENT:  Positive for congestion, rhinorrhea and sore throat. Negative for postnasal drip and sneezing.   Respiratory:  Positive for cough. Negative for shortness of breath and wheezing.   Gastrointestinal:  Positive for nausea.  Musculoskeletal:  Positive for myalgias.  Skin:  Positive for wound.  Neurological:  Positive for headaches.  All other systems reviewed and are negative.    Physical Exam Triage Vital Signs ED Triage Vitals  Encounter Vitals Group     BP 12/06/23 0851 (!) 156/78     Girls Systolic  BP Percentile --      Girls Diastolic BP Percentile --      Boys Systolic BP Percentile --      Boys Diastolic BP Percentile --      Pulse Rate 12/06/23 0851 100     Resp 12/06/23 0851 18     Temp 12/06/23 0851 98 F (36.7 C)     Temp Source 12/06/23 0851 Oral     SpO2 12/06/23 0851 95 %     Weight --      Height --      Head Circumference --      Peak Flow --      Pain Score 12/06/23 0852 2     Pain Loc --      Pain Education --      Exclude from Growth Chart --    No data found.  Updated Vital Signs BP (!) 156/78 (BP Location: Left Arm)   Pulse 100   Temp 98 F (36.7 C) (Oral)   Resp 18   SpO2 95%   Visual Acuity Right Eye Distance:   Left Eye Distance:   Bilateral Distance:    Right Eye Near:   Left Eye Near:    Bilateral Near:     Physical Exam Vitals reviewed.  Constitutional:      General: She is awake. She is not in acute distress.    Appearance: Normal appearance. She is well-developed. She is not ill-appearing, toxic-appearing or diaphoretic.  HENT:     Head:  Normocephalic.     Right Ear: Tympanic membrane, ear canal and external ear normal. No drainage, swelling or tenderness. No middle ear effusion. Tympanic membrane is not erythematous.     Left Ear: Tympanic membrane, ear canal and external ear normal. No drainage, swelling or tenderness.  No middle ear effusion. Tympanic membrane is not erythematous.     Nose: Congestion present. No rhinorrhea.     Mouth/Throat:     Lips: Pink.     Mouth: Mucous membranes are moist.     Pharynx: No pharyngeal swelling, oropharyngeal exudate, posterior oropharyngeal erythema or uvula swelling.     Tonsils: No tonsillar exudate or tonsillar abscesses.  Eyes:     General: Vision grossly intact.     Conjunctiva/sclera: Conjunctivae normal.  Cardiovascular:     Rate and Rhythm: Normal rate.     Heart sounds: Normal heart sounds.  Pulmonary:     Effort: Pulmonary effort is normal. No tachypnea or respiratory distress.     Breath sounds: Normal breath sounds and air entry.     Comments: Respirations even and unlabored  Musculoskeletal:        General: Normal range of motion.     Cervical back: Normal range of motion and neck supple.  Lymphadenopathy:     Cervical: No cervical adenopathy.  Skin:    General: Skin is warm and dry.     Comments: Lesion present on the right inner thigh with mild surrounding erythema. No central clearing, necrosis, fluctuance, or drainage observed. A second lesion is noted on the anterior aspect of the left anterior thigh with similar findings of mild erythema, without central clearing, necrosis, fluctuance, or drainage (see images below)   Neurological:     General: No focal deficit present.     Mental Status: She is alert and oriented to person, place, and time.  Psychiatric:        Behavior: Behavior is cooperative.      #1 -  right inner thigh     #2 - left anterior thigh   UC Treatments / Results  Labs (all labs ordered are listed, but only abnormal results are  displayed) Labs Reviewed  POC SARS CORONAVIRUS 2 AG -  ED    EKG   Radiology No results found.  Procedures Procedures (including critical care time)  Medications Ordered in UC Medications - No data to display  Initial Impression / Assessment and Plan / UC Course  I have reviewed the triage vital signs and the nursing notes.  Pertinent labs & imaging results that were available during my care of the patient were reviewed by me and considered in my medical decision making (see chart for details).     The patient presents with symptoms consistent with a viral upper respiratory infection. Exam is reassuring and no evidence of bacterial infection or acute cardiopulmonary process is noted. Supportive care is recommended. Patient was advised to follow up with primary care if symptoms do not improve within one week or if new concerns arise. Instructions were given to seek emergency care if symptoms worsen, including shortness of breath, chest pain, persistent high fever, inability to tolerate fluids, or confusion.  The patient presents after a recent tick bite. Exam shows mild localized erythema at the bite site without central clearing, necrosis, vesicular lesions, or other concerning findings. No systemic symptoms reported. Prophylactic doxycycline  was prescribed to reduce the risk of tick-borne illness. Patient educated on signs and symptoms of Lyme disease and other tick-borne infections, with instructions to seek prompt medical care if fever, rash, or systemic symptoms develop.  Today's evaluation has revealed no signs of a dangerous process. Discussed diagnosis with patient and/or guardian. Patient and/or guardian aware of their diagnosis, possible red flag symptoms to watch out for and need for close follow up. Patient and/or guardian understands verbal and written discharge instructions. Patient and/or guardian comfortable with plan and disposition.  Patient and/or guardian has a clear  mental status at this time, good insight into illness (after discussion and teaching) and has clear judgment to make decisions regarding their care  Documentation was completed with the aid of voice recognition software. Transcription may contain typographical errors.  Final Clinical Impressions(s) / UC Diagnoses   Final diagnoses:  Viral upper respiratory tract infection  Tick bite, unspecified site, initial encounter     Discharge Instructions      Your symptoms are most likely caused by a respiratory infection, which affects areas like your nose, throat, or lungs. This type of infection is usually caused by a virus. Since your illness is caused by a virus, antibiotics won't help because they only treat infections caused by bacteria.  You were also seen today for a tick bite. There is mild redness at the bite site, but no signs of infection or complications at this time. You were prescribed doxycycline  to help prevent tick-borne illness. Take the medication exactly as directed. Watch for warning signs such as spreading rash (especially a bull's-eye pattern), fever, chills, headache, muscle or joint aches, or feeling unwell. If any of these occur, or if the redness around the bite worsens, seek medical care right away or go to the emergency department. Keep the area clean and dry. You may apply a cool compress if sore or itchy. Follow up with your primary care provider or return if you have any concerns.  Take the medications that were prescribed to you as directed. If you have a fever, headache, or body aches, you  can also take Tylenol  or ibuprofen to help you feel more comfortable. Be sure to drink plenty of fluids to stay hydrated--aim for enough to keep your urine a pale yellow color. This will also help to thin mucus and make it easier to clear from your body.   Using a cool mist humidifier at home to keep humidity levels above 50% can be helpful. You can also inhale steam for 10 to 15  minutes, 3 to 4 times a day. This can be done by sitting in the bathroom with a hot shower running, or by using over-the-counter vapor shower tablets to help with nasal congestion. Try to avoid cool or dry air as much as possible. When you sleep, keep your head elevated to help reduce post-nasal drainage. Be sure to get enough rest every night to support your recovery. Don't forget to replace your toothbrush once you start feeling better.   It's normal for a cough to linger for several weeks after a respiratory illness, even after other symptoms have resolved. This happens because the airways remain irritated and take time to fully heal. As long as the cough gradually improves and there are no new concerning symptoms, this is part of the normal recovery process.  If your symptoms get worse or if you develop any new or concerning symptoms, go to the emergency room right away. If you're not feeling better in a few days, follow up with your primary care provider.      ED Prescriptions     Medication Sig Dispense Auth. Provider   benzonatate  (TESSALON ) 200 MG capsule Take 1 capsule (200 mg total) by mouth 3 (three) times daily as needed for cough. 30 capsule Iola Lukes, FNP   doxycycline  (VIBRAMYCIN ) 100 MG capsule Take 1 capsule (100 mg total) by mouth 2 (two) times daily. 14 capsule Iola Lukes, FNP      PDMP not reviewed this encounter.   Iola Tamora, [place] 12/06/23 [phone]

## 2023-12-06 NOTE — Discharge Instructions (Addendum)
 Your symptoms are most likely caused by a respiratory infection, which affects areas like your nose, throat, or lungs. This type of infection is usually caused by a virus. Since your illness is caused by a virus, antibiotics won't help because they only treat infections caused by bacteria.  You were also seen today for a tick bite. There is mild redness at the bite site, but no signs of infection or complications at this time. You were prescribed doxycycline  to help prevent tick-borne illness. Take the medication exactly as directed. Watch for warning signs such as spreading rash (especially a bull's-eye pattern), fever, chills, headache, muscle or joint aches, or feeling unwell. If any of these occur, or if the redness around the bite worsens, seek medical care right away or go to the emergency department. Keep the area clean and dry. You may apply a cool compress if sore or itchy. Follow up with your primary care provider or return if you have any concerns.  Take the medications that were prescribed to you as directed. If you have a fever, headache, or body aches, you can also take Tylenol  or ibuprofen to help you feel more comfortable. Be sure to drink plenty of fluids to stay hydrated--aim for enough to keep your urine a pale yellow color. This will also help to thin mucus and make it easier to clear from your body.   Using a cool mist humidifier at home to keep humidity levels above 50% can be helpful. You can also inhale steam for 10 to 15 minutes, 3 to 4 times a day. This can be done by sitting in the bathroom with a hot shower running, or by using over-the-counter vapor shower tablets to help with nasal congestion. Try to avoid cool or dry air as much as possible. When you sleep, keep your head elevated to help reduce post-nasal drainage. Be sure to get enough rest every night to support your recovery. Don't forget to replace your toothbrush once you start feeling better.   It's normal for a cough to  linger for several weeks after a respiratory illness, even after other symptoms have resolved. This happens because the airways remain irritated and take time to fully heal. As long as the cough gradually improves and there are no new concerning symptoms, this is part of the normal recovery process.  If your symptoms get worse or if you develop any new or concerning symptoms, go to the emergency room right away. If you're not feeling better in a few days, follow up with your primary care provider.

## 2023-12-08 ENCOUNTER — Ambulatory Visit: Payer: Self-pay

## 2023-12-08 NOTE — Telephone Encounter (Signed)
 Agree with all nursing recommendations.  Proceed to urgent care if cough continues.  Please advise Hospital precautions for shortness of breath, high fevers, increased fatigue.

## 2023-12-08 NOTE — Telephone Encounter (Signed)
 Unable to reach patient. Left voicemail to return call to our office.

## 2023-12-08 NOTE — Telephone Encounter (Signed)
 FYI Only or Action Required?: Action required by provider: Declines offer for appt, needs call back with further recommendations for symptoms.  Patient was last seen in primary care on 08/17/2023 by Gretta Comer POUR, NP.  Called Nurse Triage reporting Covid Positive, Fatigue, Headache, Cough, Nasal Congestion, and Generalized Body Aches.  Symptoms began several days ago.  Interventions attempted: Rest, hydration, or home remedies and Other: UC on 9/10 for URI symptoms and gave doxycycline  for tick bites.  Symptoms are: thought was getting better but coughing is getting worse per pt.  Triage Disposition: Call PCP Within 24 Hours  Patient/caregiver understands and will follow disposition?: Yes     Copied from CRM #[id number]. Topic: Clinical - Red Word Triage >> Dec 08, 2023  2:10 PM Macario HERO wrote: Red Word that prompted transfer to Nurse Triage: Patient tested positive for COVID and being treated for a tick bite.  Reason for Disposition  [1] HIGH RISK patient (e.g., weak immune system, 65 years and older, obesity with BMI 30 or higher, pregnant, chronic lung disease) AND [2] COVID symptoms (e.g., cough, fever)  (Exceptions: Already seen by PCP and no new or worsening symptoms.)  Answer Assessment - Initial Assessment Questions 1. SYMPTOMS: What is your main symptom or concern? (e.g., cough, fever, shortness of breath, muscle aches)     Muscle aches Cough Nasal congestion Headache No fever 2. ONSET: When did the symptoms start?      Monday 3. COUGH: Do you have a cough? If Yes, ask: How bad is the cough?       Coughing up clear sputum, just started coughing fits 4. FEVER: Do you have a fever? If Yes, ask: What is your temperature, how was it measured, and when did it start?     no 5. BREATHING DIFFICULTY: Are you having any difficulty breathing? (e.g., normal; shortness of breath, wheezing, unable to speak)      Denies hard time catching her breath 6.  BETTER-SAME-WORSE: Are you getting better, staying the same or getting worse compared to yesterday?  If getting worse, ask, In what way?     Thought was getting better but coughing is getting worse 7. OTHER SYMPTOMS: Do you have any other symptoms?  (e.g., chills, fatigue, headache, loss of smell or taste, muscle pain, sore throat)     Don't usually have headaches but headache 2-3/10 Did at beginning Sore throat seems to have gone away kinda raw which makes me cough Chills at the beginning but no fever Guess I've lost my taste and smell, haven't really had anything to smell Fatigue, feeling weaker than usual, legs are weaker, not too weak to stand 8. COVID-19 DIAGNOSIS: How do you know that you have COVID? (e.g., positive lab test or self-test, diagnosed by doctor or NP/PA, symptoms after exposure).     Tested positive today, negative on wednesday 10. COVID-19 VACCINE: Have you had the COVID-19 vaccine? If Yes, ask: When did you last get it?       Had the first 3 shots a few years ago 11. HIGH RISK DISEASE: Do you have any chronic medical problems? (e.g., asthma, heart or lung disease, weak immune system, obesity, etc.)       Asthma, was having to use inhalers more often than usual before covid but not more since covid Did use rescue inhaler yesterday afternoon because forgot to take regular dose maintenance inhaler in morning 13. O2 SATURATION MONITOR:  Do you use an oxygen saturation monitor (pulse oximeter) at home?  If Yes, ask What is your reading (oxygen level) today? What is your usual oxygen saturation reading? 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 Advised pt use albuterol  inhaler as needed. Sending message to PCP for call back with further recommendations.  Protocols used: COVID-19 - Diagnosed or Suspected-A-AH

## 2023-12-11 NOTE — Telephone Encounter (Signed)
 Spoke with patient, reviewed [name] recommendations and gave ED precautions. Patient states she is feeling much better. She will update if symptoms return or fail to improve.

## 2023-12-12 ENCOUNTER — Other Ambulatory Visit: Payer: Self-pay

## 2023-12-13 DIAGNOSIS — H353221 Exudative age-related macular degeneration, left eye, with active choroidal neovascularization: Secondary | ICD-10-CM | POA: Diagnosis not present

## 2023-12-14 ENCOUNTER — Ambulatory Visit: Admitting: Primary Care

## 2023-12-14 ENCOUNTER — Encounter: Payer: Self-pay | Admitting: Primary Care

## 2023-12-14 VITALS — BP 138/84 | HR 102 | Temp 97.5°F | Ht 59.5 in | Wt 138.0 lb

## 2023-12-14 DIAGNOSIS — K649 Unspecified hemorrhoids: Secondary | ICD-10-CM

## 2023-12-14 DIAGNOSIS — N898 Other specified noninflammatory disorders of vagina: Secondary | ICD-10-CM | POA: Diagnosis not present

## 2023-12-14 DIAGNOSIS — R21 Rash and other nonspecific skin eruption: Secondary | ICD-10-CM | POA: Diagnosis not present

## 2023-12-14 MED ORDER — HYDROCORTISONE (PERIANAL) 2.5 % EX CREA: 1.0000 | TOPICAL_CREAM | Freq: Two times a day (BID) | CUTANEOUS | 0 refills | Status: AC

## 2023-12-14 MED ORDER — METHYLPREDNISOLONE ACETATE 80 MG/ML IJ SUSP
80.0000 mg | Freq: Once | INTRAMUSCULAR | Status: AC
  Administered 2023-12-14: 80 mg via INTRAMUSCULAR

## 2023-12-14 NOTE — Patient Instructions (Addendum)
 You may apply the hemorrhoid cream twice daily for 1 to 2 weeks if needed.  Try sitz bath/warm soaks.  Will be in touch tomorrow regarding your vaginal swab results.  It was a pleasure to see you today! [name]

## 2023-12-14 NOTE — Assessment & Plan Note (Signed)
 Differentials include vaginitis, vaginal vaginosis.  Wet prep collected today, await results.

## 2023-12-14 NOTE — Assessment & Plan Note (Signed)
 1.5 cm oval shaped hemorrhoid noted to external rectum tender.  Nonthrombosed. Several smaller anal tags noted  Start Anusol  HC cream twice daily x 1 to 2 weeks. We also discussed sitz bath's, warm soaks

## 2023-12-14 NOTE — Progress Notes (Signed)
 Subjective:    Patient ID: [name], female    DOB: [birth year], 83 y.o.   MRN: [id number]  [name] is a very pleasant 83 y.o. female with a history of breast cancer, eosinophilic asthma, chronic back pain who presents today to discuss several concerns.  1) Vaginal Itching: Symptom onset 3 days ago with vaginal itching and irritation.  She recently completed a course of doxycycline  100 mg twice daily x 7 days for two tick bites. She denies urinary urgency/frequency, vaginal discharge, dysuria.   2) Hemorrhoids: Chronic.  Over the last week she has noticed a flare with symptoms of pain to the rectal area. She's noticed a large hemorrhoid externally. She's used preparation H for the last 2 days, isn't sure if this is helping. Prior to symptom onset she wore a tight pair of underwear.   3) Itching: Acute onset 2 days ago with several small itchy, bumps to the mid thoracic back bilaterally. She denies fevers, chills. She's tried to apply Cortisone cream with some improvement. She is compliant to her Xyzal  daily.    Review of Systems  Constitutional:  Negative for fever.  Gastrointestinal:        Rectal pain/hemorrhoids  Genitourinary:  Negative for dysuria, flank pain, hematuria and vaginal discharge.       Vaginal itching  Skin:  Positive for rash.         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   Hearing loss    both ears, wwears hearing aides   Hyperlipidemia    Joint pain 08/06/2020   Laceration of head 05/07/2021   Lobar pneumonia (HCC) 02/19/2018   Macular degeneration    Malnutrition of moderate degree (HCC) 08/28/2014   Osteopenia    Pain of right lower extremity 06/21/2018   Peripheral  neuralgia 08/18/2021   Pneumonia    Strain of cervical portion of both trapezius muscles 06/17/2022    Social History   Socioeconomic History   Marital status: Married    Spouse name: Not on file   Number of children: Not on file   Years of education: Not on file   Highest education level: Master's degree (e.g., MA, MS, MEng, MEd, MSW, MBA)  Occupational History   Occupation: retired  Tobacco Use   Smoking status: Never   Smokeless tobacco: Never  Vaping Use   Vaping status: Never Used  Substance and Sexual Activity   Alcohol use: Not Currently    Comment: occasionally   Drug use: No   Sexual activity: Not Currently  Other Topics Concern   Not on file  Social History Narrative   Married.   2 children, 6 grandchildren.   Retired. Previously was Runner, broadcasting/film/video.    Enjoys participating in church.    Social Drivers of Corporate investment banker Strain: Low Risk  (10/15/2023)   Overall Financial Resource Strain (CARDIA)    Difficulty of Paying Living Expenses: Not hard at all  Food Insecurity: No Food Insecurity (10/15/2023)   Hunger Vital Sign    Worried About Running Out of Food in the Last Year: Never true    Ran Out of Food in the Last  Year: Never true  Transportation Needs: No Transportation Needs (10/15/2023)   PRAPARE - Administrator, Civil Service (Medical): No    Lack of Transportation (Non-Medical): No  Physical Activity: Insufficiently Active (10/15/2023)   Exercise Vital Sign    Days of Exercise per Week: 3 days    Minutes of Exercise per Session: 30 min  Stress: No Stress Concern Present (10/15/2023)   [employer] of Occupational Health - Occupational Stress Questionnaire    Feeling of Stress: Not at all  Social Connections: Socially Integrated (10/15/2023)   Social Connection and Isolation Panel    Frequency of Communication with Friends and Family: Twice a week    Frequency of Social Gatherings with Friends and Family: Once a week    Attends  Religious Services: More than 4 times per year    Active Member of [employer] or Organizations: Yes    Attends Banker Meetings: More than 4 times per year    Marital Status: Married  Catering manager Violence: Not At Risk (10/17/2023)   Humiliation, Afraid, Rape, and Kick questionnaire    Fear of Current or Ex-Partner: No    Emotionally Abused: No    Physically Abused: No    Sexually Abused: No    Past Surgical History:  Procedure Laterality Date   ABDOMINAL HYSTERECTOMY     BREAST ENHANCEMENT SURGERY     CESAREAN SECTION     x 2   COLONOSCOPY WITH PROPOFOL  N/A 04/21/2014   Procedure: COLONOSCOPY WITH PROPOFOL ;  Surgeon: Gladis MARLA Louder, MD;  Location: [facility];  Service: Endoscopy;  Laterality: N/A;   EYE SURGERY Bilateral march 2015 and june 2015   lens for cataracts   LEFT HEART CATHETERIZATION WITH CORONARY ANGIOGRAM N/A 01/24/2012   Procedure: LEFT HEART CATHETERIZATION WITH CORONARY ANGIOGRAM;  Surgeon: Wilbert JONELLE Bihari, MD;  Location: [facility];  Service: Cardiovascular;  Laterality: N/A;   MASTECTOMY  33 yrs ago   bilateral    Family History  Problem Relation Age of Onset   Macular degeneration Mother    Heart attack Father     Allergies  Allergen Reactions   Fosamax [Alendronate Sodium] Nausea Only   Sulfonamide Derivatives Rash    Current Outpatient Medications on File Prior to Visit  Medication Sig Dispense Refill   albuterol  (VENTOLIN  HFA) 108 (90 Base) MCG/ACT inhaler Inhale 2 puffs into the lungs every 6 (six) hours as needed for wheezing or shortness of breath. 3 each 3   Cholecalciferol (VITAMIN D ) 1000 UNITS capsule Take 1,000 Units by mouth daily with breakfast.      diclofenac  (VOLTAREN ) 75 MG EC tablet TAKE 1 TABLET BY MOUTH TWICE A DAY AS NEEDED FOR BACK PAIN 180 tablet 0   DUPIXENT  300 MG/2ML prefilled syringe INJECT 300MG  (1 SYRINGE) SUBCUTANEOUSLY EVERY OTHER WEEK. 8 mL 3   levocetirizine (XYZAL ) 5 MG tablet TAKE 1 TABLET BY MOUTH  EVERY DAY IN THE EVENING FOR ALLERGIES 90 tablet 2   montelukast  (SINGULAIR ) 10 MG tablet Take 1 tablet (10 mg total) by mouth at bedtime. For allergies and asthma 90 tablet 2   Multiple Vitamins-Minerals (MULTI FOR HER 50+) TABS Take 1 tablet by mouth daily.     Multiple Vitamins-Minerals (PRESERVISION/LUTEIN) CAPS Take 1 capsule by mouth 2 (two) times daily.     raloxifene  (EVISTA ) 60 MG tablet TAKE 1 TABLET (60 MG TOTAL) BY MOUTH DAILY. FOR BONE HEALTH. 90 tablet 2   SYMBICORT  160-4.5 MCG/ACT inhaler Inhale 2 puffs  into the lungs 2 (two) times daily. 3 each 4   benzonatate  (TESSALON ) 200 MG capsule Take 1 capsule (200 mg total) by mouth 3 (three) times daily as needed for cough. (Patient not taking: Reported on 12/14/2023) 30 capsule 0   doxycycline  (VIBRAMYCIN ) 100 MG capsule Take 1 capsule (100 mg total) by mouth 2 (two) times daily. (Patient not taking: Reported on 12/14/2023) 14 capsule 0   No current facility-administered medications on file prior to visit.    BP 138/84   Pulse (!) 102   Temp (!) 97.5 F (36.4 C) (Temporal)   Ht 4' 11.5 (1.511 m)   Wt 138 lb (62.6 kg)   SpO2 97%   BMI 27.41 kg/m  Objective:   Physical Exam Exam conducted with a chaperone present.  Cardiovascular:     Rate and Rhythm: Normal rate.  Pulmonary:     Effort: Pulmonary effort is normal.  Genitourinary:    Labia:        Right: No tenderness.        Left: No tenderness.      Vagina: No vaginal discharge or erythema.     Comments: Dry, irritated/erythematous skin to bilateral labia majora. Skin:    General: Skin is warm and dry.     Comments: 2-3 slightly raised, mildly erythematous bumps to the mid bilateral thoracic back.  No crusting or scabbing.  No obvious cellulitis.  Neurological:     Mental Status: She is alert.     Physical Exam        Assessment & Plan:  Vaginal itching Assessment & Plan: Differentials include vaginitis, vaginal vaginosis.  Wet prep collected today, await  results.   Orders: -     WET PREP BY MOLECULAR PROBE  Rash and nonspecific skin eruption Assessment & Plan: Exam today representative for mosquito bites, no obvious cellulitis.  Continue Xyzal  5 mg daily. Depo Medrol  80 mg provided in the office today IM. She approved.  Do expect this to heal properly without complication. Return precautions provided.   Hemorrhoids, unspecified hemorrhoid type Assessment & Plan: 1.5 cm oval shaped hemorrhoid noted to external rectum tender.  Nonthrombosed. Several smaller anal tags noted  Start Anusol  HC cream twice daily x 1 to 2 weeks. We also discussed sitz bath's, warm soaks  Orders: -     Hydrocortisone  (Perianal); Place 1 Application rectally 2 (two) times daily.  Dispense: 30 g; Refill: 0    Assessment and Plan Assessment & Plan         Comer MARLA Gaskins, NP   History of Present Illness

## 2023-12-14 NOTE — Addendum Note (Signed)
 Addended by: NARCISO ANDREZ BROCKS on: 12/14/2023 03:27 PM   Modules accepted: Orders

## 2023-12-14 NOTE — Assessment & Plan Note (Signed)
 Exam today representative for mosquito bites, no obvious cellulitis.  Continue Xyzal  5 mg daily. Depo Medrol  80 mg provided in the office today IM. She approved.  Do expect this to heal properly without complication. Return precautions provided.

## 2023-12-15 ENCOUNTER — Ambulatory Visit: Payer: Self-pay | Admitting: Primary Care

## 2023-12-15 DIAGNOSIS — B3731 Acute candidiasis of vulva and vagina: Secondary | ICD-10-CM

## 2023-12-15 LAB — WET PREP BY MOLECULAR PROBE
Candida species: DETECTED — AB
Gardnerella vaginalis: NOT DETECTED
MICRO NUMBER:: 16986066
SPECIMEN QUALITY:: ADEQUATE
Trichomonas vaginosis: NOT DETECTED

## 2023-12-15 MED ORDER — FLUCONAZOLE 150 MG PO TABS: 150.0000 mg | ORAL_TABLET | Freq: Once | ORAL | 0 refills | Status: AC

## 2023-12-15 NOTE — Telephone Encounter (Signed)
 Copied from CRM [id number]. Topic: Clinical - Medical Advice >> Dec 15, 2023  2:44 PM Harlene ORN wrote: Reason for CRM: Patient called to follow up on her appointment she had 12/14/2023. Pt has not yet received her medication, and wants to discuss her test results as well. Please call back the patient and advise.

## 2023-12-18 ENCOUNTER — Ambulatory Visit: Payer: Self-pay

## 2023-12-18 MED ORDER — FLUCONAZOLE 150 MG PO TABS: ORAL_TABLET | ORAL | 0 refills | Status: AC

## 2023-12-18 NOTE — Telephone Encounter (Signed)
 See MyChart message

## 2023-12-18 NOTE — Telephone Encounter (Signed)
Noted, will respond via Mychart.

## 2023-12-18 NOTE — Telephone Encounter (Signed)
 Did she take the fluconazole  pill that I sent to her pharmacy?  Did her itching improve at all after she took the pill, even if a little bit?

## 2023-12-18 NOTE — Telephone Encounter (Unsigned)
 Copied from CRM [id number]. Topic: Clinical - Medical Advice >> Dec 18, 2023  7:47 AM Mercedes MATSU wrote: Reason for CRM: Patient called in stating that her yeast infection is back, and she is very itchy and in pain. Patient is requesting either more medication or medical advice on how she should proceed next. I offered her an in person appointment but she declined. Patient can be reached at [phone] and she is requesting a STAT call back. >> Dec 18, 2023  4:18 PM Corin V wrote: Patient called back to follow up. She stated she did take the fluconazole  and it did help the itching Friday and Saturday but it was back on Sunday.

## 2023-12-18 NOTE — Telephone Encounter (Signed)
 FYI Only or Action Required?: Action required by provider: clinical question for provider and update on patient condition.  Patient was last seen in primary care on 12/14/2023 by Gretta Comer POUR, NP.  Called Nurse Triage reporting Vaginal Itching.  Symptoms began several days ago.  Interventions attempted: Prescription medications: fluconazole .  Symptoms are: unchanged.  Triage Disposition: Call PCP Within 24 Hours (overriding See Physician Within 24 Hours)  Patient/caregiver understands and will follow disposition?: Yes                             Copied from CRM [id number]. Topic: Clinical - Medical Advice >> Dec 18, 2023  7:47 AM Mercedes MATSU wrote: Reason for CRM: Patient called in stating that her yeast infection is back, and she is very itchy and in pain. Patient is requesting either more medication or medical advice on how she should proceed next. I offered her an in person appointment but she declined. Patient can be reached at [phone] and she is requesting a STAT call back.  Reason for Disposition  MODERATE-SEVERE itching (i.e., interferes with school, work, or sleep)  Answer Assessment - Initial Assessment Questions 1. SYMPTOM: What's the main symptom you're concerned about? (e.g., pain, itching, dryness)     Pain and itching 2. LOCATION: Where is the pain and itching located? (e.g., inside/outside, left/right)     External vagina 3. ONSET: When did the symptoms start?     Several days ago 4. PAIN: Is there any pain? If Yes, ask: How bad is it? (Scale: 1-10; mild, moderate, severe)     States pain was worse early this morning, but she is doing better with it now 5. ITCHING: Is there any itching? If Yes, ask: How bad is it? (Scale: 1-10; mild, moderate, severe)     Yes 6. CAUSE: What do you think is causing the discharge? Have you had the same problem before? What happened then?     Yeast infection  7. OTHER SYMPTOMS: Do  you have any other symptoms? (e.g., fever, itching, vaginal bleeding, pain with urination, injury to genital area, vaginal foreign body)     Denies discharge, denies bleeding, denies urinary symptoms    Patient was evaluated in office on 12/14/23. Patient stated she took the 1 dose of fluconazole  on Friday. Patient stated symptoms improved at first, but worsened again. Per note in chart from provider, please update me early next week if your symptoms have not improved. Patient would like to know how to proceed. This RN advised I would route conversation to office for provider's input. Please advise.  Protocols used: Vaginal Symptoms-A-AH

## 2023-12-20 ENCOUNTER — Other Ambulatory Visit: Payer: Self-pay

## 2023-12-20 DIAGNOSIS — R0602 Shortness of breath: Secondary | ICD-10-CM

## 2023-12-21 ENCOUNTER — Encounter: Payer: Self-pay | Admitting: Internal Medicine

## 2023-12-21 ENCOUNTER — Ambulatory Visit: Admitting: Internal Medicine

## 2023-12-21 VITALS — BP 100/60 | HR 105 | Temp 98.3°F | Ht 59.0 in | Wt 130.0 lb

## 2023-12-21 DIAGNOSIS — R0602 Shortness of breath: Secondary | ICD-10-CM

## 2023-12-21 DIAGNOSIS — J309 Allergic rhinitis, unspecified: Secondary | ICD-10-CM | POA: Diagnosis not present

## 2023-12-21 DIAGNOSIS — J4540 Moderate persistent asthma, uncomplicated: Secondary | ICD-10-CM | POA: Diagnosis not present

## 2023-12-21 DIAGNOSIS — J8283 Eosinophilic asthma: Secondary | ICD-10-CM | POA: Diagnosis not present

## 2023-12-21 LAB — PULMONARY FUNCTION TEST
DL/VA % pred: 89 %
DL/VA: 3.84 ml/min/mmHg/L
DLCO unc % pred: 77 %
DLCO unc: 12 ml/min/mmHg
FEF 25-75 Post: 0.86 L/s
FEF 25-75 Pre: 0.68 L/s
FEF2575-%Change-Post: 27 %
FEF2575-%Pred-Post: 83 %
FEF2575-%Pred-Pre: 65 %
FEV1-%Change-Post: 5 %
FEV1-%Pred-Post: 91 %
FEV1-%Pred-Pre: 87 %
FEV1-Post: 1.3 L
FEV1-Pre: 1.23 L
FEV1FVC-%Change-Post: 0 %
FEV1FVC-%Pred-Pre: 92 %
FEV6-%Change-Post: 5 %
FEV6-%Pred-Post: 106 %
FEV6-%Pred-Pre: 100 %
FEV6-Post: 1.92 L
FEV6-Pre: 1.82 L
FEV6FVC-%Change-Post: 0 %
FEV6FVC-%Pred-Post: 106 %
FEV6FVC-%Pred-Pre: 107 %
FVC-%Change-Post: 6 %
FVC-%Pred-Post: 99 %
FVC-%Pred-Pre: 93 %
FVC-Post: 1.93 L
FVC-Pre: 1.82 L
Post FEV1/FVC ratio: 67 %
Post FEV6/FVC ratio: 99 %
Pre FEV1/FVC ratio: 68 %
Pre FEV6/FVC Ratio: 100 %
RV % pred: 94 %
RV: 2.06 L
TLC % pred: 92 %
TLC: 3.98 L

## 2023-12-21 NOTE — Progress Notes (Signed)
 Full PFT completed today ? ?

## 2023-12-21 NOTE — Patient Instructions (Signed)
 Continue injection with Dupixent  Continue inhalers as prescribed Please rinse mouth after use  Avoid Allergens and Irritants Avoid secondhand smoke Avoid SICK contacts Recommend  Masking  when appropriate Recommend Keep up-to-date with vaccinations

## 2023-12-21 NOTE — Progress Notes (Signed)
 @Patient  ID: [name], female    DOB: 08-Jun-1940, 83 y.o.   MRN: [id number]   TEST/EVENTS :  Spirometry 12/07/11>>1.31 (71%), FEV1% 66 Exhaled nitric oxide  12/07/11>>8 ppb (normal) PFT 12/05/12 >> FEV1 1.49 (82%), FEV1% 69, FEF 25-75% 1.09 (69%), TLC 4.38 (85%), DLCO 87%, borderline BD response Rt pleural fluid 08/08/14 >> glucose 59, protein <3, LDH 6800, WBC 16,640 (86%N), cytology negative for malignancy Rt pleural fluid 08/28/14 >> glucose 59, LDH 178, protein 3.5, WBC 303 (68%L) RAST 06/21/17 >> dust mites, IgE 14 PFT 08/23/17 >> FEV1 1.21 (73%), FEV1% 65, TLC 4.02 (91%), DLCO 71%, no BD   Chest Imaging:  CT chest 08/02/14 >> RML consolidation, small Rt pleural effusion CT chest 08/09/14 >> Rt lower lobe thin walled cavity, scattered GGO and interlobular septal thickening CT chest 09/18/14 >> mild Rt pleural thickening, small Rt pleural effusion, 4 mm nodule LUL, improved RML consolidation, mild RML BTX, RLL cavity 2.5 cm from 3.1 cm, mild BTX lingula CT chest 10/16/14 >> decreased RLL PNA w/o cavity CT chest 04/27/16 >> 4 mm LUL nodule stable, mild BTX and scarring b/l    Cardiac Tests:  Lt heart cath 01/24/12 >> Normal coronaries, LVEDP 8 mmHg, EF 55% Echo 08/03/14 >> EF 50 to 55%, mild MR    83 year old female never smoker followed for 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 Mild bronchiectasis on CT chest, Started on Dupixent  in 2019.   CC Follow up ASTHMA  HPI Follow-up moderate persistent asthma with an allergic phenotype.  She says she does extremely well on her current regimen of Symbicort , Singulair  and Dupixent .  She says Dupixent  has made the biggest change in her life while order since starting her asthma has been so well-controlled.    Pulmonary function testing 2019 FEV1 FVC ratio postbronchodilator 65% predicted FEV1 73% predicted FVC 84% predicted No significant bronchodilator response TLC is 91% predicted RV/TLC ratio is 121% predicted DLCO  71% predicted Flow volume loops suggest  obstructive disease on the expiratory limb Overall interpretation moderate obstructive lung disease  Pulmonary function testing September 2025 FEV1 FVC ratio postbronchodilator is 67% predicted FEV1 91% predicted FVC 99% predicted No significant bronchodilator response FEF 2575 has significant bronchodilator response small obstructive airways disease TLC 92% predicted RV/TLC ratio 106% predicted DLCO 77% predicted Expiratory flow-volume loop on expiratory limb shows obstructive pattern Final interpretation mild obstructive lung disease FEV1 91% predicted   Patient is here for follow-up assessment for pulmonary function testing and interval assessment I believe that the pulmonary function testing has improved and FEV1 Classification of COPD is from moderate to mild Patient currently on Dupixent  therapy and inhalers  No exacerbation at this time No evidence of heart failure at this time No evidence or signs of infection at this time No respiratory distress No fevers, chills, nausea, vomiting, diarrhea No evidence of lower extremity edema No evidence hemoptysis  Allergies  Allergen Reactions   Fosamax [Alendronate Sodium] Nausea Only   Sulfonamide Derivatives Rash    Immunization History  Administered Date(s) Administered   Fluad Quad(high Dose 65+) 01/15/2019, 01/01/2020, 02/03/2022   INFLUENZA, HIGH DOSE SEASONAL PF 11/25/2013, 02/15/2021, 01/24/2023   Influenza Split 12/07/2011   Influenza Whole 03/11/2010   Influenza,inj,Quad PF,6+ Mos 12/26/2013, 12/02/2014, 01/05/2017, 01/16/2018   Influenza-Unspecified 12/07/2011   PFIZER(Purple Top)SARS-COV-2 Vaccination 04/20/2019, 05/12/2019, 01/27/2020   PNEUMOCOCCAL CONJUGATE-20 02/16/2023   Pneumococcal Conjugate-13 11/25/2013, 12/26/2013   Pneumococcal Polysaccharide-23 03/28/2004, 05/10/2006, 01/16/2018   Td 08/08/1999   Tdap 03/28/2009,  08/20/2009, 04/30/2021   Zoster  Recombinant(Shingrix) 08/11/2020, 12/14/2020   Zoster, Live 03/28/2009, 08/20/2009    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   Hearing loss    both ears, wwears hearing aides   Hyperlipidemia    Joint pain 08/06/2020   Laceration of head 05/07/2021   Lobar pneumonia 02/19/2018   Macular degeneration    Malnutrition of moderate degree 08/28/2014   Osteopenia    Pain of right lower extremity 06/21/2018   Peripheral neuralgia 08/18/2021   Pneumonia    Strain of cervical portion of both trapezius muscles 06/17/2022    Tobacco History: Social History   Tobacco Use  Smoking Status Never  Smokeless Tobacco Never   Counseling given: Not Answered   Outpatient Medications Prior to Visit  Medication Sig Dispense Refill   albuterol  (VENTOLIN  HFA) 108 (90 Base) MCG/ACT inhaler Inhale 2 puffs into the lungs every 6 (six) hours as needed for wheezing or shortness of breath. 3 each 3   benzonatate  (TESSALON ) 200 MG capsule Take 1 capsule (200 mg total) by mouth 3 (three) times daily as needed for cough. (Patient not taking: Reported on 12/14/2023) 30 capsule 0   Cholecalciferol (VITAMIN D ) 1000 UNITS capsule Take 1,000 Units by mouth daily with breakfast.      diclofenac  (VOLTAREN ) 75 MG EC tablet TAKE 1 TABLET BY MOUTH TWICE A DAY AS NEEDED FOR BACK PAIN 180 tablet 0   doxycycline  (VIBRAMYCIN ) 100 MG capsule Take 1 capsule (100 mg total) by mouth 2 (two) times daily. (Patient not taking: Reported on 12/14/2023) 14 capsule 0   DUPIXENT  300 MG/2ML prefilled syringe INJECT 300MG  (1 SYRINGE) SUBCUTANEOUSLY EVERY OTHER WEEK. 8 mL 3   fluconazole  (DIFLUCAN ) 150 MG tablet Take 1 tablet by mouth every 3 days for a total of 3 doses.  3 tablet 0   hydrocortisone  (ANUSOL -HC) 2.5 % rectal cream Place 1 Application rectally 2 (two) times daily. 30 g 0   levocetirizine (XYZAL ) 5 MG tablet TAKE 1 TABLET BY MOUTH EVERY DAY IN THE EVENING FOR ALLERGIES 90 tablet 2   montelukast  (SINGULAIR ) 10 MG tablet Take 1 tablet (10 mg total) by mouth at bedtime. For allergies and asthma 90 tablet 2   Multiple Vitamins-Minerals (MULTI FOR HER 50+) TABS Take 1 tablet by mouth daily.     Multiple Vitamins-Minerals (PRESERVISION/LUTEIN) CAPS Take 1 capsule by mouth 2 (two) times daily.     raloxifene  (EVISTA ) 60 MG tablet TAKE 1 TABLET (60 MG TOTAL) BY MOUTH DAILY. FOR BONE HEALTH. 90 tablet 2   SYMBICORT  160-4.5 MCG/ACT inhaler Inhale 2 puffs into the lungs 2 (two) times daily. 3 each 4   No facility-administered medications prior to visit.     BP 100/60   Pulse (!) 105   Temp 98.3 F (36.8 C)   Ht 4' 11 (1.499 m)   Wt 130 lb (59 kg)   SpO2 95%   BMI 26.26 kg/m      Review of Systems: Gen:  Denies  fever, sweats, chills weight loss  HEENT: Denies blurred vision, double vision, ear pain, eye pain, hearing loss, nose bleeds, sore throat Cardiac:  No dizziness, chest pain or heaviness,  chest tightness,edema, No JVD Resp:   No cough, -sputum production, -shortness of breath,-wheezing, -hemoptysis,  Other:  All other systems negative   Physical Examination:   General Appearance: No distress  EYES PERRLA, EOM intact.   NECK Supple, No JVD Pulmonary: normal breath sounds, No wheezing.  CardiovascularNormal S1,S2.  No m/r/g.   Abdomen: Benign, Soft, non-tender. Neurology UE/LE 5/5 strength, no focal deficits Ext pulses intact, cap refill intact ALL OTHER ROS ARE NEGATIVE  methylPREDNISolone  acetate (DEPO-MEDROL ) injection 80 mg     Date Action Dose Route User   12/14/2023 1524 Given 80 mg Intramuscular (Left Deltoid) Narciso Andrez BROCKS, CMA          Latest Ref Rng & Units 12/21/2023   10:42 AM 08/23/2017    8:43 AM  PFT  Results  FVC-Pre L 1.82  P 1.76   FVC-Predicted Pre % 93  P 79   FVC-Post L 1.93  P 1.87   FVC-Predicted Post % 99  P 84   Pre FEV1/FVC % % 68  P 67   Post FEV1/FCV % % 67  P 65   FEV1-Pre L 1.23  P 1.17   FEV1-Predicted Pre % 87  P 71   FEV1-Post L 1.30  P 1.21   DLCO uncorrected ml/min/mmHg 12.00  P 13.01   DLCO UNC% % 77  P 71   DLVA Predicted % 89  P 103   TLC L 3.98  P 4.02   TLC % Predicted % 92  P 91   RV % Predicted % 94  P 107     P Preliminary result      Assessment & Plan:   83 year old pleasant white female with Eosinophilic asthma (HCC) Moderate persistent asthma with an allergic phenotype.   Pulmonary function test suggest obstructive pattern current FEV1 is 91% predicted   Her symptoms are well-controlled with current regimen  maintenance regimen with Symbicort , Singulair  and Dupixent .   No exacerbation at this time No evidence of heart failure at this time No evidence or signs of infection at this time No respiratory distress No fevers, chills, nausea, vomiting, diarrhea No evidence of lower extremity edema No evidence hemoptysis Avoid Allergens and Irritants Avoid secondhand smoke Avoid SICK contacts Recommend  Masking  when appropriate Recommend Keep up-to-date with vaccinations  Allergic rhinitis Well-controlled on current regimen.  Continue current medications which were reviewed at length with patient today   Patient  satisfied with Plan of action and management. All questions answered   Follow up 6 months   I spent a total of 45 minutes dedicated to the care of this patient on the date of this encounter to include pre-visit review of records, face-to-face time with the patient discussing conditions above, post visit ordering of testing, clinical documentation with the electronic health record, making appropriate referrals as documented, and communicating necessary information to the patient's healthcare team.    The Patient requires high  complexity decision making for assessment and support, frequent evaluation and titration of therapies, application of advanced monitoring technologies and extensive interpretation of multiple databases.  Patient satisfied with Plan of action and management. All questions answered    Nickolas Alm Cellar, M.D.  [facility]  Medical Director [facility] Medical Director [facility] Cardio-Pulmonary Department

## 2023-12-21 NOTE — Patient Instructions (Signed)
 Full PFT completed today ? ?

## 2023-12-26 ENCOUNTER — Ambulatory Visit: Payer: Self-pay | Admitting: Internal Medicine

## 2024-01-04 ENCOUNTER — Other Ambulatory Visit: Payer: Self-pay

## 2024-01-09 ENCOUNTER — Other Ambulatory Visit (HOSPITAL_COMMUNITY): Payer: Self-pay

## 2024-01-15 ENCOUNTER — Other Ambulatory Visit (HOSPITAL_COMMUNITY): Payer: Self-pay

## 2024-01-15 ENCOUNTER — Other Ambulatory Visit: Payer: Self-pay

## 2024-01-15 NOTE — Progress Notes (Signed)
 Specialty Pharmacy Refill Coordination Note  [name] is a 83 y.o. female contacted today regarding refills of specialty medication(s) Dupilumab  (Dupixent )   Patient requested [name] at [facility] at [place] date: 01/16/24   Medication will be filled on 01/15/24.

## 2024-01-29 ENCOUNTER — Other Ambulatory Visit: Payer: Self-pay | Admitting: Adult Health

## 2024-01-29 ENCOUNTER — Other Ambulatory Visit: Payer: Self-pay

## 2024-01-29 DIAGNOSIS — J8283 Eosinophilic asthma: Secondary | ICD-10-CM

## 2024-01-29 MED ORDER — DUPIXENT 300 MG/2ML ~~LOC~~ SOSY
PREFILLED_SYRINGE | SUBCUTANEOUS | 5 refills | Status: AC
  Filled 2024-01-29: qty 4, fill #0
  Filled 2024-02-06: qty 4, 28d supply, fill #0
  Filled 2024-02-28: qty 4, 28d supply, fill #1
  Filled 2024-03-26: qty 4, 28d supply, fill #2
  Filled 2024-04-23: qty 4, 28d supply, fill #3

## 2024-01-29 NOTE — Telephone Encounter (Signed)
 Refill sent for DUPIXENT  to [facility]: [phone]   Dose: 300mg  Olmito and Olmito every 14 days   Last OV: 12/21/23 Provider: Dr. Isaiah  Next OV: due March 2026, not yet scheduled  [name], PharmD, BCPS Clinical Pharmacist  [facility]

## 2024-02-01 ENCOUNTER — Other Ambulatory Visit (HOSPITAL_COMMUNITY): Payer: Self-pay

## 2024-02-06 ENCOUNTER — Other Ambulatory Visit: Payer: Self-pay

## 2024-02-06 NOTE — Progress Notes (Signed)
 Specialty Pharmacy Refill Coordination Note  [name] is a 83 y.o. female contacted today regarding refills of specialty medication(s) Dupilumab  (Dupixent )   Patient requested Delivery   Delivery date: 02/08/24   Verified address: [address] [place]   Medication will be filled on: 02/07/24

## 2024-02-07 DIAGNOSIS — H353221 Exudative age-related macular degeneration, left eye, with active choroidal neovascularization: Secondary | ICD-10-CM | POA: Diagnosis not present

## 2024-02-17 ENCOUNTER — Ambulatory Visit
Admission: EM | Admit: 2024-02-17 | Discharge: 2024-02-17 | Disposition: A | Discharge status: Home / Self Care | Attending: Internal Medicine | Admitting: Internal Medicine

## 2024-02-17 DIAGNOSIS — L309 Dermatitis, unspecified: Secondary | ICD-10-CM

## 2024-02-17 DIAGNOSIS — B379 Candidiasis, unspecified: Secondary | ICD-10-CM | POA: Diagnosis not present

## 2024-02-17 MED ORDER — NYSTATIN 100000 UNIT/GM EX CREA: TOPICAL_CREAM | CUTANEOUS | 0 refills | Status: AC

## 2024-02-17 MED ORDER — HYDROCORTISONE 2.5 % EX LOTN: TOPICAL_LOTION | Freq: Two times a day (BID) | CUTANEOUS | 0 refills | Status: AC

## 2024-02-17 NOTE — ED Provider Notes (Signed)
 [facility]    CSN: [id number] Arrival date & time: 02/17/24  0818      History   Chief Complaint Chief Complaint  Patient presents with   Rash    HPI [name] is a 83 y.o. female.   83 year old female presents urgent care with complaints of an itchy rash on the right lateral neck and bilateral axillas.  She first noticed this at the beginning of the week.  The areas are not painful and are not blistered.  They are red and raised patch areas without blisters.  The areas are very itchy making it hard to sleep at night.  She denies any contact with any allergens.   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   Allergy  1980   Arthritis 2010   Asthma 1980   Cancer of breast (HCC)    33 years ago, left,    COVID-19 05/11/2021   Dysuria 10/03/2019   Exercise-induced asthma 04/19/2012   Hearing loss    both ears, wwears hearing aides   Hyperlipidemia    Joint pain 08/06/2020   Laceration of head 05/07/2021   Lobar pneumonia 02/19/2018   Macular degeneration    Malnutrition of moderate degree 08/28/2014   Osteopenia    Pain of right lower extremity 06/21/2018   Peripheral neuralgia 08/18/2021   Pneumonia    Strain of cervical portion of both trapezius muscles 06/17/2022    Patient Active Problem List   Diagnosis Date Noted   Vaginal itching 12/14/2023   Rash and nonspecific skin eruption 12/14/2023   Hemorrhoids 12/14/2023   Urinary incontinence 08/12/2022   Prediabetes 08/12/2022   Localized skin mass, lump, or swelling 03/22/2022   Arthritis, degenerative 08/18/2021   Lactose intolerance 08/18/2021   Personal history of malignant neoplasm of breast 08/18/2021   Chronic foot pain, right 08/10/2021   Lower  extremity weakness 05/07/2021   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   Preventative health care 06/05/2017   Chronic back pain 01/06/2017   Osteopenia 07/18/2016   Absolute anemia    Allergic rhinitis 04/19/2012   Hyperlipidemia 03/10/2010   Macular degeneration 03/10/2010   Moderate persistent asthma 03/10/2010    Past Surgical History:  Procedure Laterality Date   ABDOMINAL HYSTERECTOMY  1990   BREAST ENHANCEMENT SURGERY     BREAST SURGERY  1980   CESAREAN SECTION     x 2   COLONOSCOPY WITH PROPOFOL  N/A 04/21/2014   Procedure: COLONOSCOPY WITH PROPOFOL ;  Surgeon: Gladis MARLA Louder, MD;  Location: [facility];  Service: Endoscopy;  Laterality: N/A;   EYE SURGERY Bilateral march 2015 and june 2015   lens for cataracts   LEFT HEART CATHETERIZATION WITH CORONARY ANGIOGRAM N/A 01/24/2012   Procedure: LEFT HEART CATHETERIZATION WITH CORONARY ANGIOGRAM;  Surgeon: Wilbert JONELLE Bihari, MD;  Location: [facility];  Service: Cardiovascular;  Laterality: N/A;   MASTECTOMY  33 yrs ago   bilateral    OB History   No obstetric history on file.      Home Medications    Prior to Admission medications   Medication Sig Start Date End Date Taking? Authorizing Provider  hydrocortisone  2.5 % lotion Apply topically 2 (two) times daily. 02/17/24  Yes Teresa Almarie LABOR, PA-C  nystatin  cream (MYCOSTATIN ) Apply to affected area 2 times daily 02/17/24  Yes Chauntay Paszkiewicz A, PA-C  albuterol  (VENTOLIN  HFA) 108 (90 Base) MCG/ACT inhaler Inhale 2 puffs into the lungs every 6 (six) hours as needed for wheezing or shortness of breath. 06/05/23   Parrett, Madelin RAMAN, NP  benzonatate  (TESSALON ) 200 MG capsule Take 1 capsule (200 mg total) by mouth 3 (three) times daily as needed for cough. Patient not taking: Reported on 12/21/2023 12/06/23   Murrill, Samantha, FNP  Cholecalciferol (VITAMIN D ) 1000 UNITS capsule Take 1,000 Units by mouth daily with breakfast.     [provider]  diclofenac  (VOLTAREN ) 75 MG EC  tablet TAKE 1 TABLET BY MOUTH TWICE A DAY AS NEEDED FOR BACK PAIN 12/01/23   Clark, Katherine K, NP  doxycycline  (VIBRAMYCIN ) 100 MG capsule Take 1 capsule (100 mg total) by mouth 2 (two) times daily. Patient not taking: Reported on 12/21/2023 12/06/23   Iola Lukes, FNP  DUPIXENT  300 MG/2ML prefilled syringe INJECT 300MG  (1 SYRINGE) SUBCUTANEOUSLY EVERY OTHER WEEK. 01/29/24   Isaiah Scrivener, MD  fluconazole  (DIFLUCAN ) 150 MG tablet Take 1 tablet by mouth every 3 days for a total of 3 doses. 12/18/23   Clark, Katherine K, NP  hydrocortisone  (ANUSOL -HC) 2.5 % rectal cream Place 1 Application rectally 2 (two) times daily. 12/14/23   Clark, Katherine K, NP  levocetirizine (XYZAL ) 5 MG tablet TAKE 1 TABLET BY MOUTH EVERY DAY IN THE EVENING FOR ALLERGIES 09/24/23   Clark, Katherine K, NP  montelukast  (SINGULAIR ) 10 MG tablet Take 1 tablet (10 mg total) by mouth at bedtime. For allergies and asthma 06/05/23   Parrett, Madelin RAMAN, NP  Multiple Vitamins-Minerals (MULTI FOR HER 50+) TABS Take 1 tablet by mouth daily.    [provider]  Multiple Vitamins-Minerals (PRESERVISION/LUTEIN) CAPS Take 1 capsule by mouth 2 (two) times daily.    [provider]  raloxifene  (EVISTA ) 60 MG tablet TAKE 1 TABLET (60 MG TOTAL) BY MOUTH DAILY. FOR BONE HEALTH. 09/24/23   Clark, Katherine K, NP  SYMBICORT  160-4.5 MCG/ACT inhaler Inhale 2 puffs into the lungs 2 (two) times daily. 06/05/23   Parrett, Madelin RAMAN, NP    Family History Family History  Problem Relation Age of Onset   Macular degeneration Mother    Heart attack Father     Social History Social History   Tobacco Use   Smoking status: Never   Smokeless tobacco: Never  Vaping Use   Vaping status: Never Used  Substance Use Topics   Alcohol use: Not Currently    Comment: occasionally   Drug use: No     Allergies   Fosamax [alendronate sodium] and Sulfonamide derivatives   Review of Systems Review of Systems  Constitutional:  Negative for  chills and fever.  HENT:  Negative for ear pain and sore throat.   Eyes:  Negative for pain and visual disturbance.  Respiratory:  Negative for cough and shortness of breath.   Cardiovascular:  Negative for chest pain and palpitations.  Gastrointestinal:  Negative for abdominal pain and vomiting.  Genitourinary:  Negative for dysuria and hematuria.  Musculoskeletal:  Negative for arthralgias and back pain.  Skin:  Positive for rash. Negative for color change.  Neurological:  Negative for seizures and syncope.  All other systems reviewed and are negative.    Physical Exam Triage Vital Signs ED Triage Vitals  Encounter Vitals Group     BP 02/17/24 0838 128/84     Girls Systolic BP  Percentile --      Girls Diastolic BP Percentile --      Boys Systolic BP Percentile --      Boys Diastolic BP Percentile --      Pulse Rate 02/17/24 0838 82     Resp 02/17/24 0838 18     Temp 02/17/24 0838 98.2 F (36.8 C)     Temp Source 02/17/24 0838 Oral     SpO2 02/17/24 0838 97 %     Weight 02/17/24 0836 130 lb 1.1 oz (59 kg)     Height 02/17/24 0836 4' 11.5 (1.511 m)     Head Circumference --      Peak Flow --      Pain Score 02/17/24 0833 0     Pain Loc --      Pain Education --      Exclude from Growth Chart --    No data found.  Updated Vital Signs BP 128/84 (BP Location: Left Arm)   Pulse 82   Temp 98.2 F (36.8 C) (Oral)   Resp 18   Ht 4' 11.5 (1.511 m)   Wt 130 lb 1.1 oz (59 kg)   SpO2 97%   BMI 25.83 kg/m   Visual Acuity Right Eye Distance:   Left Eye Distance:   Bilateral Distance:    Right Eye Near:   Left Eye Near:    Bilateral Near:     Physical Exam Vitals and nursing note reviewed.  Constitutional:      General: She is not in acute distress.    Appearance: She is well-developed.  HENT:     Head: Normocephalic and atraumatic.  Eyes:     Conjunctiva/sclera: Conjunctivae normal.  Cardiovascular:     Rate and Rhythm: Normal rate.  Pulmonary:      Effort: Pulmonary effort is normal. No respiratory distress.  Abdominal:     Tenderness: There is no abdominal tenderness.  Musculoskeletal:        General: No swelling.     Cervical back: Neck supple.  Skin:    General: Skin is warm and dry.     Capillary Refill: Capillary refill takes less than 2 seconds.     Findings: Rash present. Rash is not vesicular.     Comments: Bilateral axillas and right neck with reddish raised patches of skin without blistering  Neurological:     Mental Status: She is alert.  Psychiatric:        Mood and Affect: Mood normal.      UC Treatments / Results  Labs (all labs ordered are listed, but only abnormal results are displayed) Labs Reviewed - No data to display  EKG   Radiology No results found.  Procedures Procedures (including critical care time)  Medications Ordered in UC Medications - No data to display  Initial Impression / Assessment and Plan / UC Course  I have reviewed the triage vital signs and the nursing notes.  Pertinent labs & imaging results that were available during my care of the patient were reviewed by me and considered in my medical decision making (see chart for details).     Candidiasis  Dermatitis   Symptoms and physical exam findings are most consistent with a candidal type rash versus a contact dermatitis.  This does not appear to be shingles as it is affecting both axillas and there are no blisters areas present.  The rash is also not painful.  We will treat this with a combination of hydrocortisone   combined with nystatin  twice daily for 7 days.  Use a mild soap and water to wash the areas twice daily.  If no improvement by the end of the week then return to urgent care or follow-up with your primary care provider.  Final Clinical Impressions(s) / UC Diagnoses   Final diagnoses:  Candidiasis  Dermatitis     Discharge Instructions      Symptoms and physical exam findings are most consistent with a  candidal type rash versus a contact dermatitis.  This does not appear to be shingles as it is affecting both axillas and there are no blisters areas present.  The rash is also not painful.  We will treat this with a combination of hydrocortisone  combined with nystatin  twice daily for 7 days.  Use a mild soap and water to wash the areas twice daily.  If no improvement by the end of the week then return to urgent care or follow-up with your primary care provider.     ED Prescriptions     Medication Sig Dispense Auth. Provider   nystatin  cream (MYCOSTATIN ) Apply to affected area 2 times daily 30 g Ciria Bernardini A, PA-C   hydrocortisone  2.5 % lotion Apply topically 2 (two) times daily. 59 mL Teresa Almarie LABOR, [place]      PDMP not reviewed this encounter.   Teresa Almarie LABOR, [place] 02/17/24 [phone]

## 2024-02-17 NOTE — ED Triage Notes (Signed)
 Patient reports having a rash that she believes it shingles. She noticed this rash at the beginning of the week but this is now more painful and extending down to chest and under arms.

## 2024-02-17 NOTE — Discharge Instructions (Addendum)
 Symptoms and physical exam findings are most consistent with a candidal type rash versus a contact dermatitis.  This does not appear to be shingles as it is affecting both axillas and there are no blisters areas present.  The rash is also not painful.  We will treat this with a combination of hydrocortisone  combined with nystatin  twice daily for 7 days.  Use a mild soap and water to wash the areas twice daily.  If no improvement by the end of the week then return to urgent care or follow-up with your primary care provider.

## 2024-02-28 ENCOUNTER — Other Ambulatory Visit: Payer: Self-pay

## 2024-02-28 ENCOUNTER — Other Ambulatory Visit (HOSPITAL_COMMUNITY): Payer: Self-pay

## 2024-02-28 NOTE — Progress Notes (Signed)
 Specialty Pharmacy Refill Coordination Note  [name] is a 83 y.o. female contacted today regarding refills of specialty medication(s) Dupilumab  (Dupixent )   Patient requested Delivery   Delivery date: 03/06/24   Verified address: [address] [place]   Medication will be filled on: 03/05/24

## 2024-02-29 ENCOUNTER — Other Ambulatory Visit: Payer: Self-pay

## 2024-03-05 ENCOUNTER — Other Ambulatory Visit: Payer: Self-pay

## 2024-03-07 ENCOUNTER — Other Ambulatory Visit: Payer: Self-pay

## 2024-03-07 ENCOUNTER — Other Ambulatory Visit: Payer: Self-pay | Admitting: Pharmacist

## 2024-03-07 NOTE — Progress Notes (Signed)
 Specialty Pharmacy Ongoing Clinical Assessment Note  [name] is a 83 y.o. female who is being followed by the specialty pharmacy service for RxSp Asthma/COPD   Patient's specialty medication(s) reviewed today: Dupilumab  (Dupixent )   Missed doses in the last 4 weeks: 0   Patient/Caregiver did not have any additional questions or concerns.   Therapeutic benefit summary: Patient is achieving benefit   Adverse events/side effects summary: No adverse events/side effects   Patient's therapy is appropriate to: Continue    Goals Addressed             This Visit's Progress    Maintain optimal adherence to therapy   On track    Patient is on track. Patient will maintain adherence, adhere to provider and/or lab appointments, and avoid flare triggers         Follow up: 12 months  Lyle LELON Chalk Specialty Pharmacist

## 2024-03-21 ENCOUNTER — Other Ambulatory Visit: Payer: Self-pay | Admitting: Adult Health

## 2024-03-21 DIAGNOSIS — J309 Allergic rhinitis, unspecified: Secondary | ICD-10-CM

## 2024-03-21 DIAGNOSIS — J4540 Moderate persistent asthma, uncomplicated: Secondary | ICD-10-CM

## 2024-03-26 ENCOUNTER — Other Ambulatory Visit (HOSPITAL_COMMUNITY): Payer: Self-pay

## 2024-03-26 NOTE — Progress Notes (Signed)
 Specialty Pharmacy Refill Coordination Note  [name] is a 83 y.o. female contacted today regarding refills of specialty medication(s) Dupilumab  (Dupixent )   Patient requested Delivery   Delivery date: 04/03/24   Verified address: [address] [name]   Medication will be filled on: 04/02/24

## 2024-04-23 ENCOUNTER — Other Ambulatory Visit: Payer: Self-pay | Admitting: Pharmacy Technician

## 2024-04-23 ENCOUNTER — Other Ambulatory Visit: Payer: Self-pay

## 2024-04-23 NOTE — Progress Notes (Signed)
 Specialty Pharmacy Refill Coordination Note  [name] is a 84 y.o. female contacted today regarding refills of specialty medication(s) Dupilumab  (Dupixent )   Patient requested Delivery   Delivery date: 05/01/24   Verified address: [address]  [place]   Medication will be filled on: 04/30/24

## 2024-04-30 ENCOUNTER — Other Ambulatory Visit: Payer: Self-pay
